# Patient Record
Sex: Female | Born: 1945 | Race: White | Hispanic: No | Marital: Married | Smoking: Former smoker
Health system: Southern US, Community
[De-identification: ages and names within clinical notes are randomized; demographics above are authoritative.]

## PROBLEM LIST (undated history)

## (undated) DIAGNOSIS — E669 Obesity, unspecified: Secondary | ICD-10-CM

## (undated) DIAGNOSIS — G4700 Insomnia, unspecified: Secondary | ICD-10-CM

## (undated) DIAGNOSIS — F32A Depression, unspecified: Secondary | ICD-10-CM

## (undated) DIAGNOSIS — M1990 Unspecified osteoarthritis, unspecified site: Secondary | ICD-10-CM

## (undated) DIAGNOSIS — E785 Hyperlipidemia, unspecified: Secondary | ICD-10-CM

## (undated) DIAGNOSIS — K760 Fatty (change of) liver, not elsewhere classified: Secondary | ICD-10-CM

## (undated) DIAGNOSIS — R011 Cardiac murmur, unspecified: Secondary | ICD-10-CM

## (undated) DIAGNOSIS — K219 Gastro-esophageal reflux disease without esophagitis: Secondary | ICD-10-CM

## (undated) DIAGNOSIS — C679 Malignant neoplasm of bladder, unspecified: Secondary | ICD-10-CM

## (undated) DIAGNOSIS — G2581 Restless legs syndrome: Secondary | ICD-10-CM

## (undated) DIAGNOSIS — R945 Abnormal results of liver function studies: Secondary | ICD-10-CM

## (undated) DIAGNOSIS — F329 Major depressive disorder, single episode, unspecified: Secondary | ICD-10-CM

## (undated) DIAGNOSIS — R51 Headache: Secondary | ICD-10-CM

## (undated) DIAGNOSIS — I10 Essential (primary) hypertension: Secondary | ICD-10-CM

## (undated) DIAGNOSIS — M8580 Other specified disorders of bone density and structure, unspecified site: Secondary | ICD-10-CM

## (undated) DIAGNOSIS — N200 Calculus of kidney: Secondary | ICD-10-CM

## (undated) DIAGNOSIS — F419 Anxiety disorder, unspecified: Secondary | ICD-10-CM

## (undated) DIAGNOSIS — E039 Hypothyroidism, unspecified: Secondary | ICD-10-CM

## (undated) DIAGNOSIS — R7989 Other specified abnormal findings of blood chemistry: Secondary | ICD-10-CM

## (undated) DIAGNOSIS — K635 Polyp of colon: Secondary | ICD-10-CM

## (undated) DIAGNOSIS — I4891 Unspecified atrial fibrillation: Secondary | ICD-10-CM

## (undated) HISTORY — DX: Depression, unspecified: F32.A

## (undated) HISTORY — DX: Major depressive disorder, single episode, unspecified: F32.9

## (undated) HISTORY — DX: Unspecified atrial fibrillation: I48.91

## (undated) HISTORY — PX: APPENDECTOMY: SHX54

## (undated) HISTORY — DX: Unspecified osteoarthritis, unspecified site: M19.90

## (undated) HISTORY — PX: FOOT SURGERY: SHX648

## (undated) HISTORY — DX: Insomnia, unspecified: G47.00

## (undated) HISTORY — DX: Gastro-esophageal reflux disease without esophagitis: K21.9

## (undated) HISTORY — DX: Obesity, unspecified: E66.9

## (undated) HISTORY — DX: Fatty (change of) liver, not elsewhere classified: K76.0

## (undated) HISTORY — DX: Essential (primary) hypertension: I10

## (undated) HISTORY — DX: Hypothyroidism, unspecified: E03.9

## (undated) HISTORY — DX: Other specified disorders of bone density and structure, unspecified site: M85.80

## (undated) HISTORY — DX: Restless legs syndrome: G25.81

## (undated) HISTORY — DX: Calculus of kidney: N20.0

## (undated) HISTORY — DX: Malignant neoplasm of bladder, unspecified: C67.9

## (undated) HISTORY — DX: Cardiac murmur, unspecified: R01.1

## (undated) HISTORY — PX: COLONOSCOPY: SHX174

## (undated) HISTORY — DX: Abnormal results of liver function studies: R94.5

## (undated) HISTORY — DX: Anxiety disorder, unspecified: F41.9

## (undated) HISTORY — DX: Hyperlipidemia, unspecified: E78.5

## (undated) HISTORY — DX: Other specified abnormal findings of blood chemistry: R79.89

---

## 1983-03-19 HISTORY — PX: TOTAL ABDOMINAL HYSTERECTOMY: SHX209

## 1998-03-18 DIAGNOSIS — C679 Malignant neoplasm of bladder, unspecified: Secondary | ICD-10-CM

## 1998-03-18 HISTORY — DX: Malignant neoplasm of bladder, unspecified: C67.9

## 1998-12-21 ENCOUNTER — Encounter: Admission: RE | Admit: 1998-12-21 | Discharge: 1998-12-26 | Payer: Self-pay | Admitting: Family Medicine

## 1999-01-16 ENCOUNTER — Other Ambulatory Visit: Admission: RE | Admit: 1999-01-16 | Discharge: 1999-01-16 | Payer: Self-pay | Admitting: Obstetrics and Gynecology

## 2000-01-21 ENCOUNTER — Other Ambulatory Visit: Admission: RE | Admit: 2000-01-21 | Discharge: 2000-01-21 | Payer: Self-pay | Admitting: Obstetrics and Gynecology

## 2000-09-01 ENCOUNTER — Ambulatory Visit (HOSPITAL_COMMUNITY): Admission: RE | Admit: 2000-09-01 | Discharge: 2000-09-01 | Payer: Self-pay | Admitting: Gastroenterology

## 2000-11-25 ENCOUNTER — Emergency Department (HOSPITAL_COMMUNITY): Admission: EM | Admit: 2000-11-25 | Discharge: 2000-11-25 | Payer: Self-pay | Admitting: Emergency Medicine

## 2001-02-17 ENCOUNTER — Other Ambulatory Visit: Admission: RE | Admit: 2001-02-17 | Discharge: 2001-02-17 | Payer: Self-pay | Admitting: Obstetrics and Gynecology

## 2002-03-23 ENCOUNTER — Other Ambulatory Visit: Admission: RE | Admit: 2002-03-23 | Discharge: 2002-03-23 | Payer: Self-pay | Admitting: Obstetrics and Gynecology

## 2002-10-04 ENCOUNTER — Inpatient Hospital Stay (HOSPITAL_COMMUNITY): Admission: AD | Admit: 2002-10-04 | Discharge: 2002-10-07 | Payer: Self-pay | Admitting: *Deleted

## 2003-04-08 ENCOUNTER — Other Ambulatory Visit: Admission: RE | Admit: 2003-04-08 | Discharge: 2003-04-08 | Payer: Self-pay | Admitting: Obstetrics and Gynecology

## 2003-10-17 ENCOUNTER — Ambulatory Visit (HOSPITAL_BASED_OUTPATIENT_CLINIC_OR_DEPARTMENT_OTHER): Admission: RE | Admit: 2003-10-17 | Discharge: 2003-10-17 | Payer: Self-pay | Admitting: Urology

## 2003-10-17 ENCOUNTER — Encounter (INDEPENDENT_AMBULATORY_CARE_PROVIDER_SITE_OTHER): Payer: Self-pay | Admitting: *Deleted

## 2003-10-17 ENCOUNTER — Observation Stay (HOSPITAL_COMMUNITY): Admission: AD | Admit: 2003-10-17 | Discharge: 2003-10-18 | Payer: Self-pay | Admitting: Urology

## 2003-10-17 HISTORY — PX: OTHER SURGICAL HISTORY: SHX169

## 2004-07-02 ENCOUNTER — Other Ambulatory Visit: Admission: RE | Admit: 2004-07-02 | Discharge: 2004-07-02 | Payer: Self-pay | Admitting: Obstetrics and Gynecology

## 2005-01-23 ENCOUNTER — Ambulatory Visit (HOSPITAL_COMMUNITY): Admission: RE | Admit: 2005-01-23 | Discharge: 2005-01-23 | Payer: Self-pay | Admitting: *Deleted

## 2009-01-10 ENCOUNTER — Encounter: Payer: Self-pay | Admitting: Internal Medicine

## 2009-01-31 ENCOUNTER — Encounter: Payer: Self-pay | Admitting: Internal Medicine

## 2009-01-31 DIAGNOSIS — G2581 Restless legs syndrome: Secondary | ICD-10-CM | POA: Insufficient documentation

## 2009-01-31 DIAGNOSIS — G4700 Insomnia, unspecified: Secondary | ICD-10-CM | POA: Insufficient documentation

## 2009-01-31 DIAGNOSIS — F411 Generalized anxiety disorder: Secondary | ICD-10-CM | POA: Insufficient documentation

## 2009-01-31 DIAGNOSIS — Z862 Personal history of diseases of the blood and blood-forming organs and certain disorders involving the immune mechanism: Secondary | ICD-10-CM | POA: Insufficient documentation

## 2009-01-31 DIAGNOSIS — E669 Obesity, unspecified: Secondary | ICD-10-CM | POA: Insufficient documentation

## 2009-01-31 DIAGNOSIS — F330 Major depressive disorder, recurrent, mild: Secondary | ICD-10-CM | POA: Insufficient documentation

## 2009-01-31 DIAGNOSIS — Z87442 Personal history of urinary calculi: Secondary | ICD-10-CM

## 2009-01-31 DIAGNOSIS — I10 Essential (primary) hypertension: Secondary | ICD-10-CM | POA: Insufficient documentation

## 2009-01-31 DIAGNOSIS — M8580 Other specified disorders of bone density and structure, unspecified site: Secondary | ICD-10-CM | POA: Insufficient documentation

## 2009-01-31 DIAGNOSIS — K219 Gastro-esophageal reflux disease without esophagitis: Secondary | ICD-10-CM | POA: Insufficient documentation

## 2009-01-31 DIAGNOSIS — Z780 Asymptomatic menopausal state: Secondary | ICD-10-CM | POA: Insufficient documentation

## 2009-01-31 DIAGNOSIS — M1990 Unspecified osteoarthritis, unspecified site: Secondary | ICD-10-CM | POA: Insufficient documentation

## 2009-01-31 DIAGNOSIS — E782 Mixed hyperlipidemia: Secondary | ICD-10-CM | POA: Insufficient documentation

## 2009-01-31 DIAGNOSIS — F329 Major depressive disorder, single episode, unspecified: Secondary | ICD-10-CM

## 2009-01-31 DIAGNOSIS — Z87448 Personal history of other diseases of urinary system: Secondary | ICD-10-CM | POA: Insufficient documentation

## 2009-01-31 DIAGNOSIS — E039 Hypothyroidism, unspecified: Secondary | ICD-10-CM | POA: Insufficient documentation

## 2009-01-31 DIAGNOSIS — Z8639 Personal history of other endocrine, nutritional and metabolic disease: Secondary | ICD-10-CM

## 2009-01-31 HISTORY — DX: Personal history of urinary calculi: Z87.442

## 2009-02-01 ENCOUNTER — Ambulatory Visit: Payer: Self-pay | Admitting: Internal Medicine

## 2009-02-01 DIAGNOSIS — I498 Other specified cardiac arrhythmias: Secondary | ICD-10-CM | POA: Insufficient documentation

## 2009-05-08 ENCOUNTER — Ambulatory Visit: Payer: Self-pay | Admitting: Internal Medicine

## 2009-05-26 ENCOUNTER — Ambulatory Visit: Payer: Self-pay | Admitting: Cardiology

## 2009-05-26 ENCOUNTER — Encounter (INDEPENDENT_AMBULATORY_CARE_PROVIDER_SITE_OTHER): Payer: Self-pay | Admitting: Cardiology

## 2009-05-26 LAB — CONVERTED CEMR LAB: POC INR: 3.5

## 2009-06-16 ENCOUNTER — Ambulatory Visit: Payer: Self-pay | Admitting: Cardiovascular Disease

## 2009-06-16 LAB — CONVERTED CEMR LAB: POC INR: 2.9

## 2009-06-28 ENCOUNTER — Ambulatory Visit: Payer: Self-pay | Admitting: Internal Medicine

## 2009-06-28 DIAGNOSIS — I4891 Unspecified atrial fibrillation: Secondary | ICD-10-CM | POA: Insufficient documentation

## 2009-07-14 ENCOUNTER — Ambulatory Visit (HOSPITAL_COMMUNITY): Admission: RE | Admit: 2009-07-14 | Discharge: 2009-07-14 | Payer: Self-pay | Admitting: Internal Medicine

## 2009-07-14 ENCOUNTER — Ambulatory Visit: Payer: Self-pay | Admitting: Cardiology

## 2009-07-14 ENCOUNTER — Ambulatory Visit: Payer: Self-pay | Admitting: Internal Medicine

## 2009-07-14 ENCOUNTER — Encounter: Payer: Self-pay | Admitting: Internal Medicine

## 2009-07-14 ENCOUNTER — Ambulatory Visit: Payer: Self-pay

## 2009-07-14 LAB — CONVERTED CEMR LAB: POC INR: 3

## 2009-07-31 ENCOUNTER — Ambulatory Visit: Payer: Self-pay | Admitting: Cardiovascular Disease

## 2009-07-31 LAB — CONVERTED CEMR LAB: POC INR: 3.1

## 2009-08-18 ENCOUNTER — Ambulatory Visit: Payer: Self-pay | Admitting: Cardiovascular Disease

## 2009-09-04 ENCOUNTER — Ambulatory Visit: Payer: Self-pay | Admitting: Cardiology

## 2009-09-04 ENCOUNTER — Ambulatory Visit: Payer: Self-pay | Admitting: Internal Medicine

## 2009-09-04 LAB — CONVERTED CEMR LAB: POC INR: 2.7

## 2009-10-02 ENCOUNTER — Ambulatory Visit: Payer: Self-pay | Admitting: Cardiology

## 2009-10-02 LAB — CONVERTED CEMR LAB: POC INR: 2.5

## 2009-10-30 ENCOUNTER — Ambulatory Visit: Payer: Self-pay | Admitting: Cardiology

## 2009-10-30 LAB — CONVERTED CEMR LAB: POC INR: 3.3

## 2009-11-21 ENCOUNTER — Ambulatory Visit: Payer: Self-pay | Admitting: Cardiovascular Disease

## 2009-11-21 LAB — CONVERTED CEMR LAB: POC INR: 3.3

## 2009-12-08 ENCOUNTER — Ambulatory Visit: Payer: Self-pay | Admitting: Internal Medicine

## 2009-12-08 LAB — CONVERTED CEMR LAB: POC INR: 2.7

## 2009-12-29 ENCOUNTER — Ambulatory Visit: Payer: Self-pay | Admitting: Cardiology

## 2009-12-29 LAB — CONVERTED CEMR LAB: POC INR: 2.9

## 2010-01-26 ENCOUNTER — Ambulatory Visit: Payer: Self-pay | Admitting: Cardiology

## 2010-01-26 LAB — CONVERTED CEMR LAB: POC INR: 2.1

## 2010-02-21 ENCOUNTER — Telehealth: Payer: Self-pay | Admitting: Internal Medicine

## 2010-02-23 ENCOUNTER — Ambulatory Visit: Payer: Self-pay | Admitting: Internal Medicine

## 2010-03-16 ENCOUNTER — Ambulatory Visit: Payer: Self-pay | Admitting: Cardiovascular Disease

## 2010-03-16 LAB — CONVERTED CEMR LAB: POC INR: 2.6

## 2010-03-18 HISTORY — PX: CARDIAC CATHETERIZATION: SHX172

## 2010-03-23 ENCOUNTER — Encounter: Payer: Self-pay | Admitting: Internal Medicine

## 2010-03-23 ENCOUNTER — Ambulatory Visit
Admission: RE | Admit: 2010-03-23 | Discharge: 2010-03-23 | Payer: Self-pay | Source: Home / Self Care | Attending: Internal Medicine | Admitting: Internal Medicine

## 2010-03-23 DIAGNOSIS — R079 Chest pain, unspecified: Secondary | ICD-10-CM | POA: Insufficient documentation

## 2010-03-23 LAB — CONVERTED CEMR LAB
BUN: 19 mg/dL (ref 6–23)
Basophils Absolute: 0 10*3/uL (ref 0.0–0.1)
Basophils Relative: 0 % (ref 0–1)
CO2: 29 meq/L (ref 19–32)
Calcium: 10.3 mg/dL (ref 8.4–10.5)
Chloride: 104 meq/L (ref 96–112)
Creatinine, Ser: 1.03 mg/dL (ref 0.40–1.20)
Eosinophils Absolute: 0.1 10*3/uL (ref 0.0–0.7)
Eosinophils Relative: 1 % (ref 0–5)
Glucose, Bld: 109 mg/dL — ABNORMAL HIGH (ref 70–99)
HCT: 41.9 % (ref 36.0–46.0)
Hemoglobin: 14 g/dL (ref 12.0–15.0)
INR: 2.3 — ABNORMAL HIGH (ref <1.50)
Lymphocytes Relative: 36 % (ref 12–46)
Lymphs Abs: 2.6 10*3/uL (ref 0.7–4.0)
MCHC: 33.4 g/dL (ref 30.0–36.0)
MCV: 83.1 fL (ref 78.0–100.0)
Monocytes Absolute: 0.7 10*3/uL (ref 0.1–1.0)
Monocytes Relative: 9 % (ref 3–12)
Neutro Abs: 3.8 10*3/uL (ref 1.7–7.7)
Neutrophils Relative %: 53 % (ref 43–77)
Platelets: 231 10*3/uL (ref 150–400)
Potassium: 4.1 meq/L (ref 3.5–5.3)
Prothrombin Time: 25.4 s — ABNORMAL HIGH (ref 11.6–15.2)
RBC: 5.04 M/uL (ref 3.87–5.11)
RDW: 14 % (ref 11.5–15.5)
Sodium: 142 meq/L (ref 135–145)
WBC: 7.2 10*3/uL (ref 4.0–10.5)
aPTT: 39 s — ABNORMAL HIGH (ref 24–37)

## 2010-03-26 ENCOUNTER — Ambulatory Visit
Admission: RE | Admit: 2010-03-26 | Discharge: 2010-03-26 | Payer: Self-pay | Source: Home / Self Care | Attending: Cardiology | Admitting: Cardiology

## 2010-04-02 ENCOUNTER — Ambulatory Visit: Admission: RE | Admit: 2010-04-02 | Discharge: 2010-04-02 | Payer: Self-pay | Source: Home / Self Care

## 2010-04-02 LAB — PROTIME-INR
INR: 1.08 (ref 0.00–1.49)
Prothrombin Time: 14.2 seconds (ref 11.6–15.2)

## 2010-04-02 LAB — CONVERTED CEMR LAB: POC INR: 1.6

## 2010-04-17 NOTE — Progress Notes (Signed)
Summary: chest pains  Phone Note Call from Patient Call back at Home Phone [phone]   Caller: Patient Summary of Call: chest pains Initial call taken by: Judie Grieve,  February 21, 2010 9:37 AM  Follow-up for Phone Call        Jennifer House calls today with chest discomfort radiating from the epigastric region to the neck and jaw extending into her back. At that time the pain was "8-9" out of 10 according to pt.  This occurred on Saturday while in bed.  She did feel short of breath and did notice her heart rate was rapid.  She says this lasted for a "short while" then went away.  She does not have any chest pain, angina, or sob at this time.  She understands to call 911 if this occurs again as she is having this more frequently according to patient.  She does not want to be seen today by PA. She only wished to make appt sometime with Dr. Johney Frame.  She does not feel this is an emergency.  Appt. made 03/23/10 at 3:45pm. Mylo Red RN

## 2010-04-17 NOTE — Medication Information (Signed)
Summary: rov/cb  Anticoagulant Therapy  Managed by: Bethena Midget, RN, BSN PCP: Lanier Ensign Supervising MD: Juanda Chance MD, Malcom Selmer Indication 1: Atrial Fibrillation Lab Used: [facility] [place] Site: [address] INR POC 3.0 INR RANGE 2 - 3  Dietary changes: no    Health status changes: no    Bleeding/hemorrhagic complications: no    Recent/future hospitalizations: no    Any changes in medication regimen? yes       Details: Tikosyn stopped at last MD Visit  Recent/future dental: no  Any missed doses?: no       Is patient compliant with meds? yes       Allergies: 1)  ! Asa 2)  ! * Boniva  Anticoagulation Management History:      The patient is taking warfarin and comes in today for a routine follow up visit.  Positive risk factors for bleeding include an age of 32 years or older.  The bleeding index is 'intermediate risk'.  Positive CHADS2 values include History of HTN.  Negative CHADS2 values include Age > 75 years old.  Anticoagulation responsible provider: Juanda Chance MD, Smitty Cords.  INR POC: 3.0.  Cuvette Lot#: [id number].  Exp: 08/2010.    Anticoagulation Management Assessment/Plan:      The patient's current anticoagulation dose is Warfarin sodium 5 mg tabs: Use as directed by Anticoagulation Clinic.  The next INR is due 07/31/2009.  Anticoagulation instructions were given to patient.  Results were reviewed/authorized by Bethena Midget, RN, BSN.  She was notified by Bethena Midget, RN, BSN.         Prior Anticoagulation Instructions: INR 2.9. Take 1.5 tablets daily except 1 tablet on Mon and Fri.  Current Anticoagulation Instructions: INR 3.0 Change dose 7.5mg  everyday except 5mg s on Mondays, Wednesdays and Fridays. Recheck in 2-3 weeks.

## 2010-04-17 NOTE — Assessment & Plan Note (Signed)
Summary: per check out/sf   Visit Type:  2 mo f/u Referring Provider:  Armanda Magic, MD Primary Provider:  Lanier Ensign  CC:  no cardiac complaints today.  History of Present Illness: The patient presents today for routine electrophysiology followup. She reports doing very well since last being seen in our clinic. She is unaware of episodes of afib.  She has had no further dizziness or presyncope.  The patient denies symptoms of palpitations, chest pain, shortness of breath, orthopnea, PND, lower extremity edema, dizziness, presyncope, syncope, or neurologic sequela. The patient is tolerating medications without difficulties and is otherwise without complaint today.   Current Medications (verified): 1)  Caltrate 600 1500 Mg Tabs (Calcium Carbonate) .... 3 Tablets Daily 2)  Centrum Silver  Tabs (Multiple Vitamins-Minerals) .... Daily 3)  Amlodipine Besy-Benazepril Hcl 10-20 Mg Caps (Amlodipine Besy-Benazepril Hcl) .... One Capsule Once Daily 4)  Pravachol 40 Mg Tabs (Pravastatin Sodium) .... One Tablet At Bedtime 5)  Fish Oil   Oil (Fish Oil) .... 1200mg  2 Tabs Two Times A Day 6)  Synthroid 75 Mcg Tabs (Levothyroxine Sodium) .... Take One Tablet By Mouth Once Daily. 7)  Ambien Cr 12.5 Mg Cr-Tabs (Zolpidem Tartrate) .... One Tablet By Mouth At Bedtime As Needed 8)  Vitamin D 1000 Unit Tabs (Cholecalciferol) .... 2 Tablets Daily 9)  Mobic 7.5 Mg Tabs (Meloxicam) .... Take One Tablet By Mouth Twice Daily. 10)  Warfarin Sodium 5 Mg Tabs (Warfarin Sodium) .... Use As Directed By Anticoagulation Clinic  Allergies: 1)  ! Asa 2)  ! Sandrea Hammond  Past History:  Past Medical History: Reviewed history from 02/01/2009 and no changes required. 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 10/17/2003  Cystoscopy, left stent placement, TURBT. TAH 1985 Foot surgery  Social History: Reviewed history from 02/01/2009 and no changes required. Lives in [place] with husband.    She is a housewife.   She denies tobacco, ETOH, drugs  Vital Signs:  Patient profile:   65 year old female Height:      64 inches Weight:      190 pounds BMI:     32.73 Pulse rate:   76 / minute Pulse rhythm:   regular BP sitting:   122 / 60  (left arm) Cuff size:   regular  Vitals Entered By: Danielle Rankin, CMA (September 04, 2009 8:56 AM)  Physical Exam  General:  Well developed, well nourished, in no acute distress. Head:  normocephalic and atraumatic Eyes:  PERRLA/EOM intact; conjunctiva and lids normal. Mouth:  Teeth, gums and palate normal. Oral mucosa normal. Neck:  Neck supple, no JVD. No masses, thyromegaly or abnormal cervical nodes. Lungs:  Clear bilaterally to auscultation and percussion. Heart:  iRRR, no m/r/g Abdomen:  Bowel sounds positive; abdomen soft and non-tender without masses, organomegaly, or hernias noted. No hepatosplenomegaly. Msk:  Back normal, normal gait. Muscle strength and tone normal. Pulses:  pulses normal in all 4 extremities Extremities:  No clubbing or cyanosis. Neurologic:  Alert and oriented x 3.   Impression & Recommendations:  Problem # 1:  ATRIAL FIBRILLATION (ICD-427.31) permanent afib,  rate controlled and asymptomatic she is appropriatley anticoagulated with coumadin  I have recommended that she avoid mobic if possible due to increased risks for bleeding, renal failure, and worsening htn with this medicine.  Problem # 2:   HYPERTENSION (ICD-401.9)  stable  Her updated medication list for this problem includes:    Amlodipine Besy-benazepril Hcl 10-20 Mg Caps (Amlodipine besy-benazepril hcl) ..... One capsule once daily  Patient Instructions: 1)  Your physician recommends that you schedule a follow-up appointment in: 12 months with Dr Johney Frame

## 2010-04-17 NOTE — Medication Information (Signed)
Summary: new coumadin eval was followed by [facility] physician  Anticoagulant Therapy  Managed by: Shelby Dubin, PharmD, BCPS, CPP PCP: Lanier Ensign Supervising MD: Antoine Poche MD, Fayrene Fearing Indication 1: Atrial Fibrillation INR POC 3.5 INR RANGE 2 - 3  Dietary changes: no    Health status changes: no    Bleeding/hemorrhagic complications: no    Recent/future hospitalizations: no    Any changes in medication regimen? no    Recent/future dental: no  Any missed doses?: no       Is patient compliant with meds? yes       Allergies (verified): 1)  ! Asa 2)  ! * Boniva  Anticoagulation Management History:      The patient is taking warfarin and comes in today for a routine follow up visit.  Positive risk factors for bleeding include an age of 67 years or older.  The bleeding index is 'intermediate risk'.  Positive CHADS2 values include History of HTN.  Negative CHADS2 values include Age > 67 years old.  Anticoagulation responsible provider: Antoine Poche MD, Fayrene Fearing.  INR POC: 3.5.  Cuvette Lot#: [id number].  Exp: 07/2010.    Anticoagulation Management Assessment/Plan:      The patient's current anticoagulation dose is Warfarin sodium 5 mg tabs: Use as directed by Anticoagulation Clinic.  The next INR is due 06/16/2009.  Anticoagulation instructions were given to patient.  Results were reviewed/authorized by Shelby Dubin, PharmD, BCPS, CPP.  She was notified by Shelby Dubin PharmD, BCPS, CPP.         Current Anticoagulation Instructions: INR 3.5  Skip 1 day then resume 1.5 tabs daily except 1 tab each Monday and Friday.  Recheck in 3 weeks.  Please call [phone] with questions.

## 2010-04-17 NOTE — Medication Information (Signed)
Summary: rov/cb  Anticoagulant Therapy  Managed by: Cloyde Reams, RN, BSN PCP: Lanier Ensign Supervising MD: Shirlee Latch MD, Ericca Labra Indication 1: Atrial Fibrillation Lab Used: [facility] [place] Site: [address] INR POC 2.7 INR RANGE 2 - 3  Dietary changes: no    Health status changes: no    Bleeding/hemorrhagic complications: no    Recent/future hospitalizations: no    Any changes in medication regimen? no    Recent/future dental: no  Any missed doses?: no       Is patient compliant with meds? yes       Allergies: 1)  ! Asa 2)  ! * Boniva  Anticoagulation Management History:      The patient is taking warfarin and comes in today for a routine follow up visit.  Positive risk factors for bleeding include an age of 67 years or older.  The bleeding index is 'intermediate risk'.  Positive CHADS2 values include History of HTN.  Negative CHADS2 values include Age > 61 years old.  Anticoagulation responsible provider: Shirlee Latch MD, Lillyauna Jenkinson.  INR POC: 2.7.  Cuvette Lot#: [id number].  Exp: 11/2010.    Anticoagulation Management Assessment/Plan:      The patient's current anticoagulation dose is Warfarin sodium 5 mg tabs: Use as directed by Anticoagulation Clinic.  The target INR is 2.0-3.0.  The next INR is due 10/02/2009.  Anticoagulation instructions were given to patient.  Results were reviewed/authorized by Cloyde Reams, RN, BSN.  She was notified by Cloyde Reams RN.         Prior Anticoagulation Instructions: INR 2.8. Take 1 tablet daily except 1.5 tablets on Tues, Thurs, Sun.  Recheck in 3 weeks.  Current Anticoagulation Instructions: INR 2.7  Continue on same dosage 1 tablet daily except 1.5 tablets on Sundays, Tuesdays, and Thursdays.  Recheck in 4 weeks.

## 2010-04-17 NOTE — Medication Information (Signed)
Summary: rov/ewj  Anticoagulant Therapy  Managed by: Weston Brass, PharmD PCP: Lanier Ensign Supervising MD: Antoine Poche MD, Fayrene Fearing Indication 1: Atrial Fibrillation Lab Used: [facility] [place] Site: [address] INR POC 2.5 INR RANGE 2 - 3  Dietary changes: no    Health status changes: no    Bleeding/hemorrhagic complications: no    Recent/future hospitalizations: no    Any changes in medication regimen? no    Recent/future dental: no  Any missed doses?: no       Is patient compliant with meds? yes       Allergies: 1)  ! Asa 2)  ! * Boniva  Anticoagulation Management History:      The patient is taking warfarin and comes in today for a routine follow up visit.  Positive risk factors for bleeding include an age of 65 years or older.  The bleeding index is 'intermediate risk'.  Positive CHADS2 values include History of HTN.  Negative CHADS2 values include Age > 25 years old.  Anticoagulation responsible provider: Antoine Poche MD, Fayrene Fearing.  INR POC: 2.5.  Cuvette Lot#: [id number].  Exp: 12/2010.    Anticoagulation Management Assessment/Plan:      The patient's current anticoagulation dose is Warfarin sodium 5 mg tabs: Use as directed by Anticoagulation Clinic.  The target INR is 2.0-3.0.  The next INR is due 10/30/2009.  Anticoagulation instructions were given to patient.  Results were reviewed/authorized by Weston Brass, PharmD.  She was notified by Dillard Cannon.         Prior Anticoagulation Instructions: INR 2.7  Continue on same dosage 1 tablet daily except 1.5 tablets on Sundays, Tuesdays, and Thursdays.  Recheck in 4 weeks.    Current Anticoagulation Instructions: INR 2.5  Continue same dose of 1.5 tabs on Sunday, Tuesday, and Thursday and 1 tab all other days.  Re-check in 4 weeks.

## 2010-04-17 NOTE — Medication Information (Signed)
Summary: rov/jm  Anticoagulant Therapy  Managed by: Weston Brass, PharmD PCP: Lanier Ensign Supervising MD: Tenny Craw MD, Gunnar Fusi Indication 1: Atrial Fibrillation Lab Used: [facility] [place] Site: [address] INR POC 2.7 INR RANGE 2 - 3  Dietary changes: no    Health status changes: no    Bleeding/hemorrhagic complications: no    Recent/future hospitalizations: no    Any changes in medication regimen? no    Recent/future dental: no  Any missed doses?: no       Is patient compliant with meds? yes       Allergies: 1)  ! Asa 2)  ! * Boniva  Anticoagulation Management History:      The patient is taking warfarin and comes in today for a routine follow up visit.  Positive risk factors for bleeding include an age of 67 years or older.  The bleeding index is 'intermediate risk'.  Positive CHADS2 values include History of HTN.  Negative CHADS2 values include Age > 67 years old.  Anticoagulation responsible provider: Tenny Craw MD, Gunnar Fusi.  INR POC: 2.7.  Cuvette Lot#: [id number].  Exp: 01/2011.    Anticoagulation Management Assessment/Plan:      The patient's current anticoagulation dose is Warfarin sodium 5 mg tabs: Use as directed by Anticoagulation Clinic.  The target INR is 2.0-3.0.  The next INR is due 12/29/2009.  Anticoagulation instructions were given to patient.  Results were reviewed/authorized by Weston Brass, PharmD.  She was notified by Kennieth Francois.         Prior Anticoagulation Instructions: INR 3.3  Hold today's dose, then take one tablet every day except one and one-half tablets on Sunday and Thursday.  We will see you in two weeks.  Current Anticoagulation Instructions: INR 2.7  Continue taking one tablet every day except for one and one-half tablets on Sunday and Thursday.  We will see you in three weeks.

## 2010-04-17 NOTE — Medication Information (Signed)
Summary: rov/jk  Anticoagulant Therapy  Managed by: Leota Sauers, PharmD, BCPS, CPP PCP: Lanier Ensign Supervising MD: Eden Emms MD, Theron Arista Indication 1: Atrial Fibrillation Lab Used: [facility] [place] Site: [address] INR POC 3.3 INR RANGE 2 - 3  Dietary changes: no    Health status changes: no    Bleeding/hemorrhagic complications: no    Recent/future hospitalizations: no    Any changes in medication regimen? no    Recent/future dental: no  Any missed doses?: no       Is patient compliant with meds? yes       Allergies: 1)  ! Asa 2)  ! * Boniva  Anticoagulation Management History:      Positive risk factors for bleeding include an age of 67 years or older.  The bleeding index is 'intermediate risk'.  Positive CHADS2 values include History of HTN.  Negative CHADS2 values include Age > 33 years old.  Anticoagulation responsible provider: Eden Emms MD, Theron Arista.  INR POC: 3.3.  Cuvette Lot#: [id number].  Exp: 12/2010.    Anticoagulation Management Assessment/Plan:      The patient's current anticoagulation dose is Warfarin sodium 5 mg tabs: Use as directed by Anticoagulation Clinic.  The target INR is 2.0-3.0.  The next INR is due 12/08/2009.  Anticoagulation instructions were given to patient.  Results were reviewed/authorized by Leota Sauers, PharmD, BCPS, CPP.  She was notified by Kennieth Francois.         Prior Anticoagulation Instructions: INR 3.3  Skip tonight's dose.  Then resume regular schedule of taking 1 tablet (5mg ) every day except take 1.5 tablets (7.5mg ) on Sundays, Tuesdays, and Thursdays.  Recheck in 3 weeks.   Current Anticoagulation Instructions: INR 3.3  Hold today's dose, then take one tablet every day except one and one-half tablets on Sunday and Thursday.  We will see you in two weeks.

## 2010-04-17 NOTE — Medication Information (Signed)
Summary: rov/jm  Anticoagulant Therapy  Managed by: Reina Fuse, PharmD PCP: Lanier Ensign Supervising MD: Riley Kill MD, Maisie Fus Indication 1: Atrial Fibrillation Lab Used: [facility] [place] Site: [address] INR POC 2.9 INR RANGE 2 - 3  Dietary changes: no    Health status changes: no    Bleeding/hemorrhagic complications: no    Recent/future hospitalizations: no    Any changes in medication regimen? no    Recent/future dental: no  Any missed doses?: no       Is patient compliant with meds? yes       Current Medications (verified): 1)  Caltrate 600 1500 Mg Tabs (Calcium Carbonate) .... 3 Tablets Daily 2)  Centrum Silver  Tabs (Multiple Vitamins-Minerals) .... Daily 3)  Amlodipine Besy-Benazepril Hcl 10-20 Mg Caps (Amlodipine Besy-Benazepril Hcl) .... One Capsule Once Daily 4)  Pravachol 40 Mg Tabs (Pravastatin Sodium) .... One Tablet At Bedtime 5)  Fish Oil   Oil (Fish Oil) .... 1200mg  2 Tabs Two Times A Day 6)  Synthroid 75 Mcg Tabs (Levothyroxine Sodium) .... Take One Tablet By Mouth Once Daily. 7)  Ambien Cr 12.5 Mg Cr-Tabs (Zolpidem Tartrate) .... One Tablet By Mouth At Bedtime As Needed 8)  Vitamin D 1000 Unit Tabs (Cholecalciferol) .... 2 Tablets Daily 9)  Mobic 7.5 Mg Tabs (Meloxicam) .... Take One Tablet By Mouth Twice Daily. 10)  Warfarin Sodium 5 Mg Tabs (Warfarin Sodium) .... Use As Directed By Anticoagulation Clinic  Allergies (verified): 1)  ! Asa 2)  ! * Boniva  Anticoagulation Management History:      The patient is taking warfarin and comes in today for a routine follow up visit.  Positive risk factors for bleeding include an age of 67 years or older.  The bleeding index is 'intermediate risk'.  Positive CHADS2 values include History of HTN.  Negative CHADS2 values include Age > 82 years old.  Anticoagulation responsible provider: Riley Kill MD, Maisie Fus.  INR POC: 2.9.  Cuvette Lot#: [id number].  Exp: 01/2011.    Anticoagulation Management  Assessment/Plan:      The patient's current anticoagulation dose is Warfarin sodium 5 mg tabs: Use as directed by Anticoagulation Clinic.  The target INR is 2.0-3.0.  The next INR is due 01/26/2010.  Anticoagulation instructions were given to patient.  Results were reviewed/authorized by Reina Fuse, PharmD.  She was notified by Reina Fuse PharmD.         Prior Anticoagulation Instructions: INR 2.7  Continue taking one tablet every day except for one and one-half tablets on Sunday and Thursday.  We will see you in three weeks.    Current Anticoagulation Instructions: INR 2.9  Continue taking Coumadin 5 mg (1 tab) on Mon, Tues, Wed, Fri, Sat and Coumadin 7.5 mg (1.5 tab) on Sundays and Thursdays. Return to clinic in 4 weeks.

## 2010-04-17 NOTE — Medication Information (Signed)
Summary: rov/cb  Anticoagulant Therapy  Managed by: Elaina Pattee, PharmD PCP: Lanier Ensign Supervising MD: Excell Seltzer MD, Casimiro Needle Indication 1: Atrial Fibrillation Lab Used: [facility] [place] Site: [address] INR RANGE 2 - 3  Dietary changes: no    Health status changes: no    Bleeding/hemorrhagic complications: no    Recent/future hospitalizations: no    Any changes in medication regimen? no    Recent/future dental: no  Any missed doses?: no       Is patient compliant with meds? yes       Allergies: 1)  ! Asa 2)  ! * Boniva  Anticoagulation Management History:      The patient is taking warfarin and comes in today for a routine follow up visit.  Positive risk factors for bleeding include an age of 67 years or older.  The bleeding index is 'intermediate risk'.  Positive CHADS2 values include History of HTN.  Negative CHADS2 values include Age > 75 years old.  Anticoagulation responsible provider: Excell Seltzer MD, Casimiro Needle.  Cuvette Lot#: [id number].  Exp: 10/2010.    Anticoagulation Management Assessment/Plan:      The patient's current anticoagulation dose is Warfarin sodium 5 mg tabs: Use as directed by Anticoagulation Clinic.  The next INR is due 09/04/2009.  Anticoagulation instructions were given to patient.  Results were reviewed/authorized by Elaina Pattee, PharmD.  She was notified by Elaina Pattee, PharmD.         Prior Anticoagulation Instructions: INR-3.1 Take 1/2 a tablet today. Patient to take 1 tablet Monday, Wednesday , Friday and Saturday. Take 1.5 tablets on all other days of each week. Return in 2 weeks.   Current Anticoagulation Instructions: INR 2.8. Take 1 tablet daily except 1.5 tablets on Tues, Thurs, Sun.  Recheck in 3 weeks.

## 2010-04-17 NOTE — Medication Information (Signed)
Summary: rov/sl  Anticoagulant Therapy  Managed by: Weston Brass, PharmD PCP: Lanier Ensign Supervising MD: Jens Som MD, Arlys John Indication 1: Atrial Fibrillation Lab Used: [facility] [place] Site: [address] INR POC 2.1 INR RANGE 2 - 3  Dietary changes: no    Health status changes: no    Bleeding/hemorrhagic complications: no    Recent/future hospitalizations: no    Any changes in medication regimen? no    Recent/future dental: no  Any missed doses?: no       Is patient compliant with meds? yes       Allergies (verified): 1)  ! Asa 2)  ! * Boniva  Anticoagulation Management History:      The patient is taking warfarin and comes in today for a routine follow up visit.  Positive risk factors for bleeding include an age of 67 years or older.  The bleeding index is 'intermediate risk'.  Positive CHADS2 values include History of HTN.  Negative CHADS2 values include Age > 39 years old.  Anticoagulation responsible provider: Jens Som MD, Arlys John.  INR POC: 2.1.  Cuvette Lot#: [id number].  Exp: 02/2011.    Anticoagulation Management Assessment/Plan:      The patient's current anticoagulation dose is Warfarin sodium 5 mg tabs: Use as directed by Anticoagulation Clinic.  The target INR is 2.0-3.0.  The next INR is due 02/23/2010.  Anticoagulation instructions were given to patient.  Results were reviewed/authorized by Weston Brass, PharmD.  She was notified by Hoy Register, PharmD Candidate.         Prior Anticoagulation Instructions: INR 2.9  Continue taking Coumadin 5 mg (1 tab) on Mon, Tues, Wed, Fri, Sat and Coumadin 7.5 mg (1.5 tab) on Sundays and Thursdays. Return to clinic in 4 weeks.   Current Anticoagulation Instructions: INR 2.1 Continue previous dose 1 tablet everyday except 1.5 tablets on Sunday and Thursday Recheck INR in 4 weeks

## 2010-04-17 NOTE — Assessment & Plan Note (Signed)
Summary: 2 month rov   Visit Type:  Follow-up Referring Provider:  Armanda Magic, MD Primary Provider:  Lanier Ensign   History of Present Illness: The patient presents today for routine electrophysiology followup. She reports doing very well since last being seen in our clinic. She is unaware of episodes of afib.  She has had no further dizziness or presyncope.  The patient denies symptoms of palpitations, chest pain, shortness of breath, orthopnea, PND, lower extremity edema, dizziness, presyncope, syncope, or neurologic sequela. The patient is tolerating medications without difficulties and is otherwise without complaint today.   Current Medications (verified): 1)  Tikosyn 500 Mcg Caps (Dofetilide) .... Take One Tablet By Mouth Once Two Times A Day 2)  Caltrate 600 1500 Mg Tabs (Calcium Carbonate) .... 3 Tablets Daily 3)  Centrum Silver  Tabs (Multiple Vitamins-Minerals) .... Daily 4)  Amlodipine Besy-Benazepril Hcl 10-20 Mg Caps (Amlodipine Besy-Benazepril Hcl) .... One Capsule Once Daily 5)  Pravachol 40 Mg Tabs (Pravastatin Sodium) .... One Tablet At Bedtime 6)  Fish Oil   Oil (Fish Oil) .... 1200mg  2 Tabs Two Times A Day 7)  Synthroid 75 Mcg Tabs (Levothyroxine Sodium) .... Take One Tablet By Mouth Once Daily. 8)  Ambien Cr 12.5 Mg Cr-Tabs (Zolpidem Tartrate) .... One Tablet By Mouth At Bedtime As Needed 9)  Vitamin D 1000 Unit Tabs (Cholecalciferol) .... 2 Tablets Daily 10)  Mobic 7.5 Mg Tabs (Meloxicam) .... Take One Tablet By Mouth Twice Daily. 11)  Warfarin Sodium 5 Mg Tabs (Warfarin Sodium) .... Use As Directed By Anticoagulation Clinic  Allergies: 1)  ! Asa 2)  ! Sandrea Hammond  Past History:  Past Medical History: Reviewed history from 02/01/2009 and no changes required. 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 10/17/2003  Cystoscopy, left stent placement, TURBT. TAH 1985 Foot surgery  Social History: Reviewed history from 02/01/2009 and no changes required. Lives in [place] with husband.    She is a housewife.   She denies tobacco, ETOH, drugs  Review of Systems       All systems are reviewed and negative except as listed in the HPI.   Vital Signs:  Patient profile:   65 year old female Height:      64 inches Weight:      186 pounds BMI:     32.04 Pulse rate:   79 / minute BP sitting:   130 / 70  (left arm)  Vitals Entered By: Laurance Flatten CMA (June 28, 2009 9:26 AM)  Physical Exam  General:  Well developed, well nourished, in no acute distress. Head:  normocephalic and atraumatic Eyes:  PERRLA/EOM intact; conjunctiva and lids normal. Mouth:  Teeth, gums and palate normal. Oral mucosa normal. Neck:  Neck supple, no JVD. No masses, thyromegaly or abnormal cervical nodes. Lungs:  Clear bilaterally to auscultation and percussion. Heart:  iRRR, no m/r/g Abdomen:  Bowel sounds positive; abdomen soft and non-tender without masses, organomegaly, or hernias noted. No hepatosplenomegaly. Msk:  Back normal, normal gait. Muscle strength and tone normal. Pulses:  pulses normal in all 4 extremities Extremities:  No clubbing or cyanosis. Neurologic:  Alert and oriented x 3. Skin:  Intact  without lesions or rashes. Cervical Nodes:  no significant adenopathy Psych:  Normal affect.   EKG  Procedure date:  06/28/2009  Findings:      afib, V rate 70, LAD  Impression & Recommendations:  Problem # 1:  ATRIAL FIBRILLATION  (ICD-427.31) The patient has persistent afib, without symptoms.  Her ventricular rates are controlled and she is on coumadin for stroke prevention. Therapies for afib were again discused at length with her.  SHe is clear that her symptoms are minimal and she does not wish to persue cardioversion or aggressive rhythm control strategies at this time.  I will therefore stop tikosyn today.  She has a h/o increased LFTs and abnl TFTs and would therefore not be a candidate for amiodarone.  I would avoid sotalol or multaq due to prior bradycardia. She will return in 2 months.  We will obtain an echo in the interim.  Problem # 2:  BRADYCARDIA (ICD-427.89) stable, without symptoms she has previously declined pacemaker implantation  Other Orders: Echocardiogram (Echo)  Patient Instructions: 1)  Your physician recommends that you schedule a follow-up appointment in: 2 months with Dr Johney Frame 2)  Your physician has recommended you make the following change in your medication: stop Tikosyn 3)  Your physician has requested that you have an echocardiogram.  Echocardiography is a painless test that uses sound waves to create images of your heart. It provides your doctor with information about the size and shape of your heart and how well your heart's chambers and valves are working.  This procedure takes approximately one hour. There are no restrictions for this procedure.

## 2010-04-17 NOTE — Assessment & Plan Note (Signed)
Summary: 3 month rov/sl   Visit Type:  Follow-up Referring Provider:  Armanda Magic, MD Primary Provider:  Lanier Ensign   History of Present Illness: The patient presents today for routine electrophysiology followup. She reports doing very well since last being seen in our clinic. She is unaware of episodes of afib.  The patient denies symptoms of palpitations, chest pain, shortness of breath, orthopnea, PND, lower extremity edema, dizziness, presyncope, syncope, or neurologic sequela. The patient is tolerating medications without difficulties and is otherwise without complaint today.   Current Medications (verified): 1)  Tikosyn 500 Mcg Caps (Dofetilide) .... Take One Tablet By Mouth Once Two Times A Day 2)  Caltrate 600 1500 Mg Tabs (Calcium Carbonate) .... 3 Tablets Daily 3)  Centrum Silver  Tabs (Multiple Vitamins-Minerals) .... Daily 4)  Amlodipine Besy-Benazepril Hcl 10-20 Mg Caps (Amlodipine Besy-Benazepril Hcl) .... One Capsule Once Daily 5)  Pravachol 40 Mg Tabs (Pravastatin Sodium) .... One Tablet At Bedtime 6)  Fish Oil   Oil (Fish Oil) .... 1200mg  2 Tabs Two Times A Day 7)  Synthroid 75 Mcg Tabs (Levothyroxine Sodium) .... Take One Tablet By Mouth Once Daily. 8)  Ambien Cr 12.5 Mg Cr-Tabs (Zolpidem Tartrate) .... One Tablet By Mouth At Bedtime As Needed 9)  Vitamin D 1000 Unit Tabs (Cholecalciferol) .... 2 Tablets Daily 10)  Mobic 7.5 Mg Tabs (Meloxicam) .... Take One Tablet By Mouth Twice Daily. 11)  Warfarin Sodium 5 Mg Tabs (Warfarin Sodium) .... Use As Directed By Anticoagulation Clinic  Allergies: 1)  ! Asa 2)  ! Sandrea Hammond  Past History:  Past Medical History: Reviewed history from 02/01/2009 and no changes required. 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 10/17/2003  Cystoscopy, left stent placement, TURBT. TAH 1985 Foot surgery  Social History: Reviewed history from 02/01/2009 and no changes required. Lives in [place] with husband.    She is a housewife.   She denies tobacco, ETOH, drugs  Review of Systems       All systems are reviewed and negative except as listed in the HPI.   Vital Signs:  Patient profile:   65 year old female Height:      64 inches Weight:      184 pounds BMI:     31.70 Pulse rate:   86 / minute BP sitting:   130 / 80  (left arm)  Vitals Entered By: Laurance Flatten CMA (May 08, 2009 10:55 AM)  Physical Exam  General:  Well developed, well nourished, in no acute distress. Head:  normocephalic and atraumatic Eyes:  PERRLA/EOM intact; conjunctiva and lids normal. Mouth:  Teeth, gums and palate normal. Oral mucosa normal. Neck:  Neck supple, no JVD. No masses, thyromegaly or abnormal cervical nodes. Lungs:  Clear bilaterally to auscultation and percussion. Heart:  iRRR, no m/r/g Abdomen:  Bowel sounds positive; abdomen soft and non-tender without masses, organomegaly, or hernias noted. No hepatosplenomegaly. Msk:  Back normal, normal gait. Muscle strength and tone normal. Pulses:  pulses normal in all 4 extremities Extremities:  No clubbing or cyanosis. Neurologic:  Alert and oriented x 3. Skin:  Intact without lesions or rashes. Psych:  Normal affect.  EKG  Procedure date:  05/08/2009  Findings:      afib V rate 86 bpm, otherwise normal ekg,  QTc 457  Impression & Recommendations:  Problem # 1:  ATRIAL FIBRILLATION, PAROXYSMAL (ICD-427.31) The patient presents for  follow-up of symptomatic afib and atrial flutter.  Interestingly, she presents today in afib but reports no recent symptoms of afib.  I suspect that she has much more afib than she is aware. She does not want to change antiarrhythmics today and continues to decline ablation.  Given that she feels so well, I think that this is a reasonable plan.  I will see her back in 2 months.  If she continues to be in persistent afib with minimal symptoms, then I will likely stop tikosyn and plan a rate control strategy longterm.  The importance of coumadin longterm was also stressed today. We discussed pradaxa as an alternative to coumadin.  The patient will continue coumadin at this time.  Her updated medication list for this problem includes:    Tikosyn 500 Mcg Caps (Dofetilide) .Marland Kitchen... Take one tablet by mouth once two times a day    Warfarin Sodium 5 Mg Tabs (Warfarin sodium) ..... Use as directed by anticoagulation clinic  Problem # 2:  BRADYCARDIA (ICD-427.89) The patient has denies any further symptoms of sinus bradycardia.  She denies syncope. We discussed treatments including ablation of atrial arrhythmias (as above) and possibly pacemaker implantation.  The patient declines both options today.  She will therefore contact my office if worsening bradycardia symptoms or syncope occur.  Her updated medication list for this problem includes:    Tikosyn 500 Mcg Caps (Dofetilide) .Marland Kitchen... Take one tablet by mouth once two times a day    Amlodipine Besy-benazepril Hcl 10-20 Mg Caps (Amlodipine besy-benazepril hcl) ..... One capsule once daily    Warfarin Sodium 5 Mg Tabs (Warfarin sodium) ..... Use as directed by anticoagulation clinic  Problem # 3:  HYPERTENSION (ICD-401.9) stable no changes  Her updated medication list for this problem includes:    Amlodipine Besy-benazepril Hcl 10-20 Mg Caps (Amlodipine besy-benazepril hcl) ..... One capsule once daily  Other Orders: EKG w/ Interpretation  (93000)  Patient Instructions: 1)  Your physician recommends that you schedule a follow-up appointment in: 2 months with Dr Johney Frame 2)  You have been referred to our [facility] clinic for a-fib is being followed by [facility] now 3)  Pradaxa-look up medication and check on cost  Appended Document: Orders Update    Clinical Lists Changes  Orders: Added new Referral order of [facility]. Coumadin Clinic Referral (Coumadin clinic) - Signed

## 2010-04-17 NOTE — Medication Information (Signed)
Summary: rov/tm  Anticoagulant Therapy  Managed by: Weston Brass, PharmD PCP: Lanier Ensign Supervising MD: Clifton James MD, Cristal Deer Indication 1: Atrial Fibrillation Lab Used: [facility] [place] Site: [address] INR POC 3.1 INR RANGE 2 - 3  Dietary changes: no    Health status changes: no    Bleeding/hemorrhagic complications: no    Recent/future hospitalizations: no    Any changes in medication regimen? no    Recent/future dental: no  Any missed doses?: no       Is patient compliant with meds? yes       Allergies: 1)  ! Asa 2)  ! * Boniva  Anticoagulation Management History:      The patient is taking warfarin and comes in today for a routine follow up visit.  Positive risk factors for bleeding include an age of 82 years or older.  The bleeding index is 'intermediate risk'.  Positive CHADS2 values include History of HTN.  Negative CHADS2 values include Age > 36 years old.  Anticoagulation responsible provider: Clifton James MD, Cristal Deer.  INR POC: 3.1.  Cuvette Lot#: [id number].  Exp: 10/2010.    Anticoagulation Management Assessment/Plan:      The patient's current anticoagulation dose is Warfarin sodium 5 mg tabs: Use as directed by Anticoagulation Clinic.  The next INR is due 08/18/2009.  Anticoagulation instructions were given to patient.  Results were reviewed/authorized by Weston Brass, PharmD.         Prior Anticoagulation Instructions: INR 3.0 Change dose 7.5mg  everyday except 5mg s on Mondays, Wednesdays and Fridays. Recheck in 2-3 weeks.   Current Anticoagulation Instructions: INR-3.1 Take 1/2 a tablet today. Patient to take 1 tablet Monday, Wednesday , Friday and Saturday. Take 1.5 tablets on all other days of each week. Return in 2 weeks.

## 2010-04-17 NOTE — Medication Information (Signed)
Summary: rov.mp  Anticoagulant Therapy  Managed by: Elaina Pattee, PharmD PCP: Lanier Ensign Supervising MD: Eden Emms MD, Theron Arista Indication 1: Atrial Fibrillation INR POC 2.9 INR RANGE 2 - 3  Dietary changes: yes       Details: Has started eating salads (1/2-1 cup) every 2-3 days.  Health status changes: no    Bleeding/hemorrhagic complications: no    Recent/future hospitalizations: no    Any changes in medication regimen? no    Recent/future dental: no  Any missed doses?: no       Is patient compliant with meds? yes       Allergies: 1)  ! Asa 2)  ! * Boniva  Anticoagulation Management History:      The patient is taking warfarin and comes in today for a routine follow up visit.  Positive risk factors for bleeding include an age of 67 years or older.  The bleeding index is 'intermediate risk'.  Positive CHADS2 values include History of HTN.  Negative CHADS2 values include Age > 60 years old.  Anticoagulation responsible provider: Eden Emms MD, Theron Arista.  INR POC: 2.9.  Cuvette Lot#: [id number].  Exp: 07/2010.    Anticoagulation Management Assessment/Plan:      The patient's current anticoagulation dose is Warfarin sodium 5 mg tabs: Use as directed by Anticoagulation Clinic.  The next INR is due 07/14/2009.  Anticoagulation instructions were given to patient.  Results were reviewed/authorized by Elaina Pattee, PharmD.  She was notified by Elaina Pattee, PharmD.         Prior Anticoagulation Instructions: INR 3.5  Skip 1 day then resume 1.5 tabs daily except 1 tab each Monday and Friday.  Recheck in 3 weeks.  Please call [phone] with questions.   Current Anticoagulation Instructions: INR 2.9. Take 1.5 tablets daily except 1 tablet on Mon and Fri.

## 2010-04-17 NOTE — Letter (Signed)
Summary: Handout Printed  Printed Handout:  - Coumadin Instructions-w/out Meds 

## 2010-04-17 NOTE — Medication Information (Signed)
Summary: rov/ln  Anticoagulant Therapy  Managed by: Reina Fuse, PharmD PCP: Lanier Ensign Supervising MD: Shirlee Latch MD, [place] Indication 1: Atrial Fibrillation Lab Used: [facility] [place] Site: [address] INR POC 3.3 INR RANGE 2 - 3  Dietary changes: no    Health status changes: no    Bleeding/hemorrhagic complications: no    Recent/future hospitalizations: no    Any changes in medication regimen? no    Recent/future dental: no  Any missed doses?: no       Is patient compliant with meds? yes       Allergies: 1)  ! Asa 2)  ! * Boniva  Anticoagulation Management History:      Positive risk factors for bleeding include an age of 67 years or older.  The bleeding index is 'intermediate risk'.  Positive CHADS2 values include History of HTN.  Negative CHADS2 values include Age > 35 years old.  Anticoagulation responsible provider: Shirlee Latch MD, [place].  INR POC: 3.3.  Cuvette Lot#: [id number].  Exp: 12/2010.    Anticoagulation Management Assessment/Plan:      The patient's current anticoagulation dose is Warfarin sodium 5 mg tabs: Use as directed by Anticoagulation Clinic.  The target INR is 2.0-3.0.  The next INR is due 11/21/2009.  Anticoagulation instructions were given to patient.  Results were reviewed/authorized by Reina Fuse, PharmD.  She was notified by Gweneth Fritter, PharmD Candidate.         Prior Anticoagulation Instructions: INR 2.5  Continue same dose of 1.5 tabs on Sunday, Tuesday, and Thursday and 1 tab all other days.  Re-check in 4 weeks.  Current Anticoagulation Instructions: INR 3.3  Skip tonight's dose.  Then resume regular schedule of taking 1 tablet (5mg ) every day except take 1.5 tablets (7.5mg ) on Sundays, Tuesdays, and Thursdays.  Recheck in 3 weeks.

## 2010-04-18 ENCOUNTER — Encounter (INDEPENDENT_AMBULATORY_CARE_PROVIDER_SITE_OTHER): Payer: MEDICARE

## 2010-04-18 ENCOUNTER — Ambulatory Visit: Admit: 2010-04-18 | Payer: Self-pay

## 2010-04-18 ENCOUNTER — Ambulatory Visit (INDEPENDENT_AMBULATORY_CARE_PROVIDER_SITE_OTHER): Payer: MEDICARE | Admitting: Internal Medicine

## 2010-04-18 ENCOUNTER — Encounter: Payer: Self-pay | Admitting: Internal Medicine

## 2010-04-18 ENCOUNTER — Ambulatory Visit: Admit: 2010-04-18 | Payer: Self-pay | Admitting: Internal Medicine

## 2010-04-18 DIAGNOSIS — Z7901 Long term (current) use of anticoagulants: Secondary | ICD-10-CM

## 2010-04-18 DIAGNOSIS — I4891 Unspecified atrial fibrillation: Secondary | ICD-10-CM

## 2010-04-18 DIAGNOSIS — I10 Essential (primary) hypertension: Secondary | ICD-10-CM

## 2010-04-18 LAB — CONVERTED CEMR LAB: POC INR: 1.9

## 2010-04-19 NOTE — Letter (Signed)
Summary: Cardiac Catheterization Instructions- [facility]  [facility], Main Office  [address]   [place], [place] [address]   Phone: [phone]  Fax: [phone]     03/23/2010 MRN: [id number]  Natallie Kulak [address] [place], [place]  [address]  Dear Ms. Oldfield,   You are scheduled for a Cardiac Catheterization on January 9,2012 at noon with Dr.Jordan.  Please arrive to the 1st floor of the [facility] Center at [facility] at 11:00 am on the day of your procedure. Please do not arrive before 6:30 a.m. Call the Heart and Vascular Center at [phone] if you are unable to make your appointmnet. The Code to get into the parking garage under the building is 0030. Take the elevators to the 1st floor. You must have someone to drive you home. Someone must be with you for the first 24 hours after you arrive home. Please wear clothes that are easy to get on and off and wear slip-on shoes. You may have clear liquids until 6:00 am the morning of your procedure. Clear liquids include water, broth, Sprite, Ginger Ale, cranberry/apple/grape juice, coffee, tea (no sugar), popsicles made from clear juices.   Bring all your medications and current insurance cards with you.  _x__ DO NOT take these medications before your procedure: _Do not take coumadin this weekend.   _x__ Make sure you take your aspirin. (81mg )  _x__ You may take ALL of your medications with water that morning. ________________________________________________________________________________________________________________________________    The usual length of stay after your procedure is 2 to 3 hours. This can vary.  If you have any questions, please call the office at the number listed above.   Claris Gladden RN

## 2010-04-19 NOTE — Assessment & Plan Note (Signed)
Summary: rov/dfg   Visit Type:  Follow-up Referring Provider:  Armanda Magic, MD Primary Provider:  Lanier Ensign  CC:  C/O CHEST PAIN.  History of Present Illness: The patient presents today for cardiology followup.  She presents today for evaluation of chest pain.  She states that over the past few weeks, that she has had substernal chest pressure.  This discomfort is worse with exertion and improves with resting.  She states that episodes typically last 5-10 minutes and are associated with SOB.  She also reports occasional SOB at rest. She underwent nuclear testing 10/10 which reveals what was felt to be breast artifact.  She denies prior cath or cardiac intervention. She has longstanding atrial fibrillation which she tolerates quite well.  She reports occasional palpitations.  She denies symptoms of orthopnea, PND, lower extremity edema, dizziness, presyncope, syncope, or neurologic sequela. The patient is tolerating medications without difficulties and is otherwise without complaint today.   Current Medications (verified): 1)  Caltrate 600 1500 Mg Tabs (Calcium Carbonate) .... 3 Tablets Daily 2)  Centrum Silver  Tabs (Multiple Vitamins-Minerals) .... Daily 3)  Amlodipine Besy-Benazepril Hcl 10-20 Mg Caps (Amlodipine Besy-Benazepril Hcl) .... One Capsule Once Daily 4)  Pravachol 40 Mg Tabs (Pravastatin Sodium) .... One Tablet At Bedtime 5)  Fish Oil   Oil (Fish Oil) .... 1200mg  2 Tabs Two Times A Day 6)  Synthroid 75 Mcg Tabs (Levothyroxine Sodium) .... Take One Tablet By Mouth Once Daily. 7)  Ambien Cr 12.5 Mg Cr-Tabs (Zolpidem Tartrate) .... One Tablet By Mouth At Bedtime As Needed 8)  Vitamin D 1000 Unit Tabs (Cholecalciferol) .... 2 Tablets Daily 9)  Hydrocodone-Acetaminophen 5-500 Mg Tabs (Hydrocodone-Acetaminophen) .... As Needed 10)  Warfarin Sodium 5 Mg Tabs (Warfarin Sodium) .... Use As Directed By Anticoagulation Clinic  Allergies: 1)  ! 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 10/17/2003  Cystoscopy, left stent placement, TURBT. TAH 1985 Foot surgery  Family History: Reviewed history from 02/01/2009 and no changes required. father died of stroke. Brother died of stroke. Mother had CHF.  Social History: Reviewed history from 02/01/2009 and no changes required. Lives in [place] with husband.    She is a housewife.   She denies tobacco, ETOH, drugs  Review of Systems       All systems are reviewed and negative except as listed in the HPI.   Vital Signs:  Patient profile:   65 year old female Height:      64 inches Weight:      190 pounds Pulse rate:   88 / minute Pulse rhythm:   regular BP sitting:   148 / 64  (left arm) Cuff size:   large  Vitals Entered By: Scherrie Bateman, LPN (March 23, 2010 3:37 PM)  Physical Exam  General:  Well developed, well nourished, in no acute distress. Head:  normocephalic and atraumatic Eyes:  PERRLA/EOM intact; conjunctiva and lids normal. Mouth:  Teeth, gums and palate normal. Oral mucosa normal. Neck:  Neck supple, no JVD. No masses, thyromegaly or abnormal cervical nodes. Lungs:  Clear bilaterally to auscultation and percussion.  Heart:  iRRR, no m/r/g Abdomen:  Bowel sounds positive; abdomen soft and non-tender without masses, organomegaly, or hernias noted. No hepatosplenomegaly. Msk:  Back normal, normal  gait. Muscle strength and tone normal. Extremities:  No clubbing or cyanosis. Neurologic:  Alert and oriented x 3. Skin:  Intact without lesions or rashes. Psych:  Normal affect.   EKG  Procedure date:  03/23/2010  Findings:      afib, V rate 90 bpm, nonspecific ST/T changes  Impression & Recommendations:  Problem # 1:  CHEST PAIN (ICD-786.50) Ms Wachter presents today for further evaluation of exertional chest pain.  Her pain has increased and is concerning for ACS.  Presently, she is pain free.  I have reviewed her myoview from 10/10 (performed by Dr Mayford Knife) which was abnormal due to probable breast attenuation.  Given her abnormal myoview in the past, risk factors of HTN/HL and my high suspicion for CAD, I have recommended heart catheterization at this time.    Risk, benefits, and alternatives to left heart catheterization with possible PCI were also discussed in detail today. These risks include but are not limited to stroke, bleeding, vascular damage, perforation, damage to the cors and other structures, arrhythmia, worsening renal function, and death. The patient understands these risk and wishes to proceed.  We will schedule cath for Monday.   I will stop coumadin today and restart post cath. We will start ASA 81mg  daily in the interm.  I will also start metoprolol 25mg  two times a day.   She has been given slNTG and is instructed to contact EMS (911) if she has persistent chest pain after 2 sl NTGs.  Problem # 2:  ATRIAL FIBRILLATION (ICD-427.31) stable start metoprolol hold coumadin for cath and restart post cath  Problem # 3:  HYPERTENSION (ICD-401.9) stable  Problem # 4:  HYPERLIPIDEMIA, MIXED (ICD-272.2) stable Her updated medication list for this problem includes:    Pravachol 40 Mg Tabs (Pravastatin sodium) ..... One tablet at bedtime  Other Orders: T-Basic Metabolic Panel [phone]) T-CBC w/Diff 502 010 0466) T-PTT [phone]) T-Protime, Auto  [phone])  Patient Instructions: 1)  Your physician has recommended you make the following change in your medication: Hold Coumadin this weekend. Take Asprin 81mg  daily. Metoprolol Tartrate 25mg  twice a day.  Nitroglycerin as needed for chest pain.  2)  Labs today.  3)  Your physician recommends that you schedule a follow-up appointment in: 4 weeks Prescriptions: NITROSTAT 0.4 MG SUBL (NITROGLYCERIN) as directed  #1 x 3   Entered by:   Claris Gladden RN   Authorized by:   Hillis Range, MD   Signed by:   Claris Gladden RN on 03/23/2010   Method used:   Electronically to        [facility] [phone][facility] (retail)       [address] [place], [place]  [address]       Ph: [phone] or [phone]       Fax: [phone]   RxID:   [id number] METOPROLOL TARTRATE 50 MG TABS (METOPROLOL TARTRATE) 1/2 tablet two times a day  #30 x 11   Entered by:   Claris Gladden RN   Authorized by:   Hillis Range, MD   Signed by:   Claris Gladden RN on 03/23/2010   Method used:   [facility]ronically to        [facility]  [address] [id number]  [address]       [place], [place]  [address]       Ph: [phone] or [phone]       Fax: [phone]   RxID:   [id number]

## 2010-04-19 NOTE — Medication Information (Signed)
Summary: rov/tm  Anticoagulant Therapy  Managed by: Weston Brass, PharmD PCP: Lanier Ensign Supervising MD: Gala Romney MD, Reuel Boom Indication 1: Atrial Fibrillation Lab Used: [facility] [place] Site: [address] INR POC 1.6 INR RANGE 2 - 3  Dietary changes: no    Health status changes: no    Bleeding/hemorrhagic complications: no    Recent/future hospitalizations: yes       Details: Heart cath 03/26/10, resumed Coumadin 03/27/10  Any changes in medication regimen? no    Recent/future dental: no  Any missed doses?: no       Is patient compliant with meds? yes       Allergies: 1)  ! * Boniva  Anticoagulation Management History:      The patient is taking warfarin and comes in today for a routine follow up visit.  Positive risk factors for bleeding include an age of 32 years or older.  The bleeding index is 'intermediate risk'.  Positive CHADS2 values include History of HTN.  Negative CHADS2 values include Age > 51 years old.  Her last INR was 2.30.  Anticoagulation responsible provider: Johnatha Zeidman MD, Reuel Boom.  INR POC: 1.6.  Cuvette Lot#: [id number].  Exp: 04/2011.    Anticoagulation Management Assessment/Plan:      The patient's current anticoagulation dose is Warfarin sodium 5 mg tabs: Use as directed by Anticoagulation Clinic.  The target INR is 2.0-3.0.  The next INR is due 04/18/2010.  Anticoagulation instructions were given to patient.  Results were reviewed/authorized by Weston Brass, PharmD.         Prior Anticoagulation Instructions: INR 2.6 Continue 1 pill everyday except 1.5 pills on Sundays and Thursdays. Recheck in 4 weeks.   Current Anticoagulation Instructions: INR 1.6  Coumadin 5 mg tablets - Take 1.5 tablets today, then resume 1 tablet every day except 1.5 tablets on Sundays and Thursdays

## 2010-04-19 NOTE — Medication Information (Signed)
Summary: rov/nb  Anticoagulant Therapy  Managed by: Weston Brass, PharmD PCP: Lanier Ensign Supervising MD: Gala Romney MD, Reuel Boom Indication 1: Atrial Fibrillation Lab Used: [facility] [place] Site: [address] INR RANGE 2 - 3  Dietary changes: no    Health status changes: no    Bleeding/hemorrhagic complications: no    Recent/future hospitalizations: no    Any changes in medication regimen? no    Recent/future dental: no  Any missed doses?: no       Is patient compliant with meds? yes       Allergies: 1)  ! Asa 2)  ! * Boniva  Anticoagulation Management History:      Positive risk factors for bleeding include an age of 16 years or older.  The bleeding index is 'intermediate risk'.  Positive CHADS2 values include History of HTN.  Negative CHADS2 values include Age > 60 years old.  Anticoagulation responsible Jennifer House: Bensimhon MD, Reuel Boom.  Cuvette Lot#: [id number].  Exp: 03/2011.    Anticoagulation Management Assessment/Plan:      The patient's current anticoagulation dose is Warfarin sodium 5 mg tabs: Use as directed by Anticoagulation Clinic.  The target INR is 2.0-3.0.  The next INR is due 03/16/2010.  Anticoagulation instructions were given to patient.  Results were reviewed/authorized by Weston Brass, PharmD.         Prior Anticoagulation Instructions: INR 2.1 Continue previous dose 1 tablet everyday except 1.5 tablets on Sunday and Thursday Recheck INR in 4 weeks  Current Anticoagulation Instructions: INR 3.2 The patient is to hold one dose of coumadin today then continue with the same dose of coumadin.  1.5 tablet (7.5mg) on Sun, Thu 1 tablet (5mg) on the rest of the days Recheck INR in on Dec. 30th  Prescriptions: WARFARIN SODIUM 5 MG TABS (WARFARIN SODIUM) Use as directed by Anticoagulation Clinic  #35 x 2   Entered by:   Sally Putt PharmD   Authorized by:   James Allred, MD   Signed by:   Sally Putt PharmD on 02/27/2010   Method used:    [facility] to        [facility]  [address] [id number]       [address]       [place], [place]  [address]       Ph: [phone] or [phone]       Fax: [phone]   RxID:   [id number]

## 2010-04-19 NOTE — Medication Information (Signed)
Summary: rov/mwb  Anticoagulant Therapy  Managed by: Bethena Midget, RN, BSN PCP: Lanier Ensign Supervising MD: Clifton James MD, Cristal Deer Indication 1: Atrial Fibrillation Lab Used: [facility] [place] Site: [address] INR POC 2.6 INR RANGE 2 - 3  Dietary changes: no    Health status changes: no    Bleeding/hemorrhagic complications: no    Recent/future hospitalizations: no    Any changes in medication regimen? no    Recent/future dental: no  Any missed doses?: no       Is patient compliant with meds? yes       Allergies: 1)  ! Asa 2)  ! * Boniva  Anticoagulation Management History:      The patient is taking warfarin and comes in today for a routine follow up visit.  Positive risk factors for bleeding include an age of 50 years or older.  The bleeding index is 'intermediate risk'.  Positive CHADS2 values include History of HTN.  Negative CHADS2 values include Age > 45 years old.  Anticoagulation responsible provider: Clifton James MD, Cristal Deer.  INR POC: 2.6.  Cuvette Lot#: [id number].  Exp: 04/2011.    Anticoagulation Management Assessment/Plan:      The patient's current anticoagulation dose is Warfarin sodium 5 mg tabs: Use as directed by Anticoagulation Clinic.  The target INR is 2.0-3.0.  The next INR is due 04/13/2010.  Anticoagulation instructions were given to patient.  Results were reviewed/authorized by Bethena Midget, RN, BSN.  She was notified by Bethena Midget, RN, BSN.         Prior Anticoagulation Instructions: INR 3.2 The patient is to hold one dose of coumadin today then continue with the same dose of coumadin.  1.5 tablet (7.5mg ) on Sun, Thu 1 tablet (5mg ) on the rest of the days Recheck INR in on Dec. 30th   Current Anticoagulation Instructions: INR 2.6 Continue 1 pill everyday except 1.5 pills on Sundays and Thursdays. Recheck in 4 weeks.

## 2010-04-25 NOTE — Medication Information (Signed)
Summary: rov/ewj  Anticoagulant Therapy  Managed by: Bethena Midget, RN, BSN Referring MD: Johney Frame MD, Fayrene Fearing PCP: Lanier Ensign Supervising MD: Ladona Ridgel MD, Sharlot Gowda Indication 1: Atrial Fibrillation Lab Used: [facility]  Site: [address] INR POC 1.9 INR RANGE 2 - 3  Dietary changes: no    Health status changes: no    Bleeding/hemorrhagic complications: no    Recent/future hospitalizations: no    Any changes in medication regimen? no    Recent/future dental: no  Any missed doses?: no       Is patient compliant with meds? yes      Comments: Pt has been eating more salads and would like to continue and maybe increase the amt, dose adjusted and encouraged more salads.   Allergies: 1)  ! * Boniva  Anticoagulation Management History:      The patient is taking warfarin and comes in today for a routine follow up visit.  Positive risk factors for bleeding include an age of 5 years or older.  The bleeding index is 'intermediate risk'.  Positive CHADS2 values include History of HTN.  Negative CHADS2 values include Age > 59 years old.  Her last INR was 2.30.  Anticoagulation responsible provider: Ladona Ridgel MD, Sharlot Gowda.  INR POC: 1.9.  Cuvette Lot#: [id number].  Exp: 03/2011.    Anticoagulation Management Assessment/Plan:      The patient's current anticoagulation dose is Warfarin sodium 5 mg tabs: Use as directed by Anticoagulation Clinic.  The target INR is 2.0-3.0.  The next INR is due 05/09/2010.  Anticoagulation instructions were given to patient.  Results were reviewed/authorized by Bethena Midget, RN, BSN.  She was notified by Bethena Midget, RN, BSN.         Prior Anticoagulation Instructions: INR 1.6  Coumadin 5 mg tablets - Take 1.5 tablets today, then resume 1 tablet every day except 1.5 tablets on Sundays and Thursdays   Current Anticoagulation Instructions: INR 1.9 Today take 1.5 pills then change dose to 1 pill everyday except 1.5 pills on Tuesdays, Thursdays  and Sundays. Recheck in 3 weeks.

## 2010-04-25 NOTE — Assessment & Plan Note (Signed)
Summary: F1M/PER CHECK OUT/JML   Vital Signs:  Patient profile:   65 year old female Height:      64 inches Weight:      196 pounds BMI:     33.76 Pulse rate:   51 / minute Resp:     16 per minute BP sitting:   135 / 93  (right arm)  Vitals Entered By: Marrion Coy, CNA (April 18, 2010 11:23 AM)  Referring Provider:  Armanda Magic, MD Primary Provider:  Lanier Ensign   History of Present Illness: The patient presents today for cardiology followup.  Her chest pain and SOB have resolved.  She had a cath by Dr Eden Emms which revealed normal cors and LV function.  She has longstanding atrial fibrillation which she tolerates quite well.  She reports occasional palpitations but feels that these are much improved with recently added metoprolol.  She denies symptoms of orthopnea, PND, lower extremity edema, dizziness, presyncope, syncope, or neurologic sequela. The patient is tolerating medications without difficulties and is otherwise without complaint today.   Current Medications (verified): 1)  Caltrate 600 1500 Mg Tabs (Calcium Carbonate) .... 3 Tablets Daily 2)  Centrum Silver  Tabs (Multiple Vitamins-Minerals) .... Daily 3)  Amlodipine Besy-Benazepril Hcl 10-20 Mg Caps (Amlodipine Besy-Benazepril Hcl) .... One Capsule Once Daily 4)  Pravachol 40 Mg Tabs (Pravastatin Sodium) .... One Tablet At Bedtime 5)  Fish Oil   Oil (Fish Oil) .... 1200mg  2 Tabs Two Times A Day 6)  Synthroid 75 Mcg Tabs (Levothyroxine Sodium) .... Take One Tablet By Mouth Once Daily. 7)  Ambien Cr 12.5 Mg Cr-Tabs (Zolpidem Tartrate) .... One Tablet By Mouth At Bedtime As Needed 8)  Vitamin D 1000 Unit Tabs (Cholecalciferol) .... 2 Tablets Daily 9)  Hydrocodone-Acetaminophen 5-500 Mg Tabs (Hydrocodone-Acetaminophen) .... As Needed 10)  Warfarin Sodium 5 Mg Tabs (Warfarin Sodium) .... Use As Directed By Anticoagulation Clinic 11)  Metoprolol Tartrate 50 Mg Tabs (Metoprolol Tartrate) .... 1/2 Tablet Two Times A  Day 12)  Nitrostat 0.4 Mg Subl (Nitroglycerin) .... As Directed  Allergies (verified): 1)  ! 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 TAH 1985 Foot surgery cath 1/12 revealed normal cors and LV function  Social History: Reviewed history from 02/01/2009 and no changes required. Lives in [place] with husband.    She is a housewife.   She denies tobacco, ETOH, drugs  Review of Systems       All systems are reviewed and negative except as listed in the HPI.   Physical Exam  General:  Well developed, well nourished, in no acute distress. Head:  normocephalic and atraumatic Eyes:  PERRLA/EOM intact; conjunctiva and lids normal. Mouth:  Teeth, gums and palate normal. Oral mucosa normal. Neck:  Neck supple, no JVD. No masses, thyromegaly or abnormal cervical nodes. Lungs:  Clear bilaterally to auscultation and percussion. Heart:  iRRR, no m/r/g Abdomen:  Bowel sounds positive; abdomen soft and non-tender without masses, organomegaly, or hernias noted. No hepatosplenomegaly. Msk:  Back normal, normal gait. Muscle strength and tone normal. Extremities:  No clubbing or cyanosis. Neurologic:  Alert and oriented x 3.   Impression & Recommendations:  Problem # 1:  CHEST PAIN (ICD-786.50) atypical and resolved normal cath 1/12 no further workup  planned  Problem # 2:  ATRIAL FIBRILLATION (ICD-427.31) improved continue coumadin  Problem # 3:  HYPERTENSION (ICD-401.9) above goal salt restriction  Problem # 4:  HYPERLIPIDEMIA, MIXED (ICD-272.2) stable no changes Her updated medication list for this problem includes:    Pravachol 40 Mg Tabs (Pravastatin sodium) ..... One tablet at bedtime  Patient Instructions: 1)  Your physician recommends that you schedule a follow-up appointment in: 6 months 2)  Your physician recommends that you continue on your current medications as directed. Please refer to the Current Medication list given to you today. 3)  Your physician has requested that you limit the intake of sodium (salt) in your diet to four grams daily. Please see [facility] handout.

## 2010-05-04 ENCOUNTER — Encounter (INDEPENDENT_AMBULATORY_CARE_PROVIDER_SITE_OTHER): Payer: MEDICARE

## 2010-05-04 ENCOUNTER — Encounter: Payer: Self-pay | Admitting: Cardiology

## 2010-05-04 DIAGNOSIS — I4891 Unspecified atrial fibrillation: Secondary | ICD-10-CM

## 2010-05-04 DIAGNOSIS — Z7901 Long term (current) use of anticoagulants: Secondary | ICD-10-CM

## 2010-05-04 LAB — CONVERTED CEMR LAB: POC INR: 2.5

## 2010-05-09 NOTE — Medication Information (Signed)
Summary: Jennifer pt rs appt/mt  Anticoagulant Therapy  Managed by: Bethena Midget, RN, BSN Referring MD: Johney Frame MD, Fayrene Fearing PCP: Lanier Ensign Supervising MD: Daleen Squibb MD, Maisie Fus Indication 1: Atrial Fibrillation Lab Used: [facility] [place] Site: [address] INR POC 2.5 INR RANGE 2 - 3  Dietary changes: no    Health status changes: no    Bleeding/hemorrhagic complications: no    Recent/future hospitalizations: no    Any changes in medication regimen? no    Recent/future dental: no  Any missed doses?: no       Is patient compliant with meds? yes       Allergies: 1)  ! * Boniva  Anticoagulation Management History:      The patient is taking warfarin and comes in today for a routine follow up visit.  Positive risk factors for bleeding include an age of 63 years or older.  The bleeding index is 'intermediate risk'.  Positive CHADS2 values include History of HTN.  Negative CHADS2 values include Age > 59 years old.  Her last INR was 2.30.  Anticoagulation responsible provider: Daleen Squibb MD, Maisie Fus.  INR POC: 2.5.  Cuvette Lot#: [id number].  Exp: 03/2011.    Anticoagulation Management Assessment/Plan:      The patient's current anticoagulation dose is Warfarin sodium 5 mg tabs: Use as directed by Anticoagulation Clinic.  The target INR is 2.0-3.0.  The next INR is due 05/25/2010.  Anticoagulation instructions were given to patient.  Results were reviewed/authorized by Bethena Midget, RN, BSN.  She was notified by Bethena Midget, RN, BSN.         Prior Anticoagulation Instructions: INR 1.9 Today take 1.5 pills then change dose to 1 pill everyday except 1.5 pills on Tuesdays, Thursdays and Sundays. Recheck in 3 weeks.   Current Anticoagulation Instructions: INR 2.5 Continue 1 pill everyday except 1.5 pills on Sundays, Tuesdays and Thursdays. Recheck in 3 weeks.

## 2010-05-24 ENCOUNTER — Encounter: Payer: Self-pay | Admitting: Internal Medicine

## 2010-05-24 DIAGNOSIS — I4891 Unspecified atrial fibrillation: Secondary | ICD-10-CM

## 2010-05-25 ENCOUNTER — Encounter: Payer: Self-pay | Admitting: Cardiology

## 2010-05-25 ENCOUNTER — Encounter (INDEPENDENT_AMBULATORY_CARE_PROVIDER_SITE_OTHER): Payer: Medicare Other

## 2010-05-25 DIAGNOSIS — Z7901 Long term (current) use of anticoagulants: Secondary | ICD-10-CM

## 2010-05-25 DIAGNOSIS — I4891 Unspecified atrial fibrillation: Secondary | ICD-10-CM

## 2010-05-25 LAB — CONVERTED CEMR LAB: POC INR: 2.4

## 2010-05-29 NOTE — Medication Information (Signed)
Summary: rov/tm  Anticoagulant Therapy  Managed by: Jennifer Reams, Jennifer House, Jennifer House Referring MD: Johney Frame MD, Fayrene Fearing PCP: Lanier Ensign Supervising MD: Riley Kill MD, Maisie Fus Indication 1: Atrial Fibrillation Lab Used: [facility] [place] Site: [address] INR POC 2.4 INR RANGE 2 - 3  Dietary changes: no    Health status changes: no    Bleeding/hemorrhagic complications: no    Recent/future hospitalizations: no    Any changes in medication regimen? no    Recent/future dental: no  Any missed doses?: no       Is patient compliant with meds? yes       Allergies: 1)  ! * Boniva  Anticoagulation Management History:      The patient is taking warfarin and comes in today for a routine follow up visit.  Positive risk factors for bleeding include an age of 67 years or older.  The bleeding index is 'intermediate risk'.  Positive CHADS2 values include History of HTN.  Negative CHADS2 values include Age > 81 years old.  Her last INR was 2.30.  Anticoagulation responsible provider: Riley Kill MD, Maisie Fus.  INR POC: 2.4.  Cuvette Lot#: [id number].  Exp: 05/2011.    Anticoagulation Management Assessment/Plan:      The patient's current anticoagulation dose is Warfarin sodium 5 mg tabs: Use as directed by Anticoagulation Clinic.  The target INR is 2.0-3.0.  The next INR is due 06/25/2010.  Anticoagulation instructions were given to patient.  Results were reviewed/authorized by Jennifer Reams, Jennifer House, Jennifer House.  She was notified by Jennifer Reams Jennifer House.         Prior Anticoagulation Instructions: INR 2.5 Continue 1 pill everyday except 1.5 pills on Sundays, Tuesdays and Thursdays. Recheck in 3 weeks.   Current Anticoagulation Instructions: INR 2.4  Continue on same dosage 1 tablet daily except 1.5 tablets on Sundays, Tuesdays and Thursdays.  Recheck in 4 weeks.

## 2010-06-11 ENCOUNTER — Encounter: Payer: Self-pay | Admitting: Internal Medicine

## 2010-06-25 ENCOUNTER — Ambulatory Visit (INDEPENDENT_AMBULATORY_CARE_PROVIDER_SITE_OTHER): Payer: Medicare Other | Admitting: *Deleted

## 2010-06-25 DIAGNOSIS — I4891 Unspecified atrial fibrillation: Secondary | ICD-10-CM

## 2010-06-25 DIAGNOSIS — Z7901 Long term (current) use of anticoagulants: Secondary | ICD-10-CM | POA: Insufficient documentation

## 2010-06-25 LAB — POCT INR: INR: 3

## 2010-07-10 ENCOUNTER — Other Ambulatory Visit: Payer: Self-pay | Admitting: Internal Medicine

## 2010-07-23 ENCOUNTER — Ambulatory Visit (INDEPENDENT_AMBULATORY_CARE_PROVIDER_SITE_OTHER): Payer: Medicare Other | Admitting: *Deleted

## 2010-07-23 DIAGNOSIS — I4891 Unspecified atrial fibrillation: Secondary | ICD-10-CM

## 2010-07-23 LAB — POCT INR: INR: 3.1

## 2010-08-03 NOTE — Procedures (Signed)
[place]  Patient:    Jennifer House, Jennifer House                      MRN: [id number] Proc. Date: 09/01/00 Adm. Date:  [id number] Attending:  Rich Brave CC:         Delorse Lek, M.D.  Juluis Mire, M.D.   Procedure Report  PROCEDURE:  Colonoscopy.  ENDOSCOPIST:  Florencia Reasons, M.D.  INDICATIONS:  Intermittent rectal bleeding and constipation in a 65 year old female.  FINDINGS:  Normal exam to the cecum.  DESCRIPTION OF PROCEDURE:  The nature, purpose, and risks of the procedure had been discussed with the patient who provided written consent.  Sedation was fentanyl 100 mcg and Versed 10 mg IV without arrhythmias or desaturation. The Olympus adjustable tension video colonoscope was advanced without significant difficulty the cecum, and pullback was then performed.  The quality of the prep was excellent, and it is felt that all areas were well seen.  This was a normal examination.  No polyps, cancer, colitis, vascular malformations, or diverticulosis were appreciated.  Retroflexion in the rectum by my recollection was unremarkable, and pullout through the anal canal did not disclose any overt problems.  The patient probably did have some hemorrhoidal skin tags, but no dramatic hemorrhoids were visualized.  Note that she has not had any rectal bleeding since going on the MiraLax at the time of her office visit with me about six weeks ago.  The MiraLax has worked well to maintained identification.  IMPRESSION:  Normal colonoscopy.  The patients previous rectal bleeding is presumed to have been of hemorrhoidal origin even though dramatic hemorrhoids were not observed on todays exam.  PLAN:  Continue MiraLax on a p.r.n. basis.  I would favor a repeat colon screening exam, either a colonoscopy or a flexible sigmoidoscopy, about five years from now. DD:  09/01/00 TD:  09/01/00 Job: [id number]

## 2010-08-03 NOTE — Op Note (Signed)
NAME:  Jennifer House, Jennifer House                         ACCOUNT NO.:  [id number]   MEDICAL RECORD NO.:  [id number]                   PATIENT TYPE:  AMB   LOCATION:  [facility]                                 FACILITY:  [facility]   PHYSICIAN:  Valetta Fuller, M.D.               DATE OF BIRTH:  1945/08/09   DATE OF PROCEDURE:  10/17/2003  DATE OF DISCHARGE:                                 OPERATIVE REPORT   PREOPERATIVE DIAGNOSES:  1. Gross hematuria.  2. Transitional cell carcinoma of the bladder.   POSTOPERATIVE DIAGNOSES:  1. Gross hematuria.  2. Transitional cell carcinoma of the bladder.   PROCEDURE:  Cystoscopy, left stent placement, TURBT.   SURGEON:  Valetta Fuller, M.D.   ANESTHESIA:  General.   INDICATIONS FOR PROCEDURE:  Ms. Ost is a 65 year old female.  She  presented recently for evaluation of some gross hematuria.  The patient had  an evaluation through her primary care Miaisabella Bacorn. When she came to our  office, her anticoagulation that she had been on was on hold and her  hematuria had resolved.  We performed flexible cystoscopy in the office and  noted a 2 to 2 1/2 cm transitional cell carcinoma involving her left  hemitrigone. This appeared to be a well differentiated and noninvasive  tumor. It appeared to be surrounding her left ureteral orifice.  The patient  did have a previous tobacco use history.  The patient had had a urine  culture which was negative and a CT of the abdomen and pelvis which failed  to reveal anything significant other than a very small 1 cm lesion in the  mid pole of her right kidney which was too difficult and small to  characterize.  The patient now presents for definitive management of her  recently diagnosed bladder tumor.   TECHNIQUE:  The patient was brought to the operating room where she had  successful induction of general anesthetic. She was placed in lithotomy  position and prepped and draped in the usual manner. Cystoscopy again  revealed a  2.5 cm tumor that appeared papillary and well differentiated. It  was surrounding the left ureteral orifice.  For that reason, a guidewire was  placed up the left kidney and an open end stent was placed.  A continuous  flow resectoscope was then utilized. We completely resected the tumor around  the orifice and fulgurated the ends.  We felt the patient was at very high  risk for developing some distal ureteral obstruction from scarring if a  double J stent was not placed. Through the open ended catheter, a guidewire  was replaced and the cystoscope was back loaded  over the wire.  A 7 French 24 cm double J stent was then placed and  confirmed to be in good position with fluoroscopy as well as direct vision.  At the end of the procedure, a 20 French Foley catheter was placed.  Hemostasis was excellent. She was brought to the recovery room in stable  condition.                                               Valetta Fuller, M.D.    DSG/MEDQ  D:  10/18/2003  T:  10/18/2003  Job:  [id number]   cc:   Joycelyn Rua, M.D.  [address] [place]  [place] [address]  Fax: [phone]

## 2010-08-20 ENCOUNTER — Ambulatory Visit (INDEPENDENT_AMBULATORY_CARE_PROVIDER_SITE_OTHER): Payer: Medicare Other | Admitting: *Deleted

## 2010-08-20 DIAGNOSIS — I4891 Unspecified atrial fibrillation: Secondary | ICD-10-CM

## 2010-08-20 LAB — POCT INR: INR: 3.1

## 2010-09-10 ENCOUNTER — Ambulatory Visit (INDEPENDENT_AMBULATORY_CARE_PROVIDER_SITE_OTHER): Payer: Medicare Other | Admitting: *Deleted

## 2010-09-10 DIAGNOSIS — I4891 Unspecified atrial fibrillation: Secondary | ICD-10-CM

## 2010-09-10 LAB — POCT INR: INR: 3.2

## 2010-10-01 ENCOUNTER — Ambulatory Visit (INDEPENDENT_AMBULATORY_CARE_PROVIDER_SITE_OTHER): Payer: Medicare Other | Admitting: *Deleted

## 2010-10-01 DIAGNOSIS — I4891 Unspecified atrial fibrillation: Secondary | ICD-10-CM

## 2010-10-01 LAB — POCT INR: INR: 2.2

## 2010-10-12 ENCOUNTER — Other Ambulatory Visit: Payer: Self-pay | Admitting: Internal Medicine

## 2010-10-12 ENCOUNTER — Encounter: Payer: Self-pay | Admitting: Internal Medicine

## 2010-10-17 ENCOUNTER — Encounter: Payer: Self-pay | Admitting: Internal Medicine

## 2010-10-17 ENCOUNTER — Ambulatory Visit (INDEPENDENT_AMBULATORY_CARE_PROVIDER_SITE_OTHER): Payer: Medicare Other | Admitting: *Deleted

## 2010-10-17 ENCOUNTER — Ambulatory Visit (INDEPENDENT_AMBULATORY_CARE_PROVIDER_SITE_OTHER): Payer: Medicare Other | Admitting: Internal Medicine

## 2010-10-17 DIAGNOSIS — I4891 Unspecified atrial fibrillation: Secondary | ICD-10-CM

## 2010-10-17 DIAGNOSIS — I10 Essential (primary) hypertension: Secondary | ICD-10-CM

## 2010-10-17 DIAGNOSIS — R079 Chest pain, unspecified: Secondary | ICD-10-CM

## 2010-10-17 LAB — POCT INR: INR: 2.3

## 2010-10-17 NOTE — Patient Instructions (Signed)
Your physician wants you to follow-up in: 6 months with Dr Allred You will receive a reminder letter in the mail two months in advance. If you don't receive a letter, please call our office to schedule the follow-up appointment.   Your physician has requested that you have an echocardiogram. Echocardiography is a painless test that uses sound waves to create images of your heart. It provides your doctor with information about the size and shape of your heart and how well your heart's chambers and valves are working. This procedure takes approximately one hour. There are no restrictions for this procedure.    

## 2010-10-17 NOTE — Assessment & Plan Note (Signed)
Stable No change required today  

## 2010-10-17 NOTE — Assessment & Plan Note (Signed)
Atypical Recent cath was normal She will follow up with PCP for consideration of GI causes of discomfort if it returns

## 2010-10-17 NOTE — Progress Notes (Signed)
The patient presents today for routine electrophysiology followup.  Since last being seen in our clinic, the patient reports doing reasonably well.  She has only rare palpitations with her afib.  She denies any further chest pain.  She did have epigastric pain which radiated under her left breast several weeks ago which has resolved.  Today, she denies symptoms of shortness of breath, orthopnea, PND, lower extremity edema, dizziness, presyncope, syncope, or neurologic sequela.  The patient feels that she is tolerating medications without difficulties and is otherwise without complaint today.   Past Medical History  Diagnosis Date  . Atrial fibrillation     permanent  . HTN (hypertension)   . HLD (hyperlipidemia)     mixed  . Hematuria     hx  . Renal calculus     hx  . Restless leg syndrome   . Osteoarthritis   . GERD (gastroesophageal reflux disease)   . Insomnia   . Anxiety   . Liver function test abnormality     hx  . Depression   . Obesity   . Hypothyroidism   . Carcinoma of bladder     transitional cell hx  . Osteopenia    Past Surgical History  Procedure Date  . Cystocopy 10/17/03    left stent placement, TURBT  . Total abdominal hysterectomy 1985  . Foot surgery   . Cardiac catheterization 1/12    revealed normal cors and LV function    Current Outpatient Prescriptions  Medication Sig Dispense Refill  . amLODipine-benazepril (LOTREL) 10-20 MG per capsule Take 1 capsule by mouth daily.        . Calcium Carbonate (CALTRATE 600) 1500 MG TABS Take 3 tablets by mouth daily.        . cholecalciferol (VITAMIN D) 1000 UNITS tablet Take 1,000 Units by mouth daily.        Marland Kitchen HYDROcodone-acetaminophen (VICODIN) 5-500 MG per tablet Take 1 tablet by mouth as needed.        Marland Kitchen levothyroxine (SYNTHROID) 75 MCG tablet Take 75 mcg by mouth daily.        . Multiple Vitamins-Minerals (CENTRUM SILVER) tablet Take 1 tablet by mouth daily.        . nitroGLYCERIN (NITROSTAT) 0.4 MG SL tablet  Place 0.4 mg under the tongue every 5 (five) minutes as needed.        . Omega-3 Fatty Acids (FISH OIL PO) Take 2 tablets by mouth 2 (two) times daily.        . pravastatin (PRAVACHOL) 40 MG tablet Take 40 mg by mouth daily.        Marland Kitchen warfarin (COUMADIN) 5 MG tablet USE AS DIRECTED BY ANTICOAGULATION CLINIC  45 tablet  2  . zolpidem (AMBIEN CR) 12.5 MG CR tablet Take 12.5 mg by mouth at bedtime as needed.          Allergies  Allergen Reactions  . Aspirin   . Ibandronate Sodium     History   Social History  . Marital Status: Married    Spouse Name: N/A    Number of Children: N/A  . Years of Education: N/A   Occupational History  . Not on file.   Social History Main Topics  . Smoking status: Former Smoker    Quit date: 03/18/1985  . Smokeless tobacco: Not on file   Comment: denies   . Alcohol Use: No  . Drug Use: No  . Sexually Active: Not on file   Other Topics Concern  .  Not on file   Social History Narrative   Lives in [place], with husband.     Family History  Problem Relation Age of Onset  . Heart failure Mother   . Stroke Father   . Stroke Brother    Physical Exam: Filed Vitals:   10/17/10 1011  BP: 128/81  Pulse: 70  Height: 5\' 4"  (1.626 m)  Weight: 199 lb (90.266 kg)    GEN- The patient is well appearing, alert and oriented x 3 today.   Head- normocephalic, atraumatic Eyes-  Sclera clear, conjunctiva pink Ears- hearing intact Oropharynx- clear Neck- supple, no JVP Lymph- no cervical lymphadenopathy Lungs- Clear to ausculation bilaterally, normal work of breathing Heart- irregular rate and rhythm, no murmurs, rubs or gallops, PMI not laterally displaced GI- soft, NT, ND, + BS Extremities- no clubbing, cyanosis, or edema MS- no significant deformity or atrophy  ekg today reveals afib, V rate 65 bpm,   Assessment and Plan:

## 2010-10-17 NOTE — Assessment & Plan Note (Signed)
Rate controlled permanent afib Continue coumadin longterm We will repeat an echo to evaluate for any structural changes

## 2010-11-01 ENCOUNTER — Ambulatory Visit (HOSPITAL_COMMUNITY): Payer: Medicare Other | Attending: Internal Medicine

## 2010-11-01 DIAGNOSIS — I079 Rheumatic tricuspid valve disease, unspecified: Secondary | ICD-10-CM | POA: Insufficient documentation

## 2010-11-01 DIAGNOSIS — R079 Chest pain, unspecified: Secondary | ICD-10-CM | POA: Insufficient documentation

## 2010-11-01 DIAGNOSIS — I080 Rheumatic disorders of both mitral and aortic valves: Secondary | ICD-10-CM | POA: Insufficient documentation

## 2010-11-01 DIAGNOSIS — I10 Essential (primary) hypertension: Secondary | ICD-10-CM

## 2010-11-01 DIAGNOSIS — I4891 Unspecified atrial fibrillation: Secondary | ICD-10-CM | POA: Insufficient documentation

## 2010-11-01 DIAGNOSIS — E669 Obesity, unspecified: Secondary | ICD-10-CM | POA: Insufficient documentation

## 2010-11-01 DIAGNOSIS — I379 Nonrheumatic pulmonary valve disorder, unspecified: Secondary | ICD-10-CM | POA: Insufficient documentation

## 2010-11-06 ENCOUNTER — Encounter: Payer: Self-pay | Admitting: *Deleted

## 2010-11-14 ENCOUNTER — Ambulatory Visit (INDEPENDENT_AMBULATORY_CARE_PROVIDER_SITE_OTHER): Payer: Medicare Other | Admitting: *Deleted

## 2010-11-14 DIAGNOSIS — I4891 Unspecified atrial fibrillation: Secondary | ICD-10-CM

## 2010-11-14 LAB — POCT INR: INR: 3

## 2010-12-12 ENCOUNTER — Ambulatory Visit (INDEPENDENT_AMBULATORY_CARE_PROVIDER_SITE_OTHER): Payer: Medicare Other | Admitting: *Deleted

## 2010-12-12 DIAGNOSIS — I4891 Unspecified atrial fibrillation: Secondary | ICD-10-CM

## 2010-12-12 LAB — POCT INR: INR: 2.6

## 2011-01-09 ENCOUNTER — Ambulatory Visit (INDEPENDENT_AMBULATORY_CARE_PROVIDER_SITE_OTHER): Payer: Medicare Other | Admitting: *Deleted

## 2011-01-09 DIAGNOSIS — Z7901 Long term (current) use of anticoagulants: Secondary | ICD-10-CM

## 2011-01-09 DIAGNOSIS — I4891 Unspecified atrial fibrillation: Secondary | ICD-10-CM

## 2011-01-09 LAB — POCT INR: INR: 2.2

## 2011-02-04 ENCOUNTER — Other Ambulatory Visit: Payer: Self-pay | Admitting: Internal Medicine

## 2011-02-20 ENCOUNTER — Ambulatory Visit (INDEPENDENT_AMBULATORY_CARE_PROVIDER_SITE_OTHER): Payer: Medicare Other | Admitting: *Deleted

## 2011-02-20 DIAGNOSIS — Z7901 Long term (current) use of anticoagulants: Secondary | ICD-10-CM

## 2011-02-20 DIAGNOSIS — I4891 Unspecified atrial fibrillation: Secondary | ICD-10-CM

## 2011-02-20 LAB — POCT INR: INR: 2.3

## 2011-04-03 ENCOUNTER — Ambulatory Visit (INDEPENDENT_AMBULATORY_CARE_PROVIDER_SITE_OTHER): Payer: Medicare Other | Admitting: *Deleted

## 2011-04-03 DIAGNOSIS — I4891 Unspecified atrial fibrillation: Secondary | ICD-10-CM

## 2011-04-03 DIAGNOSIS — Z7901 Long term (current) use of anticoagulants: Secondary | ICD-10-CM

## 2011-05-02 ENCOUNTER — Encounter: Payer: Self-pay | Admitting: Internal Medicine

## 2011-05-02 ENCOUNTER — Ambulatory Visit (INDEPENDENT_AMBULATORY_CARE_PROVIDER_SITE_OTHER): Payer: Medicare Other | Admitting: Internal Medicine

## 2011-05-02 ENCOUNTER — Ambulatory Visit (INDEPENDENT_AMBULATORY_CARE_PROVIDER_SITE_OTHER): Payer: Medicare Other | Admitting: *Deleted

## 2011-05-02 DIAGNOSIS — I10 Essential (primary) hypertension: Secondary | ICD-10-CM

## 2011-05-02 DIAGNOSIS — I4891 Unspecified atrial fibrillation: Secondary | ICD-10-CM

## 2011-05-02 DIAGNOSIS — Z7901 Long term (current) use of anticoagulants: Secondary | ICD-10-CM

## 2011-05-02 NOTE — Assessment & Plan Note (Signed)
Rate controlled permanent afib Continue coumadin longterm  Echo from 8/12 reviewed

## 2011-05-02 NOTE — Assessment & Plan Note (Signed)
Stable No change required today  

## 2011-05-02 NOTE — Patient Instructions (Signed)
Your physician wants you to follow-up in: 12 months with Dr Allred You will receive a reminder letter in the mail two months in advance. If you don't receive a letter, please call our office to schedule the follow-up appointment.  

## 2011-05-02 NOTE — Progress Notes (Signed)
The patient presents today for routine electrophysiology followup.  Since last being seen in our clinic, the patient reports doing reasonably well.  She has only rare palpitations with her afib.  She denies any chest pain.  Today, she denies symptoms of shortness of breath, orthopnea, PND, lower extremity edema, dizziness, presyncope, syncope, or neurologic sequela.  The patient feels that she is tolerating medications without difficulties and is otherwise without complaint today.   Past Medical History  Diagnosis Date  . Atrial fibrillation     permanent  . HTN (hypertension)   . HLD (hyperlipidemia)     mixed  . Hematuria     hx  . Renal calculus     hx  . Restless leg syndrome   . Osteoarthritis   . GERD (gastroesophageal reflux disease)   . Insomnia   . Anxiety   . Liver function test abnormality     hx  . Depression   . Obesity   . Hypothyroidism   . Carcinoma of bladder     transitional cell hx  . Osteopenia    Past Surgical History  Procedure Date  . Cystocopy 10/17/03    left stent placement, TURBT  . Total abdominal hysterectomy 1985  . Foot surgery   . Cardiac catheterization 1/12    revealed normal cors and LV function    Current Outpatient Prescriptions  Medication Sig Dispense Refill  . amLODipine-benazepril (LOTREL) 10-20 MG per capsule Take 1 capsule by mouth daily.        . Calcium Carbonate (CALTRATE 600) 1500 MG TABS Take 3 tablets by mouth daily.        . cholecalciferol (VITAMIN D) 1000 UNITS tablet Take 1,000 Units by mouth daily.        Marland Kitchen FLUoxetine (PROZAC) 20 MG capsule Take 20 mg by mouth daily.        Marland Kitchen HYDROcodone-acetaminophen (VICODIN) 5-500 MG per tablet Take 1 tablet by mouth as needed.        Marland Kitchen levothyroxine (SYNTHROID) 75 MCG tablet Take 75 mcg by mouth daily.        Marland Kitchen LORazepam (ATIVAN) 0.5 MG tablet Take 0.5 mg by mouth daily. Take 1/2 tablet daily       . metoprolol (LOPRESSOR) 50 MG tablet Take 25 mg by mouth 2 (two) times daily.        . Multiple Vitamins-Minerals (CENTRUM SILVER) tablet Take 1 tablet by mouth daily.        . nitroGLYCERIN (NITROSTAT) 0.4 MG SL tablet Place 0.4 mg under the tongue every 5 (five) minutes as needed.        . Omega-3 Fatty Acids (FISH OIL PO) Take 2 tablets by mouth 2 (two) times daily.        . pravastatin (PRAVACHOL) 40 MG tablet Take 40 mg by mouth daily.        Marland Kitchen warfarin (COUMADIN) 5 MG tablet USE AS DIRECTED BY ANTICOAGULATION CLINIC  45 tablet  3  . zolpidem (AMBIEN CR) 12.5 MG CR tablet Take 12.5 mg by mouth at bedtime as needed.          Allergies  Allergen Reactions  . Ibandronate Sodium     History   Social History  . Marital Status: Married    Spouse Name: N/A    Number of Children: N/A  . Years of Education: N/A   Occupational History  . Not on file.   Social History Main Topics  . Smoking status: Former Smoker  Quit date: 03/18/1985  . Smokeless tobacco: Not on file   Comment: denies   . Alcohol Use: No  . Drug Use: No  . Sexually Active: Not on file   Other Topics Concern  . Not on file   Social History Narrative   Lives in [place], with husband.     Family History  Problem Relation Age of Onset  . Heart failure Mother   . Stroke Father   . Stroke Brother    Physical Exam: Filed Vitals:   05/02/11 0908  BP: 116/70  Pulse: 64  Height: 5\' 4"  (1.626 m)  Weight: 196 lb (88.905 kg)    GEN- The patient is well appearing, alert and oriented x 3 today.   Head- normocephalic, atraumatic Eyes-  Sclera clear, conjunctiva pink Ears- hearing intact Oropharynx- clear Neck- supple, no JVP Lymph- no cervical lymphadenopathy Lungs- Clear to ausculation bilaterally, normal work of breathing Heart- irregular rate and rhythm, no murmurs, rubs or gallops, PMI not laterally displaced GI- soft, NT, ND, + BS Extremities- no clubbing, cyanosis, or edema MS- no significant deformity or atrophy  ekg today reveals afib, V rate 64 bpm,   Assessment and  Plan:

## 2011-05-08 ENCOUNTER — Other Ambulatory Visit: Payer: Self-pay | Admitting: Gastroenterology

## 2011-06-13 ENCOUNTER — Ambulatory Visit (INDEPENDENT_AMBULATORY_CARE_PROVIDER_SITE_OTHER): Payer: Medicare Other | Admitting: *Deleted

## 2011-06-13 DIAGNOSIS — Z7901 Long term (current) use of anticoagulants: Secondary | ICD-10-CM

## 2011-06-13 DIAGNOSIS — I4891 Unspecified atrial fibrillation: Secondary | ICD-10-CM

## 2011-06-13 LAB — POCT INR: INR: 2.4

## 2011-07-25 ENCOUNTER — Other Ambulatory Visit: Payer: Self-pay

## 2011-07-25 ENCOUNTER — Ambulatory Visit (INDEPENDENT_AMBULATORY_CARE_PROVIDER_SITE_OTHER): Payer: Medicare Other

## 2011-07-25 DIAGNOSIS — I4891 Unspecified atrial fibrillation: Secondary | ICD-10-CM

## 2011-07-25 DIAGNOSIS — Z7901 Long term (current) use of anticoagulants: Secondary | ICD-10-CM

## 2011-07-25 LAB — POCT INR: INR: 2.1

## 2011-07-25 MED ORDER — AMLODIPINE BESY-BENAZEPRIL HCL 10-20 MG PO CAPS: 1.0000 | ORAL_CAPSULE | Freq: Every day | ORAL | Status: DC

## 2011-07-25 MED ORDER — METOPROLOL TARTRATE 50 MG PO TABS: 25.0000 mg | ORAL_TABLET | Freq: Two times a day (BID) | ORAL | Status: DC

## 2011-07-25 MED ORDER — WARFARIN SODIUM 5 MG PO TABS: ORAL_TABLET | ORAL | Status: DC

## 2011-07-25 NOTE — Telephone Encounter (Signed)
.   Requested Prescriptions   Signed Prescriptions Disp Refills  . metoprolol (LOPRESSOR) 50 MG tablet 30 tablet 6    Sig: Take 0.5 tablets (25 mg total) by mouth 2 (two) times daily.    Authorizing Provider: Hillis Range    Ordering User: Lacie Scotts amLODipine-benazepril (LOTREL) 10-20 MG per capsule 30 capsule 6    Sig: Take 1 capsule by mouth daily.    Authorizing Provider: Hillis Range    Ordering User: Lacie Scotts

## 2011-09-05 ENCOUNTER — Ambulatory Visit (INDEPENDENT_AMBULATORY_CARE_PROVIDER_SITE_OTHER): Payer: Medicare Other

## 2011-09-05 DIAGNOSIS — Z7901 Long term (current) use of anticoagulants: Secondary | ICD-10-CM

## 2011-09-05 DIAGNOSIS — I4891 Unspecified atrial fibrillation: Secondary | ICD-10-CM

## 2011-09-05 LAB — POCT INR: INR: 2.3

## 2011-10-17 ENCOUNTER — Ambulatory Visit (INDEPENDENT_AMBULATORY_CARE_PROVIDER_SITE_OTHER): Payer: Medicare Other | Admitting: *Deleted

## 2011-10-17 DIAGNOSIS — Z7901 Long term (current) use of anticoagulants: Secondary | ICD-10-CM

## 2011-10-17 DIAGNOSIS — I4891 Unspecified atrial fibrillation: Secondary | ICD-10-CM

## 2011-11-28 ENCOUNTER — Ambulatory Visit (INDEPENDENT_AMBULATORY_CARE_PROVIDER_SITE_OTHER): Payer: Medicare Other | Admitting: *Deleted

## 2011-11-28 DIAGNOSIS — I4891 Unspecified atrial fibrillation: Secondary | ICD-10-CM

## 2011-11-28 DIAGNOSIS — Z7901 Long term (current) use of anticoagulants: Secondary | ICD-10-CM

## 2011-12-04 ENCOUNTER — Other Ambulatory Visit: Payer: Self-pay

## 2011-12-09 ENCOUNTER — Other Ambulatory Visit: Payer: Self-pay

## 2011-12-09 MED ORDER — WARFARIN SODIUM 5 MG PO TABS: ORAL_TABLET | ORAL | Status: DC

## 2012-01-09 ENCOUNTER — Ambulatory Visit (INDEPENDENT_AMBULATORY_CARE_PROVIDER_SITE_OTHER): Payer: Medicare Other | Admitting: Pharmacist

## 2012-01-09 DIAGNOSIS — I4891 Unspecified atrial fibrillation: Secondary | ICD-10-CM

## 2012-01-09 DIAGNOSIS — Z7901 Long term (current) use of anticoagulants: Secondary | ICD-10-CM

## 2012-01-22 ENCOUNTER — Other Ambulatory Visit: Payer: Self-pay | Admitting: Family Medicine

## 2012-01-22 DIAGNOSIS — Z1231 Encounter for screening mammogram for malignant neoplasm of breast: Secondary | ICD-10-CM

## 2012-02-20 ENCOUNTER — Ambulatory Visit (INDEPENDENT_AMBULATORY_CARE_PROVIDER_SITE_OTHER): Payer: Medicare Other | Admitting: *Deleted

## 2012-02-20 DIAGNOSIS — Z7901 Long term (current) use of anticoagulants: Secondary | ICD-10-CM

## 2012-02-20 DIAGNOSIS — I4891 Unspecified atrial fibrillation: Secondary | ICD-10-CM

## 2012-02-20 LAB — POCT INR: INR: 2.4

## 2012-02-24 ENCOUNTER — Ambulatory Visit
Admission: RE | Admit: 2012-02-24 | Discharge: 2012-02-24 | Disposition: A | Discharge status: Home / Self Care | Payer: Medicare Other | Source: Ambulatory Visit | Attending: Family Medicine | Admitting: Family Medicine

## 2012-02-24 DIAGNOSIS — Z1231 Encounter for screening mammogram for malignant neoplasm of breast: Secondary | ICD-10-CM

## 2012-04-02 ENCOUNTER — Ambulatory Visit (INDEPENDENT_AMBULATORY_CARE_PROVIDER_SITE_OTHER): Payer: Medicare Other

## 2012-04-02 DIAGNOSIS — I4891 Unspecified atrial fibrillation: Secondary | ICD-10-CM

## 2012-04-02 DIAGNOSIS — Z7901 Long term (current) use of anticoagulants: Secondary | ICD-10-CM

## 2012-04-02 LAB — POCT INR: INR: 2.4

## 2012-04-02 MED ORDER — WARFARIN SODIUM 5 MG PO TABS: ORAL_TABLET | ORAL | Status: DC

## 2012-05-14 ENCOUNTER — Ambulatory Visit (INDEPENDENT_AMBULATORY_CARE_PROVIDER_SITE_OTHER): Payer: Medicare Other

## 2012-05-14 DIAGNOSIS — Z7901 Long term (current) use of anticoagulants: Secondary | ICD-10-CM

## 2012-05-14 DIAGNOSIS — I4891 Unspecified atrial fibrillation: Secondary | ICD-10-CM

## 2012-05-14 LAB — POCT INR: INR: 2.5

## 2012-06-25 ENCOUNTER — Ambulatory Visit (INDEPENDENT_AMBULATORY_CARE_PROVIDER_SITE_OTHER): Payer: Medicare Other | Admitting: *Deleted

## 2012-06-25 DIAGNOSIS — I4891 Unspecified atrial fibrillation: Secondary | ICD-10-CM

## 2012-06-25 DIAGNOSIS — Z7901 Long term (current) use of anticoagulants: Secondary | ICD-10-CM

## 2012-07-27 ENCOUNTER — Encounter: Payer: Self-pay | Admitting: Internal Medicine

## 2012-07-27 ENCOUNTER — Ambulatory Visit (INDEPENDENT_AMBULATORY_CARE_PROVIDER_SITE_OTHER): Payer: Medicare Other | Admitting: Internal Medicine

## 2012-07-27 ENCOUNTER — Ambulatory Visit (INDEPENDENT_AMBULATORY_CARE_PROVIDER_SITE_OTHER): Payer: Medicare Other | Admitting: Pharmacist

## 2012-07-27 VITALS — BP 130/70 | HR 71 | Wt 202.0 lb

## 2012-07-27 DIAGNOSIS — I10 Essential (primary) hypertension: Secondary | ICD-10-CM

## 2012-07-27 DIAGNOSIS — I4891 Unspecified atrial fibrillation: Secondary | ICD-10-CM

## 2012-07-27 DIAGNOSIS — Z7901 Long term (current) use of anticoagulants: Secondary | ICD-10-CM

## 2012-07-27 NOTE — Patient Instructions (Addendum)
Your physician wants you to follow-up in: 18 months with Dr Johney Frame.  You will receive a reminder letter in the mail two months in advance. If you don't receive a letter, please call our office to schedule the follow-up appointment.

## 2012-07-27 NOTE — Progress Notes (Signed)
PCP: Joycelyn Rua, MD  The patient presents today for routine electrophysiology followup.  Since last being seen in our clinic, the patient reports doing reasonably well.  he denies any chest pain or symptoms with her afib.  Today, she denies symptoms of shortness of breath, orthopnea, PND, lower extremity edema, dizziness, presyncope, syncope, or neurologic sequela.  The patient feels that she is tolerating medications without difficulties and is otherwise without complaint today.   Past Medical History  Diagnosis Date  . Atrial fibrillation     permanent  . HTN (hypertension)   . HLD (hyperlipidemia)     mixed  . Hematuria     hx  . Renal calculus     hx  . Restless leg syndrome   . Osteoarthritis   . GERD (gastroesophageal reflux disease)   . Insomnia   . Anxiety   . Liver function test abnormality     hx  . Depression   . Obesity   . Hypothyroidism   . Carcinoma of bladder     transitional cell hx  . Osteopenia    Past Surgical History  Procedure Laterality Date  . Cystocopy  10/17/03    left stent placement, TURBT  . Total abdominal hysterectomy  1985  . Foot surgery    . Cardiac catheterization  1/12    revealed normal cors and LV function    Current Outpatient Prescriptions  Medication Sig Dispense Refill  . amLODipine-benazepril (LOTREL) 10-20 MG per capsule Take 1 capsule by mouth daily.  30 capsule  6  . Calcium Carbonate (CALTRATE 600) 1500 MG TABS Take 3 tablets by mouth daily.        . cholecalciferol (VITAMIN D) 1000 UNITS tablet Take 1,000 Units by mouth daily.        Marland Kitchen FLUoxetine (PROZAC) 20 MG capsule Take 20 mg by mouth daily.        Marland Kitchen HYDROcodone-acetaminophen (VICODIN) 5-500 MG per tablet Take 1 tablet by mouth as needed.        Marland Kitchen levothyroxine (SYNTHROID) 75 MCG tablet Take 75 mcg by mouth daily.        Marland Kitchen LORazepam (ATIVAN) 0.5 MG tablet Take 0.5 mg by mouth daily. Take 1/2 tablet daily       . metoprolol (LOPRESSOR) 50 MG tablet Take 0.5 tablets  (25 mg total) by mouth 2 (two) times daily.  30 tablet  6  . Multiple Vitamins-Minerals (CENTRUM SILVER) tablet Take 1 tablet by mouth daily.        . nitroGLYCERIN (NITROSTAT) 0.4 MG SL tablet Place 0.4 mg under the tongue every 5 (five) minutes as needed.        . Omega-3 Fatty Acids (FISH OIL PO) Take 2 tablets by mouth 2 (two) times daily.        . pravastatin (PRAVACHOL) 40 MG tablet Take 40 mg by mouth daily.        Marland Kitchen warfarin (COUMADIN) 5 MG tablet Take as directed by anticoagulation clinic  45 tablet  3  . zolpidem (AMBIEN CR) 12.5 MG CR tablet Take 12.5 mg by mouth at bedtime as needed.         No current facility-administered medications for this visit.    Allergies  Allergen Reactions  . Ibandronate Sodium     History   Social History  . Marital Status: Married    Spouse Name: N/A    Number of Children: N/A  . Years of Education: N/A   Occupational History  .  Not on file.   Social History Main Topics  . Smoking status: Former Smoker    Quit date: 03/18/1985  . Smokeless tobacco: Not on file     Comment: denies   . Alcohol Use: No  . Drug Use: No  . Sexually Active: Not on file   Other Topics Concern  . Not on file   Social History Narrative   Lives in [place], with husband.     Family History  Problem Relation Age of Onset  . Heart failure Mother   . Stroke Father   . Stroke Brother    Physical Exam: Filed Vitals:   07/27/12 1132  BP: 130/70  Pulse: 71  Weight: 202 lb (91.627 kg)  SpO2: 94%    GEN- The patient is well appearing, alert and oriented x 3 today.   Head- normocephalic, atraumatic Eyes-  Sclera clear, conjunctiva pink Ears- hearing intact Oropharynx- clear Neck- supple, no JVP Lymph- no cervical lymphadenopathy Lungs- Clear to ausculation bilaterally, normal work of breathing Heart- irregular rate and rhythm, no murmurs, rubs or gallops, PMI not laterally displaced GI- soft, NT, ND, + BS Extremities- no clubbing, cyanosis,  or edema MS- no significant deformity or atrophy  ekg today reveals afib, V rate 71 bpm,   Assessment and Plan:  1. Permanent afib Well rate controlled Continue anticoagulation with coumadin long term  2. HTN Stable No change required today  Return in 18 months

## 2012-08-30 ENCOUNTER — Other Ambulatory Visit: Payer: Self-pay | Admitting: Internal Medicine

## 2012-09-10 ENCOUNTER — Ambulatory Visit (INDEPENDENT_AMBULATORY_CARE_PROVIDER_SITE_OTHER): Payer: Medicare Other | Admitting: *Deleted

## 2012-09-10 DIAGNOSIS — Z7901 Long term (current) use of anticoagulants: Secondary | ICD-10-CM

## 2012-09-10 DIAGNOSIS — I4891 Unspecified atrial fibrillation: Secondary | ICD-10-CM

## 2012-09-10 LAB — POCT INR: INR: 2.8

## 2012-10-22 ENCOUNTER — Ambulatory Visit (INDEPENDENT_AMBULATORY_CARE_PROVIDER_SITE_OTHER): Payer: Medicare Other | Admitting: *Deleted

## 2012-10-22 DIAGNOSIS — Z7901 Long term (current) use of anticoagulants: Secondary | ICD-10-CM

## 2012-10-22 DIAGNOSIS — I4891 Unspecified atrial fibrillation: Secondary | ICD-10-CM

## 2012-11-09 ENCOUNTER — Other Ambulatory Visit: Payer: Self-pay | Admitting: Internal Medicine

## 2012-12-03 ENCOUNTER — Ambulatory Visit (INDEPENDENT_AMBULATORY_CARE_PROVIDER_SITE_OTHER): Payer: Medicare Other | Admitting: *Deleted

## 2012-12-03 DIAGNOSIS — I4891 Unspecified atrial fibrillation: Secondary | ICD-10-CM

## 2012-12-03 DIAGNOSIS — Z7901 Long term (current) use of anticoagulants: Secondary | ICD-10-CM

## 2012-12-04 ENCOUNTER — Encounter: Payer: Self-pay | Admitting: Internal Medicine

## 2012-12-07 ENCOUNTER — Encounter: Payer: Self-pay | Admitting: Internal Medicine

## 2012-12-30 ENCOUNTER — Other Ambulatory Visit (HOSPITAL_BASED_OUTPATIENT_CLINIC_OR_DEPARTMENT_OTHER): Payer: Self-pay | Admitting: Family Medicine

## 2012-12-30 DIAGNOSIS — M545 Low back pain, unspecified: Secondary | ICD-10-CM

## 2013-01-02 ENCOUNTER — Ambulatory Visit (HOSPITAL_BASED_OUTPATIENT_CLINIC_OR_DEPARTMENT_OTHER)
Admission: RE | Admit: 2013-01-02 | Discharge: 2013-01-02 | Disposition: A | Discharge status: Home / Self Care | Payer: Medicare Other | Source: Ambulatory Visit | Attending: Family Medicine | Admitting: Family Medicine

## 2013-01-02 ENCOUNTER — Encounter (INDEPENDENT_AMBULATORY_CARE_PROVIDER_SITE_OTHER): Payer: Self-pay

## 2013-01-02 DIAGNOSIS — M5126 Other intervertebral disc displacement, lumbar region: Secondary | ICD-10-CM | POA: Insufficient documentation

## 2013-01-02 DIAGNOSIS — D1779 Benign lipomatous neoplasm of other sites: Secondary | ICD-10-CM | POA: Insufficient documentation

## 2013-01-02 DIAGNOSIS — M545 Low back pain, unspecified: Secondary | ICD-10-CM

## 2013-01-02 DIAGNOSIS — M7130 Other bursal cyst, unspecified site: Secondary | ICD-10-CM | POA: Insufficient documentation

## 2013-01-02 DIAGNOSIS — M5137 Other intervertebral disc degeneration, lumbosacral region: Secondary | ICD-10-CM | POA: Insufficient documentation

## 2013-01-02 DIAGNOSIS — M51379 Other intervertebral disc degeneration, lumbosacral region without mention of lumbar back pain or lower extremity pain: Secondary | ICD-10-CM | POA: Insufficient documentation

## 2013-01-13 ENCOUNTER — Other Ambulatory Visit: Payer: Self-pay | Admitting: Neurosurgery

## 2013-01-14 ENCOUNTER — Encounter (HOSPITAL_COMMUNITY): Payer: Self-pay | Admitting: Family Medicine

## 2013-01-14 ENCOUNTER — Encounter (HOSPITAL_COMMUNITY): Payer: Self-pay

## 2013-01-18 ENCOUNTER — Other Ambulatory Visit (HOSPITAL_COMMUNITY): Payer: Self-pay | Admitting: *Deleted

## 2013-01-18 ENCOUNTER — Encounter (HOSPITAL_COMMUNITY)
Admission: RE | Admit: 2013-01-18 | Discharge: 2013-01-18 | Disposition: A | Discharge status: Home / Self Care | Payer: Medicare Other | Source: Ambulatory Visit | Attending: Neurosurgery | Admitting: Neurosurgery

## 2013-01-18 ENCOUNTER — Encounter (HOSPITAL_COMMUNITY): Payer: Self-pay

## 2013-01-18 HISTORY — DX: Headache: R51

## 2013-01-18 HISTORY — DX: Polyp of colon: K63.5

## 2013-01-18 LAB — SURGICAL PCR SCREEN: Staphylococcus aureus: NEGATIVE

## 2013-01-18 LAB — BASIC METABOLIC PANEL
BUN: 17 mg/dL (ref 6–23)
CO2: 26 mEq/L (ref 19–32)
Chloride: 102 mEq/L (ref 96–112)
Creatinine, Ser: 0.86 mg/dL (ref 0.50–1.10)
GFR calc non Af Amer: 67 mL/min — ABNORMAL LOW (ref >90)
Glucose, Bld: 112 mg/dL — ABNORMAL HIGH (ref 70–99)

## 2013-01-18 LAB — CBC
HCT: 44.7 % (ref 36.0–46.0)
MCH: 28.4 pg (ref 26.0–34.0)
MCV: 81.9 fL (ref 78.0–100.0)
RBC: 5.46 MIL/uL — ABNORMAL HIGH (ref 3.87–5.11)
RDW: 13.9 % (ref 11.5–15.5)
WBC: 6.5 10*3/uL (ref 4.0–10.5)

## 2013-01-18 LAB — PROTIME-INR
INR: 1.04 (ref 0.00–1.49)
Prothrombin Time: 13.4 seconds (ref 11.6–15.2)

## 2013-01-18 MED ORDER — CEFAZOLIN SODIUM-DEXTROSE 2-3 GM-% IV SOLR
2.0000 g | INTRAVENOUS | Status: AC
  Administered 2013-01-19: 2 g via INTRAVENOUS
  Filled 2013-01-18: qty 50

## 2013-01-18 MED ORDER — DEXAMETHASONE SODIUM PHOSPHATE 10 MG/ML IJ SOLN
10.0000 mg | INTRAMUSCULAR | Status: AC
  Administered 2013-01-19: 10 mg via INTRAVENOUS
  Filled 2013-01-18: qty 1

## 2013-01-18 NOTE — Pre-Procedure Instructions (Signed)
HARVEY LINGO  01/18/2013   Your procedure is scheduled on:  Tuesday, January 19, 2013 at 12:50 PM.   Report to [facility] Entrance "A" at 10:50 AM.   Call this number if you have problems the morning of surgery: [phone]   Remember:   Do not eat food or drink liquids after midnight tonight, 01/18/13.   Take these medicines the morning of surgery with A SIP OF WATER: amLODipine (NORVASC), FLUoxetine (PROZAC), levothyroxine (SYNTHROID), metoprolol (LOPRESSOR), HYDROcodone-acetaminophen (NORCO) - if needed, nitroGLYCERIN (NITROSTAT) - if needed.                 Do not wear jewelry, make-up or nail polish.  Do not wear lotions, powders, or perfumes. You may wear deodorant.  Do not shave 48 hours prior to surgery.   Do not bring valuables to the hospital.  [facility] is not responsible                  for any belongings or valuables.               Contacts, dentures or bridgework may not be worn into surgery.  Leave suitcase in the car. After surgery it may be brought to your room.  For patients admitted to the hospital, discharge time is determined by your                treatment team.   Special Instructions: Shower using CHG 2 nights before surgery and the night before surgery.  If you shower the day of surgery use CHG.  Use special wash - you have one bottle of CHG for all showers.  You should use approximately 1/3 of the bottle for each shower.   Please read over the following fact sheets that you were given: Pain Booklet, Coughing and Deep Breathing, MRSA Information and Surgical Site Infection Prevention

## 2013-01-18 NOTE — Progress Notes (Signed)
01/18/13 1546  OBSTRUCTIVE SLEEP APNEA  Have you ever been diagnosed with sleep apnea through a sleep study? No  Do you snore loudly (loud enough to be heard through closed doors)?  1  Do you often feel tired, fatigued, or sleepy during the daytime? 1  Has anyone observed you stop breathing during your sleep? 0  Do you have, or are you being treated for high blood pressure? 1  BMI more than 35 kg/m2? 1  Age over 69 years old? 1  Neck circumference greater than 40 cm/18 inches? 0  Gender: 0  Obstructive Sleep Apnea Score 5  Score 4 or greater  Results sent to PCP

## 2013-01-19 ENCOUNTER — Other Ambulatory Visit: Payer: Self-pay | Admitting: *Deleted

## 2013-01-19 ENCOUNTER — Inpatient Hospital Stay (HOSPITAL_COMMUNITY): Payer: Medicare Other | Admitting: Critical Care Medicine

## 2013-01-19 ENCOUNTER — Inpatient Hospital Stay (HOSPITAL_COMMUNITY)
Admission: RE | Admit: 2013-01-19 | Discharge: 2013-01-20 | DRG: 520 | Disposition: A | Discharge status: Home / Self Care | Payer: Medicare Other | Source: Ambulatory Visit | Attending: Neurosurgery | Admitting: Neurosurgery

## 2013-01-19 ENCOUNTER — Encounter (HOSPITAL_COMMUNITY)
Admission: RE | Disposition: A | Discharge status: Home / Self Care | Payer: Self-pay | Source: Ambulatory Visit | Attending: Neurosurgery

## 2013-01-19 ENCOUNTER — Encounter (HOSPITAL_COMMUNITY): Payer: Self-pay | Admitting: Critical Care Medicine

## 2013-01-19 ENCOUNTER — Inpatient Hospital Stay (HOSPITAL_COMMUNITY): Payer: Medicare Other

## 2013-01-19 ENCOUNTER — Encounter (HOSPITAL_COMMUNITY): Payer: Medicare Other | Admitting: Critical Care Medicine

## 2013-01-19 DIAGNOSIS — K219 Gastro-esophageal reflux disease without esophagitis: Secondary | ICD-10-CM | POA: Diagnosis present

## 2013-01-19 DIAGNOSIS — F411 Generalized anxiety disorder: Secondary | ICD-10-CM | POA: Diagnosis present

## 2013-01-19 DIAGNOSIS — F329 Major depressive disorder, single episode, unspecified: Secondary | ICD-10-CM | POA: Diagnosis present

## 2013-01-19 DIAGNOSIS — I10 Essential (primary) hypertension: Secondary | ICD-10-CM | POA: Diagnosis present

## 2013-01-19 DIAGNOSIS — Z8249 Family history of ischemic heart disease and other diseases of the circulatory system: Secondary | ICD-10-CM

## 2013-01-19 DIAGNOSIS — Z823 Family history of stroke: Secondary | ICD-10-CM

## 2013-01-19 DIAGNOSIS — Z87891 Personal history of nicotine dependence: Secondary | ICD-10-CM

## 2013-01-19 DIAGNOSIS — Z01812 Encounter for preprocedural laboratory examination: Secondary | ICD-10-CM

## 2013-01-19 DIAGNOSIS — G2581 Restless legs syndrome: Secondary | ICD-10-CM | POA: Diagnosis present

## 2013-01-19 DIAGNOSIS — E782 Mixed hyperlipidemia: Secondary | ICD-10-CM | POA: Diagnosis present

## 2013-01-19 DIAGNOSIS — E039 Hypothyroidism, unspecified: Secondary | ICD-10-CM | POA: Diagnosis present

## 2013-01-19 DIAGNOSIS — I4891 Unspecified atrial fibrillation: Secondary | ICD-10-CM | POA: Diagnosis present

## 2013-01-19 DIAGNOSIS — Z7901 Long term (current) use of anticoagulants: Secondary | ICD-10-CM

## 2013-01-19 DIAGNOSIS — F3289 Other specified depressive episodes: Secondary | ICD-10-CM | POA: Diagnosis present

## 2013-01-19 DIAGNOSIS — E669 Obesity, unspecified: Secondary | ICD-10-CM | POA: Diagnosis present

## 2013-01-19 DIAGNOSIS — Z9089 Acquired absence of other organs: Secondary | ICD-10-CM

## 2013-01-19 DIAGNOSIS — M5126 Other intervertebral disc displacement, lumbar region: Principal | ICD-10-CM | POA: Diagnosis present

## 2013-01-19 DIAGNOSIS — G4700 Insomnia, unspecified: Secondary | ICD-10-CM | POA: Diagnosis present

## 2013-01-19 DIAGNOSIS — Z79899 Other long term (current) drug therapy: Secondary | ICD-10-CM

## 2013-01-19 HISTORY — PX: LUMBAR LAMINECTOMY/DECOMPRESSION MICRODISCECTOMY: SHX5026

## 2013-01-19 SURGERY — LUMBAR LAMINECTOMY/DECOMPRESSION MICRODISCECTOMY 1 LEVEL
Anesthesia: General | Site: Back | Wound class: Clean

## 2013-01-19 MED ORDER — PROPOFOL 10 MG/ML IV BOLUS
INTRAVENOUS | Status: DC | PRN
  Administered 2013-01-19: 40 mg via INTRAVENOUS
  Administered 2013-01-19: 160 mg via INTRAVENOUS

## 2013-01-19 MED ORDER — GLYCOPYRROLATE 0.2 MG/ML IJ SOLN
INTRAMUSCULAR | Status: DC | PRN
  Administered 2013-01-19: 0.4 mg via INTRAVENOUS
  Administered 2013-01-19: 0.2 mg via INTRAVENOUS

## 2013-01-19 MED ORDER — DEXAMETHASONE SODIUM PHOSPHATE 4 MG/ML IJ SOLN
4.0000 mg | Freq: Four times a day (QID) | INTRAMUSCULAR | Status: AC
  Administered 2013-01-20: 4 mg via INTRAVENOUS
  Filled 2013-01-19: qty 1

## 2013-01-19 MED ORDER — SODIUM CHLORIDE 0.9 % IV SOLN: 250.0000 mL | INTRAVENOUS | Status: DC

## 2013-01-19 MED ORDER — EPHEDRINE SULFATE 50 MG/ML IJ SOLN
INTRAMUSCULAR | Status: DC | PRN
  Administered 2013-01-19 (×2): 5 mg via INTRAVENOUS
  Administered 2013-01-19: 10 mg via INTRAVENOUS
  Administered 2013-01-19: 5 mg via INTRAVENOUS

## 2013-01-19 MED ORDER — NITROGLYCERIN 0.4 MG SL SUBL: 0.4000 mg | SUBLINGUAL_TABLET | SUBLINGUAL | Status: DC | PRN

## 2013-01-19 MED ORDER — ROCURONIUM BROMIDE 100 MG/10ML IV SOLN
INTRAVENOUS | Status: DC | PRN
  Administered 2013-01-19: 50 mg via INTRAVENOUS

## 2013-01-19 MED ORDER — ONDANSETRON HCL 4 MG/2ML IJ SOLN: 4.0000 mg | INTRAMUSCULAR | Status: DC | PRN

## 2013-01-19 MED ORDER — ZOLPIDEM TARTRATE 5 MG PO TABS: 5.0000 mg | ORAL_TABLET | Freq: Every evening | ORAL | Status: DC | PRN

## 2013-01-19 MED ORDER — SODIUM CHLORIDE 0.9 % IJ SOLN
3.0000 mL | Freq: Two times a day (BID) | INTRAMUSCULAR | Status: DC
  Administered 2013-01-19: 3 mL via INTRAVENOUS

## 2013-01-19 MED ORDER — LIDOCAINE HCL (CARDIAC) 20 MG/ML IV SOLN
INTRAVENOUS | Status: DC | PRN
  Administered 2013-01-19: 80 mg via INTRAVENOUS

## 2013-01-19 MED ORDER — METOPROLOL TARTRATE 25 MG PO TABS
25.0000 mg | ORAL_TABLET | Freq: Two times a day (BID) | ORAL | Status: DC
  Administered 2013-01-19: 25 mg via ORAL
  Filled 2013-01-19 (×3): qty 1

## 2013-01-19 MED ORDER — KCL IN DEXTROSE-NACL 20-5-0.45 MEQ/L-%-% IV SOLN
80.0000 mL/h | INTRAVENOUS | Status: DC
  Filled 2013-01-19 (×3): qty 1000

## 2013-01-19 MED ORDER — FLUOXETINE HCL 20 MG PO CAPS
20.0000 mg | ORAL_CAPSULE | Freq: Every day | ORAL | Status: DC
  Administered 2013-01-19: 20 mg via ORAL
  Filled 2013-01-19 (×2): qty 1

## 2013-01-19 MED ORDER — SODIUM CHLORIDE 0.9 % IR SOLN
Status: DC | PRN
  Administered 2013-01-19: 14:00:00

## 2013-01-19 MED ORDER — BENAZEPRIL HCL 20 MG PO TABS
20.0000 mg | ORAL_TABLET | Freq: Every day | ORAL | Status: DC
Start: 2013-01-20 — End: 2013-01-20
  Filled 2013-01-19: qty 1

## 2013-01-19 MED ORDER — HYDROMORPHONE HCL PF 1 MG/ML IJ SOLN: 1.0000 mg | INTRAMUSCULAR | Status: DC | PRN

## 2013-01-19 MED ORDER — KETOROLAC TROMETHAMINE 30 MG/ML IJ SOLN
INTRAMUSCULAR | Status: DC | PRN
  Administered 2013-01-19: 30 mg via INTRAVENOUS

## 2013-01-19 MED ORDER — ONDANSETRON HCL 4 MG/2ML IJ SOLN: 4.0000 mg | Freq: Once | INTRAMUSCULAR | Status: DC | PRN

## 2013-01-19 MED ORDER — ACETAMINOPHEN 650 MG RE SUPP: 650.0000 mg | RECTAL | Status: DC | PRN

## 2013-01-19 MED ORDER — MENTHOL 3 MG MT LOZG: 1.0000 | LOZENGE | OROMUCOSAL | Status: DC | PRN

## 2013-01-19 MED ORDER — PANTOPRAZOLE SODIUM 40 MG IV SOLR
40.0000 mg | Freq: Every day | INTRAVENOUS | Status: DC
  Administered 2013-01-19: 40 mg via INTRAVENOUS
  Filled 2013-01-19 (×2): qty 40

## 2013-01-19 MED ORDER — THROMBIN 5000 UNITS EX SOLR
CUTANEOUS | Status: DC | PRN
  Administered 2013-01-19 (×2): 5000 [IU] via TOPICAL

## 2013-01-19 MED ORDER — ACETAMINOPHEN 325 MG PO TABS: 650.0000 mg | ORAL_TABLET | ORAL | Status: DC | PRN

## 2013-01-19 MED ORDER — FENTANYL CITRATE 0.05 MG/ML IJ SOLN
INTRAMUSCULAR | Status: DC | PRN
  Administered 2013-01-19 (×2): 50 ug via INTRAVENOUS
  Administered 2013-01-19: 100 ug via INTRAVENOUS
  Administered 2013-01-19 (×3): 50 ug via INTRAVENOUS

## 2013-01-19 MED ORDER — SODIUM CHLORIDE 0.9 % IJ SOLN: 3.0000 mL | INTRAMUSCULAR | Status: DC | PRN

## 2013-01-19 MED ORDER — BUPIVACAINE HCL (PF) 0.5 % IJ SOLN
INTRAMUSCULAR | Status: DC | PRN
  Administered 2013-01-19: 10 mL

## 2013-01-19 MED ORDER — KETOROLAC TROMETHAMINE 30 MG/ML IJ SOLN
30.0000 mg | Freq: Four times a day (QID) | INTRAMUSCULAR | Status: DC
  Administered 2013-01-19 – 2013-01-20 (×3): 30 mg via INTRAVENOUS
  Filled 2013-01-19 (×7): qty 1

## 2013-01-19 MED ORDER — OXYCODONE HCL 5 MG PO TABS: 5.0000 mg | ORAL_TABLET | Freq: Once | ORAL | Status: DC | PRN

## 2013-01-19 MED ORDER — OXYCODONE HCL 5 MG PO TABS
ORAL_TABLET | ORAL | Status: AC
  Administered 2013-01-19: 5 mg
  Filled 2013-01-19: qty 1

## 2013-01-19 MED ORDER — AMLODIPINE BESYLATE 10 MG PO TABS
10.0000 mg | ORAL_TABLET | Freq: Every day | ORAL | Status: DC
Start: 2013-01-20 — End: 2013-01-20
  Filled 2013-01-19: qty 1

## 2013-01-19 MED ORDER — LACTATED RINGERS IV SOLN
INTRAVENOUS | Status: DC
  Administered 2013-01-19 (×2): via INTRAVENOUS

## 2013-01-19 MED ORDER — CEFAZOLIN SODIUM 1-5 GM-% IV SOLN
1.0000 g | Freq: Three times a day (TID) | INTRAVENOUS | Status: AC
  Administered 2013-01-19 – 2013-01-20 (×2): 1 g via INTRAVENOUS
  Filled 2013-01-19 (×2): qty 50

## 2013-01-19 MED ORDER — WARFARIN SODIUM 5 MG PO TABS: 5.0000 mg | ORAL_TABLET | ORAL | Status: DC

## 2013-01-19 MED ORDER — PHENOL 1.4 % MT LIQD: 1.0000 | OROMUCOSAL | Status: DC | PRN

## 2013-01-19 MED ORDER — HYDROCODONE-ACETAMINOPHEN 5-325 MG PO TABS: 1.0000 | ORAL_TABLET | ORAL | Status: DC | PRN

## 2013-01-19 MED ORDER — MIDAZOLAM HCL 5 MG/5ML IJ SOLN
INTRAMUSCULAR | Status: DC | PRN
  Administered 2013-01-19: 1 mg via INTRAVENOUS

## 2013-01-19 MED ORDER — 0.9 % SODIUM CHLORIDE (POUR BTL) OPTIME
TOPICAL | Status: DC | PRN
  Administered 2013-01-19: 1000 mL

## 2013-01-19 MED ORDER — NEOSTIGMINE METHYLSULFATE 1 MG/ML IJ SOLN
INTRAMUSCULAR | Status: DC | PRN
  Administered 2013-01-19 (×2): 2 mg via INTRAVENOUS

## 2013-01-19 MED ORDER — DEXAMETHASONE 4 MG PO TABS
4.0000 mg | ORAL_TABLET | Freq: Four times a day (QID) | ORAL | Status: AC
  Administered 2013-01-19: 4 mg via ORAL
  Filled 2013-01-19: qty 1

## 2013-01-19 MED ORDER — HYDROMORPHONE HCL PF 1 MG/ML IJ SOLN
0.2500 mg | INTRAMUSCULAR | Status: DC | PRN
  Administered 2013-01-19: 0.5 mg via INTRAVENOUS

## 2013-01-19 MED ORDER — HEMOSTATIC AGENTS (NO CHARGE) OPTIME
TOPICAL | Status: DC | PRN
  Administered 2013-01-19: 1 via TOPICAL

## 2013-01-19 MED ORDER — ONDANSETRON HCL 4 MG/2ML IJ SOLN
INTRAMUSCULAR | Status: DC | PRN
  Administered 2013-01-19: 4 mg via INTRAVENOUS

## 2013-01-19 MED ORDER — OXYCODONE HCL 5 MG/5ML PO SOLN: 5.0000 mg | Freq: Once | ORAL | Status: DC | PRN

## 2013-01-19 MED ORDER — LEVOTHYROXINE SODIUM 75 MCG PO TABS
75.0000 ug | ORAL_TABLET | Freq: Every day | ORAL | Status: DC
  Administered 2013-01-20: 75 ug via ORAL
  Filled 2013-01-19 (×2): qty 1

## 2013-01-19 MED ORDER — HYDROMORPHONE HCL PF 1 MG/ML IJ SOLN
INTRAMUSCULAR | Status: AC
  Filled 2013-01-19: qty 1

## 2013-01-19 SURGICAL SUPPLY — 48 items
BAG DECANTER FOR FLEXI CONT (MISCELLANEOUS) ×2 IMPLANT
BENZOIN TINCTURE PRP APPL 2/3 (GAUZE/BANDAGES/DRESSINGS) ×2 IMPLANT
BLADE SURG ROTATE 9660 (MISCELLANEOUS) ×2 IMPLANT
BRUSH SCRUB EZ PLAIN DRY (MISCELLANEOUS) ×2 IMPLANT
BUR CUTTER 7.0 ROUND (BURR) ×2 IMPLANT
BUR MATCHSTICK NEURO 3.0 LAGG (BURR) ×2 IMPLANT
CANISTER SUCT 3000ML (MISCELLANEOUS) ×2 IMPLANT
CONT SPEC 4OZ CLIKSEAL STRL BL (MISCELLANEOUS) ×2 IMPLANT
DERMABOND ADHESIVE PROPEN (GAUZE/BANDAGES/DRESSINGS) ×1
DERMABOND ADVANCED (GAUZE/BANDAGES/DRESSINGS) ×1
DERMABOND ADVANCED .7 DNX12 (GAUZE/BANDAGES/DRESSINGS) ×1 IMPLANT
DERMABOND ADVANCED .7 DNX6 (GAUZE/BANDAGES/DRESSINGS) ×1 IMPLANT
DRAPE LAPAROTOMY 100X72X124 (DRAPES) ×2 IMPLANT
DRAPE MICROSCOPE ZEISS OPMI (DRAPES) ×2 IMPLANT
DRAPE SURG 17X23 STRL (DRAPES) ×4 IMPLANT
DRESSING TELFA 8X3 (GAUZE/BANDAGES/DRESSINGS) ×2 IMPLANT
DRSG OPSITE POSTOP 4X6 (GAUZE/BANDAGES/DRESSINGS) ×2 IMPLANT
DURAPREP 26ML APPLICATOR (WOUND CARE) ×2 IMPLANT
ELECT REM PT RETURN 9FT ADLT (ELECTROSURGICAL) ×2
ELECTRODE REM PT RTRN 9FT ADLT (ELECTROSURGICAL) ×1 IMPLANT
GAUZE SPONGE 4X4 16PLY XRAY LF (GAUZE/BANDAGES/DRESSINGS) IMPLANT
GLOVE BIO SURGEON STRL SZ8 (GLOVE) ×2 IMPLANT
GLOVE BIOGEL PI IND STRL 8.5 (GLOVE) ×2 IMPLANT
GLOVE BIOGEL PI INDICATOR 8.5 (GLOVE) ×2
GLOVE ECLIPSE 8.0 STRL XLNG CF (GLOVE) ×2 IMPLANT
GLOVE INDICATOR 7.5 STRL GRN (GLOVE) ×2 IMPLANT
GLOVE SURG SS PI 7.0 STRL IVOR (GLOVE) ×4 IMPLANT
GOWN BRE IMP SLV AUR LG STRL (GOWN DISPOSABLE) IMPLANT
GOWN BRE IMP SLV AUR XL STRL (GOWN DISPOSABLE) ×4 IMPLANT
GOWN STRL REIN 2XL LVL4 (GOWN DISPOSABLE) IMPLANT
KIT BASIN OR (CUSTOM PROCEDURE TRAY) ×2 IMPLANT
KIT ROOM TURNOVER OR (KITS) ×2 IMPLANT
NEEDLE HYPO 22GX1.5 SAFETY (NEEDLE) ×2 IMPLANT
NEEDLE SPNL 22GX3.5 QUINCKE BK (NEEDLE) ×4 IMPLANT
NS IRRIG 1000ML POUR BTL (IV SOLUTION) ×2 IMPLANT
PACK LAMINECTOMY NEURO (CUSTOM PROCEDURE TRAY) ×2 IMPLANT
PAD ARMBOARD 7.5X6 YLW CONV (MISCELLANEOUS) ×6 IMPLANT
PATTIES SURGICAL .75X.75 (GAUZE/BANDAGES/DRESSINGS) ×2 IMPLANT
RUBBERBAND STERILE (MISCELLANEOUS) ×4 IMPLANT
SPONGE GAUZE 4X4 12PLY (GAUZE/BANDAGES/DRESSINGS) ×2 IMPLANT
SPONGE SURGIFOAM ABS GEL SZ50 (HEMOSTASIS) ×2 IMPLANT
STRIP CLOSURE SKIN 1/2X4 (GAUZE/BANDAGES/DRESSINGS) ×2 IMPLANT
SUT VIC AB 2-0 OS6 18 (SUTURE) ×6 IMPLANT
SUT VIC AB 3-0 CP2 18 (SUTURE) ×2 IMPLANT
SYR 20ML ECCENTRIC (SYRINGE) ×2 IMPLANT
TOWEL OR 17X24 6PK STRL BLUE (TOWEL DISPOSABLE) ×2 IMPLANT
TOWEL OR 17X26 10 PK STRL BLUE (TOWEL DISPOSABLE) ×2 IMPLANT
WATER STERILE IRR 1000ML POUR (IV SOLUTION) ×2 IMPLANT

## 2013-01-19 NOTE — Op Note (Signed)
Preop diagnosis: Herniated disc L3-4 left with superior fragment Postop diagnosis: Same Procedure: Left L3-4 intralaminar laminotomy for excision of herniated disc with the operative microscope Surgeon: Melek Pownall Assistant: Venetia Maxon  After being placed the prone position the patient's back was prepped and draped in usual sterile fashion. Localizing x-rays taken prior to incision to identify the appropriate level. Midline incision was made above the spinous processes of L3 and L4. Using Bovie cutting current the incision was carried on the spinous processes. Subperiosteal dissection was then carried out on the left side of the spinous processes and lamina and self-retaining tract was placed for exposure. X-ray showed approach the appropriate level. Using the high-speed drill the inferior 80% of the L3 lamina the medial one third of the facet joint the superior one third of the L4 lamina were removed. We bone and ligamentum flavum removed in a piecemeal fashion. The microscope was draped brought in the field and used for the remainder of the case. Using microdissection technique the lateral aspect of the thecal sac and L4 nerve were identified. Further coagulation was carried out down before the canal to identify the L3-4 disc was found to be herniated at the disc space. We then inspected superior to the disc space and discovered large amounts of free disc material as well subligamentous disc material this was removed in a piecemeal fashion. The L3 nerve root could be visualized Sunday well decompressed and herniated disc material. We then incised the disc and thoroughly cleaned out with pituitary rongeurs and curettes. At this point inspection was carried out in all directions for any evidence of residual compression and none could be identified. The L3 and L4 nerve roots were found both to be free this time. Irrigation was carried out and any bleeding control proper coagulation Gelfoam. The was then closed in  multiple layers of Vicryl on the muscle fascia subcutaneous and subcuticular tissues. Dermabond Steri-Strips were placed on the skin. Shortness was then applied the patient was extubated and taken to recovery room in stable condition.

## 2013-01-19 NOTE — Preoperative (Signed)
Beta Blockers   Reason not to administer Beta Blockers:Not Applicable, pt took lopressor 01/19/13

## 2013-01-19 NOTE — Progress Notes (Signed)
Care of pt assumed by MA Jaquilla Woodroof RN 

## 2013-01-19 NOTE — Anesthesia Postprocedure Evaluation (Signed)
  Anesthesia Post-op Note  Patient: Jennifer House  Procedure(s) Performed: Procedure(s): LUMBAR THREE FOUR LUMBAR LAMINECTOMY/DECOMPRESSION MICRODISCECTOMY 1 LEVEL (N/A)  Patient Location: PACU  Anesthesia Type:General  Level of Consciousness: awake, alert  and oriented  Airway and Oxygen Therapy: Patient Spontanous Breathing and Patient connected to nasal cannula oxygen  Post-op Pain: mild  Post-op Assessment: Post-op Vital signs reviewed  Post-op Vital Signs: Reviewed  Complications: No apparent anesthesia complications

## 2013-01-19 NOTE — Transfer of Care (Signed)
Immediate Anesthesia Transfer of Care Note  Patient: Jennifer House  Procedure(s) Performed: Procedure(s): LUMBAR THREE FOUR LUMBAR LAMINECTOMY/DECOMPRESSION MICRODISCECTOMY 1 LEVEL (N/A)  Patient Location: PACU  Anesthesia Type:General  Level of Consciousness: awake, alert , oriented, patient cooperative and responds to stimulation  Airway & Oxygen Therapy: Patient Spontanous Breathing and Patient connected to nasal cannula oxygen  Post-op Assessment: Report given to PACU RN, Post -op Vital signs reviewed and stable and Patient moving all extremities X 4  Post vital signs: Reviewed and stable  Complications: No apparent anesthesia complications

## 2013-01-19 NOTE — Telephone Encounter (Signed)
Patient for lumbar laminectomy today, received refill request on warfarin from [facility], past due on coumadin clinic, will do a partial refill.

## 2013-01-19 NOTE — Anesthesia Preprocedure Evaluation (Addendum)
Anesthesia Evaluation  Patient identified by MRN, date of birth, ID band Patient awake    Reviewed: Allergy & Precautions, H&P , NPO status , Patient's Chart, lab work & pertinent test results, reviewed documented beta blocker date and time   Airway Mallampati: I TM Distance: >3 FB Neck ROM: Full    Dental  (+) Dental Advisory Given, Edentulous Upper and Edentulous Lower   Pulmonary  breath sounds clear to auscultation        Cardiovascular hypertension, Pt. on medications + dysrhythmias Atrial Fibrillation Rhythm:Regular Rate:Normal     Neuro/Psych  Headaches, PSYCHIATRIC DISORDERS Anxiety Depression    GI/Hepatic GERD-  Medicated and Controlled,  Endo/Other  Hypothyroidism Morbid obesity  Renal/GU      Musculoskeletal   Abdominal   Peds  Hematology   Anesthesia Other Findings   Reproductive/Obstetrics                          Anesthesia Physical Anesthesia Plan  ASA: III  Anesthesia Plan: General   Post-op Pain Management:    Induction: Intravenous  Airway Management Planned: Oral ETT  Additional Equipment:   Intra-op Plan:   Post-operative Plan: Extubation in OR  Informed Consent: I have reviewed the patients History and Physical, chart, labs and discussed the procedure including the risks, benefits and alternatives for the proposed anesthesia with the patient or authorized representative who has indicated his/her understanding and acceptance.   Dental advisory given  Plan Discussed with: Anesthesiologist, Surgeon and CRNA  Anesthesia Plan Comments:        Anesthesia Quick Evaluation

## 2013-01-19 NOTE — H&P (Signed)
Jennifer House is an 67 y.o. female.   Chief Complaint: Left leg pain HPI: The patient is a 67 year old female who was evaluated in the office for left leg pain. It started about 6 weeks ago with back and left leg pain. She fell 3 weeks later he has increased dramatically. Since that time she said difficulty walking using a wheelchair roundhouse. He saw her medical doctor gave her pain medications. X-rays were done followed by an MRI scan she now comes for surgical opinion. 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 She has atrophic ablation was taking Coumadin. After discussing the options the patient requested surgery because of a herniated disc at L3-4 with a superior fragment. She's Coumadin for a week and now comes for a left L3-4 microdiscectomy. I had a long discussion with her regarding the risks and benefits of surgical intervention. The risks discussed include but are not limited to bleeding infection weakness numbness paralysis spinal fluid leak coma and death. We've discussed alternative methods of therapy offered risks and benefits of not intervention. She's had the opportunity to ask numerous questions and appears to understand. With this information in hand she has requested we proceed with surgery.  Past Medical History  Diagnosis Date  . Atrial fibrillation     permanent  . HTN (hypertension)   . HLD (hyperlipidemia)     mixed  . Hematuria     hx  . Renal calculus     hx  . Restless leg syndrome   . Osteoarthritis   . GERD (gastroesophageal reflux disease)   . Insomnia   . Anxiety   . Liver function test abnormality     hx  . Depression   . Obesity   . Hypothyroidism   . Carcinoma of bladder     transitional cell hx  . Osteopenia   . Headache(784.0)     hx of migraines  . Colon polyps     Past Surgical History  Procedure Laterality Date  . Cystocopy  10/17/03    left stent placement, TURBT  . Total abdominal  hysterectomy  1985  . Foot surgery    . Cardiac catheterization  1/12    revealed normal cors and LV function  . Colonoscopy    . Appendectomy      Family History  Problem Relation Age of Onset  . Heart failure Mother   . Stroke Father   . Stroke Brother    Social History:  reports that she quit smoking about 27 years ago. She has never used smokeless tobacco. She reports that she does not drink alcohol or use illicit drugs.  Allergies:  Allergies  Allergen Reactions  . Ibandronate Sodium Other (See Comments)    Bones hurt    Medications Prior to Admission  Medication Sig Dispense Refill  . amLODipine (NORVASC) 10 MG tablet Take 10 mg by mouth daily.      . benazepril (LOTENSIN) 20 MG tablet Take 20 mg by mouth daily.       . Calcium Carbonate (CALTRATE 600) 1500 MG TABS Take 3 tablets by mouth daily.        . cholecalciferol (VITAMIN D) 1000 UNITS tablet Take 1,000 Units by mouth daily.        Marland Kitchen FLUoxetine (PROZAC) 20 MG capsule Take 20 mg by mouth daily.       Marland Kitchen HYDROcodone-acetaminophen (NORCO) 10-325 MG per tablet Take 1 tablet by mouth every 4 (  four) hours as needed for pain.      Marland Kitchen levothyroxine (SYNTHROID) 75 MCG tablet Take 75 mcg by mouth daily.        . metoprolol (LOPRESSOR) 50 MG tablet Take 25 mg by mouth 2 (two) times daily.      . Multiple Vitamins-Minerals (CENTRUM SILVER) tablet Take 1 tablet by mouth daily.        . Omega-3 Fatty Acids (FISH OIL PO) Take 2 capsules by mouth daily.       . pravastatin (PRAVACHOL) 40 MG tablet Take 40 mg by mouth daily.       Marland Kitchen warfarin (COUMADIN) 5 MG tablet Take 1 tablet (5 mg total) by mouth as directed.  30 tablet  0  . nitroGLYCERIN (NITROSTAT) 0.4 MG SL tablet Place 0.4 mg under the tongue every 5 (five) minutes as needed for chest pain.       Marland Kitchen zolpidem (AMBIEN CR) 12.5 MG CR tablet Take 12.5 mg by mouth at bedtime as needed for sleep.        Results for orders placed during the hospital encounter of 01/18/13 (from the  past 48 hour(s))  SURGICAL PCR SCREEN     Status: None   Collection Time    01/18/13 12:41 PM      Result Value Range   MRSA, PCR NEGATIVE  NEGATIVE   Staphylococcus aureus NEGATIVE  NEGATIVE   Comment:            The Xpert SA Assay (FDA     approved for NASAL specimens     in patients over 42 years of age),     is one component of     a comprehensive surveillance     program.  Test performance has     been validated by [employer] for patients greater     than or equal to 32 year old.     It is not intended     to diagnose infection nor to     guide or monitor treatment.  BASIC METABOLIC PANEL     Status: Abnormal   Collection Time    01/18/13 12:42 PM      Result Value Range   Sodium 138  135 - 145 mEq/L   Potassium 4.3  3.5 - 5.1 mEq/L   Chloride 102  96 - 112 mEq/L   CO2 26  19 - 32 mEq/L   Glucose, Bld 112 (*) 70 - 99 mg/dL   BUN 17  6 - 23 mg/dL   Creatinine, Ser 7.82  0.50 - 1.10 mg/dL   Calcium 95.6  8.4 - 21.3 mg/dL   GFR calc non Af Amer 67 (*) >90 mL/min   GFR calc Af Amer 78 (*) >90 mL/min   Comment: (NOTE)     The eGFR has been calculated using the CKD EPI equation.     This calculation has not been validated in all clinical situations.     eGFR's persistently <90 mL/min signify possible Chronic Kidney     Disease.  CBC     Status: Abnormal   Collection Time    01/18/13 12:42 PM      Result Value Range   WBC 6.5  4.0 - 10.5 K/uL   RBC 5.46 (*) 3.87 - 5.11 MIL/uL   Hemoglobin 15.5 (*) 12.0 - 15.0 g/dL   HCT 08.6  57.8 - 46.9 %   MCV 81.9  78.0 - 100.0 fL   MCH  28.4  26.0 - 34.0 pg   MCHC 34.7  30.0 - 36.0 g/dL   RDW 16.1  09.6 - 04.5 %   Platelets 208  150 - 400 K/uL  PROTIME-INR     Status: None   Collection Time    01/18/13 12:42 PM      Result Value Range   Prothrombin Time 13.4  11.6 - 15.2 seconds   INR 1.04  0.00 - 1.49  APTT     Status: None   Collection Time    01/18/13 12:42 PM      Result Value Range   aPTT 29  24 - 37 seconds    Dg Chest 2 View  01/18/2013   CLINICAL DATA:  Preop lumbar laminectomy.  EXAM: CHEST  2 VIEW  COMPARISON:  DG CHEST 2 VIEW dated 01/23/2005  FINDINGS: Normal mediastinum and cardiac silhouette. Normal pulmonary vasculature. No evidence of effusion, infiltrate, or pneumothorax. No acute bony abnormality.  IMPRESSION: No acute cardiopulmonary process. .   Electronically Signed   By: Genevive Bi M.D.   On: 01/18/2013 15:42    Review of systems is positive for back and leg pain as well as high blood pressure thyroid disease and history of arrhythmia. She denies a problem status post has been as emphysema gastrointestinal genitourinary problems  Blood pressure 131/86, pulse 73, temperature 97.1 F (36.2 C), temperature source Oral, resp. rate 18, SpO2 99.00%.  The patient is awake or and oriented. His no facial asymmetry. Her gait cannot be tested due to severe pain. She is decreased reflexes. Her strength is apparently intact residual muscle testing Assessment/Plan Impression is that of a herniated disc L3-4 on the left with superior fragment. The plan is for a left L3-4 microdiscectomy.  Reinaldo Meeker, MD 01/19/2013, 12:53 PM

## 2013-01-20 NOTE — Progress Notes (Signed)
Pt doing well. Pt and family given D/C instructions with verbal understanding. Pt D/C'd home via wheelchair with family @ 1020 per MD order. Pt stable @ D/C, no other needs at this time. Rema Fendt, RN

## 2013-01-20 NOTE — Discharge Summary (Signed)
  Physician Discharge Summary  Patient ID: Jennifer House MRN: [id number] DOB/AGE: 1945-10-12 67 y.o.  Admit date: 01/19/2013 Discharge date: 01/20/2013  Admission Diagnoses:  Discharge Diagnoses:  Active Problems:   * No active hospital problems. *   Discharged Condition: good  Hospital Course: Surgery one day for hernaited disc. Did great. Marked improvement in pain. Wound fine. Ambulated well. Home pod 1, specific instructions given.  Consults: None  Significant Diagnostic Studies: none  Treatments: surgery: L 34 discectomy  Discharge Exam: Blood pressure 111/63, pulse 81, temperature 98.6 F (37 C), temperature source Oral, resp. rate 16, SpO2 92.00%. Incision/Wound:clean and dry; gait improved  Disposition:      Medication List    ASK your doctor about these medications       amLODipine 10 MG tablet  Commonly known as:  NORVASC  Take 10 mg by mouth daily.     benazepril 20 MG tablet  Commonly known as:  LOTENSIN  Take 20 mg by mouth daily.     CALTRATE 600 1500 MG Tabs  Generic drug:  Calcium Carbonate  Take 3 tablets by mouth daily.     CENTRUM SILVER tablet  Take 1 tablet by mouth daily.     cholecalciferol 1000 UNITS tablet  Commonly known as:  VITAMIN D  Take 1,000 Units by mouth daily.     FISH OIL PO  Take 2 capsules by mouth daily.     FLUoxetine 20 MG capsule  Commonly known as:  PROZAC  Take 20 mg by mouth daily.     HYDROcodone-acetaminophen 10-325 MG per tablet  Commonly known as:  NORCO  Take 1 tablet by mouth every 4 (four) hours as needed for pain.     metoprolol 50 MG tablet  Commonly known as:  LOPRESSOR  Take 25 mg by mouth 2 (two) times daily.     NITROSTAT 0.4 MG SL tablet  Generic drug:  nitroGLYCERIN  Place 0.4 mg under the tongue every 5 (five) minutes as needed for chest pain.     PRAVACHOL 40 MG tablet  Generic drug:  pravastatin  Take 40 mg by mouth daily.     SYNTHROID 75 MCG tablet  Generic drug:   levothyroxine  Take 75 mcg by mouth daily.     warfarin 5 MG tablet  Commonly known as:  COUMADIN  Take 1 tablet (5 mg total) by mouth as directed.     zolpidem 12.5 MG CR tablet  Commonly known as:  AMBIEN CR  Take 12.5 mg by mouth at bedtime as needed for sleep.         At home rest most of the time. Get up 9 or 10 times each day and take a 15 or 20 minute walk. No riding in the car and to your first postoperative appointment. If you have neck surgery you may shower from the chest down starting on the third postoperative day. If you had back surgery he may start showering on the third postoperative day with saran wrap wrapped around your incisional area 3 times. After the shower remove the saran wrap. Take pain medicine as needed and other medications as instructed. Call my office for an appointment.  SignedReinaldo Meeker, MD 01/20/2013, 9:00 AM

## 2013-01-21 ENCOUNTER — Other Ambulatory Visit: Payer: Self-pay

## 2013-01-22 ENCOUNTER — Encounter (HOSPITAL_COMMUNITY): Payer: Self-pay | Admitting: Neurosurgery

## 2013-02-10 ENCOUNTER — Ambulatory Visit (INDEPENDENT_AMBULATORY_CARE_PROVIDER_SITE_OTHER): Payer: Medicare Other | Admitting: Pharmacist

## 2013-02-10 DIAGNOSIS — Z7901 Long term (current) use of anticoagulants: Secondary | ICD-10-CM

## 2013-02-10 DIAGNOSIS — I4891 Unspecified atrial fibrillation: Secondary | ICD-10-CM

## 2013-02-10 LAB — POCT INR: INR: 4.8

## 2013-02-24 ENCOUNTER — Ambulatory Visit (INDEPENDENT_AMBULATORY_CARE_PROVIDER_SITE_OTHER): Payer: Medicare Other | Admitting: Pharmacist

## 2013-02-24 DIAGNOSIS — I4891 Unspecified atrial fibrillation: Secondary | ICD-10-CM

## 2013-02-24 DIAGNOSIS — Z7901 Long term (current) use of anticoagulants: Secondary | ICD-10-CM

## 2013-03-16 ENCOUNTER — Ambulatory Visit (INDEPENDENT_AMBULATORY_CARE_PROVIDER_SITE_OTHER): Payer: Medicare Other | Admitting: Pharmacist

## 2013-03-16 DIAGNOSIS — Z7901 Long term (current) use of anticoagulants: Secondary | ICD-10-CM

## 2013-03-16 DIAGNOSIS — I4891 Unspecified atrial fibrillation: Secondary | ICD-10-CM

## 2013-04-06 ENCOUNTER — Ambulatory Visit (INDEPENDENT_AMBULATORY_CARE_PROVIDER_SITE_OTHER): Payer: Medicare Other | Admitting: *Deleted

## 2013-04-06 DIAGNOSIS — I4891 Unspecified atrial fibrillation: Secondary | ICD-10-CM

## 2013-04-06 DIAGNOSIS — Z7901 Long term (current) use of anticoagulants: Secondary | ICD-10-CM

## 2013-04-06 LAB — POCT INR: INR: 2.8

## 2013-05-07 ENCOUNTER — Ambulatory Visit (INDEPENDENT_AMBULATORY_CARE_PROVIDER_SITE_OTHER): Payer: Medicare Other | Admitting: *Deleted

## 2013-05-07 DIAGNOSIS — Z7901 Long term (current) use of anticoagulants: Secondary | ICD-10-CM

## 2013-05-07 DIAGNOSIS — I4891 Unspecified atrial fibrillation: Secondary | ICD-10-CM

## 2013-05-07 LAB — POCT INR: INR: 2.5

## 2013-06-18 ENCOUNTER — Ambulatory Visit (INDEPENDENT_AMBULATORY_CARE_PROVIDER_SITE_OTHER): Payer: Medicare Other | Admitting: *Deleted

## 2013-06-18 DIAGNOSIS — Z7901 Long term (current) use of anticoagulants: Secondary | ICD-10-CM

## 2013-06-18 DIAGNOSIS — I4891 Unspecified atrial fibrillation: Secondary | ICD-10-CM

## 2013-06-18 LAB — POCT INR: INR: 2.9

## 2013-06-18 MED ORDER — WARFARIN SODIUM 5 MG PO TABS: 5.0000 mg | ORAL_TABLET | ORAL | Status: DC

## 2013-07-30 ENCOUNTER — Ambulatory Visit (INDEPENDENT_AMBULATORY_CARE_PROVIDER_SITE_OTHER): Payer: Medicare Other | Admitting: *Deleted

## 2013-07-30 DIAGNOSIS — Z7901 Long term (current) use of anticoagulants: Secondary | ICD-10-CM

## 2013-07-30 DIAGNOSIS — I4891 Unspecified atrial fibrillation: Secondary | ICD-10-CM

## 2013-07-30 LAB — POCT INR: INR: 2.9

## 2013-09-10 ENCOUNTER — Ambulatory Visit (INDEPENDENT_AMBULATORY_CARE_PROVIDER_SITE_OTHER): Payer: Medicare Other | Admitting: Pharmacist

## 2013-09-10 DIAGNOSIS — I4891 Unspecified atrial fibrillation: Secondary | ICD-10-CM

## 2013-09-10 DIAGNOSIS — Z7901 Long term (current) use of anticoagulants: Secondary | ICD-10-CM

## 2013-09-10 LAB — POCT INR: INR: 3.4

## 2013-10-12 ENCOUNTER — Other Ambulatory Visit: Payer: Self-pay | Admitting: Internal Medicine

## 2013-10-13 ENCOUNTER — Ambulatory Visit (INDEPENDENT_AMBULATORY_CARE_PROVIDER_SITE_OTHER): Payer: Medicare Other | Admitting: Pharmacist

## 2013-10-13 DIAGNOSIS — Z7901 Long term (current) use of anticoagulants: Secondary | ICD-10-CM

## 2013-10-13 DIAGNOSIS — I4891 Unspecified atrial fibrillation: Secondary | ICD-10-CM

## 2013-10-13 LAB — POCT INR: INR: 2.9

## 2013-11-10 ENCOUNTER — Ambulatory Visit (INDEPENDENT_AMBULATORY_CARE_PROVIDER_SITE_OTHER): Payer: Medicare Other | Admitting: *Deleted

## 2013-11-10 DIAGNOSIS — Z7901 Long term (current) use of anticoagulants: Secondary | ICD-10-CM

## 2013-11-10 DIAGNOSIS — I4891 Unspecified atrial fibrillation: Secondary | ICD-10-CM

## 2013-11-10 LAB — POCT INR: INR: 2.4

## 2013-12-08 ENCOUNTER — Ambulatory Visit (INDEPENDENT_AMBULATORY_CARE_PROVIDER_SITE_OTHER): Payer: Medicare Other | Admitting: Surgery

## 2013-12-08 DIAGNOSIS — I4891 Unspecified atrial fibrillation: Secondary | ICD-10-CM

## 2013-12-08 DIAGNOSIS — Z7901 Long term (current) use of anticoagulants: Secondary | ICD-10-CM

## 2013-12-08 LAB — POCT INR: INR: 3.1

## 2013-12-30 ENCOUNTER — Ambulatory Visit (INDEPENDENT_AMBULATORY_CARE_PROVIDER_SITE_OTHER): Payer: Medicare Other | Admitting: Pharmacist Clinician (PhC)/ Clinical Pharmacy Specialist

## 2013-12-30 DIAGNOSIS — Z7901 Long term (current) use of anticoagulants: Secondary | ICD-10-CM

## 2013-12-30 DIAGNOSIS — I4891 Unspecified atrial fibrillation: Secondary | ICD-10-CM

## 2013-12-30 LAB — POCT INR: INR: 2.5

## 2014-02-08 ENCOUNTER — Ambulatory Visit (INDEPENDENT_AMBULATORY_CARE_PROVIDER_SITE_OTHER): Payer: Medicare Other | Admitting: *Deleted

## 2014-02-08 DIAGNOSIS — I4891 Unspecified atrial fibrillation: Secondary | ICD-10-CM

## 2014-02-08 DIAGNOSIS — Z7901 Long term (current) use of anticoagulants: Secondary | ICD-10-CM

## 2014-02-08 LAB — POCT INR: INR: 2.3

## 2014-03-22 ENCOUNTER — Ambulatory Visit (INDEPENDENT_AMBULATORY_CARE_PROVIDER_SITE_OTHER): Payer: Medicare Other | Admitting: *Deleted

## 2014-03-22 DIAGNOSIS — I4891 Unspecified atrial fibrillation: Secondary | ICD-10-CM

## 2014-03-22 DIAGNOSIS — Z7901 Long term (current) use of anticoagulants: Secondary | ICD-10-CM

## 2014-03-22 LAB — POCT INR: INR: 2.6

## 2014-04-04 ENCOUNTER — Other Ambulatory Visit: Payer: Self-pay | Admitting: Internal Medicine

## 2014-05-03 ENCOUNTER — Ambulatory Visit (INDEPENDENT_AMBULATORY_CARE_PROVIDER_SITE_OTHER): Payer: Medicare Other | Admitting: *Deleted

## 2014-05-03 DIAGNOSIS — Z7901 Long term (current) use of anticoagulants: Secondary | ICD-10-CM

## 2014-05-03 DIAGNOSIS — I4891 Unspecified atrial fibrillation: Secondary | ICD-10-CM

## 2014-05-03 LAB — POCT INR: INR: 2.3

## 2014-05-03 MED ORDER — WARFARIN SODIUM 5 MG PO TABS: ORAL_TABLET | ORAL | Status: DC

## 2014-05-25 ENCOUNTER — Other Ambulatory Visit: Payer: Self-pay | Admitting: Family Medicine

## 2014-05-25 DIAGNOSIS — Z1231 Encounter for screening mammogram for malignant neoplasm of breast: Secondary | ICD-10-CM

## 2014-06-13 ENCOUNTER — Ambulatory Visit
Admission: RE | Admit: 2014-06-13 | Discharge: 2014-06-13 | Disposition: A | Discharge status: Home / Self Care | Payer: Medicare Other | Source: Ambulatory Visit | Attending: Family Medicine | Admitting: Family Medicine

## 2014-06-13 DIAGNOSIS — Z1231 Encounter for screening mammogram for malignant neoplasm of breast: Secondary | ICD-10-CM

## 2014-06-14 ENCOUNTER — Ambulatory Visit (INDEPENDENT_AMBULATORY_CARE_PROVIDER_SITE_OTHER): Payer: Medicare Other

## 2014-06-14 DIAGNOSIS — Z7901 Long term (current) use of anticoagulants: Secondary | ICD-10-CM | POA: Diagnosis not present

## 2014-06-14 DIAGNOSIS — I4891 Unspecified atrial fibrillation: Secondary | ICD-10-CM | POA: Diagnosis not present

## 2014-06-14 LAB — POCT INR: INR: 2.9

## 2014-08-01 ENCOUNTER — Ambulatory Visit (INDEPENDENT_AMBULATORY_CARE_PROVIDER_SITE_OTHER): Payer: Medicare Other | Admitting: *Deleted

## 2014-08-01 ENCOUNTER — Encounter: Payer: Self-pay | Admitting: Internal Medicine

## 2014-08-01 ENCOUNTER — Ambulatory Visit (INDEPENDENT_AMBULATORY_CARE_PROVIDER_SITE_OTHER): Payer: Medicare Other | Admitting: Internal Medicine

## 2014-08-01 VITALS — BP 140/70 | HR 61 | Ht 64.0 in | Wt 201.4 lb

## 2014-08-01 DIAGNOSIS — E785 Hyperlipidemia, unspecified: Secondary | ICD-10-CM | POA: Diagnosis not present

## 2014-08-01 DIAGNOSIS — Z7901 Long term (current) use of anticoagulants: Secondary | ICD-10-CM

## 2014-08-01 DIAGNOSIS — I4819 Other persistent atrial fibrillation: Secondary | ICD-10-CM

## 2014-08-01 DIAGNOSIS — R5383 Other fatigue: Secondary | ICD-10-CM | POA: Diagnosis not present

## 2014-08-01 DIAGNOSIS — R0602 Shortness of breath: Secondary | ICD-10-CM | POA: Diagnosis not present

## 2014-08-01 DIAGNOSIS — I481 Persistent atrial fibrillation: Secondary | ICD-10-CM

## 2014-08-01 DIAGNOSIS — I10 Essential (primary) hypertension: Secondary | ICD-10-CM | POA: Diagnosis not present

## 2014-08-01 DIAGNOSIS — I4891 Unspecified atrial fibrillation: Secondary | ICD-10-CM

## 2014-08-01 LAB — LIPID PANEL
CHOLESTEROL: 197 mg/dL (ref 0–200)
HDL: 48.9 mg/dL (ref >39.00)
LDL CALC: 117 mg/dL — AB (ref 0–99)
NONHDL: 148.1
Total CHOL/HDL Ratio: 4
Triglycerides: 155 mg/dL — ABNORMAL HIGH (ref 0.0–149.0)
VLDL: 31 mg/dL (ref 0.0–40.0)

## 2014-08-01 LAB — TSH: TSH: 1.56 u[IU]/mL (ref 0.35–4.50)

## 2014-08-01 LAB — CBC WITH DIFFERENTIAL/PLATELET
BASOS ABS: 0 10*3/uL (ref 0.0–0.1)
Basophils Relative: 0.7 % (ref 0.0–3.0)
EOS PCT: 1.3 % (ref 0.0–5.0)
Eosinophils Absolute: 0.1 10*3/uL (ref 0.0–0.7)
HEMATOCRIT: 42.3 % (ref 36.0–46.0)
HEMOGLOBIN: 14.2 g/dL (ref 12.0–15.0)
LYMPHS PCT: 24.1 % (ref 12.0–46.0)
Lymphs Abs: 1.7 10*3/uL (ref 0.7–4.0)
MCHC: 33.6 g/dL (ref 30.0–36.0)
MCV: 80.8 fl (ref 78.0–100.0)
Monocytes Absolute: 0.5 10*3/uL (ref 0.1–1.0)
Monocytes Relative: 6.7 % (ref 3.0–12.0)
NEUTROS ABS: 4.8 10*3/uL (ref 1.4–7.7)
Neutrophils Relative %: 67.2 % (ref 43.0–77.0)
Platelets: 210 10*3/uL (ref 150.0–400.0)
RBC: 5.24 Mil/uL — AB (ref 3.87–5.11)
RDW: 14 % (ref 11.5–15.5)
WBC: 7.2 10*3/uL (ref 4.0–10.5)

## 2014-08-01 LAB — COMPREHENSIVE METABOLIC PANEL
ALK PHOS: 77 U/L (ref 39–117)
ALT: 36 U/L — ABNORMAL HIGH (ref 0–35)
AST: 30 U/L (ref 0–37)
Albumin: 4.3 g/dL (ref 3.5–5.2)
BILIRUBIN TOTAL: 0.6 mg/dL (ref 0.2–1.2)
BUN: 13 mg/dL (ref 6–23)
CO2: 28 mEq/L (ref 19–32)
Calcium: 10.1 mg/dL (ref 8.4–10.5)
Chloride: 102 mEq/L (ref 96–112)
Creatinine, Ser: 0.91 mg/dL (ref 0.40–1.20)
GFR: 64.78 mL/min (ref >60.00)
GLUCOSE: 102 mg/dL — AB (ref 70–99)
POTASSIUM: 4.2 meq/L (ref 3.5–5.1)
Sodium: 136 mEq/L (ref 135–145)
TOTAL PROTEIN: 7.5 g/dL (ref 6.0–8.3)

## 2014-08-01 LAB — POCT INR: INR: 2.6

## 2014-08-01 NOTE — Progress Notes (Signed)
PCP: Orpah Melter, MD  The patient presents today for routine electrophysiology followup.  Since last being seen in our clinic, the patient reports doing reasonably well.  She has been asymptomatic with afib previously and has been treated with a rate control strategy.  She is primarily limited by her back and arthritis.  She has noticed shortness of breath with exertion for 2 months (none previously).  Today, she denies symptoms of shortness of breath at rest, CP, orthopnea, PND, lower extremity edema, dizziness, presyncope, syncope, or neurologic sequela.  The patient feels that she is tolerating medications without difficulties and is otherwise without complaint today.   Past Medical History  Diagnosis Date  . Atrial fibrillation     longstanding persistent  . HTN (hypertension)   . HLD (hyperlipidemia)     mixed  . Hematuria     hx  . Renal calculus     hx  . Restless leg syndrome   . Osteoarthritis   . GERD (gastroesophageal reflux disease)   . Insomnia   . Anxiety   . Liver function test abnormality     hx  . Depression   . Obesity   . Hypothyroidism   . Carcinoma of bladder     transitional cell hx  . Osteopenia   . Headache(784.0)     hx of migraines  . Colon polyps    Past Surgical History  Procedure Laterality Date  . Cystocopy  10/17/03    left stent placement, TURBT  . Total abdominal hysterectomy  1985  . Foot surgery    . Cardiac catheterization  1/12    revealed normal cors and LV function  . Colonoscopy    . Appendectomy    . Lumbar laminectomy/decompression microdiscectomy N/A 01/19/2013    Procedure: LUMBAR THREE FOUR LUMBAR LAMINECTOMY/DECOMPRESSION MICRODISCECTOMY 1 LEVEL;  Surgeon: Faythe Ghee, MD;  Location: [facility];  Service: Neurosurgery;  Laterality: N/A;    Current Outpatient Prescriptions  Medication Sig Dispense Refill  . amLODipine (NORVASC) 10 MG tablet Take 10 mg by mouth daily.    . benazepril (LOTENSIN) 20 MG tablet Take 20 mg  by mouth daily.     . Calcium Carbonate (CALTRATE 600) 1500 MG TABS Take 3 tablets by mouth daily.      . cholecalciferol (VITAMIN D) 1000 UNITS tablet Take 1,000 Units by mouth daily.      Marland Kitchen ezetimibe (ZETIA) 10 MG tablet Take 10 mg by mouth daily.    Marland Kitchen FLUoxetine (PROZAC) 20 MG capsule Take 20 mg by mouth daily.     Marland Kitchen HYDROcodone-acetaminophen (NORCO) 10-325 MG per tablet Take 1 tablet by mouth every 4 (four) hours as needed for pain.    Marland Kitchen levothyroxine (SYNTHROID) 75 MCG tablet Take 75 mcg by mouth daily.      . metoprolol (LOPRESSOR) 50 MG tablet Take 25 mg by mouth 2 (two) times daily.    . Multiple Vitamins-Minerals (CENTRUM SILVER) tablet Take 1 tablet by mouth daily.      . nitroGLYCERIN (NITROSTAT) 0.4 MG SL tablet Place 0.4 mg under the tongue every 5 (five) minutes as needed for chest pain (MAX 3 TABLETS).     . Omega-3 Fatty Acids (FISH OIL PO) Take 2 capsules by mouth daily.     . traZODone (DESYREL) 50 MG tablet Take 1 to 2 tablets by mouth at bedtime as needed for sleep    . warfarin (COUMADIN) 5 MG tablet Take 1 to 1.5 tablets daily  as  directed by coumadin clinic 40 tablet 3   No current facility-administered medications for this visit.    Allergies  Allergen Reactions  . Ibandronate Sodium Other (See Comments)    Bones hurt    History   Social History  . Marital Status: Married    Spouse Name: N/A  . Number of Children: N/A  . Years of Education: N/A   Occupational History  . Not on file.   Social History Main Topics  . Smoking status: Former Smoker    Quit date: 03/18/1985  . Smokeless tobacco: Never Used     Comment: denies   . Alcohol Use: No  . Drug Use: No  . Sexual Activity: Not on file   Other Topics Concern  . Not on file   Social History Narrative   Lives in [place], with husband.     Family History  Problem Relation Age of Onset  . Heart failure Mother   . Stroke Father   . Stroke Brother    Physical Exam: Filed Vitals:    08/01/14 0941  BP: 140/70  Pulse: 61  Height: 5\' 4"  (1.626 m)  Weight: 201 lb 6.4 oz (91.354 kg)    GEN- The patient is well appearing, alert and oriented x 3 today.   Head- normocephalic, atraumatic Eyes-  Sclera clear, conjunctiva pink Ears- hearing intact Oropharynx- clear Neck- supple, no JVP Lymph- no cervical lymphadenopathy Lungs- Clear to ausculation bilaterally, normal work of breathing Heart- irregular rate and rhythm, no murmurs, rubs or gallops, PMI not laterally displaced GI- soft, NT, ND, + BS Extremities- no clubbing, cyanosis, or edema MS- no significant deformity or atrophy  ekg today reveals afib, V rate 61 bpm, nonspecific St/ t changes  Assessment and Plan:  1. Longstanding persistent afib Well rate controlled Continue anticoagulation with coumadin long term  2. HTN Stable No change required today bmet today  3. SOB This is a new problem Not volume overloaded by exam today and previously asymptomatic with afib Will obtain an echo and myoview Follow-up with Richardson Dopp in 2 weeks for results Also obtain cbc, bmet, tsh to look for other causes of SOB today  4. HL Fasting lipids today

## 2014-08-01 NOTE — Patient Instructions (Signed)
Medication Instructions:  Your physician recommends that you continue on your current medications as directed. Please refer to the Current Medication list given to you today.   Labwork: Your physician recommends that you return for lab work TODAY (TSH/CBC/CMET/FASTING LIPIDS)   Testing/Procedures: Your physician has requested that you have an echocardiogram. Echocardiography is a painless test that uses sound waves to create images of your heart. It provides your doctor with information about the size and shape of your heart and how well your heart's chambers and valves are working. This procedure takes approximately one hour. There are no restrictions for this procedure.  Your physician has requested that you have a lexiscan myoview. For further information please visit [email or web address]. Please follow instruction sheet, as given.   Follow-Up: Your physician recommends that you schedule a follow-up appointment in: 2 WEEKS with Richardson Dopp, PA   Any Other Special Instructions Will Be Listed Below (If Applicable).

## 2014-08-11 ENCOUNTER — Telehealth (HOSPITAL_COMMUNITY): Payer: Self-pay

## 2014-08-11 NOTE — Telephone Encounter (Signed)
Patient given detailed instructions per Myocardial Perfusion Study Information Sheet for test on 08-16-2014 at 9:00am.  Patient verbalized understanding. Oletta Lamas, Leaner Morici A

## 2014-08-16 ENCOUNTER — Ambulatory Visit (HOSPITAL_BASED_OUTPATIENT_CLINIC_OR_DEPARTMENT_OTHER): Payer: Medicare Other

## 2014-08-16 ENCOUNTER — Ambulatory Visit (HOSPITAL_COMMUNITY): Payer: Medicare Other | Attending: Cardiovascular Disease

## 2014-08-16 ENCOUNTER — Other Ambulatory Visit: Payer: Self-pay

## 2014-08-16 DIAGNOSIS — R0600 Dyspnea, unspecified: Secondary | ICD-10-CM | POA: Insufficient documentation

## 2014-08-16 DIAGNOSIS — I4891 Unspecified atrial fibrillation: Secondary | ICD-10-CM | POA: Insufficient documentation

## 2014-08-16 DIAGNOSIS — R0602 Shortness of breath: Secondary | ICD-10-CM

## 2014-08-16 DIAGNOSIS — E785 Hyperlipidemia, unspecified: Secondary | ICD-10-CM | POA: Diagnosis not present

## 2014-08-16 DIAGNOSIS — R5383 Other fatigue: Secondary | ICD-10-CM

## 2014-08-16 LAB — MYOCARDIAL PERFUSION IMAGING
CHL CUP STRESS STAGE 1 GRADE: 0 %
CHL CUP STRESS STAGE 2 HR: 67 {beats}/min
CHL CUP STRESS STAGE 2 SPEED: 0 mph
CHL CUP STRESS STAGE 3 DBP: 100 mmHg
CHL CUP STRESS STAGE 3 GRADE: 0 %
CHL CUP STRESS STAGE 4 HR: 101 {beats}/min
CHL CUP STRESS STAGE 5 DBP: 98 mmHg
CHL CUP STRESS STAGE 5 SBP: 157 mmHg
CHL CUP STRESS STAGE 6 GRADE: 0 %
CHL CUP STRESS STAGE 6 SPEED: 0 mph
CSEPPMHR: 67 %
Estimated workload: 1 METS
LV dias vol: 64 mL
LVSYSVOL: 27 mL
NUC STRESS EF: 58 %
Peak HR: 101 {beats}/min
RATE: 0.41
Rest HR: 67 {beats}/min
SDS: 0
SRS: 6
SSS: 6
Stage 1 DBP: 81 mmHg
Stage 1 HR: 67 {beats}/min
Stage 1 SBP: 147 mmHg
Stage 1 Speed: 0 mph
Stage 2 Grade: 0 %
Stage 3 HR: 90 {beats}/min
Stage 3 SBP: 133 mmHg
Stage 3 Speed: 0 mph
Stage 4 Grade: 0 %
Stage 4 Speed: 0 mph
Stage 5 Grade: 0 %
Stage 5 HR: 90 {beats}/min
Stage 5 Speed: 0 mph
Stage 6 DBP: 89 mmHg
Stage 6 HR: 81 {beats}/min
Stage 6 SBP: 155 mmHg
TID: 0.85

## 2014-08-16 MED ORDER — TECHNETIUM TC 99M SESTAMIBI GENERIC - CARDIOLITE
33.0000 | Freq: Once | INTRAVENOUS | Status: AC | PRN
  Administered 2014-08-16: 33 via INTRAVENOUS

## 2014-08-16 MED ORDER — TECHNETIUM TC 99M SESTAMIBI GENERIC - CARDIOLITE
11.0000 | Freq: Once | INTRAVENOUS | Status: AC | PRN
  Administered 2014-08-16: 11 via INTRAVENOUS

## 2014-08-16 MED ORDER — REGADENOSON 0.4 MG/5ML IV SOLN
0.4000 mg | Freq: Once | INTRAVENOUS | Status: AC
  Administered 2014-08-16: 0.4 mg via INTRAVENOUS

## 2014-08-28 NOTE — Progress Notes (Signed)
Cardiology Office Note   Date:  08/29/2014   ID:  KASARAH SITTS, DOB 14-Feb-1946, MRN [id number]  PCP:  Orpah Melter, MD  Cardiologist/Electrophysiologist:  Dr. Thompson Grayer   Chief Complaint  Patient presents with  . Shortness of Breath     History of Present Illness: MAECY PODGURSKI is a 69 y.o. female with a hx of chronic atrial fibrillation, HTN, HL, bladder CA, GERD, degenerative disc disease status post prior spine surgery. Last seen by Dr. Rayann Heman 07/2014. Atrial fibrillation remained controlled. She complained of shortness of breath which was a new problem. Echocardiogram was obtained and demonstrated mild LVH with normal LV function and no significant valvular abnormalities. Myoview was obtained. This demonstrated an inferolateral defect that was nonreversible. There was no ischemia and this was felt to be a low risk study with normal LV function. Dr. Rayann Heman reviewed the results and determine no further workup was necessary. Of note, no wall motion and amount is were noted on echocardiogram. Labs were obtained. Patient had normal renal function, normal hemoglobin and normal TSH.  She returns for follow-up.  She continues to be short of breath with exertion. She is NYHA 2b. She denies orthopnea, PND or significant edema. She denies chest pain. Shortness of breath came on gradually over months. She does have a significant smoking history but quit many years ago. She denies any cough or wheezing. She does admit to snoring.   Studies/Reports Reviewed Today:  Myoview 08/16/14 Study Impression Myocardial perfusion is abnormal. Findings consistent with prior myocardial infarction. This is a low risk study. Overall left ventricular systolic function was normal. LV cavity size is normal. The left ventricular ejection fraction is normal (55-65%). There is no prior study for comparison.  Echo 08/16/14 - mild LVH. There was focal basalhypertrophy. EF 60% to 65%. Wall motion was normal;    - Aortic valve: There was mild regurgitation. - Mitral valve: Mildly to moderately calcified annulus. Mildlythickened leaflets . There was mild regurgitation. - Left atrium: The atrium was mildly dilated. - Right atrium: The atrium was mildly dilated.  LHC 03/2010 LM: normal LAD:  Normal LCx:  Normal RCA:  Normal EF 65%   Past Medical History  Diagnosis Date  . Atrial fibrillation     longstanding persistent  . HTN (hypertension)   . HLD (hyperlipidemia)     mixed  . Hematuria     hx  . Renal calculus     hx  . Restless leg syndrome   . Osteoarthritis   . GERD (gastroesophageal reflux disease)   . Insomnia   . Anxiety   . Liver function test abnormality     hx  . Depression   . Obesity   . Hypothyroidism   . Carcinoma of bladder     transitional cell hx  . Osteopenia   . Headache(784.0)     hx of migraines  . Colon polyps     Past Surgical History  Procedure Laterality Date  . Cystocopy  10/17/03    left stent placement, TURBT  . Total abdominal hysterectomy  1985  . Foot surgery    . Cardiac catheterization  1/12    revealed normal cors and LV function  . Colonoscopy    . Appendectomy    . Lumbar laminectomy/decompression microdiscectomy N/A 01/19/2013    Procedure: LUMBAR THREE FOUR LUMBAR LAMINECTOMY/DECOMPRESSION MICRODISCECTOMY 1 LEVEL;  Surgeon: Faythe Ghee, MD;  Location: [facility];  Service: Neurosurgery;  Laterality: N/A;  Current Outpatient Prescriptions  Medication Sig Dispense Refill  . amLODipine (NORVASC) 10 MG tablet Take 10 mg by mouth daily.    . benazepril (LOTENSIN) 20 MG tablet Take 20 mg by mouth daily.     . Calcium Carbonate (CALTRATE 600) 1500 MG TABS Take 3 tablets by mouth daily.      . cholecalciferol (VITAMIN D) 1000 UNITS tablet Take 1,000 Units by mouth daily.      Marland Kitchen ezetimibe (ZETIA) 10 MG tablet Take 10 mg by mouth daily.    Marland Kitchen FLUoxetine (PROZAC) 20 MG capsule Take 20 mg by mouth daily.     Marland Kitchen  HYDROcodone-acetaminophen (NORCO) 10-325 MG per tablet Take 1 tablet by mouth every 4 (four) hours as needed for pain.    Marland Kitchen levothyroxine (SYNTHROID) 75 MCG tablet Take 75 mcg by mouth daily.      . metoprolol (LOPRESSOR) 50 MG tablet Take 25 mg by mouth 2 (two) times daily.    . Multiple Vitamins-Minerals (CENTRUM SILVER) tablet Take 1 tablet by mouth daily.      . nitroGLYCERIN (NITROSTAT) 0.4 MG SL tablet Place 0.4 mg under the tongue every 5 (five) minutes as needed for chest pain (MAX 3 TABLETS).     . Omega-3 Fatty Acids (FISH OIL PO) Take 2 capsules by mouth daily.     . traZODone (DESYREL) 50 MG tablet Take 1 to 2 tablets by mouth at bedtime as needed for sleep    . warfarin (COUMADIN) 5 MG tablet Take 1 to 1.5 tablets daily  as directed by coumadin clinic 40 tablet 3   No current facility-administered medications for this visit.    Allergies:   Ibandronate sodium    Social History:  The patient  reports that she quit smoking about 29 years ago. She has never used smokeless tobacco. She reports that she does not drink alcohol or use illicit drugs.   Family History:  The patient's family history includes Heart attack in her mother; Heart failure in her mother; Hypertension in her father and mother; Stroke in her brother and father.    ROS:   Please see the history of present illness.   Review of Systems  Constitution: Positive for malaise/fatigue.  Cardiovascular: Positive for dyspnea on exertion, irregular heartbeat and leg swelling.  Respiratory: Positive for snoring.   Hematologic/Lymphatic: Bruises/bleeds easily.  Musculoskeletal: Positive for back pain and joint pain.  All other systems reviewed and are negative.     PHYSICAL EXAM: VS:  BP 138/62 mmHg  Pulse 59  Ht 5\' 4"  (1.626 m)  Wt 200 lb (90.719 kg)  BMI 34.31 kg/m2    Wt Readings from Last 3 Encounters:  08/29/14 200 lb (90.719 kg)  08/16/14 198 lb (89.812 kg)  08/01/14 201 lb 6.4 oz (91.354 kg)     GEN:  Well nourished, well developed, in no acute distress HEENT: normal Neck: no JVD, no masses Cardiac:  Normal S1/S2, irregularly irregular rhythm; no murmur,  no rubs or gallops, no edema   Respiratory:  clear to auscultation bilaterally, no wheezing, rhonchi or rales. GI: soft, nontender, nondistended, + BS MS: no deformity or atrophy Skin: warm and dry  Neuro:  CNs II-XII intact, Strength and sensation are intact Psych: Normal affect   EKG:  EKG is ordered today.  It demonstrates:   Atrial fibrillation, HR 59   Recent Labs: 08/01/2014: ALT 36*; BUN 13; Creatinine, Ser 0.91; Hemoglobin 14.2; Platelets 210.0; Potassium 4.2; Sodium 136; TSH 1.56  Lipid Panel    Component Value Date/Time   CHOL 197 08/01/2014 1100   TRIG 155.0* 08/01/2014 1100   HDL 48.90 08/01/2014 1100   CHOLHDL 4 08/01/2014 1100   VLDL 31.0 08/01/2014 1100   LDLCALC 117* 08/01/2014 1100      ASSESSMENT AND PLAN:  Shortness of breath: Etiology not entirely clear. Echocardiogram demonstrates normal LV function and no significant valvular abnormalities. She does have a history of normal coronary arteries by cardiac catheterization in 2012. Recent Myoview low risk without ischemia. She does have a long smoking history. However, her symptoms of shortness of breath started gradually over last several months and she quit smoking many years ago. She does not have any wheezing on exam. She does not look particularly volume overloaded. She does have some mild LVH on echocardiogram. Question if she may have some mild diastolic dysfunction contributing. She also has a snoring history. Question if she may have sleep apnea that overall contributes to fatigue and therefore increased shortness of breath.    -  obtain sleep study  -  Obtain BNP today. If significantly elevated, start low-dose diuretic.  -  Obtain chest x-ray  -  If the above unremarkable, follow-up with primary care for further evaluation.  Chronic atrial  fibrillation:  Rate is controlled. She remains on Coumadin for anticoagulation.  of note, her heart rate is 59. Could consider decreasing metoprolol over time if her symptoms continue without significant findings on testing.   Essential hypertension:  Controlled.   HYPERLIPIDEMIA, MIXED :  Continue Zetia.   Current medicines are reviewed at length with the patient today.  Concerns regarding medicines are as outlined above.  The following changes have been made:    None    Labs/ tests ordered today include:  Orders Placed This Encounter  Procedures  . DG Chest 2 View  . B Nat Peptide  . EKG 12-Lead  . Split night study    Disposition:   FU with Dr. Thompson Grayer 6 mos.    Signed, Versie Starks, MHS 08/29/2014 10:19 AM    [facility] [address], [place], [place]  [address] Phone: [phone]; Fax: [phone]

## 2014-08-29 ENCOUNTER — Ambulatory Visit (INDEPENDENT_AMBULATORY_CARE_PROVIDER_SITE_OTHER): Payer: Medicare Other | Admitting: Physician Assistant

## 2014-08-29 ENCOUNTER — Ambulatory Visit
Admission: RE | Admit: 2014-08-29 | Discharge: 2014-08-29 | Disposition: A | Discharge status: Home / Self Care | Payer: Medicare Other | Source: Ambulatory Visit | Attending: Physician Assistant | Admitting: Physician Assistant

## 2014-08-29 ENCOUNTER — Other Ambulatory Visit: Payer: Self-pay | Admitting: *Deleted

## 2014-08-29 ENCOUNTER — Encounter: Payer: Self-pay | Admitting: Physician Assistant

## 2014-08-29 ENCOUNTER — Telehealth: Payer: Self-pay | Admitting: *Deleted

## 2014-08-29 VITALS — BP 138/62 | HR 59 | Ht 64.0 in | Wt 200.0 lb

## 2014-08-29 DIAGNOSIS — I10 Essential (primary) hypertension: Secondary | ICD-10-CM | POA: Diagnosis not present

## 2014-08-29 DIAGNOSIS — I482 Chronic atrial fibrillation, unspecified: Secondary | ICD-10-CM

## 2014-08-29 DIAGNOSIS — R0602 Shortness of breath: Secondary | ICD-10-CM

## 2014-08-29 DIAGNOSIS — Z87891 Personal history of nicotine dependence: Secondary | ICD-10-CM

## 2014-08-29 DIAGNOSIS — R0683 Snoring: Secondary | ICD-10-CM

## 2014-08-29 DIAGNOSIS — Z720 Tobacco use: Secondary | ICD-10-CM

## 2014-08-29 DIAGNOSIS — E782 Mixed hyperlipidemia: Secondary | ICD-10-CM | POA: Diagnosis not present

## 2014-08-29 LAB — BRAIN NATRIURETIC PEPTIDE: PRO B NATRI PEPTIDE: 248 pg/mL — AB (ref 0.0–100.0)

## 2014-08-29 MED ORDER — WARFARIN SODIUM 5 MG PO TABS
ORAL_TABLET | ORAL | Status: DC
Start: 2014-08-29 — End: 2015-05-10

## 2014-08-29 MED ORDER — FUROSEMIDE 20 MG PO TABS: 20.0000 mg | ORAL_TABLET | ORAL | Status: DC

## 2014-08-29 NOTE — Telephone Encounter (Signed)
Refill done as requested 

## 2014-08-29 NOTE — Patient Instructions (Addendum)
Medication Instructions:  REFILLS SENT IN FOR METOPROLOL  I WILL HAVE THE COUMADIN CLINIC SEND IN YOUR REFILL FOR COUMADIN  Labwork: TODAY BNP  Testing/Procedures: 1. A chest x-ray takes a picture of the organs and structures inside the chest, including the heart, lungs, and blood vessels. This test can show several things, including, whether the heart is enlarges; whether fluid is building up in the lungs; and whether pacemaker / defibrillator leads are still in place.  2. Your physician has recommended that you have a SPLIT NIGHT sleep study. This test records several body functions during sleep, including: brain activity, eye movement, oxygen and carbon dioxide blood levels, heart rate and rhythm, breathing rate and rhythm, the flow of air through your mouth and nose, snoring, body muscle movements, and chest and belly movement.  Follow-Up: Your physician wants you to follow-up in: [place] DR. ALLRED You will receive a reminder letter in the mail two months in advance. If you don't receive a letter, please call our office to schedule the follow-up appointment.   Any Other Special Instructions Will Be Listed Below (If Applicable).  YOU WILL NEED TO FOLLOW UP WITH PRIMARY CARE ABOUT YOUR SHORTNESS OF BREATH

## 2014-08-29 NOTE — Telephone Encounter (Signed)
Pt notified of cxr and lab results and with med change to start lasix 20 mg on mon, wed, and fri's, bmet 6/27 when she come in for CVRR appt. Pt advised to monitor weight and call if wt is up. Pt agreeable to plan of care.

## 2014-09-12 ENCOUNTER — Ambulatory Visit (INDEPENDENT_AMBULATORY_CARE_PROVIDER_SITE_OTHER): Payer: Medicare Other | Admitting: *Deleted

## 2014-09-12 ENCOUNTER — Other Ambulatory Visit (INDEPENDENT_AMBULATORY_CARE_PROVIDER_SITE_OTHER): Payer: Medicare Other | Admitting: *Deleted

## 2014-09-12 ENCOUNTER — Telehealth: Payer: Self-pay | Admitting: *Deleted

## 2014-09-12 DIAGNOSIS — I4891 Unspecified atrial fibrillation: Secondary | ICD-10-CM

## 2014-09-12 DIAGNOSIS — R0602 Shortness of breath: Secondary | ICD-10-CM | POA: Diagnosis not present

## 2014-09-12 DIAGNOSIS — Z7901 Long term (current) use of anticoagulants: Secondary | ICD-10-CM | POA: Diagnosis not present

## 2014-09-12 DIAGNOSIS — I10 Essential (primary) hypertension: Secondary | ICD-10-CM

## 2014-09-12 LAB — BASIC METABOLIC PANEL
BUN: 18 mg/dL (ref 6–23)
CO2: 29 mEq/L (ref 19–32)
CREATININE: 0.83 mg/dL (ref 0.40–1.20)
Calcium: 9.5 mg/dL (ref 8.4–10.5)
Chloride: 105 mEq/L (ref 96–112)
GFR: 72.02 mL/min (ref >60.00)
Glucose, Bld: 98 mg/dL (ref 70–99)
Potassium: 4 mEq/L (ref 3.5–5.1)
Sodium: 137 mEq/L (ref 135–145)

## 2014-09-12 LAB — POCT INR: INR: 2.2

## 2014-09-12 NOTE — Telephone Encounter (Signed)
Pt notified of lab results with verbal understanding by phone. 

## 2014-10-26 ENCOUNTER — Ambulatory Visit (INDEPENDENT_AMBULATORY_CARE_PROVIDER_SITE_OTHER): Payer: Medicare Other | Admitting: *Deleted

## 2014-10-26 DIAGNOSIS — I4891 Unspecified atrial fibrillation: Secondary | ICD-10-CM | POA: Diagnosis not present

## 2014-10-26 DIAGNOSIS — Z7901 Long term (current) use of anticoagulants: Secondary | ICD-10-CM | POA: Diagnosis not present

## 2014-10-26 LAB — POCT INR: INR: 2.4

## 2014-12-09 ENCOUNTER — Ambulatory Visit (INDEPENDENT_AMBULATORY_CARE_PROVIDER_SITE_OTHER): Payer: Medicare Other | Admitting: Pharmacist

## 2014-12-09 DIAGNOSIS — Z7901 Long term (current) use of anticoagulants: Secondary | ICD-10-CM | POA: Diagnosis not present

## 2014-12-09 DIAGNOSIS — I4891 Unspecified atrial fibrillation: Secondary | ICD-10-CM

## 2014-12-09 LAB — POCT INR: INR: 2.8

## 2015-01-20 ENCOUNTER — Ambulatory Visit (INDEPENDENT_AMBULATORY_CARE_PROVIDER_SITE_OTHER): Payer: Medicare Other | Admitting: *Deleted

## 2015-01-20 DIAGNOSIS — Z7901 Long term (current) use of anticoagulants: Secondary | ICD-10-CM | POA: Diagnosis not present

## 2015-01-20 DIAGNOSIS — I482 Chronic atrial fibrillation, unspecified: Secondary | ICD-10-CM

## 2015-01-20 DIAGNOSIS — I4891 Unspecified atrial fibrillation: Secondary | ICD-10-CM

## 2015-01-20 LAB — POCT INR: INR: 2.5

## 2015-01-23 ENCOUNTER — Ambulatory Visit (INDEPENDENT_AMBULATORY_CARE_PROVIDER_SITE_OTHER): Payer: Medicare Other | Admitting: Family Medicine

## 2015-01-23 ENCOUNTER — Encounter: Payer: Self-pay | Admitting: Family Medicine

## 2015-01-23 ENCOUNTER — Other Ambulatory Visit: Payer: Self-pay | Admitting: Family Medicine

## 2015-01-23 ENCOUNTER — Telehealth: Payer: Self-pay | Admitting: Family Medicine

## 2015-01-23 VITALS — BP 131/82 | HR 66 | Temp 97.5°F | Resp 20 | Ht 64.75 in | Wt 191.0 lb

## 2015-01-23 DIAGNOSIS — Z23 Encounter for immunization: Secondary | ICD-10-CM | POA: Diagnosis not present

## 2015-01-23 DIAGNOSIS — Z0000 Encounter for general adult medical examination without abnormal findings: Secondary | ICD-10-CM

## 2015-01-23 DIAGNOSIS — F32A Depression, unspecified: Secondary | ICD-10-CM

## 2015-01-23 DIAGNOSIS — E034 Atrophy of thyroid (acquired): Secondary | ICD-10-CM | POA: Diagnosis not present

## 2015-01-23 DIAGNOSIS — I482 Chronic atrial fibrillation, unspecified: Secondary | ICD-10-CM

## 2015-01-23 DIAGNOSIS — G4700 Insomnia, unspecified: Secondary | ICD-10-CM | POA: Diagnosis not present

## 2015-01-23 DIAGNOSIS — I10 Essential (primary) hypertension: Secondary | ICD-10-CM | POA: Diagnosis not present

## 2015-01-23 DIAGNOSIS — L821 Other seborrheic keratosis: Secondary | ICD-10-CM | POA: Insufficient documentation

## 2015-01-23 DIAGNOSIS — E038 Other specified hypothyroidism: Secondary | ICD-10-CM

## 2015-01-23 DIAGNOSIS — Z7689 Persons encountering health services in other specified circumstances: Secondary | ICD-10-CM

## 2015-01-23 DIAGNOSIS — F329 Major depressive disorder, single episode, unspecified: Secondary | ICD-10-CM

## 2015-01-23 DIAGNOSIS — Z7189 Other specified counseling: Secondary | ICD-10-CM

## 2015-01-23 DIAGNOSIS — E875 Hyperkalemia: Secondary | ICD-10-CM

## 2015-01-23 LAB — CBC WITH DIFFERENTIAL/PLATELET
BASOS ABS: 0 10*3/uL (ref 0.0–0.1)
Basophils Relative: 0.6 % (ref 0.0–3.0)
EOS ABS: 0.1 10*3/uL (ref 0.0–0.7)
Eosinophils Relative: 1.7 % (ref 0.0–5.0)
HCT: 43.8 % (ref 36.0–46.0)
HEMOGLOBIN: 14.1 g/dL (ref 12.0–15.0)
LYMPHS ABS: 2.1 10*3/uL (ref 0.7–4.0)
Lymphocytes Relative: 32.4 % (ref 12.0–46.0)
MCHC: 32.2 g/dL (ref 30.0–36.0)
MCV: 83.3 fl (ref 78.0–100.0)
MONOS PCT: 9.2 % (ref 3.0–12.0)
Monocytes Absolute: 0.6 10*3/uL (ref 0.1–1.0)
Neutro Abs: 3.6 10*3/uL (ref 1.4–7.7)
Neutrophils Relative %: 56.1 % (ref 43.0–77.0)
Platelets: 205 10*3/uL (ref 150.0–400.0)
RBC: 5.26 Mil/uL — AB (ref 3.87–5.11)
RDW: 14.6 % (ref 11.5–15.5)
WBC: 6.4 10*3/uL (ref 4.0–10.5)

## 2015-01-23 LAB — COMPREHENSIVE METABOLIC PANEL
ALBUMIN: 4.3 g/dL (ref 3.5–5.2)
ALT: 49 U/L — ABNORMAL HIGH (ref 0–35)
AST: 40 U/L — ABNORMAL HIGH (ref 0–37)
Alkaline Phosphatase: 80 U/L (ref 39–117)
BILIRUBIN TOTAL: 0.6 mg/dL (ref 0.2–1.2)
BUN: 16 mg/dL (ref 6–23)
CALCIUM: 10 mg/dL (ref 8.4–10.5)
CO2: 28 mEq/L (ref 19–32)
Chloride: 106 mEq/L (ref 96–112)
Creatinine, Ser: 0.81 mg/dL (ref 0.40–1.20)
GFR: 74 mL/min (ref >60.00)
Glucose, Bld: 97 mg/dL (ref 70–99)
POTASSIUM: 5.3 meq/L — AB (ref 3.5–5.1)
Sodium: 141 mEq/L (ref 135–145)
TOTAL PROTEIN: 6.7 g/dL (ref 6.0–8.3)

## 2015-01-23 LAB — TSH: TSH: 1.55 u[IU]/mL (ref 0.35–4.50)

## 2015-01-23 MED ORDER — AMLODIPINE BESYLATE 10 MG PO TABS: 10.0000 mg | ORAL_TABLET | Freq: Every day | ORAL | Status: DC

## 2015-01-23 MED ORDER — TRAZODONE HCL 50 MG PO TABS: 50.0000 mg | ORAL_TABLET | Freq: Every day | ORAL | Status: DC

## 2015-01-23 MED ORDER — EZETIMIBE 10 MG PO TABS: 10.0000 mg | ORAL_TABLET | Freq: Every day | ORAL | Status: DC

## 2015-01-23 MED ORDER — HYDROCODONE-ACETAMINOPHEN 10-325 MG PO TABS: 1.0000 | ORAL_TABLET | Freq: Three times a day (TID) | ORAL | Status: DC | PRN

## 2015-01-23 MED ORDER — LEVOTHYROXINE SODIUM 75 MCG PO TABS: 75.0000 ug | ORAL_TABLET | Freq: Every day | ORAL | Status: DC

## 2015-01-23 MED ORDER — ESCITALOPRAM OXALATE 10 MG PO TABS: 10.0000 mg | ORAL_TABLET | Freq: Every day | ORAL | Status: DC

## 2015-01-23 MED ORDER — BENAZEPRIL HCL 20 MG PO TABS: 20.0000 mg | ORAL_TABLET | Freq: Every day | ORAL | Status: DC

## 2015-01-23 MED ORDER — METOPROLOL TARTRATE 50 MG PO TABS: 25.0000 mg | ORAL_TABLET | Freq: Two times a day (BID) | ORAL | Status: DC

## 2015-01-23 NOTE — Telephone Encounter (Signed)
Please call pt: - her labs looked stable with the exception of her potassium. Her potassium is mildly elevated at 5.3. I do not see where she is on a potassium supplement, if she is please have her hold for a week.  - She did have edema today on exam and could take an extra lasix tomorrow as well to help excrete the potassium. - She should avoid potassium rich foods for a few days.  - I will need to recheck her levels in 1 week. If she remains elevated we may need to try a different medication than her benazepril (which can cause potassium elevation). I have placed this order and she just needs to schedule a lab appt.  - I have called in refills for her thyroid medication as well.  - signs and sx of elevated potassium to watch for: fatigue or weakness. a feeling of numbness or tingling. nausea or vomiting. problems breathing. chest pain.  skipped heartbeats. If she experiences any of these she will need to be seen immediately.

## 2015-01-23 NOTE — Progress Notes (Signed)
Subjective:    Patient ID: Jennifer House, female    DOB: [birth year], 69 y.o.   MRN: [id number]  HPI Patient presents for new patient establishment. All past medical history, surgical history, allergies, family history, immunizations and social history was obtained from the patient today and entered into the electronic medical record. Records are requested from her prior PCP, and will be reviewed at the time they are received. All medical records will be updated at that time.  HTN/Hyperlipid/chronic coumadin/A.fib: Patient follows with Dr. Rayann Heman every 6 months, and Coumadin clinic. Patient reports compliance with amlodipine, Lotensin, Zetia, metoprolol. She states she takes Lasix Monday Wednesday and Friday. Last echo May 2016 with ejection fraction 60-65%. Mild LVH. Mild aortic regurg. Mild mitral valve regurg. Mildly dilated left and right atrium. Patient denies any chest pain, shortness of breath, orthopnea or weight gain. Patient does admit to lower extremity edema on occasions. Patient does not exercise regular. She does try to monitor the salt content in her diet.  Depression/insomnia: Patient reports she was on Prozac 20 mg and her prior PCP took her off the medication. She does not know why she was taken off the medication. She doesn't recall any negative side effects. Patient would like to be placed on something for her depression. She feels like she felt better when she was on the Prozac. Patient states she does have mild anxiety at times as well. She denies any mood disorders. Patient reports she does take her trazodone every evening, and this helps with being able to fall sleep. Otherwise patient states that she suffers from insomnia.  Hypothyroidism: Patient is uncertain when her last thyroid level was checked. She is in need of refills within the next week or so on her 75 g of Synthroid. She does report compliance with taking her medications daily on an empty stomach.  Health  maintenance:  Colonoscopy:No FHX, every 10 years, 2011. [facility] (dr. Cristina Gong) Mammogram:No Fhx, 06/13/2014 normal  Cervical cancer screening: Not indicated Immunizations: Flu indicated, other immunization records needed.  Infectious disease screening: HIV indicated Dexa: 2012, with h/o of osteopenia. Will await records from prior PCP, if greater then 3 years since last scan will need to obtain new bone density.   Past Medical History  Diagnosis Date  . Atrial fibrillation ([place])     longstanding persistent  . HTN (hypertension)   . HLD (hyperlipidemia)     mixed  . Hematuria     hx  . Renal calculus     hx  . Restless leg syndrome   . Osteoarthritis   . GERD (gastroesophageal reflux disease)   . Insomnia   . Anxiety   . Liver function test abnormality     hx  . Depression   . Obesity   . Hypothyroidism   . Carcinoma of bladder ([place])     transitional cell hx  . Osteopenia   . Headache(784.0)     hx of migraines  . Colon polyps    Allergies  Allergen Reactions  . Ibandronate Sodium Other (See Comments)    Bones hurt   Past Surgical History  Procedure Laterality Date  . Cystocopy  10/17/03    left stent placement, TURBT  . Total abdominal hysterectomy  1985  . Foot surgery    . Cardiac catheterization  1/12    revealed normal cors and LV function  . Colonoscopy    . Appendectomy    . Lumbar laminectomy/decompression microdiscectomy N/A 01/19/2013    Procedure:  LUMBAR THREE FOUR LUMBAR LAMINECTOMY/DECOMPRESSION MICRODISCECTOMY 1 LEVEL;  Surgeon: Faythe Ghee, MD;  Location: [facility];  Service: Neurosurgery;  Laterality: N/A;   Family History  Problem Relation Age of Onset  . Heart failure Mother   . Stroke Father   . Stroke Brother   . Heart attack Mother   . Hypertension Mother   . Hypertension Father    Social History   Social History  . Marital Status: Married    Spouse Name: N/A  . Number of Children: N/A  . Years of Education: N/A   Occupational  History  . Not on file.   Social History Main Topics  . Smoking status: Former Smoker    Quit date: 03/18/1985  . Smokeless tobacco: Never Used     Comment: denies   . Alcohol Use: No  . Drug Use: No  . Sexual Activity: Not on file   Other Topics Concern  . Not on file   Social History Narrative   Lives in [place], with husband.     Review of Systems Negative, with the exception of above mentioned in HPI     Objective:   Physical Exam BP 131/82 mmHg  Pulse 66  Temp(Src) 97.5 F (36.4 C) (Oral)  Resp 20  Ht 5' 4.75" (1.645 m)  Wt 191 lb (86.637 kg)  BMI 32.02 kg/m2  SpO2 97% Gen: Afebrile. No acute distress. Nontoxic in appearance, well-developed, well-nourished, Caucasian female.Pleasant. Mildly obese. HENT: AT. [place]. Bilateral TM visualized and normal in appearance. MMM. Bilateral nares mild erythema. Throat without erythema or exudates. Eyes:Pupils Equal Round Reactive to light, Extraocular movements intact,  Conjunctiva without redness, discharge or icterus. Neck/lymp/endocrine: Supple, no lymphadenopathy, mild thyromegaly CV: RRR no murmurs appreciated, +1 edema, +2/4 P posterior tibialis pulses Chest: CTAB, no wheeze or crackles Abd: Soft. Obese. NTND. BS present.  Skin: No rashes, purpura or petechiae. Small 2 mm stuck on plaque like lesion left bicep. Neuro: Normal gait. PERLA. EOMi. Alert. Oriented x3  Psych: Normal affect, dress and demeanor. Normal speech. Normal thought content and judgment..     Assessment & Plan:  1. Need for prophylactic vaccination and inoculation against influenza - Flu Vaccine QUAD 36+ mos PF IM (Fluarix & Fluzone Quad PF)  2. INSOMNIA - Refills on trazodone provided today  3. Hypothyroidism due to acquired atrophy of thyroid - TSH - We'll provide refills on Synthroid once TSH levels have returned. If dose needs adjusted we'll do so at the time.  4. Essential hypertension - Patient is to continue following with cardiology  every 6 months. - Refills provided on metoprolol, Norvasc and Lotensin and Zetia.  - Records need to be obtained about last lipid panel was collected, patient is not fasting today. If it has been over a year will provide order for lipid panel. - Patient encouraged to exercise at least 3 times a week, eats a low-salt diet. - CBC w/Diff - Comp Met (CMET)  5. Depression - Patient was taken off Prozac but prior PCP, but feel she does need something for her depression. Discussed starting Lexapro, with follow-up in 4 weeks. - escitalopram (LEXAPRO) 10 MG tablet; Take 1 tablet (10 mg total) by mouth daily.  Dispense: 30 tablet; Refill: 0  6. Health care maintenance Health maintenance:  Colonoscopy:No [place], every 10 years, 2011. [facility] (Dr. Cristina Gong). Need to clarify date once records received.  Mammogram:No Fhx, UTD 06/13/2014 normal. Cervical cancer screening: Not indicated, hysterectomy Immunizations: Flu given today, Unknown other immunizations.  Records requested.  Infectious disease screening: HIV indicated Dexa: 2012, with h/o of osteopenia. Will await records from prior PCP, if greater then 3 years since last scan will need to obtain new bone density.    7. Chronic atrial fibrillation ([place]) - Patient to continue to follow with Coumadin clinic and cardiology.  8. Seborrheic keratosis - Left bicep area small seborrheic keratosis. Patient encouraged to use exfoliation, and monitor area. If becomes irritated offer cryotherapy.   Follow-up 4 weeks depression

## 2015-01-23 NOTE — Patient Instructions (Signed)
Health Maintenance, Female Adopting a healthy lifestyle and getting preventive care can go a long way to promote health and wellness. Talk with your health care provider about what schedule of regular examinations is right for you. This is a good chance for you to check in with your provider about disease prevention and staying healthy. In between checkups, there are plenty of things you can do on your own. Experts have done a lot of research about which lifestyle changes and preventive measures are most likely to keep you healthy. Ask your health care provider for more information. WEIGHT AND DIET  Eat a healthy diet  Be sure to include plenty of vegetables, fruits, low-fat dairy products, and lean protein.  Do not eat a lot of foods high in solid fats, added sugars, or salt.  Get regular exercise. This is one of the most important things you can do for your health.  Most adults should exercise for at least 150 minutes each week. The exercise should increase your heart rate and make you sweat (moderate-intensity exercise).  Most adults should also do strengthening exercises at least twice a week. This is in addition to the moderate-intensity exercise.  Maintain a healthy weight  Body mass index (BMI) is a measurement that can be used to identify possible weight problems. It estimates body fat based on height and weight. Your health care provider can help determine your BMI and help you achieve or maintain a healthy weight.  For females 20 years of age and older:   A BMI below 18.5 is considered underweight.  A BMI of 18.5 to 24.9 is normal.  A BMI of 25 to 29.9 is considered overweight.  A BMI of 30 and above is considered obese.  Watch levels of cholesterol and blood lipids  You should start having your blood tested for lipids and cholesterol at 71 years of age, then have this test every 5 years.  You may need to have your cholesterol levels checked more often if:  Your lipid  or cholesterol levels are high.  You are older than 71 years of age.  You are at high risk for heart disease.  CANCER SCREENING   Lung Cancer  Lung cancer screening is recommended for adults 55-80 years old who are at high risk for lung cancer because of a history of smoking.  A yearly low-dose CT scan of the lungs is recommended for people who:  Currently smoke.  Have quit within the past 15 years.  Have at least a 30-pack-year history of smoking. A pack year is smoking an average of one pack of cigarettes a day for 1 year.  Yearly screening should continue until it has been 15 years since you quit.  Yearly screening should stop if you develop a health problem that would prevent you from having lung cancer treatment.  Breast Cancer  Practice breast self-awareness. This means understanding how your breasts normally appear and feel.  It also means doing regular breast self-exams. Let your health care provider know about any changes, no matter how small.  If you are in your 20s or 30s, you should have a clinical breast exam (CBE) by a health care provider every 1-3 years as part of a regular health exam.  If you are 40 or older, have a CBE every year. Also consider having a breast X-ray (mammogram) every year.  If you have a family history of breast cancer, talk to your health care provider about genetic screening.  If you   are at high risk for breast cancer, talk to your health care provider about having an MRI and a mammogram every year.  Breast cancer gene (BRCA) assessment is recommended for women who have family members with BRCA-related cancers. BRCA-related cancers include:  Breast.  Ovarian.  Tubal.  Peritoneal cancers.  Results of the assessment will determine the need for genetic counseling and BRCA1 and BRCA2 testing. Cervical Cancer Your health care provider may recommend that you be screened regularly for cancer of the pelvic organs (ovaries, uterus, and  vagina). This screening involves a pelvic examination, including checking for microscopic changes to the surface of your cervix (Pap test). You may be encouraged to have this screening done every 3 years, beginning at age 21.  For women ages 30-65, health care providers may recommend pelvic exams and Pap testing every 3 years, or they may recommend the Pap and pelvic exam, combined with testing for human papilloma virus (HPV), every 5 years. Some types of HPV increase your risk of cervical cancer. Testing for HPV may also be done on women of any age with unclear Pap test results.  Other health care providers may not recommend any screening for nonpregnant women who are considered low risk for pelvic cancer and who do not have symptoms. Ask your health care provider if a screening pelvic exam is right for you.  If you have had past treatment for cervical cancer or a condition that could lead to cancer, you need Pap tests and screening for cancer for at least 20 years after your treatment. If Pap tests have been discontinued, your risk factors (such as having a new sexual partner) need to be reassessed to determine if screening should resume. Some women have medical problems that increase the chance of getting cervical cancer. In these cases, your health care provider may recommend more frequent screening and Pap tests. Colorectal Cancer  This type of cancer can be detected and often prevented.  Routine colorectal cancer screening usually begins at 71 years of age and continues through 71 years of age.  Your health care provider may recommend screening at an earlier age if you have risk factors for colon cancer.  Your health care provider may also recommend using home test kits to check for hidden blood in the stool.  A small camera at the end of a tube can be used to examine your colon directly (sigmoidoscopy or colonoscopy). This is done to check for the earliest forms of colorectal  cancer.  Routine screening usually begins at age 50.  Direct examination of the colon should be repeated every 5-10 years through 71 years of age. However, you may need to be screened more often if early forms of precancerous polyps or small growths are found. Skin Cancer  Check your skin from head to toe regularly.  Tell your health care provider about any new moles or changes in moles, especially if there is a change in a mole's shape or color.  Also tell your health care provider if you have a mole that is larger than the size of a pencil eraser.  Always use sunscreen. Apply sunscreen liberally and repeatedly throughout the day.  Protect yourself by wearing long sleeves, pants, a wide-brimmed hat, and sunglasses whenever you are outside. HEART DISEASE, DIABETES, AND HIGH BLOOD PRESSURE   High blood pressure causes heart disease and increases the risk of stroke. High blood pressure is more likely to develop in:  People who have blood pressure in the high end   of the normal range (130-139/85-89 mm Hg).  People who are overweight or obese.  People who are African American.  If you are 38-23 years of age, have your blood pressure checked every 3-5 years. If you are 61 years of age or older, have your blood pressure checked every year. You should have your blood pressure measured twice--once when you are at a hospital or clinic, and once when you are not at a hospital or clinic. Record the average of the two measurements. To check your blood pressure when you are not at a hospital or clinic, you can use:  An automated blood pressure machine at a pharmacy.  A home blood pressure monitor.  If you are between 45 years and 39 years old, ask your health care provider if you should take aspirin to prevent strokes.  Have regular diabetes screenings. This involves taking a blood sample to check your fasting blood sugar level.  If you are at a normal weight and have a low risk for diabetes,  have this test once every three years after 71 years of age.  If you are overweight and have a high risk for diabetes, consider being tested at a younger age or more often. PREVENTING INFECTION  Hepatitis B  If you have a higher risk for hepatitis B, you should be screened for this virus. You are considered at high risk for hepatitis B if:  You were born in a country where hepatitis B is common. Ask your health care provider which countries are considered high risk.  Your parents were born in a high-risk country, and you have not been immunized against hepatitis B (hepatitis B vaccine).  You have HIV or AIDS.  You use needles to inject street drugs.  You live with someone who has hepatitis B.  You have had sex with someone who has hepatitis B.  You get hemodialysis treatment.  You take certain medicines for conditions, including cancer, organ transplantation, and autoimmune conditions. Hepatitis C  Blood testing is recommended for:  Everyone born from [birth year] through 1967.  Anyone with known risk factors for hepatitis C. Sexually transmitted infections (STIs)  You should be screened for sexually transmitted infections (STIs) including gonorrhea and chlamydia if:  You are sexually active and are younger than 71 years of age.  You are older than 71 years of age and your health care provider tells you that you are at risk for this type of infection.  Your sexual activity has changed since you were last screened and you are at an increased risk for chlamydia or gonorrhea. Ask your health care provider if you are at risk.  If you do not have HIV, but are at risk, it may be recommended that you take a prescription medicine daily to prevent HIV infection. This is called pre-exposure prophylaxis (PrEP). You are considered at risk if:  You are sexually active and do not regularly use condoms or know the HIV status of your partner(s).  You take drugs by injection.  You are sexually  active with a partner who has HIV. Talk with your health care provider about whether you are at high risk of being infected with HIV. If you choose to begin PrEP, you should first be tested for HIV. You should then be tested every 3 months for as long as you are taking PrEP.  PREGNANCY   If you are premenopausal and you may become pregnant, ask your health care provider about preconception counseling.  If you may  become pregnant, take 400 to 800 micrograms (mcg) of folic acid every day.  If you want to prevent pregnancy, talk to your health care provider about birth control (contraception). OSTEOPOROSIS AND MENOPAUSE   Osteoporosis is a disease in which the bones lose minerals and strength with aging. This can result in serious bone fractures. Your risk for osteoporosis can be identified using a bone density scan.  If you are 63 years of age or older, or if you are at risk for osteoporosis and fractures, ask your health care provider if you should be screened.  Ask your health care provider whether you should take a calcium or vitamin D supplement to lower your risk for osteoporosis.  Menopause may have certain physical symptoms and risks.  Hormone replacement therapy may reduce some of these symptoms and risks. Talk to your health care provider about whether hormone replacement therapy is right for you.  HOME CARE INSTRUCTIONS   Schedule regular health, dental, and eye exams.  Stay current with your immunizations.   Do not use any tobacco products including cigarettes, chewing tobacco, or electronic cigarettes.  If you are pregnant, do not drink alcohol.  If you are breastfeeding, limit how much and how often you drink alcohol.  Limit alcohol intake to no more than 1 drink per day for nonpregnant women. One drink equals 12 ounces of beer, 5 ounces of wine, or 1 ounces of hard liquor.  Do not use street drugs.  Do not share needles.  Ask your health care provider for help if  you need support or information about quitting drugs.  Tell your health care provider if you often feel depressed.  Tell your health care provider if you have ever been abused or do not feel safe at home.   This information is not intended to replace advice given to you by your health care provider. Make sure you discuss any questions you have with your health care provider.   Document Released: 09/17/2010 Document Revised: 03/25/2014 Document Reviewed: 02/03/2013 Elsevier Interactive Patient Education [employer].  I will call you with results once they are received  We will call in tyroid med once results are received.  F/U 4 weeks for depression then every 6 month son chronic issues

## 2015-01-24 NOTE — Telephone Encounter (Signed)
Spoke with patient reviewed lab results and all instructions . Patient verbalized understanding of all instructions. Scheduled patient for lab appt .

## 2015-01-25 ENCOUNTER — Encounter: Payer: Self-pay | Admitting: Family Medicine

## 2015-01-27 ENCOUNTER — Encounter: Payer: Self-pay | Admitting: Family Medicine

## 2015-02-01 ENCOUNTER — Telehealth: Payer: Self-pay | Admitting: Family Medicine

## 2015-02-01 ENCOUNTER — Other Ambulatory Visit (INDEPENDENT_AMBULATORY_CARE_PROVIDER_SITE_OTHER): Payer: Medicare Other

## 2015-02-01 DIAGNOSIS — E875 Hyperkalemia: Secondary | ICD-10-CM | POA: Diagnosis not present

## 2015-02-01 LAB — POTASSIUM: POTASSIUM: 4.1 meq/L (ref 3.5–5.1)

## 2015-02-01 NOTE — Telephone Encounter (Signed)
Please call patient, potassium is normal.

## 2015-02-02 NOTE — Telephone Encounter (Signed)
Reviewed lab results with patient. All medication refills were sent to patient pharmacy on 01/23/15.

## 2015-02-22 ENCOUNTER — Encounter: Payer: Self-pay | Admitting: Family Medicine

## 2015-02-22 ENCOUNTER — Ambulatory Visit (INDEPENDENT_AMBULATORY_CARE_PROVIDER_SITE_OTHER): Payer: Medicare Other | Admitting: Family Medicine

## 2015-02-22 VITALS — BP 119/67 | HR 60 | Temp 97.6°F | Resp 20 | Wt 204.2 lb

## 2015-02-22 DIAGNOSIS — Z299 Encounter for prophylactic measures, unspecified: Secondary | ICD-10-CM | POA: Insufficient documentation

## 2015-02-22 DIAGNOSIS — M8580 Other specified disorders of bone density and structure, unspecified site: Secondary | ICD-10-CM

## 2015-02-22 DIAGNOSIS — Z23 Encounter for immunization: Secondary | ICD-10-CM | POA: Diagnosis not present

## 2015-02-22 DIAGNOSIS — F329 Major depressive disorder, single episode, unspecified: Secondary | ICD-10-CM

## 2015-02-22 DIAGNOSIS — F32A Depression, unspecified: Secondary | ICD-10-CM

## 2015-02-22 MED ORDER — ESCITALOPRAM OXALATE 10 MG PO TABS: 10.0000 mg | ORAL_TABLET | Freq: Every day | ORAL | Status: DC

## 2015-02-22 NOTE — Patient Instructions (Signed)
Hope you have a wonderful holiday and new year. We updated your tetanus vaccination today. You will be Due for your last pneumonia shot in June. We will complete medicare wellness then.  Follow up on depression in March.

## 2015-02-22 NOTE — Progress Notes (Signed)
   Subjective:    Patient ID: Jennifer House, female    DOB: [birth year], 69 y.o.   MRN: [id number]  HPI  Depression: Patient presents for follow-up on her depression. On her last appointment naproxen 4 weeks ago patient was started on Lexapro 10 mg. She had been on Prozac in the past, but this has been discontinued by her prior PCP. Patient states that she felt like she needed something for depression and therefore Lexapro 10 mg was started on her last office visit. Patient states she feels much better now that she is on the Lexapro. She states that it has completely resolves her anxiety and depression. She has no negative side effects to report. He would like to continue medication at current dose.  Former Smoker   Past Medical History  Diagnosis Date  . Atrial fibrillation ([place])     longstanding persistent  . HTN (hypertension)   . HLD (hyperlipidemia)     mixed  . Hematuria     hx  . Renal calculus     hx  . Restless leg syndrome   . Osteoarthritis     low back pain with left sciatica  . GERD (gastroesophageal reflux disease)   . Insomnia   . Anxiety   . Liver function test abnormality     hx fatty liver  . Depression   . Obesity   . Hypothyroidism   . 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 Risa Grill  . Osteopenia     dexa 2013  . Headache(784.0)     hx of migraines  . Colon polyps   . Nonalcoholic fatty liver disease   . Murmur, cardiac    Allergies  Allergen Reactions  . Ibandronate Sodium Other (See Comments)    Bones hurt  . Pravachol [Pravastatin Sodium] Other (See Comments)    Myalgias   . Ultram [Tramadol Hcl]     nausea    Review of Systems Negative, with the exception of above mentioned in HPI      Objective:   Physical Exam BP 119/67 mmHg  Pulse 60  Temp(Src) 97.6 F (36.4 C) (Oral)  Resp 20  Wt 204 lb 4 oz (92.647 kg)  SpO2 95% Gen: Afebrile. No acute distress. Nontoxic in appearance, well-developed, well-nourished, very  pleasant female. HENT: AT. [name]. MMM.  Eyes:Pupils Equal Round Reactive to light, Extraocular movements intact,  Conjunctiva without redness, discharge or icterus. CV: RRR  Psych: Normal affect, dress and demeanor. Normal speech. Normal thought content and judgment..      Assessment & Plan:  Depression - She is doing well, stable on Lexapro 10 mg. Refills provided today. - escitalopram (LEXAPRO) 10 MG tablet; Take 1 tablet (10 mg total) by mouth daily.  Dispense: 30 tablet; Refill: 2 - Follow-up 3 months  Preventive measure Osteopenia - DG Bone Density; Future Need for prophylactic vaccination with combined diphtheria-tetanus-pertussis (DTP) vaccine - Tdap vaccine greater than or equal to 7yo IM  Follow-up in March for depression and  June for Medicare wellness

## 2015-03-03 ENCOUNTER — Ambulatory Visit (INDEPENDENT_AMBULATORY_CARE_PROVIDER_SITE_OTHER): Payer: Medicare Other | Admitting: *Deleted

## 2015-03-03 DIAGNOSIS — I482 Chronic atrial fibrillation, unspecified: Secondary | ICD-10-CM

## 2015-03-03 DIAGNOSIS — Z7901 Long term (current) use of anticoagulants: Secondary | ICD-10-CM | POA: Diagnosis not present

## 2015-03-03 DIAGNOSIS — I4891 Unspecified atrial fibrillation: Secondary | ICD-10-CM

## 2015-03-03 LAB — POCT INR: INR: 2.4

## 2015-04-10 ENCOUNTER — Other Ambulatory Visit: Payer: Self-pay | Admitting: *Deleted

## 2015-04-10 MED ORDER — TRAZODONE HCL 50 MG PO TABS: 50.0000 mg | ORAL_TABLET | Freq: Every day | ORAL | Status: DC

## 2015-04-10 NOTE — Telephone Encounter (Signed)
Refill request received for trazodone last filled 01/23/15 for 60 tabs with 2 refills. Patient last OV states next follow up in March 2017.

## 2015-04-14 ENCOUNTER — Ambulatory Visit (INDEPENDENT_AMBULATORY_CARE_PROVIDER_SITE_OTHER): Payer: Medicare Other | Admitting: *Deleted

## 2015-04-14 DIAGNOSIS — I482 Chronic atrial fibrillation, unspecified: Secondary | ICD-10-CM

## 2015-04-14 DIAGNOSIS — I4891 Unspecified atrial fibrillation: Secondary | ICD-10-CM

## 2015-04-14 DIAGNOSIS — Z7901 Long term (current) use of anticoagulants: Secondary | ICD-10-CM | POA: Diagnosis not present

## 2015-04-14 LAB — POCT INR: INR: 2.5

## 2015-05-10 ENCOUNTER — Other Ambulatory Visit: Payer: Self-pay | Admitting: Internal Medicine

## 2015-05-23 ENCOUNTER — Encounter: Payer: Self-pay | Admitting: Family Medicine

## 2015-05-23 ENCOUNTER — Ambulatory Visit (INDEPENDENT_AMBULATORY_CARE_PROVIDER_SITE_OTHER): Payer: Medicare Other | Admitting: Family Medicine

## 2015-05-23 ENCOUNTER — Other Ambulatory Visit: Payer: Self-pay | Admitting: Family Medicine

## 2015-05-23 VITALS — BP 121/75 | HR 78 | Temp 98.0°F | Resp 20 | Wt 198.0 lb

## 2015-05-23 DIAGNOSIS — M1990 Unspecified osteoarthritis, unspecified site: Secondary | ICD-10-CM

## 2015-05-23 DIAGNOSIS — F329 Major depressive disorder, single episode, unspecified: Secondary | ICD-10-CM

## 2015-05-23 DIAGNOSIS — F32A Depression, unspecified: Secondary | ICD-10-CM

## 2015-05-23 MED ORDER — HYDROCODONE-ACETAMINOPHEN 10-325 MG PO TABS: 1.0000 | ORAL_TABLET | Freq: Three times a day (TID) | ORAL | Status: DC | PRN

## 2015-05-23 MED ORDER — ESCITALOPRAM OXALATE 10 MG PO TABS
10.0000 mg | ORAL_TABLET | Freq: Every day | ORAL | Status: DC
Start: 2015-05-23 — End: 2015-12-15

## 2015-05-23 MED ORDER — NITROGLYCERIN 0.4 MG SL SUBL: 0.4000 mg | SUBLINGUAL_TABLET | SUBLINGUAL | Status: DC | PRN

## 2015-05-23 NOTE — Progress Notes (Signed)
Patient ID: Jennifer House, female   DOB: 08-24-1945, 70 y.o.   MRN: [id number]   Subjective:    Patient ID: Jennifer House, female    DOB: Feb 23, 1946, 70 y.o.   MRN: [id number]  Depression        Depression: Patient presents for follow-up on her depression. On her last appointment  4 weeks ago patient was started on Lexapro 10 mg. She had been on Prozac in the past, but this has been discontinued by her prior PCP.  Patient states she feels much better now that she is on the Lexapro. She states that it has completely resolves her anxiety and depression. She has no negative side effects to report. He would like to continue medication at current dose.   Arthritis: Patient has history of arthritis in her lower back for greater than 20 years. She infrequently needs narcotic medication to help her with this pain. She states that she was moving, and never did it last month and needed to take her medications more than usual. She is asking for a refill today.  Past Medical History  Diagnosis Date  . Atrial fibrillation ([place])     longstanding persistent  . HTN (hypertension)   . HLD (hyperlipidemia)     mixed  . Hematuria     hx  . Renal calculus     hx  . Restless leg syndrome   . Osteoarthritis     low back pain with left sciatica  . GERD (gastroesophageal reflux disease)   . Insomnia   . Anxiety   . Liver function test abnormality     hx fatty liver  . Depression   . Obesity   . Hypothyroidism   . 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 Risa Grill  . Osteopenia     dexa 2013  . Headache(784.0)     hx of migraines  . Colon polyps   . Nonalcoholic fatty liver disease   . Murmur, cardiac    Allergies  Allergen Reactions  . Ibandronate Sodium Other (See Comments)    Bones hurt  . Pravachol [Pravastatin Sodium] Other (See Comments)    Myalgias   . Ultram [Tramadol Hcl]     nausea    Review of Systems  Psychiatric/Behavioral: Positive for depression.    Negative, with the exception of above mentioned in HPI      Objective:   Physical Exam BP 121/75 mmHg  Pulse 78  Temp(Src) 98 F (36.7 C)  Resp 20  Wt 198 lb (89.812 kg)  SpO2 95% Gen: Afebrile. No acute distress. Nontoxic in appearance, well-developed, well-nourished, very pleasant female. HENT: AT. [place]. MMM.  Eyes:Pupils Equal Round Reactive to light, Extraocular movements intact,  Conjunctiva without redness, discharge or icterus. CV: RRR  Psych: Normal affect, dress and demeanor. Normal speech. Normal thought content and judgment.     Assessment & Plan:  1. Depression - stable. - Continue escitalopram (LEXAPRO) 10 MG tablet; Take 1 tablet (10 mg total) by mouth daily.  Dispense: 90 tablet; Refill: 1  2. Arthritis - refill on narcotic today, patient was advised that this to be used sparingly only for severe pain. Agreed to continue scripts as long as not needing more than 3 times year.  Medicare wellness in June  > 25 minutes spent with patient, >50% of time spent face to face counseling patient and coordinating care.

## 2015-05-23 NOTE — Patient Instructions (Signed)
Medicare wellness in June It was a pleasure seeing you today.

## 2015-05-26 ENCOUNTER — Ambulatory Visit (INDEPENDENT_AMBULATORY_CARE_PROVIDER_SITE_OTHER): Payer: Medicare Other | Admitting: Pharmacist

## 2015-05-26 DIAGNOSIS — I482 Chronic atrial fibrillation, unspecified: Secondary | ICD-10-CM

## 2015-05-26 DIAGNOSIS — I4891 Unspecified atrial fibrillation: Secondary | ICD-10-CM

## 2015-05-26 DIAGNOSIS — Z7901 Long term (current) use of anticoagulants: Secondary | ICD-10-CM

## 2015-05-26 LAB — POCT INR: INR: 2.2

## 2015-07-07 ENCOUNTER — Ambulatory Visit (INDEPENDENT_AMBULATORY_CARE_PROVIDER_SITE_OTHER): Payer: Medicare Other

## 2015-07-07 DIAGNOSIS — I4891 Unspecified atrial fibrillation: Secondary | ICD-10-CM | POA: Diagnosis not present

## 2015-07-07 DIAGNOSIS — I482 Chronic atrial fibrillation, unspecified: Secondary | ICD-10-CM

## 2015-07-07 DIAGNOSIS — Z7901 Long term (current) use of anticoagulants: Secondary | ICD-10-CM | POA: Diagnosis not present

## 2015-07-07 LAB — POCT INR: INR: 2.2

## 2015-08-18 ENCOUNTER — Ambulatory Visit (INDEPENDENT_AMBULATORY_CARE_PROVIDER_SITE_OTHER): Payer: Medicare Other | Admitting: *Deleted

## 2015-08-18 DIAGNOSIS — I482 Chronic atrial fibrillation, unspecified: Secondary | ICD-10-CM

## 2015-08-18 DIAGNOSIS — I4891 Unspecified atrial fibrillation: Secondary | ICD-10-CM | POA: Diagnosis not present

## 2015-08-18 DIAGNOSIS — Z7901 Long term (current) use of anticoagulants: Secondary | ICD-10-CM

## 2015-08-18 LAB — POCT INR: INR: 2.3

## 2015-08-23 DIAGNOSIS — H35363 Drusen (degenerative) of macula, bilateral: Secondary | ICD-10-CM | POA: Diagnosis not present

## 2015-08-23 DIAGNOSIS — H5203 Hypermetropia, bilateral: Secondary | ICD-10-CM | POA: Diagnosis not present

## 2015-08-23 DIAGNOSIS — H2513 Age-related nuclear cataract, bilateral: Secondary | ICD-10-CM | POA: Diagnosis not present

## 2015-08-25 ENCOUNTER — Ambulatory Visit (INDEPENDENT_AMBULATORY_CARE_PROVIDER_SITE_OTHER): Payer: Medicare Other | Admitting: Family Medicine

## 2015-08-25 ENCOUNTER — Telehealth: Payer: Self-pay | Admitting: Family Medicine

## 2015-08-25 ENCOUNTER — Encounter: Payer: Self-pay | Admitting: Family Medicine

## 2015-08-25 VITALS — BP 121/75 | HR 56 | Temp 97.9°F | Resp 20 | Ht 65.0 in | Wt 190.5 lb

## 2015-08-25 DIAGNOSIS — Z130 Encounter for screening for diseases of the blood and blood-forming organs and certain disorders involving the immune mechanism: Secondary | ICD-10-CM

## 2015-08-25 DIAGNOSIS — E781 Pure hyperglyceridemia: Secondary | ICD-10-CM

## 2015-08-25 DIAGNOSIS — Z1239 Encounter for other screening for malignant neoplasm of breast: Secondary | ICD-10-CM

## 2015-08-25 DIAGNOSIS — E785 Hyperlipidemia, unspecified: Secondary | ICD-10-CM

## 2015-08-25 DIAGNOSIS — Z23 Encounter for immunization: Secondary | ICD-10-CM | POA: Diagnosis not present

## 2015-08-25 DIAGNOSIS — E559 Vitamin D deficiency, unspecified: Secondary | ICD-10-CM | POA: Insufficient documentation

## 2015-08-25 DIAGNOSIS — Z79899 Other long term (current) drug therapy: Secondary | ICD-10-CM | POA: Insufficient documentation

## 2015-08-25 DIAGNOSIS — M8580 Other specified disorders of bone density and structure, unspecified site: Secondary | ICD-10-CM | POA: Diagnosis not present

## 2015-08-25 DIAGNOSIS — Z1231 Encounter for screening mammogram for malignant neoplasm of breast: Secondary | ICD-10-CM

## 2015-08-25 DIAGNOSIS — Z0000 Encounter for general adult medical examination without abnormal findings: Secondary | ICD-10-CM

## 2015-08-25 DIAGNOSIS — I482 Chronic atrial fibrillation, unspecified: Secondary | ICD-10-CM

## 2015-08-25 DIAGNOSIS — Z7901 Long term (current) use of anticoagulants: Secondary | ICD-10-CM

## 2015-08-25 DIAGNOSIS — Z1159 Encounter for screening for other viral diseases: Secondary | ICD-10-CM | POA: Insufficient documentation

## 2015-08-25 DIAGNOSIS — Z8551 Personal history of malignant neoplasm of bladder: Secondary | ICD-10-CM | POA: Insufficient documentation

## 2015-08-25 DIAGNOSIS — E2839 Other primary ovarian failure: Secondary | ICD-10-CM

## 2015-08-25 HISTORY — DX: Encounter for general adult medical examination without abnormal findings: Z00.00

## 2015-08-25 LAB — CBC WITH DIFFERENTIAL/PLATELET
BASOS ABS: 0 10*3/uL (ref 0.0–0.1)
BASOS PCT: 0.6 % (ref 0.0–3.0)
Eosinophils Absolute: 0.1 10*3/uL (ref 0.0–0.7)
Eosinophils Relative: 1.7 % (ref 0.0–5.0)
HCT: 43.1 % (ref 36.0–46.0)
HEMOGLOBIN: 14 g/dL (ref 12.0–15.0)
LYMPHS ABS: 1.9 10*3/uL (ref 0.7–4.0)
Lymphocytes Relative: 28.1 % (ref 12.0–46.0)
MCHC: 32.5 g/dL (ref 30.0–36.0)
MCV: 81.5 fl (ref 78.0–100.0)
MONO ABS: 0.4 10*3/uL (ref 0.1–1.0)
Monocytes Relative: 6.5 % (ref 3.0–12.0)
NEUTROS ABS: 4.3 10*3/uL (ref 1.4–7.7)
Neutrophils Relative %: 63.1 % (ref 43.0–77.0)
Platelets: 195 10*3/uL (ref 150.0–400.0)
RBC: 5.29 Mil/uL — AB (ref 3.87–5.11)
RDW: 14.2 % (ref 11.5–15.5)
WBC: 6.8 10*3/uL (ref 4.0–10.5)

## 2015-08-25 LAB — BASIC METABOLIC PANEL
BUN: 17 mg/dL (ref 6–23)
CALCIUM: 9.5 mg/dL (ref 8.4–10.5)
CHLORIDE: 105 meq/L (ref 96–112)
CO2: 29 meq/L (ref 19–32)
Creatinine, Ser: 0.78 mg/dL (ref 0.40–1.20)
GFR: 77.16 mL/min (ref >60.00)
GLUCOSE: 97 mg/dL (ref 70–99)
Potassium: 4.3 mEq/L (ref 3.5–5.1)
SODIUM: 140 meq/L (ref 135–145)

## 2015-08-25 LAB — LIPID PANEL
CHOL/HDL RATIO: 4
Cholesterol: 207 mg/dL — ABNORMAL HIGH (ref 0–200)
HDL: 50.7 mg/dL (ref >39.00)
LDL CALC: 131 mg/dL — AB (ref 0–99)
NONHDL: 156.09
Triglycerides: 124 mg/dL (ref 0.0–149.0)
VLDL: 24.8 mg/dL (ref 0.0–40.0)

## 2015-08-25 LAB — VITAMIN D 25 HYDROXY (VIT D DEFICIENCY, FRACTURES): VITD: 40.83 ng/mL (ref 30.00–100.00)

## 2015-08-25 NOTE — Telephone Encounter (Addendum)
Please call pt: - labs look good, cholesterol is very mildly elevated from last year. Would have her try to increase exercise and follow a tighter diet.  - Vit D looks great, continue current dose.  - exercise > 150 minutes a week.  - Diet low in saturated fats, sugars. More fresh vegetables and lean meats.
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
## 2015-08-25 NOTE — Patient Instructions (Signed)
  Jennifer House , Thank you for taking time to come for your Medicare Wellness Visit. I appreciate your ongoing commitment to your health goals. Please review the following plan we discussed and let me know if I can assist you in the future.   These are the goals we discussed: Goals    None      This is a list of the screening recommended for you and due dates:  Health Maintenance  Topic Date Due  .  Hepatitis C: One time screening is recommended by Center for Disease Control  (CDC) for  adults born from [birth year] through 1967.   12/03/1945  . Pneumonia vaccines (2 of 2 - PPSV23) 08/18/2015  . Flu Shot  10/17/2015  . Mammogram  06/12/2016  . Colon Cancer Screening  03/18/2018  . Tetanus Vaccine  02/21/2025  . DEXA scan (bone density measurement)  Completed  . Shingles Vaccine  Completed    Hep c screen completed today. Ordered bone density and mammogram. Yearly flu Complete advance directives and have notarized.

## 2015-08-26 LAB — HEPATITIS C ANTIBODY: HCV Ab: NEGATIVE

## 2015-08-28 NOTE — Telephone Encounter (Signed)
Spoke with patient reviewed lab results and instructions. Patient verbalized understanding. 

## 2015-09-25 ENCOUNTER — Other Ambulatory Visit: Payer: Self-pay | Admitting: Family Medicine

## 2015-09-29 ENCOUNTER — Ambulatory Visit (INDEPENDENT_AMBULATORY_CARE_PROVIDER_SITE_OTHER): Payer: Medicare Other | Admitting: *Deleted

## 2015-09-29 DIAGNOSIS — I4891 Unspecified atrial fibrillation: Secondary | ICD-10-CM | POA: Diagnosis not present

## 2015-09-29 DIAGNOSIS — I482 Chronic atrial fibrillation, unspecified: Secondary | ICD-10-CM

## 2015-09-29 DIAGNOSIS — Z7901 Long term (current) use of anticoagulants: Secondary | ICD-10-CM | POA: Diagnosis not present

## 2015-09-29 LAB — POCT INR: INR: 2.1

## 2015-10-05 ENCOUNTER — Other Ambulatory Visit: Payer: Self-pay | Admitting: Internal Medicine

## 2015-10-17 ENCOUNTER — Other Ambulatory Visit: Payer: Self-pay | Admitting: Physician Assistant

## 2015-10-17 ENCOUNTER — Other Ambulatory Visit: Payer: Self-pay | Admitting: *Deleted

## 2015-10-17 DIAGNOSIS — R0602 Shortness of breath: Secondary | ICD-10-CM

## 2015-10-17 DIAGNOSIS — I10 Essential (primary) hypertension: Secondary | ICD-10-CM

## 2015-10-17 MED ORDER — EZETIMIBE 10 MG PO TABS: 10.0000 mg | ORAL_TABLET | Freq: Every day | ORAL | 5 refills | Status: DC

## 2015-10-17 MED ORDER — AMLODIPINE BESYLATE 10 MG PO TABS
10.0000 mg | ORAL_TABLET | Freq: Every day | ORAL | 5 refills | Status: DC
Start: 2015-10-17 — End: 2016-03-21

## 2015-10-17 NOTE — Telephone Encounter (Signed)
Amlodipine and zetia refilled

## 2015-10-18 NOTE — Telephone Encounter (Signed)
Approved    Disp Refills Start End  furosemide (LASIX) 20 MG tablet 30 tablet 0 10/17/2015   Sig:  TAKE 1 TABLET BY MOUTH EVERY MONDAY,WEDNESDAY AND FRIDAY  Class:  Normal  DAW:  No  Comment:  Please call our office to schedule an yearly appointment before anymore refills. [phone]. Thank you 1st attempt  Authorizing Provider:  Liliane Shi, PA-C  Ordering User:  Derl Barrow

## 2015-11-10 ENCOUNTER — Ambulatory Visit (INDEPENDENT_AMBULATORY_CARE_PROVIDER_SITE_OTHER): Payer: Medicare Other | Admitting: Pharmacist

## 2015-11-10 DIAGNOSIS — Z7901 Long term (current) use of anticoagulants: Secondary | ICD-10-CM

## 2015-11-10 DIAGNOSIS — I4891 Unspecified atrial fibrillation: Secondary | ICD-10-CM | POA: Diagnosis not present

## 2015-11-10 LAB — POCT INR: INR: 2.1

## 2015-11-24 ENCOUNTER — Other Ambulatory Visit: Payer: Self-pay | Admitting: *Deleted

## 2015-11-24 MED ORDER — BENAZEPRIL HCL 20 MG PO TABS: 20.0000 mg | ORAL_TABLET | Freq: Every day | ORAL | 2 refills | Status: DC

## 2015-11-24 NOTE — Telephone Encounter (Signed)
Refilled benazepril per refill protocol 

## 2015-12-15 ENCOUNTER — Ambulatory Visit (INDEPENDENT_AMBULATORY_CARE_PROVIDER_SITE_OTHER): Payer: Medicare Other

## 2015-12-15 ENCOUNTER — Other Ambulatory Visit: Payer: Self-pay | Admitting: Family Medicine

## 2015-12-15 DIAGNOSIS — Z23 Encounter for immunization: Secondary | ICD-10-CM

## 2015-12-15 DIAGNOSIS — F329 Major depressive disorder, single episode, unspecified: Secondary | ICD-10-CM

## 2015-12-15 DIAGNOSIS — F32A Depression, unspecified: Secondary | ICD-10-CM

## 2015-12-22 ENCOUNTER — Ambulatory Visit (INDEPENDENT_AMBULATORY_CARE_PROVIDER_SITE_OTHER): Payer: Medicare Other

## 2015-12-22 DIAGNOSIS — Z7901 Long term (current) use of anticoagulants: Secondary | ICD-10-CM

## 2015-12-22 DIAGNOSIS — I4891 Unspecified atrial fibrillation: Secondary | ICD-10-CM | POA: Diagnosis not present

## 2015-12-22 LAB — POCT INR: INR: 1.6

## 2015-12-30 ENCOUNTER — Other Ambulatory Visit: Payer: Self-pay | Admitting: Physician Assistant

## 2015-12-30 DIAGNOSIS — R0602 Shortness of breath: Secondary | ICD-10-CM

## 2015-12-30 DIAGNOSIS — I10 Essential (primary) hypertension: Secondary | ICD-10-CM

## 2016-01-12 ENCOUNTER — Ambulatory Visit (INDEPENDENT_AMBULATORY_CARE_PROVIDER_SITE_OTHER): Payer: Medicare Other | Admitting: *Deleted

## 2016-01-12 DIAGNOSIS — Z7901 Long term (current) use of anticoagulants: Secondary | ICD-10-CM | POA: Diagnosis not present

## 2016-01-12 DIAGNOSIS — I4891 Unspecified atrial fibrillation: Secondary | ICD-10-CM | POA: Diagnosis not present

## 2016-01-12 LAB — POCT INR: INR: 1.6

## 2016-01-19 DIAGNOSIS — N3020 Other chronic cystitis without hematuria: Secondary | ICD-10-CM | POA: Diagnosis not present

## 2016-01-19 DIAGNOSIS — R3129 Other microscopic hematuria: Secondary | ICD-10-CM | POA: Diagnosis not present

## 2016-01-19 DIAGNOSIS — Z8551 Personal history of malignant neoplasm of bladder: Secondary | ICD-10-CM | POA: Diagnosis not present

## 2016-01-26 ENCOUNTER — Ambulatory Visit (INDEPENDENT_AMBULATORY_CARE_PROVIDER_SITE_OTHER): Payer: Medicare Other | Admitting: *Deleted

## 2016-01-26 DIAGNOSIS — I4891 Unspecified atrial fibrillation: Secondary | ICD-10-CM | POA: Diagnosis not present

## 2016-01-26 DIAGNOSIS — Z7901 Long term (current) use of anticoagulants: Secondary | ICD-10-CM | POA: Diagnosis not present

## 2016-01-26 LAB — POCT INR: INR: 2.4

## 2016-02-16 ENCOUNTER — Ambulatory Visit (INDEPENDENT_AMBULATORY_CARE_PROVIDER_SITE_OTHER): Payer: Medicare Other | Admitting: *Deleted

## 2016-02-16 ENCOUNTER — Other Ambulatory Visit: Payer: Self-pay | Admitting: Family Medicine

## 2016-02-16 DIAGNOSIS — I4891 Unspecified atrial fibrillation: Secondary | ICD-10-CM

## 2016-02-16 DIAGNOSIS — Z7901 Long term (current) use of anticoagulants: Secondary | ICD-10-CM | POA: Diagnosis not present

## 2016-02-16 LAB — POCT INR: INR: 3.3

## 2016-02-19 ENCOUNTER — Other Ambulatory Visit: Payer: Self-pay | Admitting: Family Medicine

## 2016-02-20 ENCOUNTER — Other Ambulatory Visit: Payer: Self-pay | Admitting: Internal Medicine

## 2016-03-01 ENCOUNTER — Ambulatory Visit (INDEPENDENT_AMBULATORY_CARE_PROVIDER_SITE_OTHER): Payer: Medicare Other

## 2016-03-01 DIAGNOSIS — I4891 Unspecified atrial fibrillation: Secondary | ICD-10-CM

## 2016-03-01 DIAGNOSIS — Z7901 Long term (current) use of anticoagulants: Secondary | ICD-10-CM

## 2016-03-01 LAB — POCT INR: INR: 2.2

## 2016-03-19 ENCOUNTER — Other Ambulatory Visit: Payer: Self-pay | Admitting: Family Medicine

## 2016-03-20 ENCOUNTER — Telehealth: Payer: Self-pay | Admitting: Family Medicine

## 2016-03-20 NOTE — Telephone Encounter (Signed)
Patient scheduled an appointment for 03/21/16.  She has enough medication until her appointment.

## 2016-03-20 NOTE — Telephone Encounter (Signed)
I am receiving a refill request for one of pts BP medications. I do not think she has been seen in over 6 months (and that was for [facility]). If this is the case, then we can bridge her, but she needs an appt.

## 2016-03-21 ENCOUNTER — Encounter: Payer: Self-pay | Admitting: Family Medicine

## 2016-03-21 ENCOUNTER — Ambulatory Visit (INDEPENDENT_AMBULATORY_CARE_PROVIDER_SITE_OTHER): Payer: Medicare Other | Admitting: Family Medicine

## 2016-03-21 VITALS — BP 123/82 | HR 64 | Temp 97.6°F | Resp 16 | Wt 192.2 lb

## 2016-03-21 DIAGNOSIS — I10 Essential (primary) hypertension: Secondary | ICD-10-CM

## 2016-03-21 MED ORDER — AMLODIPINE BESYLATE 10 MG PO TABS: 10.0000 mg | ORAL_TABLET | Freq: Every day | ORAL | 1 refills | Status: DC

## 2016-03-21 MED ORDER — FUROSEMIDE 20 MG PO TABS: ORAL_TABLET | ORAL | 1 refills | Status: DC

## 2016-03-21 MED ORDER — METOPROLOL TARTRATE 50 MG PO TABS: 25.0000 mg | ORAL_TABLET | Freq: Two times a day (BID) | ORAL | 1 refills | Status: DC

## 2016-03-21 MED ORDER — BENAZEPRIL HCL 20 MG PO TABS: 20.0000 mg | ORAL_TABLET | Freq: Every day | ORAL | 1 refills | Status: DC

## 2016-03-21 NOTE — Progress Notes (Signed)
Pre visit review using our clinic review tool, if applicable. No additional management support is needed unless otherwise documented below in the visit note. 

## 2016-03-21 NOTE — Patient Instructions (Signed)
It was a pleasure seeing you today. Your bp was normal on recheck, still monitor at home at make certain below [id number].    Low salt diet.  Follow up 6 months on BP   Please help [place] help you:  It is a privilege to be able to take care of great patients such as yourself. We are honored you have chosen [facility] for your Primary Care home. Below you will find basic instructions that you may need to access in the future. Please help [place] help you by reading the instructions, which cover many of the frequent questions we experience.   Prescription refills and request:  -In order to allow more efficient response time, please call your pharmacy for all refills. They will forward the request electronically to [place]. This allows for the quickest possible response. Request left on a nurse line can take longer to refill, since these are checked as time allows between office patients and other phone calls.  - refill request can take up to 3-5 working days to complete.  - If request is sent electronically and request is appropiate, it is usually completed in 1-2 business days.  - all patients will need to be seen routinely for all chronic medical conditions requiring prescription medications (see follow-up below). If you are overdue for follow up on your condition, you will be asked to make an appointment and we will call in enough medication to cover you until your appointment (up to 30 days).  - all controlled substances will require a face to face visit to request/refill.  - if you desire your prescriptions to go through a new pharmacy, and have an active script at original pharmacy, you will need to call your pharmacy and have scripts transferred to new pharmacy. This is completed between the pharmacy locations and not by your provider.    Results: If any images or labs were ordered, it can take up to 1 week to get results depending on the test ordered and the lab/facility running and resulting the  test. - Normal or stable results, which do not need further discussion, will be released to your mychart immediately with attached note to you. A call will not be generated for normal results. Please make certain to sign up for mychart. If you have questions on how to activate your mychart you can call the front office.  - If your results need further discussion, our office will attempt to contact you via phone, and if unable to reach you after 2 attempts, we will release your abnormal result to your mychart with instructions.  - All results will be automatically released in mychart after 1 week.  - Your provider will provide you with explanation and instruction on all relevant material in your results. Please keep in mind, results and labs may appear confusing or abnormal to the untrained eye, but it does not mean they are actually abnormal for you personally. If you have any questions about your results that are not covered, or you desire more detailed explanation than what was provided, you should make an appointment with your provider to do so.   Our office handles many outgoing and incoming calls daily. If we have not contacted you within 1 week about your results, please check your mychart to see if there is a message first and if not, then contact our office.  In helping with this matter, you help decrease call volume, and therefore allow [place] to be able to respond to  patients needs more efficiently.   Acute office visits (sick visit):  An acute visit is intended for a new problem and are scheduled in shorter time slots to allow schedule openings for patients with new problems. This is the appropriate visit to discuss a new problem. In order to provide you with excellent quality medical care with proper time for you to explain your problem, have an exam and receive treatment with instructions, these appointments should be limited to one new problem per visit. If you experience a new problem, in which  you desire to be addressed, please make an acute office visit, we save openings on the schedule to accommodate you. Please do not save your new problem for any other type of visit, let us take care of it properly and quickly for you.   Follow up visits:  Depending on your condition(s) your provider will need to see you routinely in order to provide you with quality care and prescribe medication(s). Most chronic conditions (Example: hypertension, Diabetes, depression/anxiety... etc), require visits a couple times a year. Your provider will instruct you on proper follow up for your personal medical conditions and history. Please make certain to make follow up appointments for your condition as instructed. Failing to do so could result in lapse in your medication treatment/refills. If you request a refill, and are overdue to be seen on a condition, we will always provide you with a 30 day script (once) to allow you time to schedule.    Medicare wellness (well visit): - we have a wonderful Nurse Maudie Mercury), that will meet with you and provide you will yearly medicare wellness visits. These visits should occur yearly (can not be scheduled less than 1 calendar year apart) and cover preventive health, immunizations, advance directives and screenings you are entitled to yearly through your medicare benefits. Do not miss out on your entitled benefits, this is when medicare will pay for these benefits to be ordered for you.  These are strongly encouraged by your provider and is the appropriate type of visit to make certain you are up to date with all preventive health benefits. If you have not had your medicare wellness exam in the last 12 months, please make certain to schedule one by calling the office and schedule your medicare wellness with Maudie Mercury as soon as possible.   Yearly physical (well visit):  - Adults are recommended to be seen yearly for physicals. Check with your insurance and date of your last physical, most  insurances require one calendar year between physicals. Physicals include all preventive health topics, screenings, medical exam and labs that are appropriate for gender/age and history. You may have fasting labs needed at this visit. This is a well visit (not a sick visit), acute topics should not be covered during this visit.  - Pediatric patients are seen more frequently when they are younger. Your provider will advise you on well child visit timing that is appropriate for your their age. - This is not a medicare wellness visit. Medicare wellness exams do not have an exam portion to the visit. Some medicare companies allow for a physical, some do not allow a yearly physical. If your medicare allows a yearly physical you can schedule the medicare wellness with our nurse Maudie Mercury and have your physical with your provider after, on the same day. Please check with insurance for your full benefits.   Late Policy/No Shows:  - all new patients should arrive 15-30 minutes earlier than appointment to allow [place]  time  to  obtain all personal demographics,  insurance information and for you to complete office paperwork. - All established patients should arrive 10-15 minutes earlier than appointment time to update all information and be checked in .  - In our best efforts to run on time, if you are late for your appointment you will be asked to either reschedule or if able, we will work you back into the schedule. There will be a wait time to work you back in the schedule,  depending on availability.  - If you are unable to make it to your appointment as scheduled, please call 24 hours ahead of time to allow [place] to fill the time slot with someone else who needs to be seen. If you do not cancel your appointment ahead of time, you may be charged a no show fee.

## 2016-03-21 NOTE — Progress Notes (Signed)
Jennifer House , Apr 07, 1945, 71 y.o., female MRN: [id number] Patient Care Team    Relationship Specialty Notifications Start End  Ma Hillock, DO PCP - General Family Medicine  01/23/15   Thompson Grayer, MD Attending Physician Cardiology  07/25/11   Ronald Lobo, MD Consulting Physician Gastroenterology  08/25/15   Rana Snare, MD Consulting Physician Urology  08/25/15   Renelda Loma, OD  Optometry  08/25/15    Comment: "kimberly" at [facility]    CC: HTN Subjective: Pt presents for an OV for follow-up on hypertension.  Hypertension:Pt reports compliance with benazepril 20 mg, amlodipine 10 mg, metoprolol 50 BID, lasix 20 mg (MWF). She is feeling well. She denies chest pain, shortness of breath, dizziness or LE edema. She is not exercising routinely, she had been watching her diet, however she did eat more salt than her usual over the holidays.    Allergies  Allergen Reactions  . Ibandronate Sodium Other (See Comments)    Bones hurt  . Pravachol [Pravastatin Sodium] Other (See Comments)    Myalgias   . Ultram [Tramadol Hcl]     nausea   Social History  Substance Use Topics  . Smoking status: Former Smoker    Quit date: 03/18/1985  . Smokeless tobacco: Never Used     Comment: denies   . Alcohol use No   Past Medical History:  Diagnosis Date  . Anxiety   . Atrial fibrillation ([place])    longstanding persistent  . 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 Risa Grill  . Colon polyps   . Depression   . GERD (gastroesophageal reflux disease)   . Headache(784.0)    hx of migraines  . Hematuria    hx  . HLD (hyperlipidemia)    mixed  . HTN (hypertension)   . Hypothyroidism   . Insomnia   . Liver function test abnormality    hx fatty liver  . Murmur, cardiac   . Nonalcoholic fatty liver disease   . Obesity   . Osteoarthritis    low back pain with left sciatica  . Osteopenia    dexa 2013  . Renal calculus    hx  . Restless leg syndrome    Past Surgical  History:  Procedure Laterality Date  . APPENDECTOMY    . CARDIAC CATHETERIZATION  1/12   revealed normal cors and LV function  . COLONOSCOPY    . cystocopy  10/17/03   left stent placement, TURBT  . FOOT SURGERY    . LUMBAR LAMINECTOMY/DECOMPRESSION MICRODISCECTOMY N/A 01/19/2013   Procedure: LUMBAR THREE FOUR LUMBAR LAMINECTOMY/DECOMPRESSION MICRODISCECTOMY 1 LEVEL;  Surgeon: Faythe Ghee, MD;  Location: [facility];  Service: Neurosurgery;  Laterality: N/A;  . TOTAL ABDOMINAL HYSTERECTOMY  1985   Family History  Problem Relation Age of Onset  . Heart failure Mother   . Heart attack Mother   . Hypertension Mother   . Thyroid cancer Mother   . Stroke Father   . Hypertension Father   . Stroke Brother   . Breast cancer Neg Hx   . Colon cancer Neg Hx    Allergies as of 03/21/2016      Reactions   Ibandronate Sodium Other (See Comments)   Bones hurt   Pravachol [pravastatin Sodium] Other (See Comments)   Myalgias   Ultram [tramadol Hcl]    nausea      Medication List       Accurate as of 03/21/16  9:02 AM. Always use your most recent med list.          amLODipine 10 MG tablet Commonly known as:  NORVASC Take 1 tablet (10 mg total) by mouth daily.   benazepril 20 MG tablet Commonly known as:  LOTENSIN Take 1 tablet (20 mg total) by mouth daily.   CALTRATE 600 1500 (600 Ca) MG Tabs tablet Generic drug:  calcium carbonate Take 3 tablets by mouth daily.   CENTRUM SILVER tablet Take 1 tablet by mouth daily.   cholecalciferol 1000 units tablet Commonly known as:  VITAMIN D Take 1,000 Units by mouth daily.   escitalopram 10 MG tablet Commonly known as:  LEXAPRO TAKE 1 TABLET BY MOUTH EVERY DAY   ezetimibe 10 MG tablet Commonly known as:  ZETIA Take 1 tablet (10 mg total) by mouth daily.   furosemide 20 MG tablet Commonly known as:  LASIX TAKE 1 TABLET BY MOUTH EVERY MONDAY,WEDNESDAY AND FRIDAY   HYDROcodone-acetaminophen 10-325 MG tablet Commonly known as:   NORCO Take 1 tablet by mouth every 8 (eight) hours as needed.   levothyroxine 75 MCG tablet Commonly known as:  SYNTHROID Take 1 tablet (75 mcg total) by mouth daily.   metoprolol 50 MG tablet Commonly known as:  LOPRESSOR Take 0.5 tablets (25 mg total) by mouth 2 (two) times daily.   nitroGLYCERIN 0.4 MG SL tablet Commonly known as:  NITROSTAT PLACE 1 TABLET UNDER THE TONGUE EVERY 5 MINUTES AS NEEDED FOR CHEST PAIN,(MAX 3 TABLETS)   traZODone 50 MG tablet Commonly known as:  DESYREL TAKE 1 TO 2 TABLETS(50 TO 100 MG) BY MOUTH AT BEDTIME AS NEEDED FOR SLEEP   warfarin 5 MG tablet Commonly known as:  COUMADIN TAKE AS DIRECTED BY COUMADIN CLINIC       No results found for this or any previous visit (from the past 24 hour(s)). No results found.   ROS: Negative, with the exception of above mentioned in HPI   Objective:  BP 123/82   Pulse 64   Temp 97.6 F (36.4 C) (Oral)   Resp 16   Wt 192 lb 4 oz (87.2 kg)   SpO2 96%   BMI 31.99 kg/m  Body mass index is 31.99 kg/m. Gen: Afebrile. No acute distress. Nontoxic in appearance, well developed, well nourished.  HENT: AT. [place]. MMM Eyes:Pupils Equal Round Reactive to light, Extraocular movements intact,  Conjunctiva without redness, discharge or icterus. CV: RRR , no edema Chest: CTAB, no wheeze or crackles. Good air movement, normal resp effort.   Assessment/Plan: Jennifer House is a 71 y.o. female present for  OV for  Essential hypertension, benign - stable BP on repeat check. - pt to monitor BP at home, call if above 140/90 routinely.  - low salt diet, increase exercise.  - metoprolol (LOPRESSOR) 50 MG tablet; Take 0.5 tablets (25 mg total) by mouth 2 (two) times daily.  Dispense: 180 tablet; Refill: 1 - benazepril (LOTENSIN) 20 MG tablet; Take 1 tablet (20 mg total) by mouth daily.  Dispense: 90 tablet; Refill: 1 - amLODipine (NORVASC) 10 MG tablet; Take 1 tablet (10 mg total) by mouth daily.  Dispense: 90 tablet;  Refill: 1 - furosemide (LASIX) 20 MG tablet; TAKE 1 TABLET BY MOUTH EVERY MONDAY,WEDNESDAY AND FRIDAY  Dispense: 45 tablet; Refill: 1 - F/U 6 months as  Long as BP in normal ranges at home.     electronically signed by:  Howard Pouch, DO  [place]

## 2016-03-27 ENCOUNTER — Other Ambulatory Visit: Payer: Self-pay | Admitting: Family Medicine

## 2016-03-29 ENCOUNTER — Ambulatory Visit (INDEPENDENT_AMBULATORY_CARE_PROVIDER_SITE_OTHER): Payer: Medicare Other

## 2016-03-29 DIAGNOSIS — I4891 Unspecified atrial fibrillation: Secondary | ICD-10-CM

## 2016-03-29 DIAGNOSIS — Z7901 Long term (current) use of anticoagulants: Secondary | ICD-10-CM

## 2016-03-29 LAB — POCT INR: INR: 2.8

## 2016-03-30 ENCOUNTER — Other Ambulatory Visit: Payer: Self-pay | Admitting: Family Medicine

## 2016-04-01 ENCOUNTER — Other Ambulatory Visit: Payer: Self-pay

## 2016-04-01 MED ORDER — LEVOTHYROXINE SODIUM 75 MCG PO TABS: 75.0000 ug | ORAL_TABLET | Freq: Every day | ORAL | 1 refills | Status: DC

## 2016-04-01 NOTE — Telephone Encounter (Signed)
Refill sent.

## 2016-04-26 ENCOUNTER — Ambulatory Visit (INDEPENDENT_AMBULATORY_CARE_PROVIDER_SITE_OTHER): Payer: Medicare Other | Admitting: *Deleted

## 2016-04-26 DIAGNOSIS — Z7901 Long term (current) use of anticoagulants: Secondary | ICD-10-CM

## 2016-04-26 DIAGNOSIS — I4891 Unspecified atrial fibrillation: Secondary | ICD-10-CM

## 2016-04-26 LAB — POCT INR: INR: 2

## 2016-05-26 ENCOUNTER — Other Ambulatory Visit: Payer: Self-pay | Admitting: Family Medicine

## 2016-05-26 DIAGNOSIS — F32A Depression, unspecified: Secondary | ICD-10-CM

## 2016-05-26 DIAGNOSIS — F329 Major depressive disorder, single episode, unspecified: Secondary | ICD-10-CM

## 2016-05-31 ENCOUNTER — Ambulatory Visit (INDEPENDENT_AMBULATORY_CARE_PROVIDER_SITE_OTHER): Payer: Medicare Other | Admitting: *Deleted

## 2016-05-31 DIAGNOSIS — Z7901 Long term (current) use of anticoagulants: Secondary | ICD-10-CM | POA: Diagnosis not present

## 2016-05-31 DIAGNOSIS — I4891 Unspecified atrial fibrillation: Secondary | ICD-10-CM | POA: Diagnosis not present

## 2016-05-31 LAB — POCT INR: INR: 2.9

## 2016-06-04 ENCOUNTER — Telehealth: Payer: Self-pay | Admitting: Family Medicine

## 2016-06-04 NOTE — Telephone Encounter (Signed)
**  Remind patient they can make refill requests via MyChart**  Medication refill request (Name & Dosage):   escitalopram (LEXAPRO) 10 MG tablet  Preferred pharmacy (Name & Address):  [facility] [address]   Other comments (if applicable):    Pharmacy tech needs a new script put in.

## 2016-06-05 ENCOUNTER — Encounter: Payer: Self-pay | Admitting: *Deleted

## 2016-06-05 NOTE — Telephone Encounter (Signed)
Patient needs an appt for refills message sent in my chart.

## 2016-07-02 ENCOUNTER — Other Ambulatory Visit: Payer: Self-pay | Admitting: Internal Medicine

## 2016-07-05 ENCOUNTER — Ambulatory Visit (INDEPENDENT_AMBULATORY_CARE_PROVIDER_SITE_OTHER): Payer: Medicare Other | Admitting: *Deleted

## 2016-07-05 DIAGNOSIS — Z7901 Long term (current) use of anticoagulants: Secondary | ICD-10-CM

## 2016-07-05 DIAGNOSIS — I4891 Unspecified atrial fibrillation: Secondary | ICD-10-CM

## 2016-07-05 LAB — POCT INR: INR: 3.6

## 2016-07-08 ENCOUNTER — Encounter: Payer: Self-pay | Admitting: Family Medicine

## 2016-07-08 ENCOUNTER — Telehealth: Payer: Self-pay | Admitting: Family Medicine

## 2016-07-08 ENCOUNTER — Ambulatory Visit (INDEPENDENT_AMBULATORY_CARE_PROVIDER_SITE_OTHER): Payer: Medicare Other | Admitting: Family Medicine

## 2016-07-08 VITALS — BP 130/78 | HR 62 | Temp 97.8°F | Resp 20 | Ht 65.0 in | Wt 193.2 lb

## 2016-07-08 DIAGNOSIS — L608 Other nail disorders: Secondary | ICD-10-CM | POA: Diagnosis not present

## 2016-07-08 DIAGNOSIS — E039 Hypothyroidism, unspecified: Secondary | ICD-10-CM

## 2016-07-08 DIAGNOSIS — F3341 Major depressive disorder, recurrent, in partial remission: Secondary | ICD-10-CM

## 2016-07-08 DIAGNOSIS — R52 Pain, unspecified: Secondary | ICD-10-CM | POA: Diagnosis not present

## 2016-07-08 LAB — TSH: TSH: 2.15 u[IU]/mL (ref 0.35–4.50)

## 2016-07-08 MED ORDER — LEVOTHYROXINE SODIUM 75 MCG PO TABS: 75.0000 ug | ORAL_TABLET | Freq: Every day | ORAL | 3 refills | Status: DC

## 2016-07-08 MED ORDER — ESCITALOPRAM OXALATE 20 MG PO TABS: ORAL_TABLET | ORAL | 0 refills | Status: DC

## 2016-07-08 NOTE — Progress Notes (Signed)
Jennifer House , 1946/02/07, 71 y.o., female MRN: [id number] Patient Care Team    Relationship Specialty Notifications Start End  Ma Hillock, DO PCP - General Family Medicine  01/23/15   Thompson Grayer, MD Attending Physician Cardiology  07/25/11   Ronald Lobo, MD Consulting Physician Gastroenterology  08/25/15   Rana Snare, MD Consulting Physician Urology  08/25/15   Amy Curley Spice, OD  Optometry  08/25/15    Comment: "kimberly" at [facility]    CC: anxiety Subjective:  Depression/anxiety/bodyaches: She has been without her lexapro for about 3 months secondary to the pharmacy misplacing her script. She states she has definitely felt the difference and has more anxiety and depression. She has her husband to take care of and her son has been diagnosed with a neurodegenerative disorder of his brain and not expected to live past 3 more years. She felt ok on the lexapro 10 mg, but felt it could be a little higher. She has endorsed increase diffuse body aches for about 3 months since being off the lexapro. She has a significant h/o hypothyroid and zeita use.   Toe nail deformity: Pt states she has had lifted toe nails for a few years. She has been catching the right 2 nd toe nail under the fridge causing injury. She reports the 1-2 toe on both feet have been deformed for some time.    Prior Note:  Patient presents for follow-up on her depression. On her last appointment  4 weeks ago patient was started on Lexapro 10 mg. She had been on Prozac in the past, but this has been discontinued by her prior PCP.  Patient states she feels much better now that she is on the Lexapro. She states that it has completely resolves her anxiety and depression. She has no negative side effects to report. He would like to continue medication at current dose.   Depression screen [facility] 2/9 07/08/2016 08/25/2015 02/22/2015  Decreased Interest 1 0 0  Down, Depressed, Hopeless 2 0 0  PHQ - 2 Score 3 0 0  Altered sleeping 2 -  -  Tired, decreased energy 3 - -  Change in appetite 3 - -  Feeling bad or failure about yourself  0 - -  Trouble concentrating 2 - -  Moving slowly or fidgety/restless 2 - -  Suicidal thoughts 0 - -  PHQ-9 Score 15 - -   GAD 7 : Generalized Anxiety Score 07/08/2016  Nervous, Anxious, on Edge 3  Control/stop worrying 2  Worry too much - different things 0  Trouble relaxing 2  Restless 2  Easily annoyed or irritable 3  Afraid - awful might happen 2  Total GAD 7 Score 14  Anxiety Difficulty Somewhat difficult    Allergies  Allergen Reactions  . Ibandronate Sodium Other (See Comments)    Bones hurt  . Pravachol [Pravastatin Sodium] Other (See Comments)    Myalgias   . Ultram [Tramadol Hcl]     nausea   Social History  Substance Use Topics  . Smoking status: Former Smoker    Quit date: 03/18/1985  . Smokeless tobacco: Never Used     Comment: denies   . Alcohol use No   Past Medical History:  Diagnosis Date  . Anxiety   . Atrial fibrillation ([place])    longstanding persistent  . 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 Risa Grill  . Colon polyps   . Depression   . GERD (  gastroesophageal reflux disease)   . Headache(784.0)    hx of migraines  . Hematuria    hx  . HLD (hyperlipidemia)    mixed  . HTN (hypertension)   . Hypothyroidism   . Insomnia   . Liver function test abnormality    hx fatty liver  . Murmur, cardiac   . Nonalcoholic fatty liver disease   . Obesity   . Osteoarthritis    low back pain with left sciatica  . Osteopenia    dexa 2013  . Renal calculus    hx  . Restless leg syndrome    Past Surgical History:  Procedure Laterality Date  . APPENDECTOMY    . CARDIAC CATHETERIZATION  1/12   revealed normal cors and LV function  . COLONOSCOPY    . cystocopy  10/17/03   left stent placement, TURBT  . FOOT SURGERY    . LUMBAR LAMINECTOMY/DECOMPRESSION MICRODISCECTOMY N/A 01/19/2013   Procedure: LUMBAR THREE FOUR LUMBAR  LAMINECTOMY/DECOMPRESSION MICRODISCECTOMY 1 LEVEL;  Surgeon: Faythe Ghee, MD;  Location: [facility];  Service: Neurosurgery;  Laterality: N/A;  . TOTAL ABDOMINAL HYSTERECTOMY  1985   Family History  Problem Relation Age of Onset  . Heart failure Mother   . Heart attack Mother   . Hypertension Mother   . Thyroid cancer Mother   . Stroke Father   . Hypertension Father   . Stroke Brother   . Breast cancer Neg Hx   . Colon cancer Neg Hx    Allergies as of 07/08/2016      Reactions   Ibandronate Sodium Other (See Comments)   Bones hurt   Pravachol [pravastatin Sodium] Other (See Comments)   Myalgias   Ultram [tramadol Hcl]    nausea      Medication List       Accurate as of 07/08/16 12:50 PM. Always use your most recent med list.          amLODipine 10 MG tablet Commonly known as:  NORVASC Take 1 tablet (10 mg total) by mouth daily.   benazepril 20 MG tablet Commonly known as:  LOTENSIN Take 1 tablet (20 mg total) by mouth daily.   CALTRATE 600 1500 (600 Ca) MG Tabs tablet Generic drug:  calcium carbonate Take 3 tablets by mouth daily.   CENTRUM SILVER tablet Take 1 tablet by mouth daily.   cholecalciferol 1000 units tablet Commonly known as:  VITAMIN D Take 1,000 Units by mouth daily.   escitalopram 20 MG tablet Commonly known as:  LEXAPRO 1/2 tab daily for 1 week, then increase to 1 tab daily.   ezetimibe 10 MG tablet Commonly known as:  ZETIA TAKE 1 TABLET BY MOUTH DAILY   furosemide 20 MG tablet Commonly known as:  LASIX TAKE 1 TABLET BY MOUTH EVERY MONDAY,WEDNESDAY AND FRIDAY   levothyroxine 75 MCG tablet Commonly known as:  SYNTHROID Take 1 tablet (75 mcg total) by mouth daily.   metoprolol 50 MG tablet Commonly known as:  LOPRESSOR Take 0.5 tablets (25 mg total) by mouth 2 (two) times daily.   nitroGLYCERIN 0.4 MG SL tablet Commonly known as:  NITROSTAT PLACE 1 TABLET UNDER THE TONGUE EVERY 5 MINUTES AS NEEDED FOR CHEST PAIN,(MAX 3  TABLETS)   traZODone 50 MG tablet Commonly known as:  DESYREL TAKE 1 TO 2 TABLETS(50 TO 100 MG) BY MOUTH AT BEDTIME AS NEEDED FOR SLEEP   warfarin 5 MG tablet Commonly known as:  COUMADIN TAKE AS DIRECTED  No results found for this or any previous visit (from the past 24 hour(s)). No results found.   ROS: Negative, with the exception of above mentioned in HPI   Objective:  BP 130/78 (BP Location: Right Arm, Patient Position: Sitting, Cuff Size: Large)   Pulse 62   Temp 97.8 F (36.6 C)   Resp 20   Ht 5\' 5"  (1.651 m)   Wt 193 lb 4 oz (87.7 kg)   SpO2 99%   BMI 32.16 kg/m  Body mass index is 32.16 kg/m. Gen: Afebrile. No acute distress. Nontoxic in appearance, well developed, well nourished. Very pleasant caucasian female.  HENT: AT. [place]. MMM CV: RRR, no edema Chest: CTAB, no wheeze or crackles. Good air movement, normal resp effort.  MSK: moves all four ext without difficulty, no obvious deformity.  Skin: no rashes, purpura or petechiae. Thickened, raised/lifted 45 degree angle of 1st and 2nd toe nails bilaterally. Dried blood right 2nd toenail. No erythema or drainage. No signs of infection.  Neuro:  Normal gait. PERLA. EOMi. Alert. Oriented x3  Psych: appears sad today, otherwise Normal affect, dress and demeanor. Normal speech. Normal thought content and judgment.  Assessment/Plan: JESSICE MADILL is a 71 y.o. female present for OV for  Hypothyroidism, unspecified type/d/bodyaches - it has been over 1.5 years since TSH has been checked, with reports of bodyaches. Will r/o thyroid as cause. Possibly secondary to increased depression since off lexapro. Could also consider zeita as potential cause if does not improve with restart of lexapro.  - TSH  Depression/Anxiety: - restart lexapro, taper up to lexapro 20 mg QD.  - PHQ 15, GAD 14 today.  - f/u 3 months with other chronic issues.   Acquired deformity of nail - Ambulatory referral to Podiatry   Reviewed  expectations re: course of current medical issues.  Discussed self-management of symptoms.  Outlined signs and symptoms indicating need for more acute intervention.  Patient verbalized understanding and all questions were answered.  Patient received an After-Visit Summary.   electronically signed by:  Howard Pouch, DO  [place]

## 2016-07-08 NOTE — Patient Instructions (Signed)
Start lexapro 1/2 tab for 1 week, then increase to 1 tab daily.  Follow up in 3 months as long as doing well.   I have also referred you to the podiatrist for your toe nails. They will call to schedule you.    It was nice to see you today.

## 2016-07-08 NOTE — Telephone Encounter (Signed)
Please call pt her thyroid is functioning normal.

## 2016-07-09 NOTE — Telephone Encounter (Signed)
Spoke with patient reviewed results.

## 2016-07-24 ENCOUNTER — Ambulatory Visit (INDEPENDENT_AMBULATORY_CARE_PROVIDER_SITE_OTHER): Payer: Medicare Other | Admitting: Podiatry

## 2016-07-24 ENCOUNTER — Encounter: Payer: Self-pay | Admitting: Podiatry

## 2016-07-24 DIAGNOSIS — L601 Onycholysis: Secondary | ICD-10-CM

## 2016-07-26 ENCOUNTER — Ambulatory Visit (INDEPENDENT_AMBULATORY_CARE_PROVIDER_SITE_OTHER): Payer: Medicare Other | Admitting: Pharmacist

## 2016-07-26 DIAGNOSIS — I4891 Unspecified atrial fibrillation: Secondary | ICD-10-CM | POA: Diagnosis not present

## 2016-07-26 DIAGNOSIS — Z7901 Long term (current) use of anticoagulants: Secondary | ICD-10-CM

## 2016-07-26 LAB — POCT INR: INR: 2.6

## 2016-07-26 NOTE — Progress Notes (Signed)
   Subjective: Patient presents today for possible treatment and evaluation of fungal nails of digits 1 and 2 bilaterally. Pt states the nails are thickened and are lifting from the nail beds. She has been applying a topical antifungal with no significant relief. Patient presents today for further treatment and evaluation.  Objective: Physical Exam General: The patient is alert and oriented x3 in no acute distress.  Dermatology: Hyperkeratotic, discolored, thickened, onychodystrophy of nails noted bilaterally.  Skin is warm, dry and supple bilateral lower extremities. Negative for open lesions or macerations.  Vascular: Palpable pedal pulses bilaterally. No edema or erythema noted. Capillary refill within normal limits.  Neurological: Epicritic and protective threshold grossly intact bilaterally.   Musculoskeletal Exam: Range of motion within normal limits to all pedal and ankle joints bilateral. Muscle strength 5/5 in all groups bilateral.   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 #2 Mechanical debridement of nails 1-2 bilaterally performed using a nail nipper. Filed with dremel without incident.  #3 Return to clinic in 8 weeks. If not better, total permanent nail avulsions will be performed.   Edrick Kins, DPM [facility]  Dr. Edrick Kins, [place]                                        [place], [place] [address]                Office [phone]  Fax [phone]

## 2016-08-30 ENCOUNTER — Ambulatory Visit (INDEPENDENT_AMBULATORY_CARE_PROVIDER_SITE_OTHER): Payer: Medicare Other | Admitting: Pharmacist

## 2016-08-30 DIAGNOSIS — I4891 Unspecified atrial fibrillation: Secondary | ICD-10-CM | POA: Diagnosis not present

## 2016-08-30 DIAGNOSIS — Z7901 Long term (current) use of anticoagulants: Secondary | ICD-10-CM

## 2016-08-30 LAB — POCT INR: INR: 2.9

## 2016-09-12 ENCOUNTER — Other Ambulatory Visit: Payer: Self-pay | Admitting: Family Medicine

## 2016-09-13 NOTE — Telephone Encounter (Signed)
Please call pt: - I have received a refill request for trazodone. I am not certain if this is even a current med for her now given it has been a year since we filled it (with about 6 months of meds). - I am ok refilling, as long as it was requested by the pt. We can discuss the use of it at her followup in July (she was asked to schedule for her depression).

## 2016-09-14 ENCOUNTER — Other Ambulatory Visit: Payer: Self-pay | Admitting: Family Medicine

## 2016-09-14 DIAGNOSIS — I10 Essential (primary) hypertension: Secondary | ICD-10-CM

## 2016-09-16 ENCOUNTER — Other Ambulatory Visit: Payer: Self-pay | Admitting: Family Medicine

## 2016-09-16 DIAGNOSIS — I10 Essential (primary) hypertension: Secondary | ICD-10-CM

## 2016-09-19 ENCOUNTER — Ambulatory Visit (INDEPENDENT_AMBULATORY_CARE_PROVIDER_SITE_OTHER): Payer: Medicare Other | Admitting: Family Medicine

## 2016-09-19 ENCOUNTER — Encounter: Payer: Self-pay | Admitting: Family Medicine

## 2016-09-19 VITALS — BP 126/71 | HR 61 | Temp 98.1°F | Resp 20 | Ht 65.0 in | Wt 195.0 lb

## 2016-09-19 DIAGNOSIS — E034 Atrophy of thyroid (acquired): Secondary | ICD-10-CM

## 2016-09-19 DIAGNOSIS — M8580 Other specified disorders of bone density and structure, unspecified site: Secondary | ICD-10-CM | POA: Diagnosis not present

## 2016-09-19 DIAGNOSIS — F3341 Major depressive disorder, recurrent, in partial remission: Secondary | ICD-10-CM | POA: Diagnosis not present

## 2016-09-19 DIAGNOSIS — E559 Vitamin D deficiency, unspecified: Secondary | ICD-10-CM

## 2016-09-19 DIAGNOSIS — E785 Hyperlipidemia, unspecified: Secondary | ICD-10-CM | POA: Diagnosis not present

## 2016-09-19 DIAGNOSIS — I482 Chronic atrial fibrillation: Secondary | ICD-10-CM

## 2016-09-19 DIAGNOSIS — I10 Essential (primary) hypertension: Secondary | ICD-10-CM | POA: Diagnosis not present

## 2016-09-19 DIAGNOSIS — I4821 Permanent atrial fibrillation: Secondary | ICD-10-CM

## 2016-09-19 DIAGNOSIS — Z131 Encounter for screening for diabetes mellitus: Secondary | ICD-10-CM | POA: Diagnosis not present

## 2016-09-19 DIAGNOSIS — E781 Pure hyperglyceridemia: Secondary | ICD-10-CM

## 2016-09-19 DIAGNOSIS — F331 Major depressive disorder, recurrent, moderate: Secondary | ICD-10-CM

## 2016-09-19 DIAGNOSIS — G4700 Insomnia, unspecified: Secondary | ICD-10-CM | POA: Diagnosis not present

## 2016-09-19 LAB — COMPLETE METABOLIC PANEL WITH GFR
ALT: 24 U/L (ref 6–29)
AST: 23 U/L (ref 10–35)
Albumin: 4.5 g/dL (ref 3.6–5.1)
Alkaline Phosphatase: 73 U/L (ref 33–130)
BUN: 13 mg/dL (ref 7–25)
CHLORIDE: 103 mmol/L (ref 98–110)
CO2: 25 mmol/L (ref 20–31)
CREATININE: 0.91 mg/dL (ref 0.60–0.93)
Calcium: 9.4 mg/dL (ref 8.6–10.4)
GFR, Est African American: 72 mL/min (ref >=60)
GFR, Est Non African American: 63 mL/min (ref >=60)
GLUCOSE: 98 mg/dL (ref 65–99)
POTASSIUM: 5.3 mmol/L (ref 3.5–5.3)
SODIUM: 138 mmol/L (ref 135–146)
Total Bilirubin: 0.6 mg/dL (ref 0.2–1.2)
Total Protein: 7 g/dL (ref 6.1–8.1)

## 2016-09-19 LAB — CBC WITH DIFFERENTIAL/PLATELET
BASOS ABS: 0.1 10*3/uL (ref 0.0–0.1)
BASOS PCT: 1.2 % (ref 0.0–3.0)
EOS ABS: 0.1 10*3/uL (ref 0.0–0.7)
Eosinophils Relative: 1.9 % (ref 0.0–5.0)
HEMATOCRIT: 43.2 % (ref 36.0–46.0)
Hemoglobin: 14.1 g/dL (ref 12.0–15.0)
LYMPHS PCT: 29 % (ref 12.0–46.0)
Lymphs Abs: 1.5 10*3/uL (ref 0.7–4.0)
MCHC: 32.6 g/dL (ref 30.0–36.0)
MCV: 82.9 fl (ref 78.0–100.0)
MONO ABS: 0.4 10*3/uL (ref 0.1–1.0)
Monocytes Relative: 7.6 % (ref 3.0–12.0)
NEUTROS ABS: 3.2 10*3/uL (ref 1.4–7.7)
Neutrophils Relative %: 60.3 % (ref 43.0–77.0)
PLATELETS: 204 10*3/uL (ref 150.0–400.0)
RBC: 5.2 Mil/uL — ABNORMAL HIGH (ref 3.87–5.11)
RDW: 14.2 % (ref 11.5–15.5)
WBC: 5.3 10*3/uL (ref 4.0–10.5)

## 2016-09-19 LAB — LIPID PANEL
CHOL/HDL RATIO: 4
CHOLESTEROL: 224 mg/dL — AB (ref 0–200)
HDL: 55.6 mg/dL (ref >39.00)
LDL CALC: 135 mg/dL — AB (ref 0–99)
NonHDL: 168.02
TRIGLYCERIDES: 163 mg/dL — AB (ref 0.0–149.0)
VLDL: 32.6 mg/dL (ref 0.0–40.0)

## 2016-09-19 LAB — VITAMIN D 25 HYDROXY (VIT D DEFICIENCY, FRACTURES): VITD: 45.78 ng/mL (ref 30.00–100.00)

## 2016-09-19 MED ORDER — EZETIMIBE 10 MG PO TABS: 10.0000 mg | ORAL_TABLET | Freq: Every day | ORAL | 1 refills | Status: DC

## 2016-09-19 MED ORDER — METOPROLOL TARTRATE 50 MG PO TABS: 25.0000 mg | ORAL_TABLET | Freq: Two times a day (BID) | ORAL | 1 refills | Status: DC

## 2016-09-19 MED ORDER — FUROSEMIDE 20 MG PO TABS: ORAL_TABLET | ORAL | 1 refills | Status: DC

## 2016-09-19 MED ORDER — TRAZODONE HCL 50 MG PO TABS: ORAL_TABLET | ORAL | 1 refills | Status: DC

## 2016-09-19 MED ORDER — BENAZEPRIL HCL 20 MG PO TABS: 20.0000 mg | ORAL_TABLET | Freq: Every day | ORAL | 1 refills | Status: DC

## 2016-09-19 MED ORDER — AMLODIPINE BESYLATE 10 MG PO TABS: 10.0000 mg | ORAL_TABLET | Freq: Every day | ORAL | 1 refills | Status: DC

## 2016-09-19 MED ORDER — ESCITALOPRAM OXALATE 20 MG PO TABS: 20.0000 mg | ORAL_TABLET | Freq: Every day | ORAL | 1 refills | Status: DC

## 2016-09-19 NOTE — Progress Notes (Addendum)
Subjective:   Jennifer House is a 71 y.o. female who presents for Medicare Annual (Subsequent) preventive examination.  Review of Systems:  No ROS.  Medicare Wellness Visit. Additional risk factors are reflected in the social history.  Cardiac Risk Factors include: advanced age (>29men, >85 women);dyslipidemia;hypertension;obesity (BMI >30kg/m2);family history of premature cardiovascular disease   Sleep patterns: Sleeps 9 hours, feels rested.  Home Safety/Smoke Alarms: Feels safe in home. Smoke alarms in place.  Living environment; residence and Firearm Safety: Lives with husband, adult children in 1 story home.  Seat Belt Safety/Bike Helmet: Wears seat belt.   Counseling:   Eye Exam-Last exam < 1 year. With issues only by [facility]. Plans to go to [facility] in the future.  Dental-Last exam >2 years. Full dentures. Gum care discussed.   Female:   Pap-N/A       Mammo-06/13/2014, negative. Declines testing at this time.      Dexa scan-03/19/2011. Declines testing at this time.   CCS-Colonoscopy 04/19/2011, normal. [facility]. Recall 10 years.      Objective:     Vitals: BP 126/71 (BP Location: Right Arm, Patient Position: Sitting, Cuff Size: Large)   Pulse 61   Temp 98.1 F (36.7 C)   Resp 20   Ht 5\' 5"  (1.651 m)   Wt 195 lb (88.5 kg)   SpO2 98%   BMI 32.45 kg/m   Body mass index is 32.45 kg/m.   Tobacco History  Smoking Status  . Former Smoker  . Quit date: 03/18/1985  Smokeless Tobacco  . Never Used    Comment: denies      Counseling given: Yes   Past Medical History:  Diagnosis Date  . Anxiety   . Atrial fibrillation ([place])    longstanding persistent  . 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 Risa Grill  . Colon polyps   . Depression   . GERD (gastroesophageal reflux disease)   . Headache(784.0)    hx of migraines  . Hematuria    hx  . HLD (hyperlipidemia)    mixed  . HTN (hypertension)   . Hypothyroidism   . Insomnia   . Liver  function test abnormality    hx fatty liver  . Murmur, cardiac   . Nonalcoholic fatty liver disease   . Obesity   . Osteoarthritis    low back pain with left sciatica  . Osteopenia    dexa 2013  . Renal calculus    hx  . Restless leg syndrome    Past Surgical History:  Procedure Laterality Date  . APPENDECTOMY    . CARDIAC CATHETERIZATION  1/12   revealed normal cors and LV function  . COLONOSCOPY    . cystocopy  10/17/03   left stent placement, TURBT  . FOOT SURGERY    . LUMBAR LAMINECTOMY/DECOMPRESSION MICRODISCECTOMY N/A 01/19/2013   Procedure: LUMBAR THREE FOUR LUMBAR LAMINECTOMY/DECOMPRESSION MICRODISCECTOMY 1 LEVEL;  Surgeon: Faythe Ghee, MD;  Location: [facility];  Service: Neurosurgery;  Laterality: N/A;  . TOTAL ABDOMINAL HYSTERECTOMY  1985   Family History  Problem Relation Age of Onset  . Heart failure Mother   . Heart attack Mother   . Hypertension Mother   . Thyroid cancer Mother   . Stroke Father   . Hypertension Father   . Stroke Brother   . Breast cancer Neg Hx   . Colon cancer Neg Hx    History  Sexual Activity  . Sexual activity:  No    Outpatient Encounter Prescriptions as of 09/19/2016  Medication Sig  . amLODipine (NORVASC) 10 MG tablet Take 1 tablet (10 mg total) by mouth daily.  . benazepril (LOTENSIN) 20 MG tablet Take 1 tablet (20 mg total) by mouth daily.  . Calcium Carbonate (CALTRATE 600) 1500 MG TABS Take 3 tablets by mouth daily.    . cholecalciferol (VITAMIN D) 1000 UNITS tablet Take 1,000 Units by mouth daily.    Marland Kitchen escitalopram (LEXAPRO) 20 MG tablet Take 1 tablet (20 mg total) by mouth daily.  Marland Kitchen ezetimibe (ZETIA) 10 MG tablet Take 1 tablet (10 mg total) by mouth daily.  . furosemide (LASIX) 20 MG tablet TAKE 1 TABLET BY MOUTH EVERY MONDAY,WEDNESDAY AND FRIDAY  . levothyroxine (SYNTHROID) 75 MCG tablet Take 1 tablet (75 mcg total) by mouth daily.  . metoprolol tartrate (LOPRESSOR) 50 MG tablet Take 0.5 tablets (25 mg total) by mouth  2 (two) times daily.  . Multiple Vitamins-Minerals (CENTRUM SILVER) tablet Take 1 tablet by mouth daily.    . nitroGLYCERIN (NITROSTAT) 0.4 MG SL tablet PLACE 1 TABLET UNDER THE TONGUE EVERY 5 MINUTES AS NEEDED FOR CHEST PAIN,(MAX 3 TABLETS)  . traZODone (DESYREL) 50 MG tablet TAKE 1 TO 2 TABLETS(50 TO 100 MG) BY MOUTH AT BEDTIME AS NEEDED FOR SLEEP  . warfarin (COUMADIN) 5 MG tablet TAKE AS DIRECTED  . [DISCONTINUED] amLODipine (NORVASC) 10 MG tablet TAKE 1 TABLET BY MOUTH EVERY DAY  . [DISCONTINUED] benazepril (LOTENSIN) 20 MG tablet TAKE 1 TABLET BY MOUTH EVERY DAY  . [DISCONTINUED] escitalopram (LEXAPRO) 20 MG tablet 1/2 tab daily for 1 week, then increase to 1 tab daily.  . [DISCONTINUED] ezetimibe (ZETIA) 10 MG tablet TAKE 1 TABLET BY MOUTH DAILY  . [DISCONTINUED] furosemide (LASIX) 20 MG tablet TAKE 1 TABLET BY MOUTH EVERY MONDAY,WEDNESDAY AND FRIDAY  . [DISCONTINUED] metoprolol (LOPRESSOR) 50 MG tablet Take 0.5 tablets (25 mg total) by mouth 2 (two) times daily.  . [DISCONTINUED] metoprolol tartrate (LOPRESSOR) 50 MG tablet Take 0.5 tablets (25 mg total) by mouth 2 (two) times daily.  . [DISCONTINUED] traZODone (DESYREL) 50 MG tablet TAKE 1 TO 2 TABLETS(50 TO 100 MG) BY MOUTH AT BEDTIME AS NEEDED FOR SLEEP   No facility-administered encounter medications on file as of 09/19/2016.     Activities of Daily Living In your present state of health, do you have any difficulty performing the following activities: 09/19/2016  Hearing? N  Vision? N  Difficulty concentrating or making decisions? N  Walking or climbing stairs? N  Dressing or bathing? N  Doing errands, shopping? N  Preparing Food and eating ? N  Using the Toilet? N  In the past six months, have you accidently leaked urine? N  Do you have problems with loss of bowel control? N  Managing your Medications? N  Managing your Finances? N  Housekeeping or managing your Housekeeping? N  Some recent data might be hidden    Patient  Care Team: Ma Hillock, DO as PCP - General (Family Medicine) Thompson Grayer, MD as Attending Physician (Cardiology) Ronald Lobo, MD as Consulting Physician (Gastroenterology) Rana Snare, MD as Consulting Physician (Urology) Jodi Mourning, Amy V, OD (Optometry)    Assessment:    Physical assessment deferred to PCP.  Exercise Activities and Dietary recommendations Current Exercise Habits: The patient does not participate in regular exercise at present (housework, gardening), Exercise limited by: None identified   Diet (meal preparation, eat out, water intake, caffeinated beverages, dairy products, fruits and  vegetables): Drinks water.  Breakfast: apple sauce, oatmeal, toast, coffee Lunch: sandwich Dinner: protein and vegetables      Discussed heart healthy diet and increasing activity as tolerated. Discussed silver sneakers program.  Goals      Patient Stated   . <enter goal here> (pt-stated)          Maintain current health by staying active with gardening/canning.       Fall Risk Fall Risk  09/19/2016 08/25/2015 02/22/2015  Falls in the past year? No No No   Depression Screen PHQ 2/9 Scores 09/19/2016 07/08/2016 08/25/2015 02/22/2015  PHQ - 2 Score 0 3 0 0  PHQ- 9 Score - 15 - -    Patient states she gets upset regarding son's health issue.   Cognitive Function MMSE - Mini Mental State Exam 09/19/2016  Orientation to time 5  Orientation to Place 5  Registration 3  Attention/ Calculation 2  Recall 2  Language- name 2 objects 2  Language- repeat 1  Language- follow 3 step command 3  Language- read & follow direction 1  Write a sentence 0  Copy design 1  Total score 25   Declined to complete "Writing sentence" portion.        Immunization History  Administered Date(s) Administered  . Influenza,inj,Quad PF,36+ Mos 01/23/2015, 12/15/2015  . Pneumococcal Conjugate-13 08/18/2014  . Pneumococcal Polysaccharide-23 02/27/2009, 08/25/2015  . Tdap 02/22/2015    Screening Tests Health Maintenance  Topic Date Due  . MAMMOGRAM  06/12/2016  . INFLUENZA VACCINE  10/16/2016  . COLONOSCOPY  03/18/2018  . TETANUS/TDAP  02/21/2025  . DEXA SCAN  Completed  . Hepatitis C Screening  Completed  . PNA vac Low Risk Adult  Completed      Plan:    Bring a copy of your advance directives to your next office visit.  Continue doing brain stimulating activities (puzzles, reading, adult coloring books, staying active) to keep memory sharp.   Shingrix Vaccine  I have personally reviewed and noted the following in the patient's chart:   . Medical and social history . Use of alcohol, tobacco or illicit drugs  . Current medications and supplements . Functional ability and status . Nutritional status . Physical activity . Advanced directives . List of other physicians . Hospitalizations, surgeries, and ER visits in previous 12 months . Vitals . Screenings to include cognitive, depression, and falls . Referrals and appointments  In addition, I have reviewed and discussed with patient certain preventive protocols, quality metrics, and best practice recommendations. A written personalized care plan for preventive services as well as general preventive health recommendations were provided to patient.     Gerilyn Nestle, RN  09/19/2016   PCP Notes: -Declines mammogram and DEXA scan screening at this time. -Declines Shingrix vaccine -Will obtain colonoscopy report from [facility].    Medical screening examination/treatment/procedure(s) were performed by non-physician practitioner and as supervising physician I was immediately available for consultation/collaboration.  I agree with above assessment and plan.  Electronically Signed by: Howard Pouch, DO [facility]

## 2016-09-19 NOTE — Progress Notes (Signed)
Jennifer House , 12/30/1945, 71 y.o., female MRN: [id number] Patient Care Team    Relationship Specialty Notifications Start End  Ma Hillock, DO PCP - General Family Medicine  01/23/15   Thompson Grayer, MD Attending Physician Cardiology  07/25/11   Ronald Lobo, MD Consulting Physician Gastroenterology  08/25/15   Rana Snare, MD Consulting Physician Urology  08/25/15   Renelda Loma, OD  Optometry  08/25/15    Comment: "kimberly" at [facility]    Chief Complaint  Patient presents with  . Hypertension  . Hypothyroidism  . Medicare Wellness    Subjective: Pt presents for an OV for follow-up chronic medical conditions.   Hypertension/hyperlipidemia/hypertriglyceridemia/A. fib: Pt reports compliance with Benzapril 20 mg, amlodipine 10 mg, metoprolol 25 mg twice a day, Lasix 20 mg (MWF). Blood pressures ranges at home within normal limits. Patient denies chest pain, shortness of breath or lower extremity edema. Pt is on warfarin therapy per cardiology. Pt is prescribed Zetia BMP: 08/25/2015 within normal limits CBC: 08/25/2015 within normal limits/stable Lipids: 08/25/2015 total cholesterol 207, LDL 131, HDL 50 triglycerides 124 Diet: Low-sodium Exercise: Tries to exercise  RF: Hypertension, hyperlipidemia, family history of heart disease and stroke, personal history A. Fib, obesity, former smoker  Hypothyroidism due to acquired atrophy of thyroid Last TSH 2.15 06/07/2016, patient reports compliance with levothyroxine 75 g daily.  Recurrent major depressive disorder, in partial remission ([place]) Patient was seen 07/08/2016, restarted Lexapro at that time. Patient reports she is tolerating the medication well. She is also is prescribed trazodone 50-75 milligrams daily at bedtime for insomnia secondary to anxiety.  Vitamin D deficiency Patient has history of osteopenia. She also has history of vitamin D deficiency. She supplements with 1000 units of vitamin D daily. Last DEXA scan  was in 2013. Patient has her Medicare wellness visit today with nurse.   Depression screen [facility] 2/9 07/08/2016 08/25/2015 02/22/2015  Decreased Interest 1 0 0  Down, Depressed, Hopeless 2 0 0  PHQ - 2 Score 3 0 0  Altered sleeping 2 - -  Tired, decreased energy 3 - -  Change in appetite 3 - -  Feeling bad or failure about yourself  0 - -  Trouble concentrating 2 - -  Moving slowly or fidgety/restless 2 - -  Suicidal thoughts 0 - -  PHQ-9 Score 15 - -    Allergies  Allergen Reactions  . Ibandronate Sodium Other (See Comments)    Bones hurt  . Pravachol [Pravastatin Sodium] Other (See Comments)    Myalgias   . Ultram [Tramadol Hcl]     nausea   Social History  Substance Use Topics  . Smoking status: Former Smoker    Quit date: 03/18/1985  . Smokeless tobacco: Never Used     Comment: denies   . Alcohol use No   Past Medical History:  Diagnosis Date  . Anxiety   . Atrial fibrillation ([place])    longstanding persistent  . 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 Risa Grill  . Colon polyps   . Depression   . GERD (gastroesophageal reflux disease)   . Headache(784.0)    hx of migraines  . Hematuria    hx  . HLD (hyperlipidemia)    mixed  . HTN (hypertension)   . Hypothyroidism   . Insomnia   . Liver function test abnormality    hx fatty liver  . Murmur, cardiac   . Nonalcoholic fatty liver disease   .  Obesity   . Osteoarthritis    low back pain with left sciatica  . Osteopenia    dexa 2013  . Renal calculus    hx  . Restless leg syndrome    Past Surgical History:  Procedure Laterality Date  . APPENDECTOMY    . CARDIAC CATHETERIZATION  1/12   revealed normal cors and LV function  . COLONOSCOPY    . cystocopy  10/17/03   left stent placement, TURBT  . FOOT SURGERY    . LUMBAR LAMINECTOMY/DECOMPRESSION MICRODISCECTOMY N/A 01/19/2013   Procedure: LUMBAR THREE FOUR LUMBAR LAMINECTOMY/DECOMPRESSION MICRODISCECTOMY 1 LEVEL;  Surgeon: Faythe Ghee, MD;   Location: [facility];  Service: Neurosurgery;  Laterality: N/A;  . TOTAL ABDOMINAL HYSTERECTOMY  1985   Family History  Problem Relation Age of Onset  . Heart failure Mother   . Heart attack Mother   . Hypertension Mother   . Thyroid cancer Mother   . Stroke Father   . Hypertension Father   . Stroke Brother   . Breast cancer Neg Hx   . Colon cancer Neg Hx    Allergies as of 09/19/2016      Reactions   Ibandronate Sodium Other (See Comments)   Bones hurt   Pravachol [pravastatin Sodium] Other (See Comments)   Myalgias   Ultram [tramadol Hcl]    nausea      Medication List       Accurate as of 09/19/16  9:31 AM. Always use your most recent med list.          amLODipine 10 MG tablet Commonly known as:  NORVASC TAKE 1 TABLET BY MOUTH EVERY DAY   benazepril 20 MG tablet Commonly known as:  LOTENSIN TAKE 1 TABLET BY MOUTH EVERY DAY   CALTRATE 600 1500 (600 Ca) MG Tabs tablet Generic drug:  calcium carbonate Take 3 tablets by mouth daily.   CENTRUM SILVER tablet Take 1 tablet by mouth daily.   cholecalciferol 1000 units tablet Commonly known as:  VITAMIN D Take 1,000 Units by mouth daily.   escitalopram 20 MG tablet Commonly known as:  LEXAPRO 1/2 tab daily for 1 week, then increase to 1 tab daily.   ezetimibe 10 MG tablet Commonly known as:  ZETIA TAKE 1 TABLET BY MOUTH DAILY   furosemide 20 MG tablet Commonly known as:  LASIX TAKE 1 TABLET BY MOUTH EVERY MONDAY,WEDNESDAY AND FRIDAY   levothyroxine 75 MCG tablet Commonly known as:  SYNTHROID Take 1 tablet (75 mcg total) by mouth daily.   metoprolol tartrate 50 MG tablet Commonly known as:  LOPRESSOR Take 0.5 tablets (25 mg total) by mouth 2 (two) times daily.   nitroGLYCERIN 0.4 MG SL tablet Commonly known as:  NITROSTAT PLACE 1 TABLET UNDER THE TONGUE EVERY 5 MINUTES AS NEEDED FOR CHEST PAIN,(MAX 3 TABLETS)   traZODone 50 MG tablet Commonly known as:  DESYREL TAKE 1 TO 2 TABLETS(50 TO 100 MG) BY  MOUTH AT BEDTIME AS NEEDED FOR SLEEP   warfarin 5 MG tablet Commonly known as:  COUMADIN TAKE AS DIRECTED       No results found for this or any previous visit (from the past 24 hour(s)). No results found.   ROS: Negative, with the exception of above mentioned in HPI   Objective:  BP 126/71 (BP Location: Right Arm, Patient Position: Sitting, Cuff Size: Large)   Pulse 61   Temp 98.1 F (36.7 C)   Resp 20   Ht 5\' 5"  (1.651  m)   Wt 195 lb (88.5 kg)   SpO2 98%   BMI 32.45 kg/m  Body mass index is 32.45 kg/m. Gen: Afebrile. No acute distress. Nontoxic in appearance, well developed, well nourished.  HENT: AT. . MMM Eyes:Pupils Equal Round Reactive to light, Extraocular movements intact,  Conjunctiva without redness, discharge or icterus. CV: RRR , no edema Chest: CTAB, no wheeze or crackles. Good air movement, normal resp effort.   Assessment/Plan: Jennifer House is a 71 y.o. female present for  OV for  Essential hypertension, benign/hyperlipidemia/A. fib - BP stable today. - low salt diet, increase exercise.  - CBC, BMP, lipid panel and A1c collected today. - Refills provided on metoprolol, Benzapril, amlodipine, Lasix and Zetia for 6 months.  Hypothyroidism due to acquired atrophy of thyroid - Continue levothyroxine 75 g daily, patient is not in need of refills today. Will follow every 6 months with chronic medical conditions. - TSH will be due next visit.  Osteopenia, unspecified location/vitamin D deficiency - Continue supplementation 1000 units of vitamin D daily. - Vitamin D level will be checked today. - If she is due for her repeat DEXA scan, it will be ordered on her Medicare wellness.  Recurrent major depressive disorder, in partial remission (HCC)/insomnia - Patient is doing well on Lexapro 20 mg daily. Trazodone 50-75 milligrams daily at bedtime. She is in need of refills on both of these medications today. Refills provided for 6 months. - Follow-up in  6 months.   electronically signed by:  Howard Pouch, DO  [place]

## 2016-09-19 NOTE — Patient Instructions (Addendum)
It was a pleasure to see you today.  Glad you are doing so well.  You look great, love the purple.  - Dr.K  F/U 6 months.   Bring a copy of your advance directives to your next office visit.  Continue doing brain stimulating activities (puzzles, reading, adult coloring books, staying active) to keep memory sharp.   Shingrix Vaccine  Health Maintenance, Female Adopting a healthy lifestyle and getting preventive care can go a long way to promote health and wellness. Talk with your health care provider about what schedule of regular examinations is right for you. This is a good chance for you to check in with your provider about disease prevention and staying healthy. In between checkups, there are plenty of things you can do on your own. Experts have done a lot of research about which lifestyle changes and preventive measures are most likely to keep you healthy. Ask your health care provider for more information. Weight and diet Eat a healthy diet  Be sure to include plenty of vegetables, fruits, low-fat dairy products, and lean protein.  Do not eat a lot of foods high in solid fats, added sugars, or salt.  Get regular exercise. This is one of the most important things you can do for your health. ? Most adults should exercise for at least 150 minutes each week. The exercise should increase your heart rate and make you sweat (moderate-intensity exercise). ? Most adults should also do strengthening exercises at least twice a week. This is in addition to the moderate-intensity exercise.  Maintain a healthy weight  Body mass index (BMI) is a measurement that can be used to identify possible weight problems. It estimates body fat based on height and weight. Your health care provider can help determine your BMI and help you achieve or maintain a healthy weight.  For females 8 years of age and older: ? A BMI below 18.5 is considered underweight. ? A BMI of 18.5 to 24.9 is normal. ? A BMI of  25 to 29.9 is considered overweight. ? A BMI of 30 and above is considered obese.  Watch levels of cholesterol and blood lipids  You should start having your blood tested for lipids and cholesterol at 73 years of age, then have this test every 5 years.  You may need to have your cholesterol levels checked more often if: ? Your lipid or cholesterol levels are high. ? You are older than 73 years of age. ? You are at high risk for heart disease.  Cancer screening Lung Cancer  Lung cancer screening is recommended for adults 19-88 years old who are at high risk for lung cancer because of a history of smoking.  A yearly low-dose CT scan of the lungs is recommended for people who: ? Currently smoke. ? Have quit within the past 15 years. ? Have at least a 30-pack-year history of smoking. A pack year is smoking an average of one pack of cigarettes a day for 1 year.  Yearly screening should continue until it has been 15 years since you quit.  Yearly screening should stop if you develop a health problem that would prevent you from having lung cancer treatment.  Breast Cancer  Practice breast self-awareness. This means understanding how your breasts normally appear and feel.  It also means doing regular breast self-exams. Let your health care provider know about any changes, no matter how small.  If you are in your 20s or 30s, you should have a clinical  breast exam (CBE) by a health care provider every 1-3 years as part of a regular health exam.  If you are 39 or older, have a CBE every year. Also consider having a breast X-ray (mammogram) every year.  If you have a family history of breast cancer, talk to your health care provider about genetic screening.  If you are at high risk for breast cancer, talk to your health care provider about having an MRI and a mammogram every year.  Breast cancer gene (BRCA) assessment is recommended for women who have family members with BRCA-related  cancers. BRCA-related cancers include: ? Breast. ? Ovarian. ? Tubal. ? Peritoneal cancers.  Results of the assessment will determine the need for genetic counseling and BRCA1 and BRCA2 testing.  Cervical Cancer Your health care provider may recommend that you be screened regularly for cancer of the pelvic organs (ovaries, uterus, and vagina). This screening involves a pelvic examination, including checking for microscopic changes to the surface of your cervix (Pap test). You may be encouraged to have this screening done every 3 years, beginning at age 71.  For women ages 46-65, health care providers may recommend pelvic exams and Pap testing every 3 years, or they may recommend the Pap and pelvic exam, combined with testing for human papilloma virus (HPV), every 5 years. Some types of HPV increase your risk of cervical cancer. Testing for HPV may also be done on women of any age with unclear Pap test results.  Other health care providers may not recommend any screening for nonpregnant women who are considered low risk for pelvic cancer and who do not have symptoms. Ask your health care provider if a screening pelvic exam is right for you.  If you have had past treatment for cervical cancer or a condition that could lead to cancer, you need Pap tests and screening for cancer for at least 20 years after your treatment. If Pap tests have been discontinued, your risk factors (such as having a new sexual partner) need to be reassessed to determine if screening should resume. Some women have medical problems that increase the chance of getting cervical cancer. In these cases, your health care provider may recommend more frequent screening and Pap tests.  Colorectal Cancer  This type of cancer can be detected and often prevented.  Routine colorectal cancer screening usually begins at 73 years of age and continues through 73 years of age.  Your health care provider may recommend screening at an  earlier age if you have risk factors for colon cancer.  Your health care provider may also recommend using home test kits to check for hidden blood in the stool.  A small camera at the end of a tube can be used to examine your colon directly (sigmoidoscopy or colonoscopy). This is done to check for the earliest forms of colorectal cancer.  Routine screening usually begins at age 55.  Direct examination of the colon should be repeated every 5-10 years through 73 years of age. However, you may need to be screened more often if early forms of precancerous polyps or small growths are found.  Skin Cancer  Check your skin from head to toe regularly.  Tell your health care provider about any new moles or changes in moles, especially if there is a change in a mole's shape or color.  Also tell your health care provider if you have a mole that is larger than the size of a pencil eraser.  Always use sunscreen. Apply  sunscreen liberally and repeatedly throughout the day.  Protect yourself by wearing long sleeves, pants, a wide-brimmed hat, and sunglasses whenever you are outside.  Heart disease, diabetes, and high blood pressure  High blood pressure causes heart disease and increases the risk of stroke. High blood pressure is more likely to develop in: ? People who have blood pressure in the high end of the normal range (130-139/85-89 mm Hg). ? People who are overweight or obese. ? People who are African American.  If you are 19-67 years of age, have your blood pressure checked every 3-5 years. If you are 89 years of age or older, have your blood pressure checked every year. You should have your blood pressure measured twice-once when you are at a hospital or clinic, and once when you are not at a hospital or clinic. Record the average of the two measurements. To check your blood pressure when you are not at a hospital or clinic, you can use: ? An automated blood pressure machine at a  pharmacy. ? A home blood pressure monitor.  If you are between 71 years and 66 years old, ask your health care provider if you should take aspirin to prevent strokes.  Have regular diabetes screenings. This involves taking a blood sample to check your fasting blood sugar level. ? If you are at a normal weight and have a low risk for diabetes, have this test once every three years after 73 years of age. ? If you are overweight and have a high risk for diabetes, consider being tested at a younger age or more often. Preventing infection Hepatitis B  If you have a higher risk for hepatitis B, you should be screened for this virus. You are considered at high risk for hepatitis B if: ? You were born in a country where hepatitis B is common. Ask your health care provider which countries are considered high risk. ? Your parents were born in a high-risk country, and you have not been immunized against hepatitis B (hepatitis B vaccine). ? You have HIV or AIDS. ? You use needles to inject street drugs. ? You live with someone who has hepatitis B. ? You have had sex with someone who has hepatitis B. ? You get hemodialysis treatment. ? You take certain medicines for conditions, including cancer, organ transplantation, and autoimmune conditions.  Hepatitis C  Blood testing is recommended for: ? Everyone born from [birth year] through 1967. ? Anyone with known risk factors for hepatitis C.  Sexually transmitted infections (STIs)  You should be screened for sexually transmitted infections (STIs) including gonorrhea and chlamydia if: ? You are sexually active and are younger than 73 years of age. ? You are older than 73 years of age and your health care provider tells you that you are at risk for this type of infection. ? Your sexual activity has changed since you were last screened and you are at an increased risk for chlamydia or gonorrhea. Ask your health care provider if you are at risk.  If you do not  have HIV, but are at risk, it may be recommended that you take a prescription medicine daily to prevent HIV infection. This is called pre-exposure prophylaxis (PrEP). You are considered at risk if: ? You are sexually active and do not regularly use condoms or know the HIV status of your partner(s). ? You take drugs by injection. ? You are sexually active with a partner who has HIV.  Talk with your health care provider  about whether you are at high risk of being infected with HIV. If you choose to begin PrEP, you should first be tested for HIV. You should then be tested every 3 months for as long as you are taking PrEP. Pregnancy  If you are premenopausal and you may become pregnant, ask your health care provider about preconception counseling.  If you may become pregnant, take 400 to 800 micrograms (mcg) of folic acid every day.  If you want to prevent pregnancy, talk to your health care provider about birth control (contraception). Osteoporosis and menopause  Osteoporosis is a disease in which the bones lose minerals and strength with aging. This can result in serious bone fractures. Your risk for osteoporosis can be identified using a bone density scan.  If you are 51 years of age or older, or if you are at risk for osteoporosis and fractures, ask your health care provider if you should be screened.  Ask your health care provider whether you should take a calcium or vitamin D supplement to lower your risk for osteoporosis.  Menopause may have certain physical symptoms and risks.  Hormone replacement therapy may reduce some of these symptoms and risks. Talk to your health care provider about whether hormone replacement therapy is right for you. Follow these instructions at home:  Schedule regular health, dental, and eye exams.  Stay current with your immunizations.  Do not use any tobacco products including cigarettes, chewing tobacco, or electronic cigarettes.  If you are pregnant,  do not drink alcohol.  If you are breastfeeding, limit how much and how often you drink alcohol.  Limit alcohol intake to no more than 1 drink per day for nonpregnant women. One drink equals 12 ounces of beer, 5 ounces of wine, or 1 ounces of hard liquor.  Do not use street drugs.  Do not share needles.  Ask your health care provider for help if you need support or information about quitting drugs.  Tell your health care provider if you often feel depressed.  Tell your health care provider if you have ever been abused or do not feel safe at home. This information is not intended to replace advice given to you by your health care provider. Make sure you discuss any questions you have with your health care provider. Document Released: 09/17/2010 Document Revised: 08/10/2015 Document Reviewed: 12/06/2014 Elsevier Interactive Patient Education  [employer].

## 2016-09-20 ENCOUNTER — Telehealth: Payer: Self-pay | Admitting: Family Medicine

## 2016-09-20 LAB — HEMOGLOBIN A1C
Hgb A1c MFr Bld: 5.4 % (ref <5.7)
Mean Plasma Glucose: 108 mg/dL

## 2016-09-20 NOTE — Telephone Encounter (Signed)
Please call pt: - her labs are stable from prior collections. Except her cholesterol is a little bit higher. Recommend higher fiber diet and exercise.  Lipid Panel     Component Value Date/Time   CHOL 224 (H) 09/19/2016 1009   TRIG 163.0 (H) 09/19/2016 1009   HDL 55.60 09/19/2016 1009   CHOLHDL 4 09/19/2016 1009   VLDL 32.6 09/19/2016 1009   LDLCALC 135 (H) 09/19/2016 1009

## 2016-09-20 NOTE — Telephone Encounter (Signed)
Spoke with patient reviewed lab results and instructions. Patient verbalized understanding. 

## 2016-09-23 ENCOUNTER — Ambulatory Visit (INDEPENDENT_AMBULATORY_CARE_PROVIDER_SITE_OTHER): Payer: Medicare Other | Admitting: Podiatry

## 2016-09-23 ENCOUNTER — Encounter: Payer: Self-pay | Admitting: Podiatry

## 2016-09-23 DIAGNOSIS — L601 Onycholysis: Secondary | ICD-10-CM

## 2016-09-26 ENCOUNTER — Other Ambulatory Visit: Payer: Self-pay | Admitting: Family Medicine

## 2016-09-30 NOTE — Progress Notes (Signed)
   Subjective: Patient presents today for follow-up evaluation of dystrophic nails to the first and second toes of bilateral feet. She states her condition has improved. She states the soreness in the great toes has resolved. She denies any new complaints at this time.  Objective: Physical Exam General: The patient is alert and oriented x3 in no acute distress.  Dermatology: Hyperkeratotic, discolored, thickened, onychodystrophy of nails noted bilaterally.  Skin is warm, dry and supple bilateral lower extremities. Negative for open lesions or macerations.  Vascular: Palpable pedal pulses bilaterally. No edema or erythema noted. Capillary refill within normal limits.  Neurological: Epicritic and protective threshold grossly intact bilaterally.   Musculoskeletal Exam: Range of motion within normal limits to all pedal and ankle joints bilateral. Muscle strength 5/5 in all groups bilateral.   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 #2 Mechanical debridement of nails 1-2 bilaterally performed using a nail nipper. Filed with dremel without incident.  #3 Return to clinic when necessary.   Edrick Kins, DPM [facility]  Dr. Edrick Kins, [address]                                        [place], [place] [address]                Office [phone]  Fax [phone]

## 2016-10-02 ENCOUNTER — Encounter: Payer: Self-pay | Admitting: *Deleted

## 2016-10-02 ENCOUNTER — Telehealth: Payer: Self-pay | Admitting: *Deleted

## 2016-10-02 NOTE — Telephone Encounter (Signed)
Message sent in My Chart to call and schedule Medicare Wellness and CPE

## 2016-10-04 ENCOUNTER — Ambulatory Visit (INDEPENDENT_AMBULATORY_CARE_PROVIDER_SITE_OTHER): Payer: Medicare Other | Admitting: *Deleted

## 2016-10-04 DIAGNOSIS — I4891 Unspecified atrial fibrillation: Secondary | ICD-10-CM

## 2016-10-04 DIAGNOSIS — Z7901 Long term (current) use of anticoagulants: Secondary | ICD-10-CM

## 2016-10-04 LAB — POCT INR: INR: 2.9

## 2016-10-13 ENCOUNTER — Other Ambulatory Visit: Payer: Self-pay | Admitting: Family Medicine

## 2016-10-13 DIAGNOSIS — I10 Essential (primary) hypertension: Secondary | ICD-10-CM

## 2016-10-16 ENCOUNTER — Other Ambulatory Visit: Payer: Self-pay | Admitting: Family Medicine

## 2016-10-16 DIAGNOSIS — Z1231 Encounter for screening mammogram for malignant neoplasm of breast: Secondary | ICD-10-CM

## 2016-10-25 ENCOUNTER — Ambulatory Visit
Admission: RE | Admit: 2016-10-25 | Discharge: 2016-10-25 | Disposition: A | Discharge status: Home / Self Care | Payer: Medicare Other | Source: Ambulatory Visit | Attending: Family Medicine | Admitting: Family Medicine

## 2016-10-25 DIAGNOSIS — Z1231 Encounter for screening mammogram for malignant neoplasm of breast: Secondary | ICD-10-CM | POA: Diagnosis not present

## 2016-11-11 ENCOUNTER — Other Ambulatory Visit: Payer: Self-pay | Admitting: Internal Medicine

## 2016-11-15 ENCOUNTER — Ambulatory Visit (INDEPENDENT_AMBULATORY_CARE_PROVIDER_SITE_OTHER): Payer: Medicare Other

## 2016-11-15 DIAGNOSIS — Z7901 Long term (current) use of anticoagulants: Secondary | ICD-10-CM

## 2016-11-15 DIAGNOSIS — I4891 Unspecified atrial fibrillation: Secondary | ICD-10-CM | POA: Diagnosis not present

## 2016-11-15 LAB — POCT INR: INR: 2.9

## 2016-12-05 ENCOUNTER — Other Ambulatory Visit: Payer: Self-pay | Admitting: Family Medicine

## 2016-12-05 DIAGNOSIS — I10 Essential (primary) hypertension: Secondary | ICD-10-CM

## 2016-12-27 ENCOUNTER — Ambulatory Visit (INDEPENDENT_AMBULATORY_CARE_PROVIDER_SITE_OTHER): Payer: Medicare Other | Admitting: *Deleted

## 2016-12-27 DIAGNOSIS — I4891 Unspecified atrial fibrillation: Secondary | ICD-10-CM

## 2016-12-27 DIAGNOSIS — Z7901 Long term (current) use of anticoagulants: Secondary | ICD-10-CM

## 2016-12-27 DIAGNOSIS — Z5181 Encounter for therapeutic drug level monitoring: Secondary | ICD-10-CM

## 2016-12-27 LAB — POCT INR: INR: 3.5

## 2017-01-02 ENCOUNTER — Ambulatory Visit (INDEPENDENT_AMBULATORY_CARE_PROVIDER_SITE_OTHER): Payer: Medicare Other

## 2017-01-02 DIAGNOSIS — Z23 Encounter for immunization: Secondary | ICD-10-CM | POA: Diagnosis not present

## 2017-01-24 ENCOUNTER — Ambulatory Visit (INDEPENDENT_AMBULATORY_CARE_PROVIDER_SITE_OTHER): Payer: Medicare Other | Admitting: *Deleted

## 2017-01-24 DIAGNOSIS — I4891 Unspecified atrial fibrillation: Secondary | ICD-10-CM | POA: Diagnosis not present

## 2017-01-24 DIAGNOSIS — Z7901 Long term (current) use of anticoagulants: Secondary | ICD-10-CM

## 2017-01-24 LAB — POCT INR: INR: 3.8

## 2017-01-24 NOTE — Progress Notes (Signed)
01/24/17-INR 3.8; Coumadin dosing instructions: Do not take today's dose then start taking  1 tablet every day except 1.5 tablets on Sundays and Thursdays.  Recheck INR in 2 weeks

## 2017-02-05 ENCOUNTER — Ambulatory Visit (INDEPENDENT_AMBULATORY_CARE_PROVIDER_SITE_OTHER): Payer: Medicare Other | Admitting: *Deleted

## 2017-02-05 DIAGNOSIS — I4891 Unspecified atrial fibrillation: Secondary | ICD-10-CM

## 2017-02-05 DIAGNOSIS — Z7901 Long term (current) use of anticoagulants: Secondary | ICD-10-CM | POA: Diagnosis not present

## 2017-02-05 LAB — POCT INR: INR: 3

## 2017-02-05 NOTE — Patient Instructions (Signed)
Continue taking 1 tablet every day except 1.5 tablets on Sundays and Thursdays.  Recheck INR in 3 weeks. # [phone] Coumadin clinic. Main # [phone].

## 2017-02-12 ENCOUNTER — Other Ambulatory Visit: Payer: Self-pay | Admitting: Family Medicine

## 2017-02-12 DIAGNOSIS — F331 Major depressive disorder, recurrent, moderate: Secondary | ICD-10-CM

## 2017-02-12 DIAGNOSIS — I10 Essential (primary) hypertension: Secondary | ICD-10-CM

## 2017-02-20 ENCOUNTER — Other Ambulatory Visit: Payer: Self-pay | Admitting: Family Medicine

## 2017-02-20 DIAGNOSIS — I10 Essential (primary) hypertension: Secondary | ICD-10-CM

## 2017-02-28 ENCOUNTER — Ambulatory Visit (INDEPENDENT_AMBULATORY_CARE_PROVIDER_SITE_OTHER): Payer: Medicare Other | Admitting: *Deleted

## 2017-02-28 DIAGNOSIS — Z7901 Long term (current) use of anticoagulants: Secondary | ICD-10-CM | POA: Diagnosis not present

## 2017-02-28 DIAGNOSIS — I4891 Unspecified atrial fibrillation: Secondary | ICD-10-CM

## 2017-02-28 LAB — POCT INR: INR: 2.3

## 2017-02-28 NOTE — Patient Instructions (Signed)
Description   Continue taking 1 tablet every day except 1.5 tablets on Sundays and Thursdays.  Recheck INR in 4 weeks. # [phone] Coumadin clinic. Main # [phone].

## 2017-03-03 ENCOUNTER — Other Ambulatory Visit: Payer: Self-pay | Admitting: Internal Medicine

## 2017-03-26 ENCOUNTER — Encounter: Payer: Self-pay | Admitting: Family Medicine

## 2017-03-26 ENCOUNTER — Ambulatory Visit (INDEPENDENT_AMBULATORY_CARE_PROVIDER_SITE_OTHER): Payer: Medicare Other | Admitting: Family Medicine

## 2017-03-26 VITALS — BP 110/70 | HR 66 | Temp 97.3°F | Wt 201.8 lb

## 2017-03-26 DIAGNOSIS — I10 Essential (primary) hypertension: Secondary | ICD-10-CM

## 2017-03-26 DIAGNOSIS — E781 Pure hyperglyceridemia: Secondary | ICD-10-CM | POA: Diagnosis not present

## 2017-03-26 DIAGNOSIS — F3341 Major depressive disorder, recurrent, in partial remission: Secondary | ICD-10-CM

## 2017-03-26 DIAGNOSIS — I4891 Unspecified atrial fibrillation: Secondary | ICD-10-CM | POA: Diagnosis not present

## 2017-03-26 DIAGNOSIS — E785 Hyperlipidemia, unspecified: Secondary | ICD-10-CM | POA: Diagnosis not present

## 2017-03-26 DIAGNOSIS — G4700 Insomnia, unspecified: Secondary | ICD-10-CM | POA: Diagnosis not present

## 2017-03-26 DIAGNOSIS — F331 Major depressive disorder, recurrent, moderate: Secondary | ICD-10-CM | POA: Diagnosis not present

## 2017-03-26 DIAGNOSIS — Z7901 Long term (current) use of anticoagulants: Secondary | ICD-10-CM

## 2017-03-26 DIAGNOSIS — E034 Atrophy of thyroid (acquired): Secondary | ICD-10-CM | POA: Diagnosis not present

## 2017-03-26 MED ORDER — LEVOTHYROXINE SODIUM 75 MCG PO TABS: 75.0000 ug | ORAL_TABLET | Freq: Every day | ORAL | 1 refills | Status: DC

## 2017-03-26 MED ORDER — BENAZEPRIL HCL 20 MG PO TABS: 20.0000 mg | ORAL_TABLET | Freq: Every day | ORAL | 1 refills | Status: DC

## 2017-03-26 MED ORDER — AMLODIPINE BESYLATE 10 MG PO TABS: 10.0000 mg | ORAL_TABLET | Freq: Every day | ORAL | 1 refills | Status: DC

## 2017-03-26 MED ORDER — TRAZODONE HCL 50 MG PO TABS: ORAL_TABLET | ORAL | 1 refills | Status: DC

## 2017-03-26 MED ORDER — ESCITALOPRAM OXALATE 20 MG PO TABS: 20.0000 mg | ORAL_TABLET | Freq: Every day | ORAL | 1 refills | Status: DC

## 2017-03-26 MED ORDER — EZETIMIBE 10 MG PO TABS: 10.0000 mg | ORAL_TABLET | Freq: Every day | ORAL | 1 refills | Status: DC

## 2017-03-26 MED ORDER — METOPROLOL TARTRATE 50 MG PO TABS: 25.0000 mg | ORAL_TABLET | Freq: Two times a day (BID) | ORAL | 1 refills | Status: DC

## 2017-03-26 NOTE — Progress Notes (Signed)
Jennifer House , March 10, 1946, 72 y.o., female MRN: [id number] Patient Care Team    Relationship Specialty Notifications Start End  Ma Hillock, DO PCP - General Family Medicine  01/23/15   Thompson Grayer, MD Attending Physician Cardiology  07/25/11   Ronald Lobo, MD Consulting Physician Gastroenterology  08/25/15   Rana Snare, MD Consulting Physician Urology  08/25/15   Renelda Loma, OD  Optometry  08/25/15    Comment: "kimberly" at [facility]    Chief Complaint  Patient presents with  . Follow-up    pts is here for 6 months follow up for chronic medical conditions    Subjective: Pt presents for an OV for follow-up chronic medical conditions.   Hypertension/hyperlipidemia/hypertriglyceridemia/A. fib: Pt reports compliance with Benzapril 20 mg, amlodipine 10 mg, metoprolol 25 mg twice a day, Lasix 20 mg (MWF) today. Blood pressures ranges at home within normal limits. Patient denies chest pain, shortness of breath, dizziness or lower extremity edema.  Pt is on warfarin therapy per cardiology. Pt is prescribed Zetia BMP: 09/19/2016 within normal limits CBC: 09/19/2016 within normal limits Lipids: 09/19/2016 total cholesterol 224, HDL 55, LDL 135, triglycerides 163 Diet: Low-sodium Exercise: Tries to exercise  RF: Hypertension, hyperlipidemia, family history of heart disease and stroke, personal history A. Fib, obesity, former smoker  Hypothyroidism due to acquired atrophy of thyroid Last TSH 2.15 06/07/2016, patient reports compliance with levothyroxine 75 g daily.  Recurrent major depressive disorder, in partial remission [facility]) Patient reports she is doing well on Lexapro 20 mg a day. Feels much better and she restarted that medication. Patient reports she is tolerating the medication well. She is also is prescribed trazodone 50-75 milligrams daily at bedtime for insomnia secondary to anxiety.   Depression screen [facility] 2/9 03/26/2017 09/19/2016 09/19/2016 07/08/2016 08/25/2015    Decreased Interest 0 0 0 1 0  Down, Depressed, Hopeless 0 0 0 2 0  PHQ - 2 Score 0 0 0 3 0  Altered sleeping 0 - - 2 -  Tired, decreased energy 3 - - 3 -  Change in appetite 0 - - 3 -  Feeling bad or failure about yourself  0 - - 0 -  Trouble concentrating 0 - - 2 -  Moving slowly or fidgety/restless 3 - - 2 -  Suicidal thoughts 0 - - 0 -  PHQ-9 Score 6 - - 15 -  Difficult doing work/chores Not difficult at all - - - -    Allergies  Allergen Reactions  . Ibandronate Sodium Other (See Comments)    Bones hurt  . Pravachol [Pravastatin Sodium] Other (See Comments)    Myalgias   . Ultram [Tramadol Hcl]     nausea   Social History   Tobacco Use  . Smoking status: Former Smoker    Last attempt to quit: 03/18/1985    Years since quitting: 32.0  . Smokeless tobacco: Never Used  . Tobacco comment: denies   Substance Use Topics  . Alcohol use: No   Past Medical History:  Diagnosis Date  . Anxiety   . Atrial fibrillation ([place])    longstanding persistent  . 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 Risa Grill  . Colon polyps   . Depression   . GERD (gastroesophageal reflux disease)   . Headache(784.0)    hx of migraines  . Hematuria    hx  . HLD (hyperlipidemia)    mixed  . HTN (hypertension)   . Hypothyroidism   .  Insomnia   . Liver function test abnormality    hx fatty liver  . Murmur, cardiac   . Nonalcoholic fatty liver disease   . Obesity   . Osteoarthritis    low back pain with left sciatica  . Osteopenia    dexa 2013  . Renal calculus    hx  . Restless leg syndrome    Past Surgical History:  Procedure Laterality Date  . APPENDECTOMY    . CARDIAC CATHETERIZATION  1/12   revealed normal cors and LV function  . COLONOSCOPY    . cystocopy  10/17/03   left stent placement, TURBT  . FOOT SURGERY    . LUMBAR LAMINECTOMY/DECOMPRESSION MICRODISCECTOMY N/A 01/19/2013   Procedure: LUMBAR THREE FOUR LUMBAR LAMINECTOMY/DECOMPRESSION MICRODISCECTOMY 1  LEVEL;  Surgeon: Faythe Ghee, MD;  Location: [facility];  Service: Neurosurgery;  Laterality: N/A;  . TOTAL ABDOMINAL HYSTERECTOMY  1985   Family History  Problem Relation Age of Onset  . Heart failure Mother   . Heart attack Mother   . Hypertension Mother   . Thyroid cancer Mother   . Stroke Father   . Hypertension Father   . Stroke Brother   . Breast cancer Neg Hx   . Colon cancer Neg Hx    Allergies as of 03/26/2017      Reactions   Ibandronate Sodium Other (See Comments)   Bones hurt   Pravachol [pravastatin Sodium] Other (See Comments)   Myalgias   Ultram [tramadol Hcl]    nausea      Medication List        Accurate as of 03/26/17  4:35 PM. Always use your most recent med list.          amLODipine 10 MG tablet Commonly known as:  NORVASC Take 1 tablet (10 mg total) by mouth daily.   amLODipine 10 MG tablet Commonly known as:  NORVASC Take 1 tablet (10 mg total) by mouth daily.   benazepril 20 MG tablet Commonly known as:  LOTENSIN Take 1 tablet (20 mg total) by mouth daily. Needs office visit prior to anymore refills.   CALTRATE 600 1500 (600 Ca) MG Tabs tablet Generic drug:  calcium carbonate Take 3 tablets by mouth daily.   CENTRUM SILVER tablet Take 1 tablet by mouth daily.   cholecalciferol 1000 units tablet Commonly known as:  VITAMIN D Take 1,000 Units by mouth daily.   escitalopram 20 MG tablet Commonly known as:  LEXAPRO Take 1 tablet (20 mg total) by mouth daily.   ezetimibe 10 MG tablet Commonly known as:  ZETIA Take 1 tablet (10 mg total) by mouth daily.   furosemide 20 MG tablet Commonly known as:  LASIX TAKE 1 TABLET BY MOUTH EVERY MONDAY, WEDNESDAY AND FRIDAY   levothyroxine 75 MCG tablet Commonly known as:  SYNTHROID, LEVOTHROID Take 1 tablet (75 mcg total) by mouth daily.   metoprolol tartrate 50 MG tablet Commonly known as:  LOPRESSOR Take 0.5 tablets (25 mg total) by mouth 2 (two) times daily.   nitroGLYCERIN 0.4 MG  SL tablet Commonly known as:  NITROSTAT PLACE 1 TABLET UNDER THE TONGUE EVERY 5 MINUTES AS NEEDED FOR CHEST PAIN,(MAX 3 TABLETS)   traZODone 50 MG tablet Commonly known as:  DESYREL TAKE 1 TO 2 TABLETS(50 TO 100 MG) BY MOUTH AT BEDTIME AS NEEDED FOR SLEEP   warfarin 5 MG tablet Commonly known as:  COUMADIN Take as directed by the anticoagulation clinic. If you are unsure how to  take this medication, talk to your nurse or doctor. Original instructions:  TAKE AS DIRECTED BY COUMADIN CLINIC       No results found for this or any previous visit (from the past 24 hour(s)). No results found.   ROS: Negative, with the exception of above mentioned in HPI   Objective:  BP 110/70 (BP Location: Right Arm, Cuff Size: Large)   Pulse 66   Temp (!) 97.3 F (36.3 C) (Oral)   Wt 201 lb 12.8 oz (91.5 kg)   SpO2 96%   BMI 33.58 kg/m  Body mass index is 33.58 kg/m. Gen: Afebrile. No acute distress. Nontoxic in appearance, well-developed, well-nourished, Caucasian female. HENT: AT. [place].  MMM.  Eyes:Pupils Equal Round Reactive to light, Extraocular movements intact,  Conjunctiva without redness, discharge or icterus. Neck/lymp/endocrine: Supple, no thyromegaly CV: Irregularly irregular, no murmur/click/gallop or rub, no edema, +2/4 P posterior tibialis pulses Chest: CTAB, no wheeze or crackles Abd: Soft. NTND. BS present.  Skin: no rashes, purpura or petechiae.  Neuro: Normal gait. PERLA. EOMi. Alert. Oriented x3  Psych: Normal affect, dress and demeanor. Normal speech. Normal thought content and judgment.  Assessment/Plan: SHANTA HARTNER is a 72 y.o. female present for  OV for  Essential hypertension, benign/hyperlipidemia/A. Fib/morbid obesity - BP stable today. Continue current regimen. - Continue follow-up with cardiology for atrial fibrillation - low salt diet, increase exercise.  - Continue Coumadin per Coumadin Clinic - Labs will be due next visit. - Refills provided on  metoprolol, Benzapril, amlodipine, Lasix and Zetia for 6 months.  Hypothyroidism due to acquired atrophy of thyroid - Continue levothyroxine 75 g daily - TSH will be due next visit   Recurrent major depressive disorder, in partial remission (HCC)/insomnia - Stable today. -Continue Lexapro 20 mg daily, refills provided today for 6 months. -Continue Trazodone 50-75 mg daily at bedtime when necessary. Refills provided today for 6 months. -Follow-up in 6 months   *Patient complained of mild left-sided thoracic back pain that worsened with movement and deep breaths at the end of her visit, no other acute cardiac or pulmonary symptoms. Advised her to use Tylenol, heating pad and stretches. If worsens or does not resolve within 1-2 weeks, patient is to make an appointment to address her back pain. There is not enough time in today's visit to add on an acute/same day visit. Patient understood, and agreed to make appointment if needed.   electronically signed by:  Howard Pouch, DO  [place]

## 2017-03-26 NOTE — Patient Instructions (Signed)
It was nice to see you today.  I have refilled your medications. Follow up in 6 months and we will also do fasting labs that day.   You look great.

## 2017-03-27 ENCOUNTER — Other Ambulatory Visit: Payer: Self-pay | Admitting: Family Medicine

## 2017-03-27 DIAGNOSIS — F331 Major depressive disorder, recurrent, moderate: Secondary | ICD-10-CM

## 2017-03-28 ENCOUNTER — Ambulatory Visit (INDEPENDENT_AMBULATORY_CARE_PROVIDER_SITE_OTHER): Payer: Medicare Other | Admitting: Pharmacist

## 2017-03-28 DIAGNOSIS — Z7901 Long term (current) use of anticoagulants: Secondary | ICD-10-CM

## 2017-03-28 DIAGNOSIS — I4891 Unspecified atrial fibrillation: Secondary | ICD-10-CM | POA: Diagnosis not present

## 2017-03-28 LAB — POCT INR: INR: 2.3

## 2017-03-28 NOTE — Patient Instructions (Signed)
Description   Continue taking 1 tablet every day except 1.5 tablets on Sundays and Thursdays.  Recheck INR in 6 weeks. # [phone] Coumadin clinic. Main # [phone].

## 2017-04-30 ENCOUNTER — Encounter: Payer: Self-pay | Admitting: Family Medicine

## 2017-04-30 ENCOUNTER — Ambulatory Visit (INDEPENDENT_AMBULATORY_CARE_PROVIDER_SITE_OTHER): Payer: Medicare Other | Admitting: Family Medicine

## 2017-04-30 VITALS — BP 137/77 | HR 70 | Temp 97.5°F | Resp 20 | Wt 197.8 lb

## 2017-04-30 DIAGNOSIS — R21 Rash and other nonspecific skin eruption: Secondary | ICD-10-CM | POA: Diagnosis not present

## 2017-04-30 MED ORDER — FLUOCINOLONE ACETONIDE 0.01 % EX CREA: TOPICAL_CREAM | Freq: Two times a day (BID) | CUTANEOUS | 0 refills | Status: DC

## 2017-04-30 MED ORDER — HYDROXYZINE PAMOATE 25 MG PO CAPS
25.0000 mg | ORAL_CAPSULE | Freq: Every day | ORAL | 0 refills | Status: DC
Start: 2017-04-30 — End: 2017-06-02

## 2017-04-30 MED ORDER — METHYLPREDNISOLONE ACETATE 80 MG/ML IJ SUSP
80.0000 mg | Freq: Once | INTRAMUSCULAR | Status: AC
  Administered 2017-04-30: 80 mg via INTRAMUSCULAR

## 2017-04-30 NOTE — Patient Instructions (Signed)
Hold trazodone if taking vistaril.  Use new cream twice a day over rash area.  If you are not seeing a positive result in 1 week, then I want to see you.

## 2017-04-30 NOTE — Progress Notes (Signed)
Jennifer House , 05/22/1945, 72 y.o., female MRN: [id number] Patient Care Team    Relationship Specialty Notifications Start End  Ma Hillock, DO PCP - General Family Medicine  01/23/15   Thompson Grayer, MD Attending Physician Cardiology  07/25/11   Ronald Lobo, MD Consulting Physician Gastroenterology  08/25/15   Rana Snare, MD Consulting Physician Urology  08/25/15   Renelda Loma, OD  Optometry  08/25/15    Comment: "Jennifer House" at [facility]    Chief Complaint  Patient presents with  . Rash    arms and neck     Subjective: Pt presents for an OV with complaints of itchy rash of one week duration.  Associated symptoms include rashes spreading from hands arms to neck. She denies any new hygiene products, clothing or cleaning agents. She denies any recent travel. She denies any new medications. She has never had a rash similar. She reports that the itching so much she is taking into her skin and bruising herself. He has tried cortisone cream, Benadryl and baby oil without any success.  Depression screen [facility] 2/9 04/30/2017 03/26/2017 09/19/2016 09/19/2016 07/08/2016  Decreased Interest 0 0 0 0 1  Down, Depressed, Hopeless 0 0 0 0 2  PHQ - 2 Score 0 0 0 0 3  Altered sleeping - 0 - - 2  Tired, decreased energy - 3 - - 3  Change in appetite - 0 - - 3  Feeling bad or failure about yourself  - 0 - - 0  Trouble concentrating - 0 - - 2  Moving slowly or fidgety/restless - 3 - - 2  Suicidal thoughts - 0 - - 0  PHQ-9 Score - 6 - - 15  Difficult doing work/chores - Not difficult at all - - -    Allergies  Allergen Reactions  . Ibandronate Sodium Other (See Comments)    Bones hurt  . Pravachol [Pravastatin Sodium] Other (See Comments)    Myalgias   . Ultram [Tramadol Hcl]     nausea   Social History   Tobacco Use  . Smoking status: Former Smoker    Last attempt to quit: 03/18/1985    Years since quitting: 32.1  . Smokeless tobacco: Never Used  . Tobacco comment: denies   Substance  Use Topics  . Alcohol use: No   Past Medical History:  Diagnosis Date  . Anxiety   . Atrial fibrillation ([place])    longstanding persistent  . 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 Risa Grill  . Colon polyps   . Depression   . GERD (gastroesophageal reflux disease)   . Headache(784.0)    hx of migraines  . Hematuria    hx  . HLD (hyperlipidemia)    mixed  . HTN (hypertension)   . Hypothyroidism   . Insomnia   . Liver function test abnormality    hx fatty liver  . Murmur, cardiac   . Nonalcoholic fatty liver disease   . Obesity   . Osteoarthritis    low back pain with left sciatica  . Osteopenia    dexa 2013  . Renal calculus    hx  . Restless leg syndrome    Past Surgical History:  Procedure Laterality Date  . APPENDECTOMY    . CARDIAC CATHETERIZATION  1/12   revealed normal cors and LV function  . COLONOSCOPY    . cystocopy  10/17/03   left stent placement, TURBT  . FOOT SURGERY    .  LUMBAR LAMINECTOMY/DECOMPRESSION MICRODISCECTOMY N/A 01/19/2013   Procedure: LUMBAR THREE FOUR LUMBAR LAMINECTOMY/DECOMPRESSION MICRODISCECTOMY 1 LEVEL;  Surgeon: Faythe Ghee, MD;  Location: [facility];  Service: Neurosurgery;  Laterality: N/A;  . TOTAL ABDOMINAL HYSTERECTOMY  1985   Family History  Problem Relation Age of Onset  . Heart failure Mother   . Heart attack Mother   . Hypertension Mother   . Thyroid cancer Mother   . Stroke Father   . Hypertension Father   . Stroke Brother   . Breast cancer Neg Hx   . Colon cancer Neg Hx    Allergies as of 04/30/2017      Reactions   Ibandronate Sodium Other (See Comments)   Bones hurt   Pravachol [pravastatin Sodium] Other (See Comments)   Myalgias   Ultram [tramadol Hcl]    nausea      Medication List        Accurate as of 04/30/17 11:07 AM. Always use your most recent med list.          amLODipine 10 MG tablet Commonly known as:  NORVASC Take 1 tablet (10 mg total) by mouth daily.   benazepril  20 MG tablet Commonly known as:  LOTENSIN Take 1 tablet (20 mg total) by mouth daily. Needs office visit prior to anymore refills.   CALTRATE 600 1500 (600 Ca) MG Tabs tablet Generic drug:  calcium carbonate Take 3 tablets by mouth daily.   CENTRUM SILVER tablet Take 1 tablet by mouth daily.   cholecalciferol 1000 units tablet Commonly known as:  VITAMIN D Take 1,000 Units by mouth daily.   escitalopram 20 MG tablet Commonly known as:  LEXAPRO Take 1 tablet (20 mg total) by mouth daily.   ezetimibe 10 MG tablet Commonly known as:  ZETIA Take 1 tablet (10 mg total) by mouth daily.   furosemide 20 MG tablet Commonly known as:  LASIX TAKE 1 TABLET BY MOUTH EVERY MONDAY, WEDNESDAY AND FRIDAY   levothyroxine 75 MCG tablet Commonly known as:  SYNTHROID, LEVOTHROID Take 1 tablet (75 mcg total) by mouth daily.   metoprolol tartrate 50 MG tablet Commonly known as:  LOPRESSOR Take 0.5 tablets (25 mg total) by mouth 2 (two) times daily.   nitroGLYCERIN 0.4 MG SL tablet Commonly known as:  NITROSTAT PLACE 1 TABLET UNDER THE TONGUE EVERY 5 MINUTES AS NEEDED FOR CHEST PAIN,(MAX 3 TABLETS)   traZODone 50 MG tablet Commonly known as:  DESYREL TAKE 1 TO 2 TABLETS(50 TO 100 MG) BY MOUTH AT BEDTIME AS NEEDED FOR SLEEP   warfarin 5 MG tablet Commonly known as:  COUMADIN Take as directed by the anticoagulation clinic. If you are unsure how to take this medication, talk to your nurse or doctor. Original instructions:  TAKE AS DIRECTED BY COUMADIN CLINIC       All past medical history, surgical history, allergies, family history, immunizations andmedications were updated in the EMR today and reviewed under the history and medication portions of their EMR.     ROS: Negative, with the exception of above mentioned in HPI  Objective:  BP 137/77 (BP Location: Left Arm, Patient Position: Sitting, Cuff Size: Large)   Pulse 70   Temp (!) 97.5 F (36.4 C)   Resp 20   Wt 197 lb 12 oz  (89.7 kg)   SpO2 98%   BMI 32.91 kg/m  Body mass index is 32.91 kg/m. Gen: Afebrile. No acute distress. Nontoxic in appearance, well developed, well nourished.  HENT: AT.  [place].  MMM, no oral lesions.  Eyes:Pupils Equal Round Reactive to light, Extraocular movements intact,  Conjunctiva without redness, discharge or icterus. Skin: Red raised rash bilateral hands, forearm and bicep. One area located left anterior neck. Rash on hands appear like small blisters. Other rashes or dry scaly red base with excoriations and bruising.  no purpura or petechiae.  Neuro: Normal gait. PERLA. EOMi. Alert. Oriented x3   No exam data present No results found. No results found for this or any previous visit (from the past 24 hour(s)).  Assessment/Plan: KENADIE ROYCE is a 72 y.o. female present for OV for  Rash - Rash appears to be dermatitis, especially between her fingers. Does not look consistent with scabies. No obvious contact with new product. - IM Depo-Medrol provided today, Vanos steroid cream BID - methylPREDNISolone acetate (DEPO-MEDROL) injection 80 mg - vistaril QHS for night time itching, told to hold trazodone if taking vistaril.  - F/U 1-2 weeks if not improving.    Reviewed expectations re: course of current medical issues.  Discussed self-management of symptoms.  Outlined signs and symptoms indicating need for more acute intervention.  Patient verbalized understanding and all questions were answered.  Patient received an After-Visit Summary.    No orders of the defined types were placed in this encounter.    Note is dictated utilizing voice recognition software. Although note has been proof read prior to signing, occasional typographical errors still can be missed. If any questions arise, please do not hesitate to call for verification.   electronically signed by:  Howard Pouch, DO  [place]

## 2017-05-12 ENCOUNTER — Ambulatory Visit (INDEPENDENT_AMBULATORY_CARE_PROVIDER_SITE_OTHER): Payer: Medicare Other | Admitting: *Deleted

## 2017-05-12 DIAGNOSIS — Z7901 Long term (current) use of anticoagulants: Secondary | ICD-10-CM | POA: Diagnosis not present

## 2017-05-12 DIAGNOSIS — Z5181 Encounter for therapeutic drug level monitoring: Secondary | ICD-10-CM

## 2017-05-12 LAB — POCT INR: INR: 2.9

## 2017-05-12 NOTE — Patient Instructions (Signed)
Description   Continue taking 1 tablet every day except 1.5 tablets on Sundays and Thursdays.  Recheck INR in 6 weeks. # [phone] Coumadin clinic. Main # [phone].

## 2017-05-20 ENCOUNTER — Other Ambulatory Visit: Payer: Self-pay | Admitting: Family Medicine

## 2017-05-20 DIAGNOSIS — I10 Essential (primary) hypertension: Secondary | ICD-10-CM

## 2017-05-28 ENCOUNTER — Other Ambulatory Visit: Payer: Self-pay | Admitting: Internal Medicine

## 2017-05-30 ENCOUNTER — Other Ambulatory Visit: Payer: Self-pay | Admitting: Family Medicine

## 2017-05-30 DIAGNOSIS — E785 Hyperlipidemia, unspecified: Secondary | ICD-10-CM

## 2017-06-02 ENCOUNTER — Ambulatory Visit (INDEPENDENT_AMBULATORY_CARE_PROVIDER_SITE_OTHER): Payer: Medicare Other | Admitting: Family Medicine

## 2017-06-02 ENCOUNTER — Encounter: Payer: Self-pay | Admitting: Family Medicine

## 2017-06-02 VITALS — BP 137/75 | HR 60 | Temp 97.3°F | Resp 20 | Ht 65.0 in | Wt 205.0 lb

## 2017-06-02 DIAGNOSIS — L309 Dermatitis, unspecified: Secondary | ICD-10-CM

## 2017-06-02 MED ORDER — HYDROXYZINE PAMOATE 25 MG PO CAPS: 25.0000 mg | ORAL_CAPSULE | Freq: Every day | ORAL | 1 refills | Status: DC

## 2017-06-02 MED ORDER — FLUOCINOLONE ACETONIDE 0.01 % EX CREA: TOPICAL_CREAM | Freq: Two times a day (BID) | CUTANEOUS | 0 refills | Status: DC

## 2017-06-02 MED ORDER — PREDNISONE 20 MG PO TABS: 20.0000 mg | ORAL_TABLET | Freq: Every day | ORAL | 0 refills | Status: DC

## 2017-06-02 NOTE — Patient Instructions (Addendum)
allegra daily--> OTC antihistamine  Continue hydroxyzine at night. Cream called in for you to use over areas.  Steroid taper prescribed for 10 days.  In 2 weeks try to use the allgera alone and see if rash returns, if it does you can restart hydroxyzine and cream.  Dermatology referral placed.

## 2017-06-02 NOTE — Progress Notes (Signed)
Jennifer House , 1945/08/13, 72 y.o., female MRN: [id number] Patient Care Team    Relationship Specialty Notifications Start End  Ma Hillock, DO PCP - General Family Medicine  01/23/15   Thompson Grayer, MD Attending Physician Cardiology  07/25/11   Ronald Lobo, MD Consulting Physician Gastroenterology  08/25/15   Rana Snare, MD Consulting Physician Urology  08/25/15   Renelda Loma, OD  Optometry  08/25/15    Comment: "kimberly" at [facility]    Chief Complaint  Patient presents with  . Rash    neck,legs,arms x 2 weeks     Subjective: Patient presents for follow-up today on rash.  She reports the rash completely resolved with the use of Vistaril, steroid ejected and topical steroid.  However as soon as she ran out of the medication the rash returned.  Rechecked all of her products at home and none of them had changed.  No new medication changes.  Prior note April 30, 2017 Pt presents for an OV with complaints of itchy rash of one week duration.  Associated symptoms include rashes spreading from hands arms to neck. She denies any new hygiene products, clothing or cleaning agents. She denies any recent travel. She denies any new medications. She has never had a rash similar. She reports that the itching so much she is taking into her skin and bruising herself. He has tried cortisone cream, Benadryl and baby oil without any success.  Depression screen [facility] 2/9 04/30/2017 03/26/2017 09/19/2016 09/19/2016 07/08/2016  Decreased Interest 0 0 0 0 1  Down, Depressed, Hopeless 0 0 0 0 2  PHQ - 2 Score 0 0 0 0 3  Altered sleeping - 0 - - 2  Tired, decreased energy - 3 - - 3  Change in appetite - 0 - - 3  Feeling bad or failure about yourself  - 0 - - 0  Trouble concentrating - 0 - - 2  Moving slowly or fidgety/restless - 3 - - 2  Suicidal thoughts - 0 - - 0  PHQ-9 Score - 6 - - 15  Difficult doing work/chores - Not difficult at all - - -    Allergies  Allergen Reactions  . Ibandronate  Sodium Other (See Comments)    Bones hurt  . Pravachol [Pravastatin Sodium] Other (See Comments)    Myalgias   . Ultram [Tramadol Hcl]     nausea   Social History   Tobacco Use  . Smoking status: Former Smoker    Last attempt to quit: 03/18/1985    Years since quitting: 32.2  . Smokeless tobacco: Never Used  . Tobacco comment: denies   Substance Use Topics  . Alcohol use: No   Past Medical History:  Diagnosis Date  . Anxiety   . Atrial fibrillation ([place])    longstanding persistent  . 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 Risa Grill  . Colon polyps   . Depression   . GERD (gastroesophageal reflux disease)   . Headache(784.0)    hx of migraines  . Hematuria    hx  . HLD (hyperlipidemia)    mixed  . HTN (hypertension)   . Hypothyroidism   . Insomnia   . Liver function test abnormality    hx fatty liver  . Murmur, cardiac   . Nonalcoholic fatty liver disease   . Obesity   . Osteoarthritis    low back pain with left sciatica  . Osteopenia  dexa 2013  . Renal calculus    hx  . Restless leg syndrome    Past Surgical History:  Procedure Laterality Date  . APPENDECTOMY    . CARDIAC CATHETERIZATION  1/12   revealed normal cors and LV function  . COLONOSCOPY    . cystocopy  10/17/03   left stent placement, TURBT  . FOOT SURGERY    . LUMBAR LAMINECTOMY/DECOMPRESSION MICRODISCECTOMY N/A 01/19/2013   Procedure: LUMBAR THREE FOUR LUMBAR LAMINECTOMY/DECOMPRESSION MICRODISCECTOMY 1 LEVEL;  Surgeon: Faythe Ghee, MD;  Location: [facility];  Service: Neurosurgery;  Laterality: N/A;  . TOTAL ABDOMINAL HYSTERECTOMY  1985   Family History  Problem Relation Age of Onset  . Heart failure Mother   . Heart attack Mother   . Hypertension Mother   . Thyroid cancer Mother   . Stroke Father   . Hypertension Father   . Stroke Brother   . Breast cancer Neg Hx   . Colon cancer Neg Hx    Allergies as of 06/02/2017      Reactions   Ibandronate Sodium Other  (See Comments)   Bones hurt   Pravachol [pravastatin Sodium] Other (See Comments)   Myalgias   Ultram [tramadol Hcl]    nausea      Medication List        Accurate as of 06/02/17 10:21 AM. Always use your most recent med list.          amLODipine 10 MG tablet Commonly known as:  NORVASC Take 1 tablet (10 mg total) by mouth daily.   benazepril 20 MG tablet Commonly known as:  LOTENSIN Take 1 tablet (20 mg total) by mouth daily. Needs office visit prior to anymore refills.   CALTRATE 600 1500 (600 Ca) MG Tabs tablet Generic drug:  calcium carbonate Take 3 tablets by mouth daily.   CENTRUM SILVER tablet Take 1 tablet by mouth daily.   cholecalciferol 1000 units tablet Commonly known as:  VITAMIN D Take 1,000 Units by mouth daily.   escitalopram 20 MG tablet Commonly known as:  LEXAPRO Take 1 tablet (20 mg total) by mouth daily.   ezetimibe 10 MG tablet Commonly known as:  ZETIA Take 1 tablet (10 mg total) by mouth daily.   fluocinolone 0.01 % cream Commonly known as:  VANOS Apply topically 2 (two) times daily.   furosemide 20 MG tablet Commonly known as:  LASIX TAKE 1 TABLET BY MOUTH EVERY MONDAY, WEDNESDAY AND FRIDAY   hydrOXYzine 25 MG capsule Commonly known as:  VISTARIL Take 1 capsule (25 mg total) by mouth at bedtime.   levothyroxine 75 MCG tablet Commonly known as:  SYNTHROID, LEVOTHROID Take 1 tablet (75 mcg total) by mouth daily.   metoprolol tartrate 50 MG tablet Commonly known as:  LOPRESSOR Take 0.5 tablets (25 mg total) by mouth 2 (two) times daily.   nitroGLYCERIN 0.4 MG SL tablet Commonly known as:  NITROSTAT PLACE 1 TABLET UNDER THE TONGUE EVERY 5 MINUTES AS NEEDED FOR CHEST PAIN,(MAX 3 TABLETS)   traZODone 50 MG tablet Commonly known as:  DESYREL TAKE 1 TO 2 TABLETS(50 TO 100 MG) BY MOUTH AT BEDTIME AS NEEDED FOR SLEEP   warfarin 5 MG tablet Commonly known as:  COUMADIN Take as directed by the anticoagulation clinic. If you are  unsure how to take this medication, talk to your nurse or doctor. Original instructions:  TAKE AS DIRECTED BY COUMADIN CLINIC       All past medical history, surgical history, allergies,  family history, immunizations andmedications were updated in the EMR today and reviewed under the history and medication portions of their EMR.     ROS: Negative, with the exception of above mentioned in HPI  Objective:  BP 137/75 (BP Location: Left Arm, Patient Position: Sitting, Cuff Size: Large)   Pulse 60   Temp (!) 97.3 F (36.3 C)   Resp 20   Ht 5\' 5"  (1.651 m)   Wt 205 lb (93 kg)   SpO2 97%   BMI 34.11 kg/m  Body mass index is 34.11 kg/m.  Gen: Afebrile. No acute distress.  Skin: Left anterior thigh quarter sized red raised rash, small areas of bilateral arms and surrounding neck.  Rashes, no purpura or petechiae. Bruising present from scratching.  Neuro: Normal gait. PERLA. EOMi. Alert. Oriented x3  Psych: Normal affect, dress and demeanor. Normal speech. Normal thought content and judgment..    No exam data present No results found. No results found for this or any previous visit (from the past 24 hour(s)).  Assessment/Plan: Jennifer House is a 72 y.o. female present for OV for  Rash -She reports complete resolution with Vistaril, IM Depo Medrol and vanos cream. AS soon as she ran out of medication rash slowly started to return.  - Vanos steroid cream BID refilled.  Steroid taper provided today.  Continue Vistaril nightly. - Start over-the-counter Allegra daily. -Referral to dermatology placed. - F/U 2 weeks if not improving.    Reviewed expectations re: course of current medical issues.  Discussed self-management of symptoms.  Outlined signs and symptoms indicating need for more acute intervention.  Patient verbalized understanding and all questions were answered.  Patient received an After-Visit Summary.    No orders of the defined types were placed in this  encounter.    Note is dictated utilizing voice recognition software. Although note has been proof read prior to signing, occasional typographical errors still can be missed. If any questions arise, please do not hesitate to call for verification.   electronically signed by:  Howard Pouch, DO  [place]

## 2017-06-05 DIAGNOSIS — L300 Nummular dermatitis: Secondary | ICD-10-CM | POA: Diagnosis not present

## 2017-06-05 DIAGNOSIS — D692 Other nonthrombocytopenic purpura: Secondary | ICD-10-CM | POA: Diagnosis not present

## 2017-06-27 ENCOUNTER — Ambulatory Visit (INDEPENDENT_AMBULATORY_CARE_PROVIDER_SITE_OTHER): Payer: Medicare Other | Admitting: Pharmacist

## 2017-06-27 DIAGNOSIS — Z7901 Long term (current) use of anticoagulants: Secondary | ICD-10-CM | POA: Diagnosis not present

## 2017-06-27 LAB — POCT INR: INR: 4.1

## 2017-06-27 NOTE — Patient Instructions (Signed)
Description   No Coumadin today then only 1/2 tablet tomorrow then Continue taking 1 tablet every day except 1.5 tablets on Sundays and Thursdays.  Recheck INR in 3 weeks. # [phone] Coumadin clinic. Main # [phone].

## 2017-06-28 ENCOUNTER — Other Ambulatory Visit: Payer: Self-pay | Admitting: Family Medicine

## 2017-06-28 DIAGNOSIS — I10 Essential (primary) hypertension: Secondary | ICD-10-CM

## 2017-07-18 ENCOUNTER — Ambulatory Visit: Payer: Medicare Other | Admitting: Pharmacist

## 2017-07-18 DIAGNOSIS — Z7901 Long term (current) use of anticoagulants: Secondary | ICD-10-CM | POA: Diagnosis not present

## 2017-07-18 LAB — POCT INR: INR: 3.2

## 2017-07-18 NOTE — Patient Instructions (Signed)
Description   Start taking 1 tablet every day except 1.5 tablets on Sundays. Eat some extra greens today.  Recheck INR in 3 weeks. # [phone] Coumadin clinic. Main # [phone].

## 2017-07-29 ENCOUNTER — Encounter: Payer: Self-pay | Admitting: Internal Medicine

## 2017-08-08 ENCOUNTER — Ambulatory Visit: Payer: Medicare Other | Admitting: *Deleted

## 2017-08-08 DIAGNOSIS — Z7901 Long term (current) use of anticoagulants: Secondary | ICD-10-CM | POA: Diagnosis not present

## 2017-08-08 DIAGNOSIS — I4891 Unspecified atrial fibrillation: Secondary | ICD-10-CM | POA: Diagnosis not present

## 2017-08-08 LAB — POCT INR: INR: 3 (ref 2.0–3.0)

## 2017-08-08 NOTE — Patient Instructions (Signed)
Description   Continue taking 1 tablet every day except 1.5 tablets on Sundays. Eat some extra greens today and start eating 3 green leafy veggies weekly.  Recheck INR in 4 weeks. # [phone] Coumadin clinic. Main # [phone].

## 2017-08-18 ENCOUNTER — Ambulatory Visit: Payer: Medicare Other | Admitting: Internal Medicine

## 2017-08-18 ENCOUNTER — Encounter: Payer: Self-pay | Admitting: Internal Medicine

## 2017-08-18 VITALS — BP 110/70 | HR 71 | Ht 65.0 in | Wt 204.0 lb

## 2017-08-18 DIAGNOSIS — I10 Essential (primary) hypertension: Secondary | ICD-10-CM

## 2017-08-18 DIAGNOSIS — I482 Chronic atrial fibrillation, unspecified: Secondary | ICD-10-CM

## 2017-08-18 NOTE — Progress Notes (Signed)
PCP: Ma Hillock, DO   Primary EP: Dr Lenise Herald is a 72 y.o. female who presents today for routine electrophysiology followup.  Since last being seen in our clinic, the patient reports doing very well.  She continues to have SOB with moderate activity.  She does not exercise regularly. Today, she denies symptoms of palpitations, chest pain,  lower extremity edema, dizziness, presyncope, or syncope.  The patient is otherwise without complaint today.   Past Medical History:  Diagnosis Date  . Anxiety   . Atrial fibrillation ([place])    longstanding persistent  . 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 Risa Grill  . Colon polyps   . Depression   . GERD (gastroesophageal reflux disease)   . Headache(784.0)    hx of migraines  . Hematuria    hx  . HLD (hyperlipidemia)    mixed  . HTN (hypertension)   . Hypothyroidism   . Insomnia   . Liver function test abnormality    hx fatty liver  . Murmur, cardiac   . Nonalcoholic fatty liver disease   . Obesity   . Osteoarthritis    low back pain with left sciatica  . Osteopenia    dexa 2013  . Renal calculus    hx  . Restless leg syndrome    Past Surgical History:  Procedure Laterality Date  . APPENDECTOMY    . CARDIAC CATHETERIZATION  1/12   revealed normal cors and LV function  . COLONOSCOPY    . cystocopy  10/17/03   left stent placement, TURBT  . FOOT SURGERY    . LUMBAR LAMINECTOMY/DECOMPRESSION MICRODISCECTOMY N/A 01/19/2013   Procedure: LUMBAR THREE FOUR LUMBAR LAMINECTOMY/DECOMPRESSION MICRODISCECTOMY 1 LEVEL;  Surgeon: Faythe Ghee, MD;  Location: [facility];  Service: Neurosurgery;  Laterality: N/A;  . TOTAL ABDOMINAL HYSTERECTOMY  1985    ROS- all systems are reviewed and negatives except as per HPI above  Current Outpatient Medications  Medication Sig Dispense Refill  . amLODipine (NORVASC) 10 MG tablet Take 1 tablet (10 mg total) by mouth daily. 90 tablet 1  . benazepril  (LOTENSIN) 20 MG tablet Take 1 tablet (20 mg total) by mouth daily. Needs office visit prior to anymore refills. 90 tablet 1  . Calcium Carbonate (CALTRATE 600) 1500 MG TABS Take 3 tablets by mouth daily.      . cholecalciferol (VITAMIN D) 1000 UNITS tablet Take 1,000 Units by mouth daily.      Marland Kitchen escitalopram (LEXAPRO) 20 MG tablet Take 1 tablet (20 mg total) by mouth daily. 90 tablet 1  . ezetimibe (ZETIA) 10 MG tablet Take 1 tablet (10 mg total) by mouth daily. 90 tablet 1  . fluocinolone (VANOS) 0.01 % cream Apply topically 2 (two) times daily. 30 g 0  . furosemide (LASIX) 20 MG tablet TAKE 1 TABLET BY MOUTH EVERY MONDAY, WEDNESDAY AND FRIDAY 45 tablet 3  . hydrOXYzine (VISTARIL) 25 MG capsule Take 1 capsule (25 mg total) by mouth at bedtime. 30 capsule 1  . levothyroxine (SYNTHROID, LEVOTHROID) 75 MCG tablet Take 1 tablet (75 mcg total) by mouth daily. 90 tablet 1  . metoprolol tartrate (LOPRESSOR) 50 MG tablet Take 0.5 tablets (25 mg total) by mouth 2 (two) times daily. 90 tablet 1  . Multiple Vitamins-Minerals (CENTRUM SILVER) tablet Take 1 tablet by mouth daily.      . nitroGLYCERIN (NITROSTAT) 0.4 MG SL tablet PLACE 1 TABLET UNDER THE TONGUE EVERY  5 MINUTES AS NEEDED FOR CHEST PAIN,(MAX 3 TABLETS) 75 tablet 0  . traZODone (DESYREL) 50 MG tablet TAKE 1 TO 2 TABLETS(50 TO 100 MG) BY MOUTH AT BEDTIME AS NEEDED FOR SLEEP 180 tablet 1  . warfarin (COUMADIN) 5 MG tablet TAKE AS DIRECTED BY COUMADIN CLINIC 40 tablet 3   No current facility-administered medications for this visit.     Physical Exam: Vitals:   08/18/17 1110  BP: 110/70  Pulse: 71  Weight: 204 lb (92.5 kg)  Height: 5\' 5"  (1.651 m)    GEN- The patient is well appearing, alert and oriented x 3 today.   Head- normocephalic, atraumatic Eyes-  Sclera clear, conjunctiva pink Ears- hearing intact Oropharynx- clear Lungs- Clear to ausculation bilaterally, normal work of breathing Heart- irregular rate and rhythm, no murmurs,  rubs or gallops, PMI not laterally displaced GI- soft, NT, ND, + BS Extremities- no clubbing, cyanosis, or edema  Wt Readings from Last 3 Encounters:  08/18/17 204 lb (92.5 kg)  06/02/17 205 lb (93 kg)  04/30/17 197 lb 12 oz (89.7 kg)    EKG tracing ordered today is personally reviewed and shows afib, V rate 71 bpm  Assessment and Plan:  1. Permanent afib Rate controlled On coumadin  2. HTN Stable No change required today  3. HL Stable Lipids 7/18 reviewed No change required today  4. Obesity Body mass index is 33.95 kg/m. Wt Readings from Last 3 Encounters:  08/18/17 204 lb (92.5 kg)  06/02/17 205 lb (93 kg)  04/30/17 197 lb 12 oz (89.7 kg)    Due to annual visit with PCP in July for labs  Return to see EP PA annually.  I will see when needed  Thompson Grayer MD, [facility] 08/18/2017 11:41 AM

## 2017-08-18 NOTE — Patient Instructions (Addendum)

## 2017-08-25 ENCOUNTER — Inpatient Hospital Stay (HOSPITAL_COMMUNITY): Payer: Medicare Other

## 2017-08-25 ENCOUNTER — Emergency Department (HOSPITAL_COMMUNITY): Payer: Medicare Other

## 2017-08-25 ENCOUNTER — Other Ambulatory Visit: Payer: Self-pay

## 2017-08-25 ENCOUNTER — Encounter (HOSPITAL_COMMUNITY): Payer: Self-pay | Admitting: Emergency Medicine

## 2017-08-25 ENCOUNTER — Inpatient Hospital Stay (HOSPITAL_COMMUNITY)
Admission: EM | Admit: 2017-08-25 | Discharge: 2017-09-02 | DRG: 470 | Disposition: A | Discharge status: Home Health | Payer: Medicare Other | Attending: Internal Medicine | Admitting: Internal Medicine

## 2017-08-25 DIAGNOSIS — Y92013 Bedroom of single-family (private) house as the place of occurrence of the external cause: Secondary | ICD-10-CM | POA: Diagnosis not present

## 2017-08-25 DIAGNOSIS — K219 Gastro-esophageal reflux disease without esophagitis: Secondary | ICD-10-CM | POA: Diagnosis present

## 2017-08-25 DIAGNOSIS — Z6833 Body mass index (BMI) 33.0-33.9, adult: Secondary | ICD-10-CM | POA: Diagnosis not present

## 2017-08-25 DIAGNOSIS — F329 Major depressive disorder, single episode, unspecified: Secondary | ICD-10-CM | POA: Diagnosis present

## 2017-08-25 DIAGNOSIS — S72092A Other fracture of head and neck of left femur, initial encounter for closed fracture: Secondary | ICD-10-CM | POA: Diagnosis not present

## 2017-08-25 DIAGNOSIS — Z888 Allergy status to other drugs, medicaments and biological substances status: Secondary | ICD-10-CM

## 2017-08-25 DIAGNOSIS — E785 Hyperlipidemia, unspecified: Secondary | ICD-10-CM | POA: Diagnosis not present

## 2017-08-25 DIAGNOSIS — E039 Hypothyroidism, unspecified: Secondary | ICD-10-CM | POA: Diagnosis present

## 2017-08-25 DIAGNOSIS — M25752 Osteophyte, left hip: Secondary | ICD-10-CM | POA: Diagnosis present

## 2017-08-25 DIAGNOSIS — S72042A Displaced fracture of base of neck of left femur, initial encounter for closed fracture: Principal | ICD-10-CM | POA: Diagnosis present

## 2017-08-25 DIAGNOSIS — I482 Chronic atrial fibrillation: Secondary | ICD-10-CM | POA: Diagnosis not present

## 2017-08-25 DIAGNOSIS — S8992XA Unspecified injury of left lower leg, initial encounter: Secondary | ICD-10-CM | POA: Diagnosis not present

## 2017-08-25 DIAGNOSIS — S72002A Fracture of unspecified part of neck of left femur, initial encounter for closed fracture: Secondary | ICD-10-CM

## 2017-08-25 DIAGNOSIS — G2581 Restless legs syndrome: Secondary | ICD-10-CM | POA: Diagnosis not present

## 2017-08-25 DIAGNOSIS — R918 Other nonspecific abnormal finding of lung field: Secondary | ICD-10-CM | POA: Diagnosis not present

## 2017-08-25 DIAGNOSIS — R110 Nausea: Secondary | ICD-10-CM | POA: Diagnosis not present

## 2017-08-25 DIAGNOSIS — F419 Anxiety disorder, unspecified: Secondary | ICD-10-CM | POA: Diagnosis present

## 2017-08-25 DIAGNOSIS — K760 Fatty (change of) liver, not elsewhere classified: Secondary | ICD-10-CM | POA: Diagnosis present

## 2017-08-25 DIAGNOSIS — E781 Pure hyperglyceridemia: Secondary | ICD-10-CM | POA: Diagnosis not present

## 2017-08-25 DIAGNOSIS — Z7901 Long term (current) use of anticoagulants: Secondary | ICD-10-CM | POA: Diagnosis not present

## 2017-08-25 DIAGNOSIS — M25562 Pain in left knee: Secondary | ICD-10-CM | POA: Diagnosis not present

## 2017-08-25 DIAGNOSIS — Z87891 Personal history of nicotine dependence: Secondary | ICD-10-CM

## 2017-08-25 DIAGNOSIS — R0902 Hypoxemia: Secondary | ICD-10-CM | POA: Diagnosis not present

## 2017-08-25 DIAGNOSIS — Z885 Allergy status to narcotic agent status: Secondary | ICD-10-CM

## 2017-08-25 DIAGNOSIS — I4891 Unspecified atrial fibrillation: Secondary | ICD-10-CM | POA: Diagnosis not present

## 2017-08-25 DIAGNOSIS — D62 Acute posthemorrhagic anemia: Secondary | ICD-10-CM | POA: Diagnosis not present

## 2017-08-25 DIAGNOSIS — W06XXXA Fall from bed, initial encounter: Secondary | ICD-10-CM | POA: Diagnosis present

## 2017-08-25 DIAGNOSIS — Z79899 Other long term (current) drug therapy: Secondary | ICD-10-CM | POA: Diagnosis not present

## 2017-08-25 DIAGNOSIS — R51 Headache: Secondary | ICD-10-CM | POA: Diagnosis not present

## 2017-08-25 DIAGNOSIS — Y9389 Activity, other specified: Secondary | ICD-10-CM

## 2017-08-25 DIAGNOSIS — Z8249 Family history of ischemic heart disease and other diseases of the circulatory system: Secondary | ICD-10-CM

## 2017-08-25 DIAGNOSIS — T148XXA Other injury of unspecified body region, initial encounter: Secondary | ICD-10-CM

## 2017-08-25 DIAGNOSIS — E559 Vitamin D deficiency, unspecified: Secondary | ICD-10-CM | POA: Diagnosis not present

## 2017-08-25 DIAGNOSIS — S59902A Unspecified injury of left elbow, initial encounter: Secondary | ICD-10-CM | POA: Diagnosis not present

## 2017-08-25 DIAGNOSIS — Z01810 Encounter for preprocedural cardiovascular examination: Secondary | ICD-10-CM

## 2017-08-25 DIAGNOSIS — I10 Essential (primary) hypertension: Secondary | ICD-10-CM | POA: Diagnosis present

## 2017-08-25 DIAGNOSIS — I4581 Long QT syndrome: Secondary | ICD-10-CM | POA: Diagnosis present

## 2017-08-25 DIAGNOSIS — Z79890 Hormone replacement therapy (postmenopausal): Secondary | ICD-10-CM

## 2017-08-25 DIAGNOSIS — Z96642 Presence of left artificial hip joint: Secondary | ICD-10-CM

## 2017-08-25 DIAGNOSIS — E669 Obesity, unspecified: Secondary | ICD-10-CM | POA: Diagnosis present

## 2017-08-25 DIAGNOSIS — M25522 Pain in left elbow: Secondary | ICD-10-CM | POA: Diagnosis not present

## 2017-08-25 DIAGNOSIS — S72002D Fracture of unspecified part of neck of left femur, subsequent encounter for closed fracture with routine healing: Secondary | ICD-10-CM | POA: Diagnosis not present

## 2017-08-25 DIAGNOSIS — Z419 Encounter for procedure for purposes other than remedying health state, unspecified: Secondary | ICD-10-CM

## 2017-08-25 DIAGNOSIS — Z8551 Personal history of malignant neoplasm of bladder: Secondary | ICD-10-CM

## 2017-08-25 DIAGNOSIS — Z823 Family history of stroke: Secondary | ICD-10-CM

## 2017-08-25 DIAGNOSIS — S72009A Fracture of unspecified part of neck of unspecified femur, initial encounter for closed fracture: Secondary | ICD-10-CM | POA: Diagnosis present

## 2017-08-25 DIAGNOSIS — Z471 Aftercare following joint replacement surgery: Secondary | ICD-10-CM | POA: Diagnosis not present

## 2017-08-25 DIAGNOSIS — R269 Unspecified abnormalities of gait and mobility: Secondary | ICD-10-CM | POA: Diagnosis not present

## 2017-08-25 LAB — BASIC METABOLIC PANEL
Anion gap: 7 (ref 5–15)
BUN: 14 mg/dL (ref 6–20)
CO2: 25 mmol/L (ref 22–32)
Calcium: 9.1 mg/dL (ref 8.9–10.3)
Chloride: 108 mmol/L (ref 101–111)
Creatinine, Ser: 0.82 mg/dL (ref 0.44–1.00)
GFR calc Af Amer: >60 mL/min (ref >60)
GLUCOSE: 152 mg/dL — AB (ref 65–99)
POTASSIUM: 3.8 mmol/L (ref 3.5–5.1)
SODIUM: 140 mmol/L (ref 135–145)

## 2017-08-25 LAB — CBC WITH DIFFERENTIAL/PLATELET
ABS IMMATURE GRANULOCYTES: 0 10*3/uL (ref 0.0–0.1)
BASOS ABS: 0.1 10*3/uL (ref 0.0–0.1)
Basophils Relative: 1 %
EOS ABS: 0.1 10*3/uL (ref 0.0–0.7)
Eosinophils Relative: 2 %
HCT: 41.3 % (ref 36.0–46.0)
Hemoglobin: 13 g/dL (ref 12.0–15.0)
IMMATURE GRANULOCYTES: 1 %
Lymphocytes Relative: 26 %
Lymphs Abs: 1.7 10*3/uL (ref 0.7–4.0)
MCH: 27 pg (ref 26.0–34.0)
MCHC: 31.5 g/dL (ref 30.0–36.0)
MCV: 85.7 fL (ref 78.0–100.0)
MONO ABS: 0.3 10*3/uL (ref 0.1–1.0)
MONOS PCT: 5 %
NEUTROS ABS: 4.2 10*3/uL (ref 1.7–7.7)
NEUTROS PCT: 65 %
PLATELETS: 180 10*3/uL (ref 150–400)
RBC: 4.82 MIL/uL (ref 3.87–5.11)
RDW: 14.4 % (ref 11.5–15.5)
WBC: 6.4 10*3/uL (ref 4.0–10.5)

## 2017-08-25 LAB — PROTIME-INR
INR: 2.59
PROTHROMBIN TIME: 27.6 s — AB (ref 11.4–15.2)

## 2017-08-25 LAB — TSH: TSH: 0.804 u[IU]/mL (ref 0.350–4.500)

## 2017-08-25 MED ORDER — METOPROLOL TARTRATE 25 MG PO TABS
25.0000 mg | ORAL_TABLET | Freq: Two times a day (BID) | ORAL | Status: DC
  Administered 2017-08-25 – 2017-09-02 (×15): 25 mg via ORAL
  Filled 2017-08-25 (×16): qty 1

## 2017-08-25 MED ORDER — EZETIMIBE 10 MG PO TABS
10.0000 mg | ORAL_TABLET | Freq: Every day | ORAL | Status: DC
  Administered 2017-08-25 – 2017-09-02 (×8): 10 mg via ORAL
  Filled 2017-08-25 (×8): qty 1

## 2017-08-25 MED ORDER — ESCITALOPRAM OXALATE 10 MG PO TABS
20.0000 mg | ORAL_TABLET | Freq: Every day | ORAL | Status: DC
  Administered 2017-08-25 – 2017-09-02 (×8): 20 mg via ORAL
  Filled 2017-08-25 (×9): qty 2

## 2017-08-25 MED ORDER — DOCUSATE SODIUM 100 MG PO CAPS
100.0000 mg | ORAL_CAPSULE | Freq: Two times a day (BID) | ORAL | Status: DC
  Administered 2017-08-25 – 2017-08-27 (×6): 100 mg via ORAL
  Filled 2017-08-25 (×6): qty 1

## 2017-08-25 MED ORDER — FENTANYL CITRATE (PF) 100 MCG/2ML IJ SOLN
50.0000 ug | Freq: Once | INTRAMUSCULAR | Status: AC | PRN
  Administered 2017-08-25: 50 ug via INTRAVENOUS
  Filled 2017-08-25: qty 2

## 2017-08-25 MED ORDER — AMLODIPINE BESYLATE 10 MG PO TABS
10.0000 mg | ORAL_TABLET | Freq: Every day | ORAL | Status: DC
  Administered 2017-08-26 – 2017-09-02 (×4): 10 mg via ORAL
  Filled 2017-08-25 (×6): qty 1

## 2017-08-25 MED ORDER — HYDROXYZINE HCL 25 MG PO TABS
25.0000 mg | ORAL_TABLET | Freq: Every day | ORAL | Status: DC
  Administered 2017-08-25 – 2017-09-01 (×8): 25 mg via ORAL
  Filled 2017-08-25 (×8): qty 1

## 2017-08-25 MED ORDER — FLEET ENEMA 7-19 GM/118ML RE ENEM: 1.0000 | ENEMA | Freq: Once | RECTAL | Status: DC | PRN

## 2017-08-25 MED ORDER — METHOCARBAMOL 1000 MG/10ML IJ SOLN
500.0000 mg | Freq: Four times a day (QID) | INTRAVENOUS | Status: DC | PRN
  Filled 2017-08-25: qty 5

## 2017-08-25 MED ORDER — BENAZEPRIL HCL 20 MG PO TABS: 20.0000 mg | ORAL_TABLET | Freq: Every day | ORAL | Status: DC

## 2017-08-25 MED ORDER — POLYETHYLENE GLYCOL 3350 17 G PO PACK
17.0000 g | PACK | Freq: Every day | ORAL | Status: DC | PRN
  Filled 2017-08-25: qty 1

## 2017-08-25 MED ORDER — MORPHINE SULFATE (PF) 4 MG/ML IV SOLN
4.0000 mg | Freq: Once | INTRAVENOUS | Status: AC
  Administered 2017-08-25: 4 mg via INTRAVENOUS
  Filled 2017-08-25: qty 1

## 2017-08-25 MED ORDER — BISACODYL 5 MG PO TBEC
5.0000 mg | DELAYED_RELEASE_TABLET | Freq: Every day | ORAL | Status: DC | PRN
  Administered 2017-08-29: 5 mg via ORAL
  Filled 2017-08-25: qty 1

## 2017-08-25 MED ORDER — METHOCARBAMOL 500 MG PO TABS
500.0000 mg | ORAL_TABLET | Freq: Four times a day (QID) | ORAL | Status: DC | PRN
  Administered 2017-08-27 – 2017-09-02 (×9): 500 mg via ORAL
  Filled 2017-08-25 (×10): qty 1

## 2017-08-25 MED ORDER — LEVOTHYROXINE SODIUM 75 MCG PO TABS
75.0000 ug | ORAL_TABLET | Freq: Every day | ORAL | Status: DC
  Administered 2017-08-26 – 2017-09-02 (×8): 75 ug via ORAL
  Filled 2017-08-25 (×8): qty 1

## 2017-08-25 MED ORDER — TRAZODONE HCL 100 MG PO TABS
100.0000 mg | ORAL_TABLET | Freq: Every day | ORAL | Status: DC
  Administered 2017-08-25 – 2017-09-01 (×8): 100 mg via ORAL
  Filled 2017-08-25 (×8): qty 1

## 2017-08-25 MED ORDER — MORPHINE SULFATE (PF) 2 MG/ML IV SOLN
0.5000 mg | INTRAVENOUS | Status: DC | PRN
  Administered 2017-08-26 (×3): 0.5 mg via INTRAVENOUS
  Filled 2017-08-25 (×3): qty 1

## 2017-08-25 MED ORDER — HYDROXYZINE PAMOATE 25 MG PO CAPS: 25.0000 mg | ORAL_CAPSULE | Freq: Every day | ORAL | Status: DC

## 2017-08-25 MED ORDER — HYDROCODONE-ACETAMINOPHEN 5-325 MG PO TABS
1.0000 | ORAL_TABLET | Freq: Four times a day (QID) | ORAL | Status: DC | PRN
  Administered 2017-08-25 – 2017-08-28 (×6): 2 via ORAL
  Administered 2017-08-29: 1 via ORAL
  Administered 2017-08-29: 2 via ORAL
  Administered 2017-08-29: 1 via ORAL
  Administered 2017-08-30 – 2017-09-01 (×6): 2 via ORAL
  Administered 2017-09-02: 1 via ORAL
  Filled 2017-08-25: qty 2
  Filled 2017-08-25: qty 1
  Filled 2017-08-25 (×2): qty 2
  Filled 2017-08-25: qty 1
  Filled 2017-08-25 (×6): qty 2
  Filled 2017-08-25: qty 1
  Filled 2017-08-25: qty 2
  Filled 2017-08-25 (×2): qty 1
  Filled 2017-08-25 (×2): qty 2
  Filled 2017-08-25: qty 1
  Filled 2017-08-25 (×2): qty 2

## 2017-08-25 NOTE — Progress Notes (Signed)
Orthopedic Tech Progress Note Patient Details:  Jennifer House 1945/08/07 [id number]  Musculoskeletal Traction Type of Traction: [place] Skin Traction Traction Location: LLE Traction Weight: 5 lbs   Post Interventions Patient Tolerated: Well Instructions Provided: Care of device   Braulio Bosch 08/25/2017, 12:38 PM

## 2017-08-25 NOTE — ED Notes (Signed)
Patient transported to X-ray 

## 2017-08-25 NOTE — Consult Note (Signed)
Reason for Consult:Left hip fx Referring Physician: Thomasenia Bottoms  Jennifer House is an 72 y.o. female.  HPI: Jennifer House accidentally rolled out of bed onto the floor last night. She fell onto her left side and had immediate pain and could not get up. She was brought to the ED where x-rays showed a femoral neck fx and orthopedic surgery was consulted.   Past Medical History:  Diagnosis Date  . Anxiety   . Atrial fibrillation ([place])    longstanding persistent  . 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 Risa Grill  . Colon polyps   . Depression   . GERD (gastroesophageal reflux disease)   . Headache(784.0)    hx of migraines  . Hematuria    hx  . HLD (hyperlipidemia)    mixed  . HTN (hypertension)   . Hypothyroidism   . Insomnia   . Liver function test abnormality    hx fatty liver  . Murmur, cardiac   . Nonalcoholic fatty liver disease   . Obesity   . Osteoarthritis    low back pain with left sciatica  . Osteopenia    dexa 2013  . Renal calculus    hx  . Restless leg syndrome     Past Surgical History:  Procedure Laterality Date  . APPENDECTOMY    . CARDIAC CATHETERIZATION  1/12   revealed normal cors and LV function  . COLONOSCOPY    . cystocopy  10/17/03   left stent placement, TURBT  . FOOT SURGERY    . LUMBAR LAMINECTOMY/DECOMPRESSION MICRODISCECTOMY N/A 01/19/2013   Procedure: LUMBAR THREE FOUR LUMBAR LAMINECTOMY/DECOMPRESSION MICRODISCECTOMY 1 LEVEL;  Surgeon: Faythe Ghee, MD;  Location: [facility];  Service: Neurosurgery;  Laterality: N/A;  . TOTAL ABDOMINAL HYSTERECTOMY  1985    Family History  Problem Relation Age of Onset  . Heart failure Mother   . Heart attack Mother   . Hypertension Mother   . Thyroid cancer Mother   . Stroke Father   . Hypertension Father   . Stroke Brother   . Breast cancer Neg Hx   . Colon cancer Neg Hx     Social History:  reports that she quit smoking about 32 years ago. She has never used smokeless tobacco.  She reports that she does not drink alcohol or use drugs.  Allergies:  Allergies  Allergen Reactions  . Ibandronate Sodium Other (See Comments)    Bones hurt  . Pravachol [Pravastatin Sodium] Other (See Comments)    Myalgias   . Ultram [Tramadol Hcl]     nausea    Medications: I have reviewed the patient's current medications.  Results for orders placed or performed during the hospital encounter of 08/25/17 (from the past 48 hour(s))  CBC with Differential     Status: None   Collection Time: 08/25/17  8:35 AM  Result Value Ref Range   WBC 6.4 4.0 - 10.5 K/uL   RBC 4.82 3.87 - 5.11 MIL/uL   Hemoglobin 13.0 12.0 - 15.0 g/dL   HCT 41.3 36.0 - 46.0 %   MCV 85.7 78.0 - 100.0 fL   MCH 27.0 26.0 - 34.0 pg   MCHC 31.5 30.0 - 36.0 g/dL   RDW 14.4 11.5 - 15.5 %   Platelets 180 150 - 400 K/uL   Neutrophils Relative % 65 %   Neutro Abs 4.2 1.7 - 7.7 K/uL   Lymphocytes Relative 26 %   Lymphs Abs 1.7 0.7 - 4.0  K/uL   Monocytes Relative 5 %   Monocytes Absolute 0.3 0.1 - 1.0 K/uL   Eosinophils Relative 2 %   Eosinophils Absolute 0.1 0.0 - 0.7 K/uL   Basophils Relative 1 %   Basophils Absolute 0.1 0.0 - 0.1 K/uL   Immature Granulocytes 1 %   Abs Immature Granulocytes 0.0 0.0 - 0.1 K/uL    Comment: Performed at [facility] [address] [place], [place] [address]  Basic metabolic panel     Status: Abnormal   Collection Time: 08/25/17  8:35 AM  Result Value Ref Range   Sodium 140 135 - 145 mmol/L   Potassium 3.8 3.5 - 5.1 mmol/L   Chloride 108 101 - 111 mmol/L   CO2 25 22 - 32 mmol/L   Glucose, Bld 152 (H) 65 - 99 mg/dL   BUN 14 6 - 20 mg/dL   Creatinine, Ser 0.82 0.44 - 1.00 mg/dL   Calcium 9.1 8.9 - 10.3 mg/dL   GFR calc non Af Amer >60 >60 mL/min   GFR calc Af Amer >60 >60 mL/min    Comment: (NOTE) The eGFR has been calculated using the CKD EPI equation. This calculation has not been validated in all clinical situations. eGFR's persistently <60 mL/min signify  possible Chronic Kidney Disease.    Anion gap 7 5 - 15    Comment: Performed at [facility] [address] [place], [place] [address]  Protime-INR     Status: Abnormal   Collection Time: 08/25/17  8:35 AM  Result Value Ref Range   Prothrombin Time 27.6 (H) 11.4 - 15.2 seconds   INR 2.59     Comment: Performed at [facility] [address], [place], [place] [address]    Ct Head Wo Contrast  Result Date: 08/25/2017 CLINICAL DATA:  [place] out of bed today striking the left side of the face and head. Some headache. EXAM: CT HEAD WITHOUT CONTRAST TECHNIQUE: Contiguous axial images were obtained from the base of the skull through the vertex without intravenous contrast. COMPARISON:  None. FINDINGS: Brain: Brain atrophy. Extensive chronic small-vessel ischemic changes affecting the cerebral hemispheric deep and subcortical white matter. Small-vessel changes also of the brainstem, basal ganglia and thalami. No sign of acute infarction, mass lesion, hemorrhage, hydrocephalus or extra-axial collection. Vascular: There is atherosclerotic calcification of the major vessels at the base of the brain. Skull: No fracture. Sinuses/Orbits: Clear/normal Other: None IMPRESSION: No acute or traumatic finding. Brain atrophy with extensive chronic small-vessel ischemic changes throughout. Electronically Signed   By: Nelson Chimes M.D.   On: 08/25/2017 09:28   Dg Chest Port 1 View  Result Date: 08/25/2017 CLINICAL DATA:  Preop cardiovascular exam [phone], HTN, former smoker - quit 30 years ago EXAM: PORTABLE CHEST 1 VIEW COMPARISON:  Chest x-ray dated 08/29/2014. FINDINGS: Cardiomegaly. Aortic atherosclerosis. New small opacity within the LEFT lower lung. Lungs otherwise clear. No pleural effusion or pneumothorax seen. No acute or suspicious osseous finding. IMPRESSION: 1. New small ill-defined opacity within the LEFT lower lung. This is most likely focal scarring or atelectasis but chest CT is recommended  to ensure benignity given the smoking history. 2. Cardiomegaly. 3. Aortic atherosclerosis. Electronically Signed   By: Franki Cabot M.D.   On: 08/25/2017 11:01   Dg Hip Unilat W Or Wo Pelvis 2-3 Views Left  Result Date: 08/25/2017 CLINICAL DATA:  Left hip pain since the patient fell out of bed this morning. Initial encounter. EXAM: DG HIP (  WITH OR WITHOUT PELVIS) 2-3V LEFT COMPARISON:  None. FINDINGS: The patient has an acute fracture through the base of the left femoral neck. The femoral head is located. No other abnormality is identified. IMPRESSION: Acute basicervical fracture left hip. Electronically Signed   By: Inge Rise M.D.   On: 08/25/2017 09:10    Review of Systems  Constitutional: Negative for weight loss.  HENT: Negative for ear discharge, ear pain, hearing loss and tinnitus.   Eyes: Negative for blurred vision, double vision, photophobia and pain.  Respiratory: Negative for cough, sputum production and shortness of breath.   Cardiovascular: Negative for chest pain.  Gastrointestinal: Negative for abdominal pain, nausea and vomiting.  Genitourinary: Negative for dysuria, flank pain, frequency and urgency.  Musculoskeletal: Positive for joint pain (Left hip). Negative for back pain, falls, myalgias and neck pain.  Neurological: Negative for dizziness, tingling, sensory change, focal weakness, loss of consciousness and headaches.  Endo/Heme/Allergies: Does not bruise/bleed easily.  Psychiatric/Behavioral: Negative for depression, memory loss and substance abuse. The patient is not nervous/anxious.    Blood pressure 133/71, pulse 66, temperature 97.7 F (36.5 C), temperature source Oral, resp. rate 15, SpO2 95 %. Physical Exam  Constitutional: She appears well-developed and well-nourished. No distress.  HENT:  Head: Normocephalic and atraumatic.  Eyes: Conjunctivae are normal. Right eye exhibits no discharge. Left eye exhibits no discharge. No scleral icterus.  Neck:  Normal range of motion.  Cardiovascular: Normal rate and regular rhythm.  Respiratory: Effort normal. No respiratory distress.  Musculoskeletal:  LLE No traumatic wounds, ecchymosis, or rash  TTP hip, lateral knee  No knee or ankle effusion  Knee stable to varus/ valgus and anterior/posterior stress  Sens DPN, SPN, TN intact  Motor EHL, ext, flex, evers 5/5  DP 2+, PT 2+, No significant edema  Neurological: She is alert.  Skin: Skin is warm and dry. She is not diaphoretic.  Psychiatric: She has a normal mood and affect. Her behavior is normal.    Assessment/Plan: Left hip fx -- Will probably need THA vs hemi once INR corrects to <1.7, suspect Wednesday. Will make NPO after MN tonight in case she is ready tomorrow and I can find space and a Psychologist, sport and exercise. Bucks traction for comfort. Left knee pain -- Will check x-ray Afib on coumadin HTN Hyperlipidemia Hypothyroidism RLS    Lisette Abu, PA-C Orthopedic Surgery [phone] 08/25/2017, 11:16 AM

## 2017-08-25 NOTE — H&P (View-Only) (Signed)
Reason for Consult:Left hip fx Referring Physician: Thomasenia Bottoms  Jennifer House is an 72 y.o. female.  HPI: Jennifer House accidentally rolled out of bed onto the floor last night. She fell onto her left side and had immediate pain and could not get up. She was brought to the ED where x-rays showed a femoral neck fx and orthopedic surgery was consulted.   Past Medical History:  Diagnosis Date  . Anxiety   . Atrial fibrillation ([place])    longstanding persistent  . 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 Risa Grill  . Colon polyps   . Depression   . GERD (gastroesophageal reflux disease)   . Headache(784.0)    hx of migraines  . Hematuria    hx  . HLD (hyperlipidemia)    mixed  . HTN (hypertension)   . Hypothyroidism   . Insomnia   . Liver function test abnormality    hx fatty liver  . Murmur, cardiac   . Nonalcoholic fatty liver disease   . Obesity   . Osteoarthritis    low back pain with left sciatica  . Osteopenia    dexa 2013  . Renal calculus    hx  . Restless leg syndrome     Past Surgical History:  Procedure Laterality Date  . APPENDECTOMY    . CARDIAC CATHETERIZATION  1/12   revealed normal cors and LV function  . COLONOSCOPY    . cystocopy  10/17/03   left stent placement, TURBT  . FOOT SURGERY    . LUMBAR LAMINECTOMY/DECOMPRESSION MICRODISCECTOMY N/A 01/19/2013   Procedure: LUMBAR THREE FOUR LUMBAR LAMINECTOMY/DECOMPRESSION MICRODISCECTOMY 1 LEVEL;  Surgeon: Faythe Ghee, MD;  Location: [facility];  Service: Neurosurgery;  Laterality: N/A;  . TOTAL ABDOMINAL HYSTERECTOMY  1985    Family History  Problem Relation Age of Onset  . Heart failure Mother   . Heart attack Mother   . Hypertension Mother   . Thyroid cancer Mother   . Stroke Father   . Hypertension Father   . Stroke Brother   . Breast cancer Neg Hx   . Colon cancer Neg Hx     Social History:  reports that she quit smoking about 32 years ago. She has never used smokeless tobacco.  She reports that she does not drink alcohol or use drugs.  Allergies:  Allergies  Allergen Reactions  . Ibandronate Sodium Other (See Comments)    Bones hurt  . Pravachol [Pravastatin Sodium] Other (See Comments)    Myalgias   . Ultram [Tramadol Hcl]     nausea    Medications: I have reviewed the patient's current medications.  Results for orders placed or performed during the hospital encounter of 08/25/17 (from the past 48 hour(s))  CBC with Differential     Status: None   Collection Time: 08/25/17  8:35 AM  Result Value Ref Range   WBC 6.4 4.0 - 10.5 K/uL   RBC 4.82 3.87 - 5.11 MIL/uL   Hemoglobin 13.0 12.0 - 15.0 g/dL   HCT 41.3 36.0 - 46.0 %   MCV 85.7 78.0 - 100.0 fL   MCH 27.0 26.0 - 34.0 pg   MCHC 31.5 30.0 - 36.0 g/dL   RDW 14.4 11.5 - 15.5 %   Platelets 180 150 - 400 K/uL   Neutrophils Relative % 65 %   Neutro Abs 4.2 1.7 - 7.7 K/uL   Lymphocytes Relative 26 %   Lymphs Abs 1.7 0.7 - 4.0  K/uL   Monocytes Relative 5 %   Monocytes Absolute 0.3 0.1 - 1.0 K/uL   Eosinophils Relative 2 %   Eosinophils Absolute 0.1 0.0 - 0.7 K/uL   Basophils Relative 1 %   Basophils Absolute 0.1 0.0 - 0.1 K/uL   Immature Granulocytes 1 %   Abs Immature Granulocytes 0.0 0.0 - 0.1 K/uL    Comment: Performed at [facility] [address] [place], [place] [address]  Basic metabolic panel     Status: Abnormal   Collection Time: 08/25/17  8:35 AM  Result Value Ref Range   Sodium 140 135 - 145 mmol/L   Potassium 3.8 3.5 - 5.1 mmol/L   Chloride 108 101 - 111 mmol/L   CO2 25 22 - 32 mmol/L   Glucose, Bld 152 (H) 65 - 99 mg/dL   BUN 14 6 - 20 mg/dL   Creatinine, Ser 0.82 0.44 - 1.00 mg/dL   Calcium 9.1 8.9 - 10.3 mg/dL   GFR calc non Af Amer >60 >60 mL/min   GFR calc Af Amer >60 >60 mL/min    Comment: (NOTE) The eGFR has been calculated using the CKD EPI equation. This calculation has not been validated in all clinical situations. eGFR's persistently <60 mL/min signify  possible Chronic Kidney Disease.    Anion gap 7 5 - 15    Comment: Performed at [facility] [address] [place], [place] [address]  Protime-INR     Status: Abnormal   Collection Time: 08/25/17  8:35 AM  Result Value Ref Range   Prothrombin Time 27.6 (H) 11.4 - 15.2 seconds   INR 2.59     Comment: Performed at [facility] [address], [place], [place] [address]    Ct Head Wo Contrast  Result Date: 08/25/2017 CLINICAL DATA:  [place] out of bed today striking the left side of the face and head. Some headache. EXAM: CT HEAD WITHOUT CONTRAST TECHNIQUE: Contiguous axial images were obtained from the base of the skull through the vertex without intravenous contrast. COMPARISON:  None. FINDINGS: Brain: Brain atrophy. Extensive chronic small-vessel ischemic changes affecting the cerebral hemispheric deep and subcortical white matter. Small-vessel changes also of the brainstem, basal ganglia and thalami. No sign of acute infarction, mass lesion, hemorrhage, hydrocephalus or extra-axial collection. Vascular: There is atherosclerotic calcification of the major vessels at the base of the brain. Skull: No fracture. Sinuses/Orbits: Clear/normal Other: None IMPRESSION: No acute or traumatic finding. Brain atrophy with extensive chronic small-vessel ischemic changes throughout. Electronically Signed   By: Nelson Chimes M.D.   On: 08/25/2017 09:28   Dg Chest Port 1 View  Result Date: 08/25/2017 CLINICAL DATA:  Preop cardiovascular exam [phone], HTN, former smoker - quit 30 years ago EXAM: PORTABLE CHEST 1 VIEW COMPARISON:  Chest x-ray dated 08/29/2014. FINDINGS: Cardiomegaly. Aortic atherosclerosis. New small opacity within the LEFT lower lung. Lungs otherwise clear. No pleural effusion or pneumothorax seen. No acute or suspicious osseous finding. IMPRESSION: 1. New small ill-defined opacity within the LEFT lower lung. This is most likely focal scarring or atelectasis but chest CT is recommended  to ensure benignity given the smoking history. 2. Cardiomegaly. 3. Aortic atherosclerosis. Electronically Signed   By: Franki Cabot M.D.   On: 08/25/2017 11:01   Dg Hip Unilat W Or Wo Pelvis 2-3 Views Left  Result Date: 08/25/2017 CLINICAL DATA:  Left hip pain since the patient fell out of bed this morning. Initial encounter. EXAM: DG HIP (  WITH OR WITHOUT PELVIS) 2-3V LEFT COMPARISON:  None. FINDINGS: The patient has an acute fracture through the base of the left femoral neck. The femoral head is located. No other abnormality is identified. IMPRESSION: Acute basicervical fracture left hip. Electronically Signed   By: Inge Rise M.D.   On: 08/25/2017 09:10    Review of Systems  Constitutional: Negative for weight loss.  HENT: Negative for ear discharge, ear pain, hearing loss and tinnitus.   Eyes: Negative for blurred vision, double vision, photophobia and pain.  Respiratory: Negative for cough, sputum production and shortness of breath.   Cardiovascular: Negative for chest pain.  Gastrointestinal: Negative for abdominal pain, nausea and vomiting.  Genitourinary: Negative for dysuria, flank pain, frequency and urgency.  Musculoskeletal: Positive for joint pain (Left hip). Negative for back pain, falls, myalgias and neck pain.  Neurological: Negative for dizziness, tingling, sensory change, focal weakness, loss of consciousness and headaches.  Endo/Heme/Allergies: Does not bruise/bleed easily.  Psychiatric/Behavioral: Negative for depression, memory loss and substance abuse. The patient is not nervous/anxious.    Blood pressure 133/71, pulse 66, temperature 97.7 F (36.5 C), temperature source Oral, resp. rate 15, SpO2 95 %. Physical Exam  Constitutional: She appears well-developed and well-nourished. No distress.  HENT:  Head: Normocephalic and atraumatic.  Eyes: Conjunctivae are normal. Right eye exhibits no discharge. Left eye exhibits no discharge. No scleral icterus.  Neck:  Normal range of motion.  Cardiovascular: Normal rate and regular rhythm.  Respiratory: Effort normal. No respiratory distress.  Musculoskeletal:  LLE No traumatic wounds, ecchymosis, or rash  TTP hip, lateral knee  No knee or ankle effusion  Knee stable to varus/ valgus and anterior/posterior stress  Sens DPN, SPN, TN intact  Motor EHL, ext, flex, evers 5/5  DP 2+, PT 2+, No significant edema  Neurological: She is alert.  Skin: Skin is warm and dry. She is not diaphoretic.  Psychiatric: She has a normal mood and affect. Her behavior is normal.    Assessment/Plan: Left hip fx -- Will probably need THA vs hemi once INR corrects to <1.7, suspect Wednesday. Will make NPO after MN tonight in case she is ready tomorrow and I can find space and a Psychologist, sport and exercise. Bucks traction for comfort. Left knee pain -- Will check x-ray Afib on coumadin HTN Hyperlipidemia Hypothyroidism RLS    Lisette Abu, PA-C Orthopedic Surgery [phone] 08/25/2017, 11:16 AM

## 2017-08-25 NOTE — ED Triage Notes (Signed)
Per EMS: Pt from home with c/o rolling out of bed onto the floor.  Pt states no head, neck, or back pain. Notes left sided hip pain 7/10.  No obvious deformity. Pt has a hx of A-fib and is on warfarin. EMS administered 100 mcg of fentanyl pta and 4 of zofran.   BP 146/70 HR 68 A-Fib RR 18 Sp02 93% on RA

## 2017-08-25 NOTE — ED Provider Notes (Signed)
[facility] EMERGENCY DEPARTMENT Provider Note   CSN: [id number] Arrival date & time: 08/25/17  0737     History   Chief Complaint Chief Complaint  Patient presents with  . Fall    HPI Jennifer House is a 72 y.o. female.  The history is provided by the patient and medical records. No language interpreter was used.  Fall    Jennifer House is a 72 y.o. female  with a PMH of on warfarin who presents to the Emergency Department for evaluation after fall just prior to arrival.  Patient states that she rolled out of her bed and landed on her left hip.  She also struck her head on the nightstand.  She denies any loss of consciousness.  No headache, nausea or vomiting.  She endorses severe pain to her left hip which is worse with any movement.  She has not tried to ambulate on the hip.  She was given 100 mcg of fentanyl and 4 mg of Zofran in route by EMS.  She does report improvement in her pain with medication administration.  Denies any neck or back pain.  No numbness, tingling or weakness.  Denies any prior orthopedic injuries.  Past Medical History:  Diagnosis Date  . Anxiety   . Atrial fibrillation ([place])    longstanding persistent  . 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 Risa Grill  . Colon polyps   . Depression   . GERD (gastroesophageal reflux disease)   . Headache(784.0)    hx of migraines  . Hematuria    hx  . HLD (hyperlipidemia)    mixed  . HTN (hypertension)   . Hypothyroidism   . Insomnia   . Liver function test abnormality    hx fatty liver  . Murmur, cardiac   . Nonalcoholic fatty liver disease   . Obesity   . Osteoarthritis    low back pain with left sciatica  . Osteopenia    dexa 2013  . Renal calculus    hx  . Restless leg syndrome     Patient Active Problem List   Diagnosis Date Noted  . Hip fracture ([place]) 08/25/2017  . Medicare annual wellness visit, subsequent 08/25/2015  . Hyperlipidemia LDL goal <100  08/25/2015  . Vitamin D deficiency 08/25/2015  . Hypertriglyceridemia 08/25/2015  . History of bladder cancer 08/25/2015  . Long term (current) use of anticoagulants 06/25/2010  . Atrial fibrillation ([place]) 06/28/2009  . Hypothyroidism 01/31/2009  . Depression 01/31/2009  . Essential hypertension, benign 01/31/2009  . Arthritis 01/31/2009  . Osteopenia 01/31/2009  . Insomnia 01/31/2009  . RENAL CALCULUS, HX OF 01/31/2009    Past Surgical History:  Procedure Laterality Date  . APPENDECTOMY    . CARDIAC CATHETERIZATION  1/12   revealed normal cors and LV function  . COLONOSCOPY    . cystocopy  10/17/03   left stent placement, TURBT  . FOOT SURGERY    . LUMBAR LAMINECTOMY/DECOMPRESSION MICRODISCECTOMY N/A 01/19/2013   Procedure: LUMBAR THREE FOUR LUMBAR LAMINECTOMY/DECOMPRESSION MICRODISCECTOMY 1 LEVEL;  Surgeon: Faythe Ghee, MD;  Location: [facility];  Service: Neurosurgery;  Laterality: N/A;  . TOTAL ABDOMINAL HYSTERECTOMY  1985     OB History   None      Home Medications    Prior to Admission medications   Medication Sig Start Date End Date Taking? Authorizing Provider  amLODipine (NORVASC) 10 MG tablet Take 1 tablet (10 mg total) by mouth daily.  03/26/17  Yes Kuneff, Renee A, DO  benazepril (LOTENSIN) 20 MG tablet Take 1 tablet (20 mg total) by mouth daily. Needs office visit prior to anymore refills. 03/26/17  Yes Kuneff, Renee A, DO  Calcium Carbonate (CALTRATE 600) 1500 MG TABS Take 3 tablets by mouth daily.     Yes [provider]  cholecalciferol (VITAMIN D) 1000 UNITS tablet Take 1,000 Units by mouth daily.     Yes [provider]  escitalopram (LEXAPRO) 20 MG tablet Take 1 tablet (20 mg total) by mouth daily. 03/26/17  Yes Kuneff, Renee A, DO  ezetimibe (ZETIA) 10 MG tablet Take 1 tablet (10 mg total) by mouth daily. 03/26/17  Yes Kuneff, Renee A, DO  furosemide (LASIX) 20 MG tablet TAKE 1 TABLET BY MOUTH EVERY MONDAY, WEDNESDAY AND FRIDAY 12/06/16  Yes  Kuneff, Renee A, DO  hydrOXYzine (VISTARIL) 25 MG capsule Take 1 capsule (25 mg total) by mouth at bedtime. 06/02/17  Yes Kuneff, Renee A, DO  levothyroxine (SYNTHROID, LEVOTHROID) 75 MCG tablet Take 1 tablet (75 mcg total) by mouth daily. 03/26/17  Yes Kuneff, Renee A, DO  metoprolol tartrate (LOPRESSOR) 50 MG tablet Take 0.5 tablets (25 mg total) by mouth 2 (two) times daily. 03/26/17  Yes Kuneff, Renee A, DO  Multiple Vitamins-Minerals (CENTRUM SILVER) tablet Take 1 tablet by mouth daily.     Yes [provider]  nitroGLYCERIN (NITROSTAT) 0.4 MG SL tablet PLACE 1 TABLET UNDER THE TONGUE EVERY 5 MINUTES AS NEEDED FOR CHEST PAIN,(MAX 3 TABLETS) 05/23/15  Yes Kuneff, Renee A, DO  traZODone (DESYREL) 50 MG tablet TAKE 1 TO 2 TABLETS(50 TO 100 MG) BY MOUTH AT BEDTIME AS NEEDED FOR SLEEP Patient taking differently: Take 100 mg by mouth at bedtime.  03/26/17  Yes Kuneff, Renee A, DO  warfarin (COUMADIN) 5 MG tablet TAKE AS DIRECTED BY COUMADIN CLINIC Patient taking differently: Take 7.5 mg on Sunday. Take 5 mg Monday, Tuesday, Wednesday, Thursday, Friday and Saturday at 1900 05/28/17  Yes Allred, Jeneen Rinks, MD  fluocinolone (VANOS) 0.01 % cream Apply topically 2 (two) times daily. Patient not taking: Reported on 08/25/2017 06/02/17   Ma Hillock, DO    Family History Family History  Problem Relation Age of Onset  . Heart failure Mother   . Heart attack Mother   . Hypertension Mother   . Thyroid cancer Mother   . Stroke Father   . Hypertension Father   . Stroke Brother   . Breast cancer Neg Hx   . Colon cancer Neg Hx     Social History Social History   Tobacco Use  . Smoking status: Former Smoker    Last attempt to quit: 03/18/1985    Years since quitting: 32.4  . Smokeless tobacco: Never Used  . Tobacco comment: denies   Substance Use Topics  . Alcohol use: No  . Drug use: No     Allergies   Ibandronate sodium; Pravachol [pravastatin sodium]; and Ultram [tramadol hcl]   Review  of Systems Review of Systems  Musculoskeletal: Positive for arthralgias and myalgias.  Skin: Negative for color change and wound.  All other systems reviewed and are negative.    Physical Exam Updated Vital Signs BP (!) 144/76   Pulse 64   Temp 97.7 F (36.5 C) (Oral)   Resp 13   SpO2 96%   Physical Exam  Constitutional: She is oriented to person, place, and time. She appears well-developed and well-nourished. No distress.  HENT:  Head: Normocephalic.  Nose: Nose normal.  Atraumatic.  No battle sign, raccoon eyes.  No hemotympanum.  Cardiovascular: Normal rate, regular rhythm and normal heart sounds.  No murmur heard. Pulmonary/Chest: Effort normal and breath sounds normal. No respiratory distress.  Abdominal: Soft. She exhibits no distension. There is no tenderness.  Musculoskeletal:  Tenderness to palpation of the left hip without overlying skin changes.  She also has tenderness to the left groin.  Pain is exacerbated with logroll movement and flexion. 2+ DP pulses bilaterally.  Neurological: She is alert and oriented to person, place, and time.  Alert, oriented, thought content appropriate, able to give a coherent history. Speech is clear and goal oriented, able to follow commands.  Cranial Nerves:  II:  Peripheral visual fields grossly normal, pupils equal, round, reactive to light III, IV, VI: EOM intact bilaterally, ptosis not present V,VII: smile symmetric, eyes kept closed tightly against resistance, facial light touch sensation equal VIII: hearing grossly normal IX, X: symmetric soft palate movement, uvula elevates symmetrically  XI: bilateral shoulder shrug symmetric and strong XII: midline tongue extension Sensory to light touch normal in all four extremities.  Normal finger-to-nose and rapid alternating movements;  Skin: Skin is warm and dry.  Nursing note and vitals reviewed.    ED Treatments / Results  Labs (all labs ordered are listed, but only abnormal  results are displayed) Labs Reviewed  BASIC METABOLIC PANEL - Abnormal; Notable for the following components:      Result Value   Glucose, Bld 152 (*)    All other components within normal limits  PROTIME-INR - Abnormal; Notable for the following components:   Prothrombin Time 27.6 (*)    All other components within normal limits  CBC WITH DIFFERENTIAL/PLATELET    EKG None  Radiology Ct Head Wo Contrast  Result Date: 08/25/2017 CLINICAL DATA:  [place] out of bed today striking the left side of the face and head. Some headache. EXAM: CT HEAD WITHOUT CONTRAST TECHNIQUE: Contiguous axial images were obtained from the base of the skull through the vertex without intravenous contrast. COMPARISON:  None. FINDINGS: Brain: Brain atrophy. Extensive chronic small-vessel ischemic changes affecting the cerebral hemispheric deep and subcortical white matter. Small-vessel changes also of the brainstem, basal ganglia and thalami. No sign of acute infarction, mass lesion, hemorrhage, hydrocephalus or extra-axial collection. Vascular: There is atherosclerotic calcification of the major vessels at the base of the brain. Skull: No fracture. Sinuses/Orbits: Clear/normal Other: None IMPRESSION: No acute or traumatic finding. Brain atrophy with extensive chronic small-vessel ischemic changes throughout. Electronically Signed   By: Nelson Chimes M.D.   On: 08/25/2017 09:28   Dg Hip Unilat W Or Wo Pelvis 2-3 Views Left  Result Date: 08/25/2017 CLINICAL DATA:  Left hip pain since the patient fell out of bed this morning. Initial encounter. EXAM: DG HIP (WITH OR WITHOUT PELVIS) 2-3V LEFT COMPARISON:  None. FINDINGS: The patient has an acute fracture through the base of the left femoral neck. The femoral head is located. No other abnormality is identified. IMPRESSION: Acute basicervical fracture left hip. Electronically Signed   By: Inge Rise M.D.   On: 08/25/2017 09:10    Procedures Procedures (including critical  care time)  Medications Ordered in ED Medications  morphine 4 MG/ML injection 4 mg (has no administration in time range)  fentaNYL (SUBLIMAZE) injection 50 mcg (50 mcg Intravenous Given 08/25/17 0841)     Initial Impression / Assessment and Plan / ED Course  I have reviewed the triage vital signs  and the nursing notes.  Pertinent labs & imaging results that were available during my care of the patient were reviewed by me and considered in my medical decision making (see chart for details).    Jennifer House is a 72 y.o. female who presents to ED for evaluation after rolling out of the bed this morning, falling on her left hip.  She is on warfarin and did strike her head.  No focal neuro deficits on exam.  CT head without any acute findings.  Left hip externally rotated with tenderness to groin and lateral side of the hip concerning for fracture.  X-ray was obtained showing acute femoral neck fracture.  INR today of 2.59.  Orthopedics, PA Dellis Filbert, consulted who will evaluate patient.  Recommend medical admission.  Hospitalist consulted who will admit.  Patient seen by and discussed with Dr. Lacinda Axon who agrees with treatment plan.    Final Clinical Impressions(s) / ED Diagnoses   Final diagnoses:  Closed fracture of neck of left femur, initial encounter [facility])    ED Discharge Orders    None       Vernica Wachtel, Ozella Almond, PA-C 08/25/17 1027    Nat Christen, MD 08/27/17 1041
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
## 2017-08-25 NOTE — Progress Notes (Signed)
ANTICOAGULATION CONSULT NOTE - Initial Consult  Pharmacy Consult for heparin Indication: atrial fibrillation  Allergies  Allergen Reactions  . Ibandronate Sodium Other (See Comments)    Bones hurt  . Pravachol [Pravastatin Sodium] Other (See Comments)    Myalgias   . Ultram [Tramadol Hcl]     nausea   Patient Measurements:   Heparin Dosing Weight: 77 kg  Vital Signs: Temp: 97.7 F (36.5 C) (06/10 0828) Temp Source: Oral (06/10 0828) BP: 133/71 (06/10 1030) Pulse Rate: 66 (06/10 1045)  Labs: Recent Labs    08/25/17 0835  HGB 13.0  HCT 41.3  PLT 180  LABPROT 27.6*  INR 2.59  CREATININE 0.82    Estimated Creatinine Clearance: 68.7 mL/min (by C-G formula based on SCr of 0.82 mg/dL).   Medical History: Past Medical History:  Diagnosis Date  . Anxiety   . Atrial fibrillation ([place])    longstanding persistent  . 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 Risa Grill  . Colon polyps   . Depression   . GERD (gastroesophageal reflux disease)   . Headache(784.0)    hx of migraines  . Hematuria    hx  . HLD (hyperlipidemia)    mixed  . HTN (hypertension)   . Hypothyroidism   . Insomnia   . Liver function test abnormality    hx fatty liver  . Murmur, cardiac   . Nonalcoholic fatty liver disease   . Obesity   . Osteoarthritis    low back pain with left sciatica  . Osteopenia    dexa 2013  . Renal calculus    hx  . Restless leg syndrome      Assessment: 73 yoF on warfarin PTA for atrial fibrillation admitted after falling out of bed. INR on admit therapeutic at 2.59, CBC wnl. Pharmacy consulted to start IV heparin once INR < 2. CT head negative for acute findings.  Goal of Therapy:  INR 2-3 Heparin level 0.3-0.7 units/ml Monitor platelets by anticoagulation protocol: Yes   Plan:  -Hold heparin tonight, daily INR -Once INR < 2 - initiate heparin infusion  Arrie Senate, PharmD, BCPS PGY-2 Cardiology Pharmacy Resident Phone:  [phone] 08/25/2017

## 2017-08-25 NOTE — ED Notes (Signed)
Family at bedside. Pt returned from imaging.

## 2017-08-25 NOTE — ED Notes (Signed)
Admitting provider at bedside.

## 2017-08-26 ENCOUNTER — Inpatient Hospital Stay (HOSPITAL_COMMUNITY): Payer: Medicare Other

## 2017-08-26 DIAGNOSIS — I482 Chronic atrial fibrillation: Secondary | ICD-10-CM

## 2017-08-26 DIAGNOSIS — I10 Essential (primary) hypertension: Secondary | ICD-10-CM

## 2017-08-26 LAB — ECHOCARDIOGRAM COMPLETE

## 2017-08-26 LAB — PROTIME-INR
INR: 3.01
PROTHROMBIN TIME: 30.9 s — AB (ref 11.4–15.2)

## 2017-08-26 MED ORDER — PHYTONADIONE 5 MG PO TABS
5.0000 mg | ORAL_TABLET | Freq: Once | ORAL | Status: AC
  Administered 2017-08-26: 5 mg via ORAL
  Filled 2017-08-26: qty 1

## 2017-08-26 NOTE — Progress Notes (Addendum)
PROGRESS NOTE    Jennifer House  PYP:[id number] DOB: March 04, 1946 DOA: 08/25/2017 PCP: Ma Hillock, DO  Brief Narrative: Jennifer House is a 72 y.o. female with medical history significant of RLS; obesity; NASH; hypothyroidism; HTN; HLD; bladder CA; and chronic afib on Coumadin presenting with fall.  She just "fell out of bed."  She thinks maybe she rolled over when she was too close to the edge.  She was not able to get up.  Her last dose of Coumadin was last night - takes 1 1/2 tabs on Sunday and 1 tab the rest of the week.     ED Course:   Rolled out of bed, INR 2.59 on Coumadin, femoral neck fracture.      Assessment & Plan:   Principal Problem:   Hip fracture ([place]) Active Problems:   Hypothyroidism   Essential hypertension, benign   Atrial fibrillation (H[facility])   Long term (current) use of anticoagulants   Hyperlipidemia LDL goal <100   1-Left Hip fracture;  Ortho consulted.  Check ECHO pre op.  INR still elevated, will give vitamin K.   2-A fib on coumadin.  Continue to hold coumadin.  Will give vitamin K.  Repeat INR in am.   3-HTN;  Continue with Norvasc. Hold ACE.   4-Hypothyroidism; continue with synthroid.   5-Left lung opacity; radiologist recommend CT chest.  CT chest ordered.      DVT prophylaxis: needs to probably resume Coumadin post op  Code Status: full code.  Family Communication: family was at bedside.  Disposition Plan: to be determine, might need rehab after sx  Consultants:  Ortho   Procedures; none  Antimicrobials: none  Subjective: She denies chest pain, report some time dyspnea on exertion.    Objective: Vitals:   08/25/17 1115 08/25/17 1155 08/25/17 2145 08/26/17 0424  BP: 138/69 131/68 132/76 (!) 121/57  Pulse: 78 70 74 70  Resp: 14  16 17   Temp:  (!) 97.4 F (36.3 C) 98.6 F (37 C) 98.2 F (36.8 C)  TempSrc:  Oral Oral Oral  SpO2: 95% 95% 94% 93%    Intake/Output Summary (Last 24 hours) at 08/26/2017 0844 Last  data filed at 08/26/2017 0425 Gross per 24 hour  Intake -  Output 1300 ml  Net -1300 ml   There were no vitals filed for this visit.  Examination:  General exam: Appears calm and comfortable  Respiratory system: Clear to auscultation. Respiratory effort normal. Cardiovascular system: S1 & S2 heard, RRR. No JVD, murmurs, rubs, gallops or clicks. No pedal edema. Gastrointestinal system: Abdomen is nondistended, soft and nontender. No organomegaly or masses felt. Normal bowel sounds heard. Central nervous system: Alert and oriented.  Extremities: no edema, left LE on Bucks traction.  Skin: No rashes, lesions or ulcers Psychiatry: Judgement and insight appear normal. Mood & affect appropriate.     Data Reviewed: I have personally reviewed following labs and imaging studies  CBC: Recent Labs  Lab 08/25/17 0835  WBC 6.4  NEUTROABS 4.2  HGB 13.0  HCT 41.3  MCV 85.7  PLT 245   Basic Metabolic Panel: Recent Labs  Lab 08/25/17 0835  NA 140  K 3.8  CL 108  CO2 25  GLUCOSE 152*  BUN 14  CREATININE 0.82  CALCIUM 9.1   GFR: Estimated Creatinine Clearance: 68.7 mL/min (by C-G formula based on SCr of 0.82 mg/dL). Liver Function Tests: No results for input(s): AST, ALT, ALKPHOS, BILITOT, PROT, ALBUMIN in the last 168 hours.  No results for input(s): LIPASE, AMYLASE in the last 168 hours. No results for input(s): AMMONIA in the last 168 hours. Coagulation Profile: Recent Labs  Lab 08/25/17 0835 08/26/17 0443  INR 2.59 3.01   Cardiac Enzymes: No results for input(s): CKTOTAL, CKMB, CKMBINDEX, TROPONINI in the last 168 hours. BNP (last 3 results) No results for input(s): PROBNP in the last 8760 hours. HbA1C: No results for input(s): HGBA1C in the last 72 hours. CBG: No results for input(s): GLUCAP in the last 168 hours. Lipid Profile: No results for input(s): CHOL, HDL, LDLCALC, TRIG, CHOLHDL, LDLDIRECT in the last 72 hours. Thyroid Function Tests: Recent Labs     08/25/17 1900  TSH 0.804   Anemia Panel: No results for input(s): VITAMINB12, FOLATE, FERRITIN, TIBC, IRON, RETICCTPCT in the last 72 hours. Sepsis Labs: No results for input(s): PROCALCITON, LATICACIDVEN in the last 168 hours.  No results found for this or any previous visit (from the past 240 hour(s)).       Radiology Studies: Ct Head Wo Contrast  Result Date: 08/25/2017 CLINICAL DATA:  [place] out of bed today striking the left side of the face and head. Some headache. EXAM: CT HEAD WITHOUT CONTRAST TECHNIQUE: Contiguous axial images were obtained from the base of the skull through the vertex without intravenous contrast. COMPARISON:  None. FINDINGS: Brain: Brain atrophy. Extensive chronic small-vessel ischemic changes affecting the cerebral hemispheric deep and subcortical white matter. Small-vessel changes also of the brainstem, basal ganglia and thalami. No sign of acute infarction, mass lesion, hemorrhage, hydrocephalus or extra-axial collection. Vascular: There is atherosclerotic calcification of the major vessels at the base of the brain. Skull: No fracture. Sinuses/Orbits: Clear/normal Other: None IMPRESSION: No acute or traumatic finding. Brain atrophy with extensive chronic small-vessel ischemic changes throughout. Electronically Signed   By: Nelson Chimes M.D.   On: 08/25/2017 09:28   Dg Chest Port 1 View  Result Date: 08/25/2017 CLINICAL DATA:  Preop cardiovascular exam [phone], HTN, former smoker - quit 30 years ago EXAM: PORTABLE CHEST 1 VIEW COMPARISON:  Chest x-ray dated 08/29/2014. FINDINGS: Cardiomegaly. Aortic atherosclerosis. New small opacity within the LEFT lower lung. Lungs otherwise clear. No pleural effusion or pneumothorax seen. No acute or suspicious osseous finding. IMPRESSION: 1. New small ill-defined opacity within the LEFT lower lung. This is most likely focal scarring or atelectasis but chest CT is recommended to ensure benignity given the smoking history. 2.  Cardiomegaly. 3. Aortic atherosclerosis. Electronically Signed   By: Franki Cabot M.D.   On: 08/25/2017 11:01   Dg Knee Left Port  Result Date: 08/25/2017 CLINICAL DATA:  72 year old female with a history of fall and left knee pain EXAM: PORTABLE LEFT KNEE - 1-2 VIEW COMPARISON:  None. FINDINGS: No acute displaced fracture. Medial greater than lateral joint space narrowing. Marginal osteophyte formation. No joint effusion. Degenerative changes of the patellofemoral joint. IMPRESSION: No acute bony abnormality identified. Tricompartmental osteoarthritis Electronically Signed   By: Corrie Mckusick D.O.   On: 08/25/2017 13:40   Dg Hip Unilat W Or Wo Pelvis 2-3 Views Left  Result Date: 08/25/2017 CLINICAL DATA:  Left hip pain since the patient fell out of bed this morning. Initial encounter. EXAM: DG HIP (WITH OR WITHOUT PELVIS) 2-3V LEFT COMPARISON:  None. FINDINGS: The patient has an acute fracture through the base of the left femoral neck. The femoral head is located. No other abnormality is identified. IMPRESSION: Acute basicervical fracture left hip. Electronically Signed   By: Inge Rise M.D.  On: 08/25/2017 09:10        Scheduled Meds: . amLODipine  10 mg Oral Daily  . benazepril  20 mg Oral Daily  . docusate sodium  100 mg Oral BID  . escitalopram  20 mg Oral Daily  . ezetimibe  10 mg Oral Daily  . hydrOXYzine  25 mg Oral QHS  . levothyroxine  75 mcg Oral QPC breakfast  . metoprolol tartrate  25 mg Oral BID  . traZODone  100 mg Oral QHS   Continuous Infusions: . methocarbamol (ROBAXIN)  IV       LOS: 1 day    Time spent: 35 minutes.     Elmarie Shiley, MD [facility] Pager [phone]  If 7PM-7AM, please contact night-coverage [email or web address] Password [facility] 08/26/2017, 8:44 AM

## 2017-08-26 NOTE — Progress Notes (Signed)
Nutrition Brief Note  RD consulted as part of the hip fracture protocol.  Spoke with pt at bedside who reports having a good appetite and no recent changes in appetite. Pt states that she consumes 2 meals and 1 snack daily. Lunch and dinner include a starch (potato, rice, pasta), a meat, and vegetables. Pt snacks on pretzels, pig skins, and yogurt.  Pt reports that her UBW is 204 lbs. Pt denies any recent unintentional weight loss, stating that "I have been trying to lose weight."  Wt Readings from Last 15 Encounters:  08/18/17 204 lb (92.5 kg)  06/02/17 205 lb (93 kg)  04/30/17 197 lb 12 oz (89.7 kg)  03/26/17 201 lb 12.8 oz (91.5 kg)  09/19/16 195 lb (88.5 kg)  07/08/16 193 lb 4 oz (87.7 kg)  03/21/16 192 lb 4 oz (87.2 kg)  08/25/15 190 lb 8 oz (86.4 kg)  05/23/15 198 lb (89.8 kg)  02/22/15 204 lb 4 oz (92.6 kg)  01/23/15 191 lb (86.6 kg)  08/29/14 200 lb (90.7 kg)  08/16/14 198 lb (89.8 kg)  08/01/14 201 lb 6.4 oz (91.4 kg)  01/18/13 202 lb 6.4 oz (91.8 kg)    Current BMI is 33.89 kg/m^2 calculated using height and weight from 1 week ago. Patient meets criteria for Obesity Class I based on current BMI.   Current diet order is Regular, patient is consuming approximately 100% of meals at this time. RD witnessed completed lunch meal tray in room at time of visit. Labs and medications reviewed.   No nutrition interventions warranted at this time. Pt and family with no nutrition-related questions or concerns. If nutrition issues arise, please re-consult RD.   Gaynell Face, MS, RD, LDN Pager: [phone] Weekend/After Hours: [phone]

## 2017-08-26 NOTE — Plan of Care (Signed)
  Problem: Activity: Goal: Ability to return to normal activity level will improve to the fullest extent possible by discharge Outcome: Progressing   

## 2017-08-26 NOTE — Progress Notes (Signed)
Patient ID: Jennifer House, female   DOB: 04-Apr-1945, 72 y.o.   MRN: [id number] Subjective: Fairly comfortable, no events In Bucks traction     Patient reports pain as mild to moderate depending on movement of left hip  Objective:   VITALS:   Vitals:   08/25/17 2145 08/26/17 0424  BP: 132/76 (!) 121/57  Pulse: 74 70  Resp: 16 17  Temp: 98.6 F (37 C) 98.2 F (36.8 C)  SpO2: 94% 93%    Neurovascular intact, LLE  LABS Recent Labs    08/25/17 0835  HGB 13.0  HCT 41.3  WBC 6.4  PLT 180    Recent Labs    08/25/17 0835  NA 140  K 3.8  BUN 14  CREATININE 0.82  GLUCOSE 152*    Recent Labs    08/25/17 0835 08/26/17 0443  INR 2.59 3.01     Assessment/Plan: Left femoral neck fracture on Coumadin for anticoagulation   Plan: Continue to monitor INR (3.01 today) Will try and find assistance in management if I am unable once INR below 1.5-1.7 range Bucks traction for comfort

## 2017-08-27 ENCOUNTER — Encounter (HOSPITAL_COMMUNITY): Payer: Self-pay | Admitting: *Deleted

## 2017-08-27 DIAGNOSIS — S72002D Fracture of unspecified part of neck of left femur, subsequent encounter for closed fracture with routine healing: Secondary | ICD-10-CM

## 2017-08-27 LAB — CBC
HCT: 42 % (ref 36.0–46.0)
Hemoglobin: 12.9 g/dL (ref 12.0–15.0)
MCH: 26.3 pg (ref 26.0–34.0)
MCHC: 30.7 g/dL (ref 30.0–36.0)
MCV: 85.5 fL (ref 78.0–100.0)
Platelets: 166 10*3/uL (ref 150–400)
RBC: 4.91 MIL/uL (ref 3.87–5.11)
RDW: 14.6 % (ref 11.5–15.5)
WBC: 7.2 10*3/uL (ref 4.0–10.5)

## 2017-08-27 LAB — PROTIME-INR
INR: >10
INR: 1.31
Prothrombin Time: >90 seconds — ABNORMAL HIGH (ref 11.4–15.2)
Prothrombin Time: 16.1 seconds — ABNORMAL HIGH (ref 11.4–15.2)

## 2017-08-27 LAB — BASIC METABOLIC PANEL
ANION GAP: 8 (ref 5–15)
BUN: 13 mg/dL (ref 6–20)
CALCIUM: 9 mg/dL (ref 8.9–10.3)
CO2: 26 mmol/L (ref 22–32)
CREATININE: 0.81 mg/dL (ref 0.44–1.00)
Chloride: 106 mmol/L (ref 101–111)
GFR calc Af Amer: >60 mL/min (ref >60)
GFR calc non Af Amer: >60 mL/min (ref >60)
GLUCOSE: 96 mg/dL (ref 65–99)
Potassium: 3.9 mmol/L (ref 3.5–5.1)
Sodium: 140 mmol/L (ref 135–145)

## 2017-08-27 LAB — HEPARIN LEVEL (UNFRACTIONATED): Heparin Unfractionated: 0.12 IU/mL — ABNORMAL LOW (ref 0.30–0.70)

## 2017-08-27 LAB — MRSA PCR SCREENING: MRSA by PCR: NEGATIVE

## 2017-08-27 MED ORDER — HEPARIN (PORCINE) IN NACL 100-0.45 UNIT/ML-% IJ SOLN
1100.0000 [IU]/h | INTRAMUSCULAR | Status: DC
  Filled 2017-08-27: qty 250

## 2017-08-27 MED ORDER — CHLORHEXIDINE GLUCONATE 4 % EX LIQD: 60.0000 mL | Freq: Once | CUTANEOUS | Status: DC

## 2017-08-27 MED ORDER — POVIDONE-IODINE 10 % EX SWAB: 2.0000 "application " | Freq: Once | CUTANEOUS | Status: DC

## 2017-08-27 MED ORDER — ACETAMINOPHEN 10 MG/ML IV SOLN
1000.0000 mg | INTRAVENOUS | Status: AC
  Administered 2017-08-28: 1000 mg via INTRAVENOUS
  Filled 2017-08-27: qty 100

## 2017-08-27 MED ORDER — TRANEXAMIC ACID 1000 MG/10ML IV SOLN
1000.0000 mg | INTRAVENOUS | Status: AC
  Administered 2017-08-28: 1000 mg via INTRAVENOUS
  Filled 2017-08-27: qty 1100

## 2017-08-27 MED ORDER — HEPARIN (PORCINE) IN NACL 100-0.45 UNIT/ML-% IJ SOLN
1100.0000 [IU]/h | INTRAMUSCULAR | Status: DC
  Administered 2017-08-27: 1100 [IU]/h via INTRAVENOUS
  Filled 2017-08-27: qty 250

## 2017-08-27 NOTE — Progress Notes (Addendum)
[place] for heparin Indication: atrial fibrillation  Allergies  Allergen Reactions  . Ibandronate Sodium Other (See Comments)    Bones hurt  . Pravachol [Pravastatin Sodium] Other (See Comments)    Myalgias   . Ultram [Tramadol Hcl]     nausea   Patient Measurements:   Heparin Dosing Weight: 77 kg  Vital Signs: Temp: 97.9 F (36.6 C) (06/12 0509) Temp Source: Oral (06/12 0509) BP: 115/66 (06/12 0509) Pulse Rate: 74 (06/12 0509)  Labs: Recent Labs    08/25/17 0835 08/26/17 0443 08/27/17 0724  HGB 13.0  --  12.9  HCT 41.3  --  42.0  PLT 180  --  166  LABPROT 27.6* 30.9* >90.0*  INR 2.59 3.01 >10.00*  CREATININE 0.82  --  0.81    Estimated Creatinine Clearance: 69.5 mL/min (by C-G formula based on SCr of 0.81 mg/dL).   Assessment: 13 yoF on warfarin PTA for atrial fibrillation admitted after falling out of bed. Pharmacy consulted to start IV heparin once INR < 2. CT head negative for acute findings.  INR yesterday was 3.01- given vitamin K 5mg  PO x1. INR today is >10 which does not fit the clinical picture.   Goal of Therapy:  INR 2-3 Heparin level 0.3-0.7 units/ml Monitor platelets by anticoagulation protocol: Yes   Plan:  Redraw stat INR Heparin orders to follow if necessary  Alise Calais D. Margaretta Chittum, PharmD, BCPS Clinical Pharmacist [phone] Please check AMION for all [place] numbers 08/27/2017 10:05 AM    ADDENDUM Repeat INR 1.3 > will start heparin  Plan: Start heparin at 1100 units/hr Heparin level in 6h Daily heparin level and CBC Follow for surgical plans and s/s bleeding  Haaris Metallo D. Finnis Colee, PharmD, BCPS Clinical Pharmacist [phone] Please check AMION for all [place] numbers 08/27/2017 12:29 PM

## 2017-08-27 NOTE — Progress Notes (Addendum)
PROGRESS NOTE   Jennifer House  DXI:[id number]    DOB: May 10, 1945    DOA: 08/25/2017  PCP: Ma Hillock, DO   I have briefly reviewed patients previous medical records in [facility].  Brief Narrative:  72 year old married female, independent of activities, PMH of permanent A. fib on Coumadin (Dr. Rayann Heman, EP Cardiology), HTN, HLD, hypothyroid, fatty liver, anxiety and depression, GERD, obesity, fell out of her bed while asleep and presented with left hip pain/fracture.  Anticoagulated on admission.  Orthopedics consulted.  Anticoagulation reversed with vitamin K.  Plan for surgery on 08/28/2017.   Assessment & Plan:   Principal Problem:   Hip fracture ([place]) Active Problems:   Hypothyroidism   Essential hypertension, benign   Atrial fibrillation (HCC)   Long term (current) use of anticoagulants   Hyperlipidemia LDL goal <100   Left femoral neck fracture - Sustained post mechanical fall from bed while asleep at home. - Orthopedics was consulted and surgery was delayed while Coumadin anticoagulation was being reversed.  INR today is 1.3.  Orthopedics plans surgical fixation on 6/13. - Based on available data, patient is at moderate risk for perioperative CV events but may proceed with close perioperative monitoring.  Continue perioperative beta-blockers. - Patient reports history of "difficulty to arouse" from anesthesia during prior back surgery almost 4 years ago.  Consider spinal anesthesia if possible.  Strict I&O's, keep fluid status even, minimize and keep perioperative sedation/opioids as much as possible, incentive spirometry encouraged.  Permanent A. fib - Rate controlled. - INR 2.59 on admission and 3.01 on 6/11.  Status post vitamin K 5 mg orally x1 on 6/11.  INR this morning >10 not in keeping with clinical picture and unable to explain, hence repeated INR, discussed with lab, repeat INR 1.31. - IV heparin per pharmacy which can be held prior to surgery and then  resume Coumadin postop when okay with orthopedics. - I discussed with patient's EP cardiology service and made them aware of patient's hospital admission and planned surgery. - Patient last seen by her cardiologist on 08/18/2017.   - TTE 08/26/2017: LVEF 60-65%, trivial AR. - CHA2DS2 Vasc score: 3  Essential hypertension - Controlled.  Continue amlodipine and metoprolol.  ACEI held.  Hyperlipidemia - Continue Zetia.  Hypothyroid:  - Clinically euthyroid.  Anxiety and depression - Stable.  Continue Lexapro.  Obesity  Left lung opacity - Noted on chest x-ray.  Followed up with CT chest without contrast which showed no lung masses or significant pulmonary nodules.  Scarring versus atelectasis left lower lung area coronary atherosclerosis noted.  Incentive spirometry.    DVT prophylaxis: Currently on IV heparin drip. Code Status: Full Family Communication: Discussed in detail with patient's 2 daughters, spouse and another female family member at bedside. Disposition: To be determined postop, may need SNF.   Consultants:  Orthopedics.  Procedures:  Left lower extremity traction  Antimicrobials:  None   Subjective: Patient was interviewed along with spouse, 2 daughters and RN at bedside.  Lives in single level home with family and independent prior to admission.  Physically active only at home.  Does not exercise regularly.  Chronic dyspnea on moderate exertion without chest pain.  Occasional dizziness with activity but no palpitations.  None of the symptoms are new.  Left hip pain controlled with medications.  ROS: As above.  Patient and family report that she was difficult to arouse after anesthesia during prior surgery 4 years ago.  Objective:  Vitals:   08/26/17  1433 08/26/17 1954 08/26/17 2203 08/27/17 0509  BP: 129/72 122/73  115/66  Pulse: 82 86  74  Resp: 16 18  18   Temp: 98.6 F (37 C) 98.9 F (37.2 C)  97.9 F (36.6 C)  TempSrc: Oral Oral  Oral  SpO2: 92% (!)  89% 93% 93%    Examination:  General exam: Pleasant elderly female, moderately built and obese, lying comfortably propped up in bed.  Oral mucosa moist. Respiratory system: Clear to auscultation. Respiratory effort normal. Cardiovascular system: S1 & S2 heard, irregularly irregular. No JVD, murmurs, rubs, gallops or clicks. No pedal edema. Gastrointestinal system: Abdomen is nondistended, soft and nontender. No organomegaly or masses felt. Normal bowel sounds heard. Central nervous system: Alert and oriented. No focal neurological deficits. Extremities: Symmetric 5 x 5 power except left lower extremity which is in a traction and movements restricted secondary to fracture and pain. Skin: No rashes, lesions or ulcers Psychiatry: Judgement and insight appear normal. Mood & affect appropriate.     Data Reviewed: I have personally reviewed following labs and imaging studies  CBC: Recent Labs  Lab 08/25/17 0835 08/27/17 0724  WBC 6.4 7.2  NEUTROABS 4.2  --   HGB 13.0 12.9  HCT 41.3 42.0  MCV 85.7 85.5  PLT 180 627   Basic Metabolic Panel: Recent Labs  Lab 08/25/17 0835 08/27/17 0724  NA 140 140  K 3.8 3.9  CL 108 106  CO2 25 26  GLUCOSE 152* 96  BUN 14 13  CREATININE 0.82 0.81  CALCIUM 9.1 9.0   Coagulation Profile: Recent Labs  Lab 08/25/17 0835 08/26/17 0443 08/27/17 0724 08/27/17 1051  INR 2.59 3.01 >10.00* 1.31     Recent Results (from the past 240 hour(s))  MRSA PCR Screening     Status: None   Collection Time: 08/26/17 10:21 PM  Result Value Ref Range Status   MRSA by PCR NEGATIVE NEGATIVE Final    Comment:        The GeneXpert MRSA Assay (FDA approved for NASAL specimens only), is one component of a comprehensive MRSA colonization surveillance program. It is not intended to diagnose MRSA infection nor to guide or monitor treatment for MRSA infections. Performed at [facility], [place] [address], [place], [place] [address]           Radiology Studies: Ct Chest Wo Contrast  Result Date: 08/26/2017 CLINICAL DATA:  Inpatient. New small focal opacity in left lung on chest radiograph from 1 day prior. EXAM: CT CHEST WITHOUT CONTRAST TECHNIQUE: Multidetector CT imaging of the chest was performed following the standard protocol without IV contrast. COMPARISON:  Chest radiograph from one day prior. FINDINGS: Cardiovascular: Top-normal heart size. No significant pericardial effusion/thickening. Left anterior descending and left circumflex coronary atherosclerosis. Atherosclerotic nonaneurysmal thoracic aorta. Normal caliber pulmonary arteries. Mediastinum/Nodes: No discrete thyroid nodules. Unremarkable esophagus. No pathologically enlarged axillary, mediastinal or hilar lymph nodes, noting limited sensitivity for the detection of hilar adenopathy on this noncontrast study. Lungs/Pleura: No pneumothorax. No pleural effusion. No acute consolidative airspace disease, lung masses or significant pulmonary nodules. There are several scattered small parenchymal bands in the mid to lower lungs bilaterally. Upper abdomen: Small hiatal hernia.  Diffuse hepatic steatosis. Musculoskeletal: No aggressive appearing focal osseous lesions. Mild thoracic spondylosis. IMPRESSION: 1. No lung masses or significant pulmonary nodules. 2. Scattered small parenchymal bands in the mid to lower lungs bilaterally, compatible with mild scarring or atelectasis, which accounts for the left lower lung opacity described on the  chest radiograph from 1 day prior. 3. Two-vessel coronary atherosclerosis. 4. Small hiatal hernia. 5. Diffuse hepatic steatosis. Aortic Atherosclerosis (ICD10-I70.0). Electronically Signed   By: Ilona Sorrel M.D.   On: 08/26/2017 12:13        Scheduled Meds: . amLODipine  10 mg Oral Daily  . docusate sodium  100 mg Oral BID  . escitalopram  20 mg Oral Daily  . ezetimibe  10 mg Oral Daily  . hydrOXYzine  25 mg Oral QHS  .  levothyroxine  75 mcg Oral QPC breakfast  . metoprolol tartrate  25 mg Oral BID  . traZODone  100 mg Oral QHS   Continuous Infusions: . [START ON 08/28/2017] acetaminophen    . heparin    . methocarbamol (ROBAXIN)  IV    . [START ON 08/28/2017] tranexamic acid       LOS: 2 days     Vernell Leep, MD, FACP, [facility] Pager [phone]  If 7PM-7AM, please contact night-coverage [email or web address] Password [facility] 08/27/2017, 1:44 PM

## 2017-08-27 NOTE — Progress Notes (Signed)
[place] for heparin Indication: atrial fibrillation  Allergies  Allergen Reactions  . Ibandronate Sodium Other (See Comments)    Bones hurt  . Pravachol [Pravastatin Sodium] Other (See Comments)    Myalgias   . Ultram [Tramadol Hcl] Nausea Only   Patient Measurements:   Heparin Dosing Weight: 77 kg  Vital Signs:    Labs: Recent Labs    08/25/17 0835 08/26/17 0443 08/27/17 0724 08/27/17 1051 08/27/17 1942  HGB 13.0  --  12.9  --   --   HCT 41.3  --  42.0  --   --   PLT 180  --  166  --   --   LABPROT 27.6* 30.9* >90.0* 16.1*  --   INR 2.59 3.01 >10.00* 1.31  --   HEPARINUNFRC  --   --   --   --  0.12*  CREATININE 0.82  --  0.81  --   --     Estimated Creatinine Clearance: 69.5 mL/min (by C-G formula based on SCr of 0.81 mg/dL).   Assessment: 3 yoF on warfarin PTA for atrial fibrillation admitted after falling out of bed. Pharmacy consulted to start IV heparin once INR < 2. CT head negative for acute findings.  Currently on IV heparin at 1100 units/hr but IV heparin started late at 1647 and the level was collected early   Goal of Therapy:  INR 2-3 Heparin level 0.3-0.7 units/ml Monitor platelets by anticoagulation protocol: Yes   Plan:  Continue IV heparin at 1100 units/hr with plans to stop infusion at 0700 tomorrow for OR  Repeat HL at [place], PharmD., BCPS Clinical Pharmacist Clinical phone for 08/27/17 until 10:30pm: [phone] If after 10:30pm, please call main pharmacy at: [phone]

## 2017-08-27 NOTE — Progress Notes (Signed)
Asked to take over by Dr. Alvan Dame. Has displaced L basicervical femoral neck fx. Chronic coumadin - supratherapeutic yesterday. PO vit K given. On heparin gtt now. INR 1.3 today. Plan for L THA tomorrow. NPO after MN tonight. D/C heparin gtt @ 0700 tomorrow.

## 2017-08-27 NOTE — Progress Notes (Signed)
Heparin drip second verification Audie Pinto, RN

## 2017-08-28 ENCOUNTER — Inpatient Hospital Stay (HOSPITAL_COMMUNITY): Payer: Medicare Other

## 2017-08-28 ENCOUNTER — Inpatient Hospital Stay (HOSPITAL_COMMUNITY): Payer: Medicare Other | Admitting: Anesthesiology

## 2017-08-28 ENCOUNTER — Encounter (HOSPITAL_COMMUNITY): Payer: Self-pay | Admitting: Certified Registered Nurse Anesthetist

## 2017-08-28 ENCOUNTER — Encounter (HOSPITAL_COMMUNITY)
Admission: EM | Disposition: A | Discharge status: Home Health | Payer: Self-pay | Source: Home / Self Care | Attending: Internal Medicine

## 2017-08-28 DIAGNOSIS — S72002A Fracture of unspecified part of neck of left femur, initial encounter for closed fracture: Secondary | ICD-10-CM | POA: Diagnosis present

## 2017-08-28 HISTORY — PX: TOTAL HIP ARTHROPLASTY: SHX124

## 2017-08-28 HISTORY — DX: Fracture of unspecified part of neck of left femur, initial encounter for closed fracture: S72.002A

## 2017-08-28 LAB — CBC
HEMATOCRIT: 40.9 % (ref 36.0–46.0)
HEMOGLOBIN: 12.9 g/dL (ref 12.0–15.0)
MCH: 27.2 pg (ref 26.0–34.0)
MCHC: 31.5 g/dL (ref 30.0–36.0)
MCV: 86.1 fL (ref 78.0–100.0)
Platelets: 159 10*3/uL (ref 150–400)
RBC: 4.75 MIL/uL (ref 3.87–5.11)
RDW: 14.3 % (ref 11.5–15.5)
WBC: 6.8 10*3/uL (ref 4.0–10.5)

## 2017-08-28 LAB — PROTIME-INR
INR: 1.21
PROTHROMBIN TIME: 15.2 s (ref 11.4–15.2)

## 2017-08-28 LAB — HEPARIN LEVEL (UNFRACTIONATED): HEPARIN UNFRACTIONATED: 0.43 [IU]/mL (ref 0.30–0.70)

## 2017-08-28 SURGERY — ARTHROPLASTY, HIP, TOTAL, ANTERIOR APPROACH
Anesthesia: General | Site: Hip | Laterality: Left

## 2017-08-28 MED ORDER — LIDOCAINE HCL (CARDIAC) PF 100 MG/5ML IV SOSY
PREFILLED_SYRINGE | INTRAVENOUS | Status: DC | PRN
  Administered 2017-08-28: 60 mg via INTRAVENOUS

## 2017-08-28 MED ORDER — ROCURONIUM BROMIDE 10 MG/ML (PF) SYRINGE
PREFILLED_SYRINGE | INTRAVENOUS | Status: AC
  Filled 2017-08-28: qty 5

## 2017-08-28 MED ORDER — FENTANYL CITRATE (PF) 100 MCG/2ML IJ SOLN
25.0000 ug | INTRAMUSCULAR | Status: DC | PRN
  Administered 2017-08-28 (×2): 50 ug via INTRAVENOUS

## 2017-08-28 MED ORDER — SODIUM CHLORIDE 0.9 % IJ SOLN
INTRAMUSCULAR | Status: DC | PRN
  Administered 2017-08-28: 30 mL

## 2017-08-28 MED ORDER — ENOXAPARIN SODIUM 40 MG/0.4ML ~~LOC~~ SOLN
40.0000 mg | SUBCUTANEOUS | Status: DC
  Administered 2017-08-29 – 2017-09-02 (×5): 40 mg via SUBCUTANEOUS
  Filled 2017-08-28 (×5): qty 0.4

## 2017-08-28 MED ORDER — ONDANSETRON HCL 4 MG/2ML IJ SOLN: 4.0000 mg | Freq: Four times a day (QID) | INTRAMUSCULAR | Status: DC | PRN

## 2017-08-28 MED ORDER — FENTANYL CITRATE (PF) 250 MCG/5ML IJ SOLN
INTRAMUSCULAR | Status: AC
  Filled 2017-08-28: qty 5

## 2017-08-28 MED ORDER — ROCURONIUM BROMIDE 100 MG/10ML IV SOLN
INTRAVENOUS | Status: DC | PRN
  Administered 2017-08-28 (×2): 20 mg via INTRAVENOUS
  Administered 2017-08-28: 40 mg via INTRAVENOUS

## 2017-08-28 MED ORDER — CEFAZOLIN SODIUM-DEXTROSE 2-4 GM/100ML-% IV SOLN
2.0000 g | INTRAVENOUS | Status: AC
  Administered 2017-08-28: 2 g via INTRAVENOUS
  Filled 2017-08-28: qty 100

## 2017-08-28 MED ORDER — FENTANYL CITRATE (PF) 100 MCG/2ML IJ SOLN
INTRAMUSCULAR | Status: DC | PRN
  Administered 2017-08-28 (×4): 50 ug via INTRAVENOUS

## 2017-08-28 MED ORDER — DEXAMETHASONE SODIUM PHOSPHATE 10 MG/ML IJ SOLN
INTRAMUSCULAR | Status: DC | PRN
  Administered 2017-08-28: 5 mg via INTRAVENOUS

## 2017-08-28 MED ORDER — PHENOL 1.4 % MT LIQD: 1.0000 | OROMUCOSAL | Status: DC | PRN

## 2017-08-28 MED ORDER — KETOROLAC TROMETHAMINE 30 MG/ML IJ SOLN
INTRAMUSCULAR | Status: AC
  Filled 2017-08-28: qty 1

## 2017-08-28 MED ORDER — WARFARIN - PHARMACIST DOSING INPATIENT: Freq: Every day | Status: DC

## 2017-08-28 MED ORDER — LIDOCAINE 2% (20 MG/ML) 5 ML SYRINGE
INTRAMUSCULAR | Status: AC
  Filled 2017-08-28: qty 5

## 2017-08-28 MED ORDER — PROPOFOL 10 MG/ML IV BOLUS
INTRAVENOUS | Status: DC | PRN
  Administered 2017-08-28: 120 mg via INTRAVENOUS

## 2017-08-28 MED ORDER — OXYCODONE HCL 5 MG PO TABS: 5.0000 mg | ORAL_TABLET | Freq: Once | ORAL | Status: DC | PRN

## 2017-08-28 MED ORDER — SODIUM CHLORIDE 0.9 % IR SOLN
Status: DC | PRN
  Administered 2017-08-28: 3000 mL
  Administered 2017-08-28: 50 mL

## 2017-08-28 MED ORDER — BUPIVACAINE-EPINEPHRINE (PF) 0.5% -1:200000 IJ SOLN
INTRAMUSCULAR | Status: DC | PRN
  Administered 2017-08-28: 30 mL via PERINEURAL

## 2017-08-28 MED ORDER — 0.9 % SODIUM CHLORIDE (POUR BTL) OPTIME
TOPICAL | Status: DC | PRN
  Administered 2017-08-28: 1000 mL

## 2017-08-28 MED ORDER — PHENYLEPHRINE 40 MCG/ML (10ML) SYRINGE FOR IV PUSH (FOR BLOOD PRESSURE SUPPORT)
PREFILLED_SYRINGE | INTRAVENOUS | Status: DC | PRN
Start: 2017-08-28 — End: 2017-08-28
  Administered 2017-08-28: 80 ug via INTRAVENOUS

## 2017-08-28 MED ORDER — LACTATED RINGERS IV SOLN
INTRAVENOUS | Status: DC
  Administered 2017-08-28 (×3): via INTRAVENOUS

## 2017-08-28 MED ORDER — DOCUSATE SODIUM 100 MG PO CAPS
100.0000 mg | ORAL_CAPSULE | Freq: Two times a day (BID) | ORAL | Status: DC
  Administered 2017-08-28 – 2017-09-02 (×10): 100 mg via ORAL
  Filled 2017-08-28 (×10): qty 1

## 2017-08-28 MED ORDER — ONDANSETRON HCL 4 MG/2ML IJ SOLN: 4.0000 mg | Freq: Once | INTRAMUSCULAR | Status: DC | PRN

## 2017-08-28 MED ORDER — MENTHOL 3 MG MT LOZG: 1.0000 | LOZENGE | OROMUCOSAL | Status: DC | PRN

## 2017-08-28 MED ORDER — PROPOFOL 500 MG/50ML IV EMUL
INTRAVENOUS | Status: DC | PRN
  Administered 2017-08-28: 30 ug/kg/min via INTRAVENOUS

## 2017-08-28 MED ORDER — KETOROLAC TROMETHAMINE 30 MG/ML IJ SOLN
INTRAMUSCULAR | Status: DC | PRN
  Administered 2017-08-28: 30 mg

## 2017-08-28 MED ORDER — METOCLOPRAMIDE HCL 5 MG PO TABS: 5.0000 mg | ORAL_TABLET | Freq: Three times a day (TID) | ORAL | Status: DC | PRN

## 2017-08-28 MED ORDER — BUPIVACAINE-EPINEPHRINE (PF) 0.5% -1:200000 IJ SOLN
INTRAMUSCULAR | Status: AC
  Filled 2017-08-28: qty 30

## 2017-08-28 MED ORDER — OXYCODONE HCL 5 MG/5ML PO SOLN: 5.0000 mg | Freq: Once | ORAL | Status: DC | PRN

## 2017-08-28 MED ORDER — CEFAZOLIN SODIUM-DEXTROSE 2-4 GM/100ML-% IV SOLN
2.0000 g | Freq: Four times a day (QID) | INTRAVENOUS | Status: AC
  Administered 2017-08-28 – 2017-08-29 (×2): 2 g via INTRAVENOUS
  Filled 2017-08-28 (×2): qty 100

## 2017-08-28 MED ORDER — SUGAMMADEX SODIUM 200 MG/2ML IV SOLN
INTRAVENOUS | Status: DC | PRN
  Administered 2017-08-28: 200 mg via INTRAVENOUS

## 2017-08-28 MED ORDER — WARFARIN SODIUM 7.5 MG PO TABS
7.5000 mg | ORAL_TABLET | ORAL | Status: AC
  Administered 2017-08-28: 7.5 mg via ORAL
  Filled 2017-08-28: qty 1

## 2017-08-28 MED ORDER — ONDANSETRON HCL 4 MG PO TABS: 4.0000 mg | ORAL_TABLET | Freq: Four times a day (QID) | ORAL | Status: DC | PRN

## 2017-08-28 MED ORDER — FENTANYL CITRATE (PF) 100 MCG/2ML IJ SOLN
INTRAMUSCULAR | Status: AC
  Administered 2017-08-28: 50 ug via INTRAVENOUS
  Filled 2017-08-28: qty 2

## 2017-08-28 MED ORDER — ONDANSETRON HCL 4 MG/2ML IJ SOLN
INTRAMUSCULAR | Status: DC | PRN
  Administered 2017-08-28: 4 mg via INTRAVENOUS

## 2017-08-28 MED ORDER — METOCLOPRAMIDE HCL 5 MG/ML IJ SOLN: 5.0000 mg | Freq: Three times a day (TID) | INTRAMUSCULAR | Status: DC | PRN

## 2017-08-28 SURGICAL SUPPLY — 57 items
ALCOHOL ISOPROPYL (RUBBING) (MISCELLANEOUS) ×2 IMPLANT
BLADE CLIPPER SURG (BLADE) ×1 IMPLANT
CANISTER WOUND CARE 500ML ATS (WOUND CARE) ×1 IMPLANT
CAPT HIP TOTAL 2 ×1 IMPLANT
CHLORAPREP W/TINT 26ML (MISCELLANEOUS) ×2 IMPLANT
COVER SURGICAL LIGHT HANDLE (MISCELLANEOUS) ×2 IMPLANT
DERMABOND ADVANCED (GAUZE/BANDAGES/DRESSINGS)
DERMABOND ADVANCED .7 DNX12 (GAUZE/BANDAGES/DRESSINGS) ×2 IMPLANT
DRAPE C-ARM 42X72 X-RAY (DRAPES) ×2 IMPLANT
DRAPE STERI IOBAN 125X83 (DRAPES) ×1 IMPLANT
DRAPE U-SHAPE 47X51 STRL (DRAPES) ×5 IMPLANT
DRSG ADAPTIC 3X8 NADH LF (GAUZE/BANDAGES/DRESSINGS) ×1 IMPLANT
DRSG AQUACEL AG ADV 3.5X10 (GAUZE/BANDAGES/DRESSINGS) ×1 IMPLANT
DRSG VAC ATS MED SENSATRAC (GAUZE/BANDAGES/DRESSINGS) ×1 IMPLANT
ELECT BLADE 4.0 EZ CLEAN MEGAD (MISCELLANEOUS) ×2
ELECT PENCIL ROCKER SW 15FT (MISCELLANEOUS) ×2 IMPLANT
ELECT REM PT RETURN 9FT ADLT (ELECTROSURGICAL) ×2
ELECTRODE BLDE 4.0 EZ CLN MEGD (MISCELLANEOUS) ×1 IMPLANT
ELECTRODE REM PT RTRN 9FT ADLT (ELECTROSURGICAL) ×1 IMPLANT
EVACUATOR 1/8 PVC DRAIN (DRAIN) IMPLANT
GLOVE BIO SURGEON STRL SZ8.5 (GLOVE) ×4 IMPLANT
GLOVE BIOGEL PI IND STRL 8.5 (GLOVE) ×1 IMPLANT
GLOVE BIOGEL PI INDICATOR 8.5 (GLOVE) ×1
GOWN STRL REUS W/ TWL LRG LVL3 (GOWN DISPOSABLE) ×2 IMPLANT
GOWN STRL REUS W/TWL 2XL LVL3 (GOWN DISPOSABLE) ×2 IMPLANT
GOWN STRL REUS W/TWL LRG LVL3 (GOWN DISPOSABLE) ×3
HANDPIECE INTERPULSE COAX TIP (DISPOSABLE) ×1
HOOD PEEL AWAY FACE SHEILD DIS (HOOD) ×4 IMPLANT
KIT BASIN OR (CUSTOM PROCEDURE TRAY) ×2 IMPLANT
KIT TURNOVER KIT B (KITS) ×2 IMPLANT
MANIFOLD NEPTUNE II (INSTRUMENTS) ×2 IMPLANT
MARKER SKIN DUAL TIP RULER LAB (MISCELLANEOUS) ×4 IMPLANT
NDL SPNL 18GX3.5 QUINCKE PK (NEEDLE) ×1 IMPLANT
NEEDLE SPNL 18GX3.5 QUINCKE PK (NEEDLE) ×2 IMPLANT
NS IRRIG 1000ML POUR BTL (IV SOLUTION) ×2 IMPLANT
PACK TOTAL JOINT (CUSTOM PROCEDURE TRAY) ×2 IMPLANT
PACK UNIVERSAL I (CUSTOM PROCEDURE TRAY) ×2 IMPLANT
PAD ARMBOARD 7.5X6 YLW CONV (MISCELLANEOUS) ×4 IMPLANT
SAW OSC TIP CART 19.5X105X1.3 (SAW) ×2 IMPLANT
SEALER BIPOLAR AQUA 6.0 (INSTRUMENTS) ×1 IMPLANT
SET HNDPC FAN SPRY TIP SCT (DISPOSABLE) ×1 IMPLANT
SOL PREP POV-IOD 4OZ 10% (MISCELLANEOUS) ×1 IMPLANT
SUT ETHIBOND NAB CT1 #1 30IN (SUTURE) ×4 IMPLANT
SUT ETHILON 2 0 FS 18 (SUTURE) ×2 IMPLANT
SUT MNCRL AB 3-0 PS2 18 (SUTURE) ×2 IMPLANT
SUT MON AB 2-0 CT1 36 (SUTURE) ×2 IMPLANT
SUT VIC AB 1 CT1 27 (SUTURE) ×1
SUT VIC AB 1 CT1 27XBRD ANBCTR (SUTURE) ×1 IMPLANT
SUT VIC AB 2-0 CT1 27 (SUTURE) ×1
SUT VIC AB 2-0 CT1 TAPERPNT 27 (SUTURE) ×1 IMPLANT
SUT VLOC 180 0 24IN GS25 (SUTURE) ×2 IMPLANT
SYR 50ML LL SCALE MARK (SYRINGE) ×2 IMPLANT
TOWEL OR 17X24 6PK STRL BLUE (TOWEL DISPOSABLE) ×1 IMPLANT
TOWEL OR 17X26 10 PK STRL BLUE (TOWEL DISPOSABLE) ×2 IMPLANT
TRAY CATH 16FR W/PLASTIC CATH (SET/KITS/TRAYS/PACK) IMPLANT
TRAY FOLEY CATH SILVER 16FR (SET/KITS/TRAYS/PACK) IMPLANT
WATER STERILE IRR 1000ML POUR (IV SOLUTION) ×3 IMPLANT

## 2017-08-28 NOTE — Progress Notes (Signed)
[facility] for Warfarin (to begin Lovenox bridge 6/14) Indication: atrial fibrillation  Allergies  Allergen Reactions  . Ibandronate Sodium Other (See Comments)    Bones hurt  . Pravachol [Pravastatin Sodium] Other (See Comments)    Myalgias   . Ultram [Tramadol Hcl] Nausea Only   Patient Measurements: Height: 5\' 5"  (165.1 cm) Weight: 203 lb 14.8 oz (92.5 kg) IBW/kg (Calculated) : 57 Heparin Dosing Weight: 77 kg  Vital Signs: Temp: 97.7 F (36.5 C) (06/13 1930) BP: 113/65 (06/13 1930) Pulse Rate: 76 (06/13 1930)  Labs: Recent Labs    08/27/17 0724 08/27/17 1051 08/27/17 1942 08/28/17 0118  HGB 12.9  --   --  12.9  HCT 42.0  --   --  40.9  PLT 166  --   --  159  LABPROT >90.0* 16.1*  --  15.2  INR >10.00* 1.31  --  1.21  HEPARINUNFRC  --   --  0.12* 0.43  CREATININE 0.81  --   --   --     Estimated Creatinine Clearance: 69.5 mL/min (by C-G formula based on SCr of 0.81 mg/dL).   Assessment: 72 y.o. female with h/o Afib to resume warfarin tonight s/p left hip arthroplasty Plan to begin Lovenox bridge 6/14 if stable  Warfarin dose prior to admission = 5 mg daily except 7.5 q  Sunday (received 5 mg of vitamin K 6/11)  Goal of Therapy:  INR = 2 to 3 Monitor platelets by anticoagulation protocol: Yes   Plan:  Warfarin 7.5 mg po x 1 dose tonight Daily INR  Lovenox bridge to begin tomorrow if stable  Thank you Anette Guarneri, PharmD [phone]

## 2017-08-28 NOTE — Op Note (Signed)
OPERATIVE REPORT  SURGEON: Rod Can, MD   ASSISTANT: Ky Barban, RNFA.  PREOPERATIVE DIAGNOSIS: Displaced Left femoral neck fracture.   POSTOPERATIVE DIAGNOSIS: Displaced Left femoral neck fracture.   PROCEDURE: Left total hip arthroplasty, anterior approach.   IMPLANTS: DePuy Tri Lock stem, size 4, std offset. DePuy Pinnacle Cup, size 54 mm. DePuy Altrx liner, size 36 by 5 mm, neutral. DePuy Biolox ceramic head ball, size 36 + 5 mm.  ANESTHESIA:  General  ESTIMATED BLOOD LOSS:-600 mL    ANTIBIOTICS: 2 g Ancef.  DRAINS: None.  COMPLICATIONS: None.   CONDITION: PACU - hemodynamically stable.   BRIEF CLINICAL NOTE: Jennifer House is a 72 y.o. female with a displaced Left femoral neck fracture.  She was admitted to the hospitalist service for perioperative risk stratification and medical optimization. Patient takes chronic Coumadin, and INR was supratherapeutic on admission.  Coumadin was held, and patient was bridged with a heparin drip.  Once the INR had normalized, she was indicated for total hip arthroplasty. The risks, benefits, and alternatives to the procedure were explained, and the patient elected to proceed.  PROCEDURE IN DETAIL: Surgical site was marked by myself in the pre-op holding area. Once inside the operating room, spinal anesthesia was obtained, and a foley catheter was inserted. The patient was then positioned on the Hana table. All bony prominences were well padded. The hip was prepped and draped in the normal sterile surgical fashion. A time-out was called verifying side and site of surgery. The patient received IV antibiotics within 60 minutes of beginning the procedure.  The direct anterior approach to the hip was performed through the Hueter interval. Lateral femoral circumflex vessels were treated with the Auqumantys. The anterior capsule was exposed and an inverted T capsulotomy was made. The fracture hematoma was encountered and  evacuated.  She was found to have a comminuted basicervical femoral neck fracture.  There was no distal extension.  The femoral neck cut was made to the level of the templated cut. A corkscrew was placed into the head and the head was removed.The head was passed to the back table and was measured.  Acetabular exposure was achieved, and the pulvinar and labrum were excised. Sequential reaming of the acetabulum was then performed up to a size 53 mm reamer. A 54 mm cup was then opened and impacted into place at approximately 40 degrees of abduction and 20 degrees of anteversion. The final polyethylene liner was impacted into place and acetabular osteophytes were removed.   I then gained femoral exposure taking care to protect the abductors and greater trochanter. This was performed using standard external rotation, extension, and adduction. The capsule was peeled off the inner aspect of the greater trochanter, taking care to preserve the short external rotators. A cookie cutter was used to enter the femoral canal, and then the femoral canal finder was placed. Sequential broaching was performed up to a size 4. Calcar planer was used on the femoral neck remnant. I placed a std offset neck and a trial head ball. The hip was reduced. Leg lengths and offset were checked fluoroscopically. The hip was dislocated and trial components were removed. The final implants were placed, and the hip was reduced.  Fluoroscopy was used to confirm component position and leg lengths. At 90 degrees of external rotation and full extension, the hip was stable to an anterior directed force.  The wound was copiously irrigated with normal saline using pulse lavage. Marcaine solution was injected into the periarticular soft  tissue. The wound was closed in layers using #1 Vicryl and V-Loc for the fascia, 2-0 Vicryl for the subcutaneous fat, 2-0 Monocryl for the deep dermal layer, 2-0 nylon mattress suture for the skin.  Incisional wound VAC was created with Adaptic and black granufoam sponge.  The VAC was hooked up with excellent seal.  The patient was transported to the recovery room in stable condition. Sponge, needle, and instrument counts were correct at the end of the case x2. The patient tolerated the procedure well and there were no known complications.  Postoperatively, the patient will be readmitted to the hospitalist service.  Coumadin may be resumed tonight.  Begin Lovenox bridge tomorrow.  Weightbearing as tolerated left lower extremity with a walker.  Continue incisional wound VAC.  Exchange incisional wound VAC for portable Prevena just prior to discharge.  She will work with physical and occupational therapy.  She will likely be able to discharge home with home health physical therapy.  I will need to see her in the office within 7 days of discharge for VAC removal.

## 2017-08-28 NOTE — Progress Notes (Signed)
PROGRESS NOTE   Jennifer House  MAU:[id number]    DOB: 10-18-1945    DOA: 08/25/2017  PCP: Ma Hillock, DO   I have briefly reviewed patients previous medical records in [facility].  Brief Narrative:  72 year old married female, independent of activities, PMH of permanent A. fib on Coumadin (Dr. Rayann Heman, EP Cardiology), HTN, HLD, hypothyroid, fatty liver, anxiety and depression, GERD, obesity, fell out of her bed while asleep and presented with left hip pain/fracture.  Anticoagulated on admission.  Orthopedics consulted.  Anticoagulation reversed with vitamin K.  Plan for surgery on 08/28/2017.   Assessment & Plan:   Principal Problem:   Hip fracture ([place]) Active Problems:   Hypothyroidism   Essential hypertension, benign   Atrial fibrillation ([facility])   Long term (current) use of anticoagulants   Hyperlipidemia LDL goal <100   Left femoral neck fracture - Sustained post mechanical fall from bed while asleep at home. - Orthopedics was consulted and surgery was delayed while Coumadin anticoagulation was being reversed.  INR today is 1.21.  Orthopedics plans surgical fixation on 6/13. - Based on available data, patient is at moderate risk for perioperative CV events but may proceed with close perioperative monitoring.  Continue perioperative beta-blockers, discussed with RN. - Patient reports history of "difficulty to arouse" from anesthesia during prior bladder surgery in 2000 or 2001 but did better with back surgery 4 years ago.  Consider spinal anesthesia if possible.  Strict I&O's, keep fluid status even, minimize and keep perioperative sedation/opioids as much as possible, incentive spirometry encouraged.  I discussed patient's issues with anesthesia with Dr. Lyla Glassing prior to surgery this morning.  Permanent A. fib - Rate controlled. - INR 2.59 on admission and 3.01 on 6/11.  Status post vitamin K 5 mg orally x1 on 6/11.  INR this morning >10 not in keeping with clinical  picture and unable to explain, hence repeated INR, discussed with lab, repeat INR 1.31. - IV heparin per pharmacy which can be held prior to surgery and then resume Coumadin postop when okay with orthopedics.  INR today down to 1.2.  Heparin held this morning prior to surgery. - I discussed with patient's EP cardiology service on 6/12 and made them aware of patient's hospital admission and planned surgery. - Patient last seen by her cardiologist on 08/18/2017.   - TTE 08/26/2017: LVEF 60-65%, trivial AR. - CHA2DS2 Vasc score: 3 -Resume anticoagulation postop when okay with orthopedics.  Essential hypertension - Controlled.  Continue amlodipine and metoprolol.  ACEI held.  Hyperlipidemia - Continue Zetia.  Hypothyroid:  - Clinically euthyroid.  Anxiety and depression - Stable.  Continue Lexapro.  Obesity  Left lung opacity - Noted on chest x-ray.  Followed up with CT chest without contrast which showed no lung masses or significant pulmonary nodules.  Scarring versus atelectasis left lower lung area coronary atherosclerosis noted.  Incentive spirometry, patient has been aggressively using.    DVT prophylaxis: IV heparin held this morning in preparation for surgery.  Postop DVT prophylaxis as per orthopedics. Code Status: Full Family Communication: Discussed in detail with patient's 2 daughters at bedside.  Updated care and answered questions. Disposition: To be determined postop, may need SNF.   Consultants:  Orthopedics.  Procedures:  Left lower extremity traction  Antimicrobials:  None   Subjective: Patient seen this morning prior to surgery.  Stated that she was doing well.  Not much pain in her left hip.  Using incentive spirometer regularly.  Denies any other  complaints.  ROS: As above.  Patient's 2 daughters at bedside today clarified that she had problems with anesthesia in 2000 oh 2001 during bladder surgery and did better during back surgery 4 years ago.  They were  unable to elaborate.  Objective:  Vitals:   08/27/17 0509 08/27/17 2119 08/28/17 0556 08/28/17 1248  BP: 115/66 123/61 129/82   Pulse: 74 77 60   Resp: 18  19   Temp: 97.9 F (36.6 C) 98.6 F (37 C) 97.9 F (36.6 C)   TempSrc: Oral Axillary Oral   SpO2: 93% 92% 95%   Weight:    92.5 kg (203 lb 14.8 oz)  Height:    5\' 5"  (1.651 m)    Examination:  General exam: Pleasant elderly female, moderately built and obese, lying comfortably propped up in bed.  Oral mucosa moist. Respiratory system: Clear to auscultation. Respiratory effort normal.  Stable. Cardiovascular system: S1 & S2 heard, irregularly irregular. No JVD, murmurs, rubs, gallops or clicks. No pedal edema.  Stable. Gastrointestinal system: Abdomen is nondistended, soft and nontender. No organomegaly or masses felt. Normal bowel sounds heard.  Stable. Central nervous system: Alert and oriented. No focal neurological deficits.  Stable. Extremities: Symmetric 5 x 5 power except left lower extremity which is in a traction and movements restricted secondary to fracture and pain.  No change/stable. Skin: No rashes, lesions or ulcers Psychiatry: Judgement and insight appear normal. Mood & affect appropriate.     Data Reviewed: I have personally reviewed following labs and imaging studies  CBC: Recent Labs  Lab 08/25/17 0835 08/27/17 0724 08/28/17 0118  WBC 6.4 7.2 6.8  NEUTROABS 4.2  --   --   HGB 13.0 12.9 12.9  HCT 41.3 42.0 40.9  MCV 85.7 85.5 86.1  PLT 180 166 540   Basic Metabolic Panel: Recent Labs  Lab 08/25/17 0835 08/27/17 0724  NA 140 140  K 3.8 3.9  CL 108 106  CO2 25 26  GLUCOSE 152* 96  BUN 14 13  CREATININE 0.82 0.81  CALCIUM 9.1 9.0   Coagulation Profile: Recent Labs  Lab 08/25/17 0835 08/26/17 0443 08/27/17 0724 08/27/17 1051 08/28/17 0118  INR 2.59 3.01 >10.00* 1.31 1.21     Recent Results (from the past 240 hour(s))  MRSA PCR Screening     Status: None   Collection Time:  08/26/17 10:21 PM  Result Value Ref Range Status   MRSA by PCR NEGATIVE NEGATIVE Final    Comment:        The GeneXpert MRSA Assay (FDA approved for NASAL specimens only), is one component of a comprehensive MRSA colonization surveillance program. It is not intended to diagnose MRSA infection nor to guide or monitor treatment for MRSA infections. Performed at [facility], [place] [address] [place],  [address]          Radiology Studies: No results found.      Scheduled Meds: . [MAR Hold] amLODipine  10 mg Oral Daily  . chlorhexidine  60 mL Topical Once  . [MAR Hold] docusate sodium  100 mg Oral BID  . [MAR Hold] escitalopram  20 mg Oral Daily  . [MAR Hold] ezetimibe  10 mg Oral Daily  . [MAR Hold] hydrOXYzine  25 mg Oral QHS  . [MAR Hold] levothyroxine  75 mcg Oral QPC breakfast  . [MAR Hold] metoprolol tartrate  25 mg Oral BID  . povidone-iodine  2 application Topical Once  . [MAR Hold] traZODone  100 mg  Oral QHS   Continuous Infusions: . acetaminophen    .  ceFAZolin (ANCEF) IV    . lactated ringers    . [MAR Hold] methocarbamol (ROBAXIN)  IV    . tranexamic acid       LOS: 3 days     Vernell Leep, MD, FACP, [facility]. [facility] Pager [phone]  If 7PM-7AM, please contact night-coverage [email or web address] Password [email or web address] 08/28/2017, 3:12 PM

## 2017-08-28 NOTE — Anesthesia Preprocedure Evaluation (Addendum)
Anesthesia Evaluation  Patient identified by MRN, date of birth, ID band Patient awake    Reviewed: Allergy & Precautions, H&P , NPO status , Patient's Chart, lab work & pertinent test results, reviewed documented beta blocker date and time   Airway Mallampati: I  TM Distance: >3 FB Neck ROM: Full    Dental  (+) Edentulous Upper, Edentulous Lower   Pulmonary former smoker,    breath sounds clear to auscultation       Cardiovascular hypertension, Pt. on medications and Pt. on home beta blockers + dysrhythmias Atrial Fibrillation  Rhythm:Irregular Rate:Normal  '19 TTE - EF 60% to 65%. Trivial AI. Mod-Sev LA and RA dilatation. PASP 32 mmHg. A trivial pericardial effusion was identified.   Neuro/Psych  Headaches, PSYCHIATRIC DISORDERS Anxiety Depression RLS    GI/Hepatic GERD  Medicated and Controlled,  Endo/Other  Hypothyroidism Obesity  Renal/GU    Bladder cancer    Musculoskeletal  (+) Arthritis ,   Abdominal   Peds  Hematology negative hematology ROS (+)   Anesthesia Other Findings   Reproductive/Obstetrics                           Anesthesia Physical  Anesthesia Plan  ASA: III  Anesthesia Plan: General   Post-op Pain Management:    Induction: Intravenous  PONV Risk Score and Plan: 3 and Treatment may vary due to age or medical condition, Ondansetron and Dexamethasone  Airway Management Planned: Oral ETT  Additional Equipment: None  Intra-op Plan:   Post-operative Plan: Extubation in OR  Informed Consent: I have reviewed the patients History and Physical, chart, labs and discussed the procedure including the risks, benefits and alternatives for the proposed anesthesia with the patient or authorized representative who has indicated his/her understanding and acceptance.   Dental advisory given  Plan Discussed with: Anesthesiologist and CRNA  Anesthesia Plan Comments:          Anesthesia Quick Evaluation

## 2017-08-28 NOTE — Interval H&P Note (Signed)
History and Physical Interval Note:  08/28/2017 4:15 PM  Jennifer House  has presented today for surgery, with the diagnosis of hip fracture  The various methods of treatment have been discussed with the patient and family. After consideration of risks, benefits and other options for treatment, the patient has consented to  Procedure(s): TOTAL HIP ARTHROPLASTY ANTERIOR APPROACH (Left) as a surgical intervention .  The patient's history has been reviewed, patient examined, no change in status, stable for surgery.  I have reviewed the patient's chart and labs.  Questions were answered to the patient's satisfaction.    The risks, benefits, and alternatives were discussed with the patient. There are risks associated with the surgery including, but not limited to, problems with anesthesia (death), infection, instability (giving out of the joint), dislocation, differences in leg length/angulation/rotation, fracture of bones, loosening or failure of implants, hematoma (blood accumulation) which may require surgical drainage, blood clots, pulmonary embolism, nerve injury (foot drop and lateral thigh numbness), and blood vessel injury. The patient understands these risks and elects to proceed.   Hilton Cork Buell Parcel

## 2017-08-28 NOTE — Anesthesia Postprocedure Evaluation (Signed)
Anesthesia Post Note  Patient: Jennifer House  Procedure(s) Performed: TOTAL HIP ARTHROPLASTY ANTERIOR APPROACH (Left Hip)     Patient location during evaluation: PACU Anesthesia Type: General Level of consciousness: awake and alert Pain management: pain level controlled Vital Signs Assessment: post-procedure vital signs reviewed and stable Respiratory status: spontaneous breathing, nonlabored ventilation, respiratory function stable and patient connected to nasal cannula oxygen Cardiovascular status: blood pressure returned to baseline and stable Postop Assessment: no apparent nausea or vomiting Anesthetic complications: no    Last Vitals:  Vitals:   08/28/17 1915 08/28/17 1930  BP: 119/62 113/65  Pulse: 66 76  Resp: 11 17  Temp:  36.5 C  SpO2: 96% 94%    Last Pain:  Vitals:   08/28/17 1925  TempSrc:   PainSc: 7                  Yunis Voorheis,W. EDMOND

## 2017-08-28 NOTE — Transfer of Care (Signed)
Immediate Anesthesia Transfer of Care Note  Patient: Jennifer House  Procedure(s) Performed: TOTAL HIP ARTHROPLASTY ANTERIOR APPROACH (Left Hip)  Patient Location: PACU  Anesthesia Type:General  Level of Consciousness: awake, alert  and oriented  Airway & Oxygen Therapy: Patient Spontanous Breathing and Patient connected to nasal cannula oxygen  Post-op Assessment: Report given to RN and Post -op Vital signs reviewed and stable  Post vital signs: Reviewed and stable  Last Vitals:  Vitals Value Taken Time  BP    Temp 36.6 C 08/28/2017  6:44 PM  Pulse    Resp    SpO2      Last Pain:  Vitals:   08/28/17 1844  TempSrc:   PainSc: 0-No pain         Complications: No apparent anesthesia complications

## 2017-08-28 NOTE — Progress Notes (Signed)
[place] for heparin Indication: atrial fibrillation  Allergies  Allergen Reactions  . Ibandronate Sodium Other (See Comments)    Bones hurt  . Pravachol [Pravastatin Sodium] Other (See Comments)    Myalgias   . Ultram [Tramadol Hcl] Nausea Only   Patient Measurements:   Heparin Dosing Weight: 77 kg  Vital Signs: Temp: 98.6 F (37 C) (06/12 2119) Temp Source: Axillary (06/12 2119) BP: 123/61 (06/12 2119) Pulse Rate: 77 (06/12 2119)  Labs: Recent Labs    08/25/17 0835  08/27/17 0724 08/27/17 1051 08/27/17 1942 08/28/17 0118  HGB 13.0  --  12.9  --   --  12.9  HCT 41.3  --  42.0  --   --  40.9  PLT 180  --  166  --   --  159  LABPROT 27.6*   < > >90.0* 16.1*  --  15.2  INR 2.59   < > >10.00* 1.31  --  1.21  HEPARINUNFRC  --   --   --   --  0.12* 0.43  CREATININE 0.82  --  0.81  --   --   --    < > = values in this interval not displayed.    Estimated Creatinine Clearance: 69.5 mL/min (by C-G formula based on SCr of 0.81 mg/dL).   Assessment: 72 y.o. female with h/o Afib, Coumadin on hold, for heparin Goal of Therapy:  INR 2-3 Heparin level 0.3-0.7 units/ml Monitor platelets by anticoagulation protocol: Yes   Plan:  Continue Heparin at current rate  F/U after OR today  Phillis Knack, PharmD, BCPS

## 2017-08-28 NOTE — Care Management Important Message (Signed)
Important Message  Patient Details  Name: Jennifer House MRN: [id number] Date of Birth: May 21, 1945   Medicare Important Message Given:  Yes    Orbie Pyo 08/28/2017, 2:58 PM

## 2017-08-28 NOTE — Anesthesia Procedure Notes (Signed)

## 2017-08-29 ENCOUNTER — Other Ambulatory Visit: Payer: Self-pay

## 2017-08-29 DIAGNOSIS — D62 Acute posthemorrhagic anemia: Secondary | ICD-10-CM

## 2017-08-29 LAB — CBC
HCT: 36.8 % (ref 36.0–46.0)
Hemoglobin: 11.6 g/dL — ABNORMAL LOW (ref 12.0–15.0)
MCH: 26.9 pg (ref 26.0–34.0)
MCHC: 31.5 g/dL (ref 30.0–36.0)
MCV: 85.2 fL (ref 78.0–100.0)
PLATELETS: 168 10*3/uL (ref 150–400)
RBC: 4.32 MIL/uL (ref 3.87–5.11)
RDW: 13.7 % (ref 11.5–15.5)
WBC: 8.3 10*3/uL (ref 4.0–10.5)

## 2017-08-29 LAB — BASIC METABOLIC PANEL
ANION GAP: 4 — AB (ref 5–15)
BUN: 17 mg/dL (ref 6–20)
CALCIUM: 8.5 mg/dL — AB (ref 8.9–10.3)
CO2: 27 mmol/L (ref 22–32)
Chloride: 105 mmol/L (ref 101–111)
Creatinine, Ser: 0.85 mg/dL (ref 0.44–1.00)
GLUCOSE: 160 mg/dL — AB (ref 65–99)
Potassium: 4.4 mmol/L (ref 3.5–5.1)
Sodium: 136 mmol/L (ref 135–145)

## 2017-08-29 LAB — PROTIME-INR
INR: 1.09
Prothrombin Time: 14.1 seconds (ref 11.4–15.2)

## 2017-08-29 MED ORDER — HYDROCODONE-ACETAMINOPHEN 5-325 MG PO TABS: 1.0000 | ORAL_TABLET | Freq: Four times a day (QID) | ORAL | 0 refills | Status: DC | PRN

## 2017-08-29 MED ORDER — WARFARIN SODIUM 7.5 MG PO TABS
7.5000 mg | ORAL_TABLET | Freq: Once | ORAL | Status: AC
  Administered 2017-08-29: 7.5 mg via ORAL
  Filled 2017-08-29: qty 1

## 2017-08-29 NOTE — Progress Notes (Addendum)
ANTICOAGULATION CONSULT NOTE  Pharmacy Consult:  Coumadin Indication: atrial fibrillation  Allergies  Allergen Reactions  . Ibandronate Sodium Other (See Comments)    Bones hurt  . Pravachol [Pravastatin Sodium] Other (See Comments)    Myalgias   . Ultram [Tramadol Hcl] Nausea Only   Patient Measurements: Height: 5\' 5"  (165.1 cm) Weight: 203 lb 14.8 oz (92.5 kg) IBW/kg (Calculated) : 57  Vital Signs: Temp: 97.8 F (36.6 C) (06/14 0600) Temp Source: Oral (06/14 0600) BP: 104/64 (06/14 1026) Pulse Rate: 81 (06/14 1026)  Labs: Recent Labs    08/27/17 0724 08/27/17 1051 08/27/17 1942 08/28/17 0118 08/29/17 0522 08/29/17 0524  HGB 12.9  --   --  12.9 11.6*  --   HCT 42.0  --   --  40.9 36.8  --   PLT 166  --   --  159 168  --   LABPROT >90.0* 16.1*  --  15.2  --  14.1  INR >10.00* 1.31  --  1.21  --  1.09  HEPARINUNFRC  --   --  0.12* 0.43  --   --   CREATININE 0.81  --   --   --  0.85  --     Estimated Creatinine Clearance: 65.3 mL/min (by C-G formula based on SCr of 0.85 mg/dL).   Assessment: 72 y.o. female with history of Afib on Coumadin PTA.  Patient presented s/p fall and Coumadin was reversed with Vitamin K on 08/26/17.  She was on IV heparin bridge briefly before left hip arthroplasty on 08/28/17.  Coumadin resumed on 08/28/17 and Lovenox bridge started today 08/29/17.   INR is sub-therapeutic as expected.  No bleeding reported.  Home Coumadin dose: 5mg  daily except 7.5mg  on Sunday   Goal of Therapy:  INR 2 - 3 Monitor platelets by anticoagulation protocol: Yes    Plan:  Coumadin 7.5mg  PO today Lovenox 40mg  SQ Q24H.  D/C when INR >/= 2 per order but 1.8 per Coumadin consult. Daily PT / INR   Leeanne Butters D. Mina Marble, PharmD, BCPS, BCCCP Pager:  [phone] 08/29/2017, 11:08 AM

## 2017-08-29 NOTE — Progress Notes (Signed)
Physical Therapy Treatment Patient Details Name: Jennifer House MRN: [id number] DOB: 07-23-1945 Today's Date: 08/29/2017    History of Present Illness Pt is a 72 y/o female who sustained a fall at home and is now s/p L THA direct anterior approach. PMH including but not limited to HTN, HLD and a-fib.    PT Comments    Pt making steady progress and tolerated ambulation within her room with RW and min guard for safety. PT will continue to follow acutely to progress mobility as tolerated. Plan to perform stair training with pt at next session if appropriate. Pt would continue to benefit from skilled physical therapy services at this time while admitted and after d/c to address the below listed limitations in order to improve overall safety and independence with functional mobility.    Follow Up Recommendations  Home health PT;Supervision/Assistance - 24 hour     Equipment Recommendations  Rolling walker with 5" wheels;3in1 (PT)    Recommendations for Other Services       Precautions / Restrictions Precautions Precautions: Fall Precaution Comments: wound VAC L hip Restrictions Weight Bearing Restrictions: Yes LLE Weight Bearing: Weight bearing as tolerated    Mobility  Bed Mobility Overal bed mobility: Needs Assistance Bed Mobility: Sit to Supine       Sit to supine: Min assist   General bed mobility comments: assist to return L LE onto bed  Transfers Overall transfer level: Needs assistance Equipment used: Rolling walker (2 wheeled) Transfers: Sit to/from [facility] Sit to Stand: Min guard Stand pivot transfers: Min assist       General transfer comment: increased time and effort, good technique, min guard for safety with rise into standing from recliner chair, min A for pivot to [facility]  Ambulation/Gait Ambulation/Gait assistance: Min guard Gait Distance (Feet): 20 Feet Assistive device: Rolling walker (2 wheeled) Gait Pattern/deviations: Step-to  pattern;Decreased step length - right;Decreased stance time - left;Decreased stride length;Decreased weight shift to left Gait velocity: decreased Gait velocity interpretation: <1.31 ft/sec, indicative of household ambulator General Gait Details: cueing for sequencing with RW, close min guard for safety and line management with wound VAC, pt limited secondary to fatigue   Stairs             Wheelchair Mobility    Modified Rankin (Stroke Patients Only)       Balance Overall balance assessment: Needs assistance Sitting-balance support: Feet supported Sitting balance-Leahy Scale: Good     Standing balance support: During functional activity;Bilateral upper extremity supported Standing balance-Leahy Scale: Poor                              Cognition Arousal/Alertness: Awake/alert Behavior During Therapy: WFL for tasks assessed/performed Overall Cognitive Status: Within Functional Limits for tasks assessed                                        Exercises      General Comments        Pertinent Vitals/Pain Pain Assessment: Faces Faces Pain Scale: Hurts little more Pain Location: L hip Pain Descriptors / Indicators: Sore Pain Intervention(s): Repositioned;Monitored during session    Home Living                      Prior Function  PT Goals (current goals can now be found in the care plan section) Acute Rehab PT Goals PT Goal Formulation: With patient/family Time For Goal Achievement: 09/12/17 Potential to Achieve Goals: Good Progress towards PT goals: Progressing toward goals    Frequency    7X/week      PT Plan Current plan remains appropriate    Co-evaluation              AM-PAC PT "6 Clicks" Daily Activity  Outcome Measure  Difficulty turning over in bed (including adjusting bedclothes, sheets and blankets)?: Unable Difficulty moving from lying on back to sitting on the side of the bed? :  Unable Difficulty sitting down on and standing up from a chair with arms (e.g., wheelchair, bedside commode, etc,.)?: Unable Help needed moving to and from a bed to chair (including a wheelchair)?: A Little Help needed walking in hospital room?: A Little Help needed climbing 3-5 steps with a railing? : A Little 6 Click Score: 12    End of Session Equipment Utilized During Treatment: Gait belt Activity Tolerance: Patient tolerated treatment well Patient left: in bed;with call bell/phone within reach;with family/visitor present Nurse Communication: Mobility status PT Visit Diagnosis: Other abnormalities of gait and mobility (R26.89)     Time: 2297-9892 PT Time Calculation (min) (ACUTE ONLY): 20 min  Charges:  $Gait Training: 8-22 mins                    G Codes:       [place], [place], [place] 08/29/2017, 5:19 PM

## 2017-08-29 NOTE — Progress Notes (Signed)
PROGRESS NOTE   Jennifer House  UXN:[id number]    DOB: 1945-06-26    DOA: 08/25/2017  PCP: Ma Hillock, DO   I have briefly reviewed patients previous medical records in [facility].  Brief Narrative:  72 year old married female, independent of activities, PMH of permanent A. fib on Coumadin (Dr. Rayann Heman, EP Cardiology), HTN, HLD, hypothyroid, fatty liver, anxiety and depression, GERD, obesity, fell out of her bed while asleep and presented with left hip pain/fracture.  Anticoagulated on admission.  Orthopedics consulted.  Anticoagulation reversed with vitamin K.  Status post left total hip arthroplasty, anterior approach on 6/13.   Assessment & Plan:   Principal Problem:   Hip fracture ([place]) Active Problems:   Hypothyroidism   Essential hypertension, benign   Atrial fibrillation (HCC)   Long term (current) use of anticoagulants   Hyperlipidemia LDL goal <100   Closed displaced fracture of left femoral neck (HCC)   Left femoral neck fracture - Sustained post mechanical fall from bed while asleep at home. - Orthopedics was consulted and surgery was delayed while Coumadin anticoagulation was being reversed.  -Patient underwent left total hip arthroplasty, anterior approach on 6/13.  Management per orthopedics: Weightbearing as tolerated with walker, Coumadin with Lovenox bridge for DVT prophylaxis, PT and OT.  Acute blood loss anemia Follow CBC in a.m. and transfuse if hemoglobin 7 or less.  Permanent A. fib - Rate controlled. -Patient's INR was reversed with vitamin K in preparation for surgery.  She was briefly on IV heparin drip which was turned off several hours prior to surgery.  Postop she was started on Lovenox 40 mg along with Coumadin per pharmacy. - Patient last seen by her cardiologist on 08/18/2017.   - TTE 08/26/2017: LVEF 60-65%, trivial AR. - CHA2DS2 Vasc score: 3 -I discussed with Dr. Lyla Glassing who recommends continuing prophylactic dose Lovenox 40 mg daily  but no full dose Lovenox due to increased bleeding risk.  DC Lovenox when INR therapeutic 2 or greater.  Essential hypertension - Controlled.  Continue amlodipine and metoprolol.  ACEI held.  Hyperlipidemia - Continue Zetia.  Hypothyroid:  - Clinically euthyroid.  Anxiety and depression - Stable.  Continue Lexapro.  Obesity  Left lung opacity - Noted on chest x-ray.  Followed up with CT chest without contrast which showed no lung masses or significant pulmonary nodules.  Scarring versus atelectasis left lower lung area coronary atherosclerosis noted.  Incentive spirometry, patient has been aggressively using.    DVT prophylaxis: Lovenox bridging and Coumadin. Code Status: Full Family Communication: Discussed in detail with patient's daughter and spouse at bedside.  Updated care and answered questions. Disposition: Pending clinical improvement, possibly in the next 48 hours.   Consultants:  Orthopedics.  Procedures:  Left lower extremity traction  Antimicrobials:  None   Subjective: Patient states that she feels "okay".  Not much pain reported at surgery site.  Just completed working with PT.  No dyspnea or chest pain reported.  States that she did not have any complications during anesthesia/surgery.  Objective:  Vitals:   08/28/17 2226 08/29/17 0600 08/29/17 1026 08/29/17 1411  BP: 116/73 (!) 131/58 104/64 114/71  Pulse: 84 72 81 87  Resp: (!) 1   18  Temp: 97.7 F (36.5 C) 97.8 F (36.6 C)  98.3 F (36.8 C)  TempSrc: Oral Oral  Oral  SpO2: 96% 98%  96%  Weight:      Height:        Examination:  General exam:  Pleasant elderly female, moderately built and obese, sitting up comfortably in chair. Respiratory system: Clear to auscultation. Respiratory effort normal.  Stable Cardiovascular system: S1 & S2 heard, irregularly irregular. No JVD, murmurs, rubs, gallops or clicks. No pedal edema.  Stable Gastrointestinal system: Abdomen is nondistended, soft and  nontender. No organomegaly or masses felt. Normal bowel sounds heard.  Stable Central nervous system: Alert and oriented. No focal neurological deficits.  Stable. Extremities: Symmetric 5 x 5 power except left lower extremity where movements still restricted by postop pain.  Surgical site dressing clean and dry. Skin: No rashes, lesions or ulcers Psychiatry: Judgement and insight appear normal. Mood & affect appropriate.     Data Reviewed: I have personally reviewed following labs and imaging studies  CBC: Recent Labs  Lab 08/25/17 0835 08/27/17 0724 08/28/17 0118 08/29/17 0522  WBC 6.4 7.2 6.8 8.3  NEUTROABS 4.2  --   --   --   HGB 13.0 12.9 12.9 11.6*  HCT 41.3 42.0 40.9 36.8  MCV 85.7 85.5 86.1 85.2  PLT 180 166 159 093   Basic Metabolic Panel: Recent Labs  Lab 08/25/17 0835 08/27/17 0724 08/29/17 0522  NA 140 140 136  K 3.8 3.9 4.4  CL 108 106 105  CO2 25 26 27   GLUCOSE 152* 96 160*  BUN 14 13 17   CREATININE 0.82 0.81 0.85  CALCIUM 9.1 9.0 8.5*   Coagulation Profile: Recent Labs  Lab 08/26/17 0443 08/27/17 0724 08/27/17 1051 08/28/17 0118 08/29/17 0524  INR 3.01 >10.00* 1.31 1.21 1.09     Recent Results (from the past 240 hour(s))  MRSA PCR Screening     Status: None   Collection Time: 08/26/17 10:21 PM  Result Value Ref Range Status   MRSA by PCR NEGATIVE NEGATIVE Final    Comment:        The GeneXpert MRSA Assay (FDA approved for NASAL specimens only), is one component of a comprehensive MRSA colonization surveillance program. It is not intended to diagnose MRSA infection nor to guide or monitor treatment for MRSA infections. Performed at [facility], [place] [address] [place], [place] [address]          Radiology Studies: Pelvis Portable  Result Date: 08/28/2017 CLINICAL DATA:  Post left total hip replacement. EXAM: PORTABLE PELVIS 1-2 VIEWS COMPARISON:  08/25/2017 and earlier same day. FINDINGS: Examination demonstrates  patient's recent left total hip arthroplasty with prosthetic components normally located and intact. Mild degenerate change of the right hip and spine. IMPRESSION: Expected changes post left total hip arthroplasty. Electronically Signed   By: Marin Olp M.D.   On: 08/28/2017 19:44   Dg C-arm 1-60 Min  Result Date: 08/28/2017 CLINICAL DATA:  Hip arthroplasty EXAM: OPERATIVE left HIP (WITH PELVIS IF PERFORMED) 2 VIEWS TECHNIQUE: Fluoroscopic spot image(s) were submitted for interpretation post-operatively. COMPARISON:  08/25/2017 FINDINGS: Total fluoroscopy time was 17 seconds. Two low resolution intraoperative spot views of the left hip. The images demonstrate a left hip replacement with normal alignment. IMPRESSION: Intraoperative fluoroscopic assistance provided during left hip replacement Electronically Signed   By: Donavan Foil M.D.   On: 08/28/2017 19:35   Dg Hip Operative Unilat W Or W/o Pelvis Left  Result Date: 08/28/2017 CLINICAL DATA:  Hip arthroplasty EXAM: OPERATIVE left HIP (WITH PELVIS IF PERFORMED) 2 VIEWS TECHNIQUE: Fluoroscopic spot image(s) were submitted for interpretation post-operatively. COMPARISON:  08/25/2017 FINDINGS: Total fluoroscopy time was 17 seconds. Two low resolution intraoperative spot views of the left hip. The  images demonstrate a left hip replacement with normal alignment. IMPRESSION: Intraoperative fluoroscopic assistance provided during left hip replacement Electronically Signed   By: Donavan Foil M.D.   On: 08/28/2017 19:35        Scheduled Meds: . amLODipine  10 mg Oral Daily  . docusate sodium  100 mg Oral BID  . enoxaparin (LOVENOX) injection  40 mg Subcutaneous Q24H  . escitalopram  20 mg Oral Daily  . ezetimibe  10 mg Oral Daily  . hydrOXYzine  25 mg Oral QHS  . levothyroxine  75 mcg Oral QPC breakfast  . metoprolol tartrate  25 mg Oral BID  . traZODone  100 mg Oral QHS  . warfarin  7.5 mg Oral ONCE-1800  . Warfarin - Pharmacist Dosing  Inpatient   Does not apply q1800   Continuous Infusions: . lactated ringers 10 mL/hr at 08/28/17 2250  . methocarbamol (ROBAXIN)  IV       LOS: 4 days     Vernell Leep, MD, FACP, [facility]. [facility] Pager [phone]  If 7PM-7AM, please contact night-coverage [email or web address] Password [facility] 08/29/2017, 5:05 PM

## 2017-08-29 NOTE — Progress Notes (Addendum)
    Subjective:  Patient reports pain as mild to moderate.  Denies N/V/CP/SOB.   Objective:   VITALS:   Vitals:   08/28/17 1930 08/28/17 2226 08/29/17 0600 08/29/17 1026  BP: 113/65 116/73 (!) 131/58 104/64  Pulse: 76 84 72 81  Resp: 17 (!) 1    Temp: 97.7 F (36.5 C) 97.7 F (36.5 C) 97.8 F (36.6 C)   TempSrc:  Oral Oral   SpO2: 94% 96% 98%   Weight:      Height:        NAD ABD soft Sensation intact distally Intact pulses distally Dorsiflexion/Plantar flexion intact Incision: dressing C/D/I Compartment soft iVac intact  Lab Results  Component Value Date   WBC 8.3 08/29/2017   HGB 11.6 (L) 08/29/2017   HCT 36.8 08/29/2017   MCV 85.2 08/29/2017   PLT 168 08/29/2017   BMET    Component Value Date/Time   NA 136 08/29/2017 0522   K 4.4 08/29/2017 0522   CL 105 08/29/2017 0522   CO2 27 08/29/2017 0522   GLUCOSE 160 (H) 08/29/2017 0522   BUN 17 08/29/2017 0522   CREATININE 0.85 08/29/2017 0522   CREATININE 0.91 09/19/2016 1009   CALCIUM 8.5 (L) 08/29/2017 0522   GFRNONAA >60 08/29/2017 0522   GFRNONAA 63 09/19/2016 1009   GFRAA >60 08/29/2017 0522   GFRAA 72 09/19/2016 1009   INR 1.09  Anticipated LOS equal to or greater than 2 midnights due to - Age 6 and older with one or more of the following:  - Obesity  - Expected need for hospital services (PT, OT, Nursing) required for safe  discharge  - Anticipated need for postoperative skilled nursing care or inpatient rehab  - Active co-morbidities: Cardiac Arrhythmia OR   - Unanticipated findings during/Post Surgery: None  - Patient is a high risk of re-admission due to: None    Assessment/Plan: 1 Day Post-Op   Principal Problem:   Hip fracture (HCC) Active Problems:   Hypothyroidism   Essential hypertension, benign   Atrial fibrillation ([place])   Long term (current) use of anticoagulants   Hyperlipidemia LDL goal <100   Closed displaced fracture of left femoral neck (HCC)   WBAT with  walker DVT ppx: coumadin with lovenox bridge (d/c lovenox when INR > 1.8), SCDs, TEDS PO pain control PT/OT Dispo: Matt Babish, PA-C will place Prevena VAC on Sunday (@ bedside), d/c home Sunday with HHPT    [place] Ja Ohman 08/29/2017, 11:15 AM   Rod Can, MD Cell [phone]

## 2017-08-29 NOTE — Evaluation (Signed)
Physical Therapy Evaluation Patient Details Name: Jennifer House MRN: [id number] DOB: May 08, 1945 Today's Date: 08/29/2017   History of Present Illness  Pt is a 72 y/o female who sustained a fall at home and is now s/p L THA direct anterior approach. PMH including but not limited to HTN, HLD and a-fib.    Clinical Impression  Pt presented supine in bed with HOB elevated, awake and willing to participate in therapy session. Prior to admission, pt reported that she was independent with all functional mobility and ADLs. Pt currently requires min A for bed mobility and min-mod A with transfers and use of RW. Pt would continue to benefit from skilled physical therapy services at this time while admitted and after d/c to address the below listed limitations in order to improve overall safety and independence with functional mobility.     Follow Up Recommendations Home health PT;Supervision/Assistance - 24 hour;Other (comment)(family is adamant about return home as pt has good support)    Equipment Recommendations  Rolling walker with 5" wheels;3in1 (PT)    Recommendations for Other Services       Precautions / Restrictions Precautions Precautions: Fall Restrictions Weight Bearing Restrictions: Yes LLE Weight Bearing: Weight bearing as tolerated      Mobility  Bed Mobility Overal bed mobility: Needs Assistance Bed Mobility: Supine to Sit     Supine to sit: Min assist     General bed mobility comments: increased time and effort, use of bed rails, cueing for technique/sequencing, assist with L LE movement off of bed and with trunk elevation  Transfers Overall transfer level: Needs assistance Equipment used: Rolling walker (2 wheeled) Transfers: Sit to/from [facility] Sit to Stand: Mod assist Stand pivot transfers: Min assist       General transfer comment: increased time and effort, cueing for safe hand placement, assist to power into standing from EOB and for  stability with pivotal movements to chair  Ambulation/Gait                Stairs            Wheelchair Mobility    Modified Rankin (Stroke Patients Only)       Balance Overall balance assessment: Needs assistance Sitting-balance support: Feet supported Sitting balance-Leahy Scale: Good     Standing balance support: During functional activity;Bilateral upper extremity supported Standing balance-Leahy Scale: Poor                               Pertinent Vitals/Pain Pain Assessment: 0-10 Pain Score: 5  Pain Location: L hip Pain Descriptors / Indicators: Sore Pain Intervention(s): Monitored during session;Repositioned    Home Living Family/patient expects to be discharged to:: Private residence Living Arrangements: Spouse/significant other;Children Available Help at Discharge: Family;Available 24 hours/day Type of Home: House Home Access: Stairs to enter Entrance Stairs-Rails: None Entrance Stairs-Number of Steps: 3 Home Layout: One level Home Equipment: Clinical cytogeneticist - standard      Prior Function Level of Independence: Independent               Hand Dominance   Dominant Hand: Left    Extremity/Trunk Assessment   Upper Extremity Assessment Upper Extremity Assessment: Overall WFL for tasks assessed    Lower Extremity Assessment Lower Extremity Assessment: LLE deficits/detail LLE Deficits / Details: pt with decreased strength and ROM limitations secondary to post-op pain and weakness. Sensation grossly intact.       Communication  Communication: No difficulties  Cognition Arousal/Alertness: Awake/alert Behavior During Therapy: WFL for tasks assessed/performed Overall Cognitive Status: Within Functional Limits for tasks assessed                                        General Comments      Exercises     Assessment/Plan    PT Assessment Patient needs continued PT services  PT Problem List  Decreased strength;Decreased range of motion;Decreased balance;Decreased mobility;Decreased activity tolerance;Decreased coordination;Decreased knowledge of use of DME;Decreased safety awareness;Decreased knowledge of precautions;Pain       PT Treatment Interventions Gait training;Stair training;DME instruction;Functional mobility training;Therapeutic activities;Therapeutic exercise;Balance training;Neuromuscular re-education;Patient/family education    PT Goals (Current goals can be found in the Care Plan section)  Acute Rehab PT Goals Patient Stated Goal: return home, decrease pain PT Goal Formulation: With patient/family Time For Goal Achievement: 09/12/17 Potential to Achieve Goals: Good    Frequency 7X/week   Barriers to discharge        Co-evaluation               AM-PAC PT "6 Clicks" Daily Activity  Outcome Measure Difficulty turning over in bed (including adjusting bedclothes, sheets and blankets)?: Unable Difficulty moving from lying on back to sitting on the side of the bed? : Unable Difficulty sitting down on and standing up from a chair with arms (e.g., wheelchair, bedside commode, etc,.)?: Unable Help needed moving to and from a bed to chair (including a wheelchair)?: A Little Help needed walking in hospital room?: A Little Help needed climbing 3-5 steps with a railing? : A Lot 6 Click Score: 11    End of Session Equipment Utilized During Treatment: Gait belt Activity Tolerance: Patient tolerated treatment well Patient left: in chair;with call bell/phone within reach;with family/visitor present Nurse Communication: Mobility status PT Visit Diagnosis: Other abnormalities of gait and mobility (R26.89)    Time: 2353-6144 PT Time Calculation (min) (ACUTE ONLY): 26 min   Charges:   PT Evaluation $PT Eval Moderate Complexity: 1 Mod PT Treatments $Therapeutic Activity: 8-22 mins   PT G Codes:        [place], PT, DPT [place] 08/29/2017, 10:50 AM

## 2017-08-29 NOTE — Discharge Instructions (Signed)
° °Dr. Brian Swinteck °Joint Replacement Specialist °[facility] °[address] °[place],  [address] °[phone] ° ° °TOTAL HIP REPLACEMENT POSTOPERATIVE DIRECTIONS ° ° ° °Hip Rehabilitation, Guidelines Following Surgery  ° °WEIGHT BEARING °Weight bearing as tolerated with assist device (walker, cane, etc) as directed, use it as long as suggested by your surgeon or therapist, typically at least 4-6 weeks. ° °The results of a hip operation are greatly improved after range of motion and muscle strengthening exercises. Follow all safety measures which are given to protect your hip. If any of these exercises cause increased pain or swelling in your joint, decrease the amount until you are comfortable again. Then slowly increase the exercises. Call your caregiver if you have problems or questions.  ° °HOME CARE INSTRUCTIONS  °Most of the following instructions are designed to prevent the dislocation of your new hip.  °Remove items at home which could result in a fall. This includes throw rugs or furniture in walking pathways.  °Continue medications as instructed at time of discharge. °· You may have some home medications which will be placed on hold until you complete the course of blood thinner medication. °· You may start showering once you are discharged home. Do not remove your dressing. °Do not put on socks or shoes without following the instructions of your caregivers.   °Sit on chairs with arms. Use the chair arms to help push yourself up when arising.  °Arrange for the use of a toilet seat elevator so you are not sitting low.  °· Walk with walker as instructed.  °You may resume a sexual relationship in one month or when given the OK by your caregiver.  °Use walker as long as suggested by your caregivers.  °You may put full weight on your legs and walk as much as is comfortable. °Avoid periods of inactivity such as sitting longer than an hour when not asleep. This helps prevent  blood clots.  °You may return to work once you are cleared by your surgeon.  °Do not drive a car for 6 weeks or until released by your surgeon.  °Do not drive while taking narcotics.  °Wear elastic stockings for two weeks following surgery during the day but you may remove then at night.  °Make sure you keep all of your appointments after your operation with all of your doctors and caregivers. You should call the office at the above phone number and make an appointment for approximately two weeks after the date of your surgery. °Please pick up a stool softener and laxative for home use as long as you are requiring pain medications. °· ICE to the affected hip every three hours for 30 minutes at a time and then as needed for pain and swelling. Continue to use ice on the hip for pain and swelling from surgery. You may notice swelling that will progress down to the foot and ankle.  This is normal after surgery.  Elevate the leg when you are not up walking on it.   °It is important for you to complete the blood thinner medication as prescribed by your doctor. °· Continue to use the breathing machine which will help keep your temperature down.  It is common for your temperature to cycle up and down following surgery, especially at night when you are not up moving around and exerting yourself.  The breathing machine keeps your lungs expanded and your temperature down. ° °RANGE OF MOTION AND STRENGTHENING EXERCISES  °These exercises   designed to help you keep full movement of your hip joint. Follow your caregiver's or physical therapist's instructions. Perform all exercises about fifteen times, three times per day or as directed. Exercise both hips, even if you have had only one joint replacement. These exercises can be done on a training (exercise) mat, on the floor, on a table or on a bed. Use whatever works the best and is most comfortable for you. Use music or television while you are exercising so that the exercises  are a pleasant break in your day. This will make your life better with the exercises acting as a break in routine you can look forward to.  °Lying on your back, slowly slide your foot toward your buttocks, raising your knee up off the floor. Then slowly slide your foot back down until your leg is straight again.  °Lying on your back spread your legs as far apart as you can without causing discomfort.  °Lying on your side, raise your upper leg and foot straight up from the floor as far as is comfortable. Slowly lower the leg and repeat.  °Lying on your back, tighten up the muscle in the front of your thigh (quadriceps muscles). You can do this by keeping your leg straight and trying to raise your heel off the floor. This helps strengthen the largest muscle supporting your knee.  °Lying on your back, tighten up the muscles of your buttocks both with the legs straight and with the knee bent at a comfortable angle while keeping your heel on the floor.  ° °SKILLED REHAB INSTRUCTIONS: °If the patient is transferred to a skilled rehab facility following release from the hospital, a list of the current medications will be sent to the facility for the patient to continue.  When discharged from the skilled rehab facility, please have the facility set up the patient's Home Health Physical Therapy prior to being released. Also, the skilled facility will be responsible for providing the patient with their medications at time of release from the facility to include their pain medication and their blood thinner medication. If the patient is still at the rehab facility at time of the two week follow up appointment, the skilled rehab facility will also need to assist the patient in arranging follow up appointment in our office and any transportation needs. ° °MAKE SURE YOU:  °Understand these instructions.  °Will watch your condition.  °Will get help right away if you are not doing well or get worse. ° °Pick up stool softner and  laxative for home use following surgery while on pain medications. °Do not remove your dressing. °The dressing is waterproof--it is OK to take showers. °Continue to use ice for pain and swelling after surgery. °Do not use any lotions or creams on the incision until instructed by your surgeon. °Total Hip Protocol. ° ° ° ° °Information on my medicine - Coumadin®   (Warfarin) ° °This medication education was reviewed with me or my healthcare representative as part of my discharge preparation.  The pharmacist that spoke with me during my hospital stay was:  Rayni Nemitz Dien, RPH ° °Why was Coumadin prescribed for you? °Coumadin was prescribed for you because you have a blood clot or a medical condition that can cause an increased risk of forming blood clots. Blood clots can cause serious health problems by blocking the flow of blood to the heart, lung, or brain. Coumadin can prevent harmful blood clots from forming. °As a reminder your indication for   Coumadin is:   Stroke Prevention Because Of Atrial Fibrillation ° °What test will check on my response to Coumadin? °While on Coumadin (warfarin) you will need to have an INR test regularly to ensure that your dose is keeping you in the desired range. The INR (international normalized ratio) number is calculated from the result of the laboratory test called prothrombin time (PT). ° °If an INR APPOINTMENT HAS NOT ALREADY BEEN MADE FOR YOU please schedule an appointment to have this lab work done by your health care provider within 7 days. °Your INR goal is usually a number between:  2 to 3 or your provider may give you a more narrow range like 2-2.5.  Ask your health care provider during an office visit what your goal INR is. ° °What  do you need to  know  About  COUMADIN? °Take Coumadin (warfarin) exactly as prescribed by your healthcare provider about the same time each day.  DO NOT stop taking without talking to the doctor who prescribed the medication.  Stopping without  other blood clot prevention medication to take the place of Coumadin may increase your risk of developing a new clot or stroke.  Get refills before you run out. ° °What do you do if you miss a dose? °If you miss a dose, take it as soon as you remember on the same day then continue your regularly scheduled regimen the next day.  Do not take two doses of Coumadin at the same time. ° °Important Safety Information °A possible side effect of Coumadin (Warfarin) is an increased risk of bleeding. You should call your healthcare provider right away if you experience any of the following: °? Bleeding from an injury or your nose that does not stop. °? Unusual colored urine (red or dark brown) or unusual colored stools (red or black). °? Unusual bruising for unknown reasons. °? A serious fall or if you hit your head (even if there is no bleeding). ° °Some foods or medicines interact with Coumadin® (warfarin) and might alter your response to warfarin. To help avoid this: °? Eat a balanced diet, maintaining a consistent amount of Vitamin K. °? Notify your provider about major diet changes you plan to make. °? Avoid alcohol or limit your intake to 1 drink for women and 2 drinks for men per day. °(1 drink is 5 oz. wine, 12 oz. beer, or 1.5 oz. liquor.) ° °Make sure that ANY health care provider who prescribes medication for you knows that you are taking Coumadin (warfarin).  Also make sure the healthcare provider who is monitoring your Coumadin knows when you have started a new medication including herbals and non-prescription products. ° °Coumadin® (Warfarin)  Major Drug Interactions  °Increased Warfarin Effect Decreased Warfarin Effect  °Alcohol (large quantities) °Antibiotics (esp. Septra/Bactrim, Flagyl, Cipro) °Amiodarone (Cordarone) °Aspirin (ASA) °Cimetidine (Tagamet) °Megestrol (Megace) °NSAIDs (ibuprofen, naproxen, etc.) °Piroxicam (Feldene) °Propafenone (Rythmol SR) °Propranolol (Inderal) °Isoniazid (INH) °Posaconazole  (Noxafil) Barbiturates (Phenobarbital) °Carbamazepine (Tegretol) °Chlordiazepoxide (Librium) °Cholestyramine (Questran) °Griseofulvin °Oral Contraceptives °Rifampin °Sucralfate (Carafate) °Vitamin K  ° °Coumadin® (Warfarin) Major Herbal Interactions  °Increased Warfarin Effect Decreased Warfarin Effect  °Garlic °Ginseng °Ginkgo biloba Coenzyme Q10 °Green tea °St. John’s wort   ° °Coumadin® (Warfarin) FOOD Interactions  °Eat a consistent number of servings per week of foods HIGH in Vitamin K °(1 serving = ½ cup)  °Collards (cooked, or boiled & drained) °Kale (cooked, or boiled & drained) °Mustard greens (cooked, or boiled & drained) °Parsley *serving size only = ¼   cup °Spinach (cooked, or boiled & drained) °Swiss chard (cooked, or boiled & drained) °Turnip greens (cooked, or boiled & drained)  °Eat a consistent number of servings per week of foods MEDIUM-HIGH in Vitamin K °(1 serving = 1 cup)  °Asparagus (cooked, or boiled & drained) °Broccoli (cooked, boiled & drained, or raw & chopped) °Brussel sprouts (cooked, or boiled & drained) *serving size only = ½ cup °Lettuce, raw (green leaf, endive, romaine) °Spinach, raw °Turnip greens, raw & chopped  ° °These websites have more information on Coumadin (warfarin):  www.coumadin.com; °www.ahrq.gov/consumer/coumadin.htm; ° ° ° ° °

## 2017-08-30 LAB — CBC
HCT: 34.3 % — ABNORMAL LOW (ref 36.0–46.0)
Hemoglobin: 10.7 g/dL — ABNORMAL LOW (ref 12.0–15.0)
MCH: 26.9 pg (ref 26.0–34.0)
MCHC: 31.2 g/dL (ref 30.0–36.0)
MCV: 86.2 fL (ref 78.0–100.0)
Platelets: 160 10*3/uL (ref 150–400)
RBC: 3.98 MIL/uL (ref 3.87–5.11)
RDW: 14 % (ref 11.5–15.5)
WBC: 7.2 10*3/uL (ref 4.0–10.5)

## 2017-08-30 LAB — BASIC METABOLIC PANEL
Anion gap: 6 (ref 5–15)
BUN: 18 mg/dL (ref 6–20)
CALCIUM: 8.6 mg/dL — AB (ref 8.9–10.3)
CO2: 27 mmol/L (ref 22–32)
CREATININE: 0.84 mg/dL (ref 0.44–1.00)
Chloride: 104 mmol/L (ref 101–111)
GFR calc non Af Amer: >60 mL/min (ref >60)
Glucose, Bld: 127 mg/dL — ABNORMAL HIGH (ref 65–99)
Potassium: 3.8 mmol/L (ref 3.5–5.1)
Sodium: 137 mmol/L (ref 135–145)

## 2017-08-30 LAB — PROTIME-INR
INR: 1.24
Prothrombin Time: 15.5 seconds — ABNORMAL HIGH (ref 11.4–15.2)

## 2017-08-30 MED ORDER — WARFARIN SODIUM 7.5 MG PO TABS
7.5000 mg | ORAL_TABLET | Freq: Once | ORAL | Status: AC
  Administered 2017-08-30: 7.5 mg via ORAL
  Filled 2017-08-30: qty 1

## 2017-08-30 NOTE — Progress Notes (Signed)
  08/30/2017 2 Days Post-Op Procedure(s) (LRB): TOTAL HIP ARTHROPLASTY ANTERIOR APPROACH (Left) Patient reports pain as mild.   Patient seen in rounds with Dr. Wynelle Link. Patient is well, and has had no acute complaints or problems. States the left hip is feeling better this AM. Denies chest pain or SOB. Voiding without difficulty and positive flatus.  They will be WBAT. Plan is to go Home after hospital stay.  Vital signs in last 24 hours: Temp:  [98.3 F (36.8 C)-98.4 F (36.9 C)] 98.4 F (36.9 C) (06/15 0430) Pulse Rate:  [75-91] 75 (06/15 0430) Resp:  [13-18] 13 (06/15 0430) BP: (104-118)/(64-73) 118/68 (06/15 0430) SpO2:  [95 %-96 %] 95 % (06/15 0430)  I&O's: I/O last 3 completed shifts: In: 240 [P.O.:240] Out: -  No intake/output data recorded.  Labs: Recent Labs    08/28/17 0118 08/29/17 0522 08/30/17 0600  HGB 12.9 11.6* 10.7*   Recent Labs    08/29/17 0522 08/30/17 0600  WBC 8.3 7.2  RBC 4.32 3.98  HCT 36.8 34.3*  PLT 168 160   Recent Labs    08/29/17 0522 08/30/17 0600  NA 136 137  K 4.4 3.8  CL 105 104  CO2 27 27  BUN 17 18  CREATININE 0.85 0.84  GLUCOSE 160* 127*  CALCIUM 8.5* 8.6*   Recent Labs    08/29/17 0524 08/30/17 0600  INR 1.09 1.24     EXAM: General - Patient is Alert and Oriented Extremity - Neurologically intact Neurovascular intact Sensation intact distally Dorsiflexion/Plantar flexion intact Dressing - Prevena VAC in place Motor Function - intact, moving foot and toes well on exam.   Past Medical History:  Diagnosis Date  . Anxiety   . Atrial fibrillation ([place])    longstanding persistent  . 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 Risa Grill  . Colon polyps   . Depression   . GERD (gastroesophageal reflux disease)   . Headache(784.0)    hx of migraines  . Hematuria    hx  . HLD (hyperlipidemia)    mixed  . HTN (hypertension)   . Hypothyroidism   . Insomnia   . Liver function test  abnormality    hx fatty liver  . Murmur, cardiac   . Nonalcoholic fatty liver disease   . Obesity   . Osteoarthritis    low back pain with left sciatica  . Osteopenia    dexa 2013  . Renal calculus    hx  . Restless leg syndrome     Assessment/Plan: 2 Days Post-Op Procedure(s) (LRB): TOTAL HIP ARTHROPLASTY ANTERIOR APPROACH (Left) Principal Problem:   Hip fracture ([facility]) Active Problems:   Hypothyroidism   Essential hypertension, benign   Atrial fibrillation ([facility])   Long term (current) use of anticoagulants   Hyperlipidemia LDL goal <100   Closed displaced fracture of left femoral neck ([facility])  Estimated body mass index is 33.93 kg/m as calculated from the following:   Height as of this encounter: 5\' 5"  (1.651 m).   Weight as of this encounter: 92.5 kg (203 lb 14.8 oz). Up with therapy  DVT Prophylaxis - Coumadin with lovenox bridge (D/C lovenox when INR > 1.8).  INR 1.24 this AM, check tomorrow AM. Weight-Bearing as tolerated  Plan for discharge to home with HHPT on Sunday if meeting goals with therapy and stable.  Theresa Duty, PA-C Orthopaedic Surgery 08/30/2017, 9:28 AM

## 2017-08-30 NOTE — Progress Notes (Signed)
ANTICOAGULATION CONSULT NOTE  Pharmacy Consult:  Coumadin Indication: atrial fibrillation  Allergies  Allergen Reactions  . Ibandronate Sodium Other (See Comments)    Bones hurt  . Pravachol [Pravastatin Sodium] Other (See Comments)    Myalgias   . Ultram [Tramadol Hcl] Nausea Only   Patient Measurements: Height: 5\' 5"  (165.1 cm) Weight: 203 lb 14.8 oz (92.5 kg) IBW/kg (Calculated) : 57  Vital Signs: Temp: 98.4 F (36.9 C) (06/15 0430) Temp Source: Oral (06/15 0430) BP: 112/68 (06/15 0949) Pulse Rate: 65 (06/15 0952)  Labs: Recent Labs    08/27/17 1942  08/28/17 0118 08/29/17 0522 08/29/17 0524 08/30/17 0600  HGB  --    < > 12.9 11.6*  --  10.7*  HCT  --   --  40.9 36.8  --  34.3*  PLT  --   --  159 168  --  160  LABPROT  --   --  15.2  --  14.1 15.5*  INR  --   --  1.21  --  1.09 1.24  HEPARINUNFRC 0.12*  --  0.43  --   --   --   CREATININE  --   --   --  0.85  --  0.84   < > = values in this interval not displayed.    Estimated Creatinine Clearance: 66 mL/min (by C-G formula based on SCr of 0.84 mg/dL).   Assessment: 72 y.o. Jennifer House with history of Afib on Coumadin PTA.  Patient presented s/p fall and Coumadin was reversed with Vitamin K on 08/26/17.  She was on IV heparin bridge briefly before left hip arthroplasty on 08/28/17.  Coumadin resumed on 08/28/17 and Lovenox bridge started today 08/29/17.   INR today remains SUBtherapeutic however trending up (INR 1.24 << 1.09, goal of 2-3). Hgb/Hct slight drop, plts wnl. No overt bleeding reported.    Home Coumadin dose: 5mg  daily except 7.5mg  on Sunday  Goal of Therapy:  INR 2 - 3 Monitor platelets by anticoagulation protocol: Yes   Plan:  Coumadin 7.5mg  PO today Lovenox 40mg  SQ Q24H.  D/C when INR >/= 1.8 per Ortho Daily PT / INR  Thank you for allowing pharmacy to be a part of this patient's care.  Alycia Rossetti, PharmD, BCPS Clinical Pharmacist Pager: [phone] Clinical phone for 08/30/2017 from  7a-3:30p: [phone] If after 3:30p, please call main pharmacy at: [phone] 08/30/2017 10:49 AM

## 2017-08-30 NOTE — Progress Notes (Signed)
PROGRESS NOTE   Jennifer House  NUU:[id number]    DOB: May 21, 1945    DOA: 08/25/2017  PCP: Ma Hillock, DO   I have briefly reviewed patients previous medical records in [facility].  Brief Summary:  72 year old married female, independent of activities, PMH of permanent A. fib on Coumadin (Dr. Rayann Heman, EP Cardiology), HTN, HLD, hypothyroid, fatty liver, anxiety and depression, GERD, obesity, fell out of her bed while asleep and presented with left hip pain/fracture.  Anticoagulated on admission.  Orthopedics consulted.  Anticoagulation reversed with vitamin K.  Status post left total hip arthroplasty, anterior approach on 6/13.  08/30/2017: INR is 1.24 today.  Likely DC home when INR is optimized.  Goal INR is 2-3.  Patient has history of atrial fibrillation.   Assessment & Plan:   Principal Problem:   Hip fracture ([place]) Active Problems:   Hypothyroidism   Essential hypertension, benign   Atrial fibrillation (HCC)   Long term (current) use of anticoagulants   Hyperlipidemia LDL goal <100   Closed displaced fracture of left femoral neck (HCC)   Left femoral neck fracture - Sustained post mechanical fall from bed while asleep at home. - Orthopedics was consulted and surgery was delayed while Coumadin anticoagulation was being reversed.  -Patient underwent left total hip arthroplasty, anterior approach on 6/13.  Management per orthopedics: Weightbearing as tolerated with walker, Coumadin with Lovenox bridge for DVT prophylaxis, PT and OT.  Acute blood loss anemia Follow CBC in a.m. and transfuse if hemoglobin 7 or less. -08/30/2017: Hemoglobin today 7.2.  Continue to monitor.  Permanent A. fib - Rate controlled. -Patient's INR was reversed with vitamin K in preparation for surgery.  She was briefly on IV heparin drip which was turned off several hours prior to surgery.  Postop she was started on Lovenox 40 mg along with Coumadin per pharmacy. - Patient last seen by her  cardiologist on 08/18/2017.   - TTE 08/26/2017: LVEF 60-65%, trivial AR. - CHA2DS2 Vasc score: 3 -I discussed with Dr. Lyla Glassing who recommends continuing prophylactic dose Lovenox 40 mg daily but no full dose Lovenox due to increased bleeding risk.  DC Lovenox when INR therapeutic 2 or greater. -08/30/2017: INR is 1.24.  Essential hypertension - Controlled.  Continue amlodipine and metoprolol.  ACEI held.  Hyperlipidemia - Continue Zetia.  Hypothyroid:  - Clinically euthyroid.  Anxiety and depression - Stable.  Continue Lexapro.  Obesity  Left lung opacity - Noted on chest x-ray.  Followed up with CT chest without contrast which showed no lung masses or significant pulmonary nodules.  Scarring versus atelectasis left lower lung area coronary atherosclerosis noted.  Incentive spirometry, patient has been aggressively using.    DVT prophylaxis: Lovenox bridging and Coumadin. Code Status: Full Family Communication: Discussed in detail with patient's daughter at bedside.  Updated care and answered questions. Disposition: Pending clinical improvement, possibly in the next 48 hours.   Consultants:  Orthopedics.  Procedures:  Left lower extremity traction  Antimicrobials:  None   Subjective: No new complaints.  She will be discharged once INR is optimized.    Objective:  Vitals:   08/29/17 2007 08/30/17 0430 08/30/17 0949 08/30/17 0952  BP: 111/73 118/68 112/68   Pulse: 91 75  65  Resp: 17 13    Temp: 98.3 F (36.8 C) 98.4 F (36.9 C)    TempSrc: Oral Oral    SpO2: 95% 95%    Weight:      Height:  Examination:  General exam: Pleasant elderly female, moderately built and obese, sitting up comfortably in chair. Respiratory system: Clear to auscultation. Respiratory effort normal.  Stable Cardiovascular system: S1 & S2 heard, irregularly irregular. No JVD, murmurs, rubs, gallops or clicks. No pedal edema.  Stable Gastrointestinal system: Abdomen is  nondistended, soft and nontender. No organomegaly or masses felt. Normal bowel sounds heard.  Stable Central nervous system: Alert and oriented. No focal neurological deficits.  Stable. Extremities: Symmetric 5 x 5 power except left lower extremity where movements still restricted by postop pain.  Surgical site dressing clean and dry. Skin: No rashes, lesions or ulcers Psychiatry: Judgement and insight appear normal. Mood & affect appropriate.     Data Reviewed: I have personally reviewed following labs and imaging studies  CBC: Recent Labs  Lab 08/25/17 0835 08/27/17 0724 08/28/17 0118 08/29/17 0522 08/30/17 0600  WBC 6.4 7.2 6.8 8.3 7.2  NEUTROABS 4.2  --   --   --   --   HGB 13.0 12.9 12.9 11.6* 10.7*  HCT 41.3 42.0 40.9 36.8 34.3*  MCV 85.7 85.5 86.1 85.2 86.2  PLT 180 166 159 168 354   Basic Metabolic Panel: Recent Labs  Lab 08/25/17 0835 08/27/17 0724 08/29/17 0522 08/30/17 0600  NA 140 140 136 137  K 3.8 3.9 4.4 3.8  CL 108 106 105 104  CO2 25 26 27 27   GLUCOSE 152* 96 160* 127*  BUN 14 13 17 18   CREATININE 0.82 0.81 0.85 0.84  CALCIUM 9.1 9.0 8.5* 8.6*   Coagulation Profile: Recent Labs  Lab 08/27/17 0724 08/27/17 1051 08/28/17 0118 08/29/17 0524 08/30/17 0600  INR >10.00* 1.31 1.21 1.09 1.24     Recent Results (from the past 240 hour(s))  MRSA PCR Screening     Status: None   Collection Time: 08/26/17 10:21 PM  Result Value Ref Range Status   MRSA by PCR NEGATIVE NEGATIVE Final    Comment:        The GeneXpert MRSA Assay (FDA approved for NASAL specimens only), is one component of a comprehensive MRSA colonization surveillance program. It is not intended to diagnose MRSA infection nor to guide or monitor treatment for MRSA infections. Performed at [facility], [place] [address], [place], [place] [address]          Radiology Studies: Pelvis Portable  Result Date: 08/28/2017 CLINICAL DATA:  Post left total hip replacement.  EXAM: PORTABLE PELVIS 1-2 VIEWS COMPARISON:  08/25/2017 and earlier same day. FINDINGS: Examination demonstrates patient's recent left total hip arthroplasty with prosthetic components normally located and intact. Mild degenerate change of the right hip and spine. IMPRESSION: Expected changes post left total hip arthroplasty. Electronically Signed   By: Marin Olp M.D.   On: 08/28/2017 19:44   Dg C-arm 1-60 Min  Result Date: 08/28/2017 CLINICAL DATA:  Hip arthroplasty EXAM: OPERATIVE left HIP (WITH PELVIS IF PERFORMED) 2 VIEWS TECHNIQUE: Fluoroscopic spot image(s) were submitted for interpretation post-operatively. COMPARISON:  08/25/2017 FINDINGS: Total fluoroscopy time was 17 seconds. Two low resolution intraoperative spot views of the left hip. The images demonstrate a left hip replacement with normal alignment. IMPRESSION: Intraoperative fluoroscopic assistance provided during left hip replacement Electronically Signed   By: Donavan Foil M.D.   On: 08/28/2017 19:35   Dg Hip Operative Unilat W Or W/o Pelvis Left  Result Date: 08/28/2017 CLINICAL DATA:  Hip arthroplasty EXAM: OPERATIVE left HIP (WITH PELVIS IF PERFORMED) 2 VIEWS TECHNIQUE: Fluoroscopic spot image(s) were submitted  for interpretation post-operatively. COMPARISON:  08/25/2017 FINDINGS: Total fluoroscopy time was 17 seconds. Two low resolution intraoperative spot views of the left hip. The images demonstrate a left hip replacement with normal alignment. IMPRESSION: Intraoperative fluoroscopic assistance provided during left hip replacement Electronically Signed   By: Donavan Foil M.D.   On: 08/28/2017 19:35        Scheduled Meds: . amLODipine  10 mg Oral Daily  . docusate sodium  100 mg Oral BID  . enoxaparin (LOVENOX) injection  40 mg Subcutaneous Q24H  . escitalopram  20 mg Oral Daily  . ezetimibe  10 mg Oral Daily  . hydrOXYzine  25 mg Oral QHS  . levothyroxine  75 mcg Oral QPC breakfast  . metoprolol tartrate  25 mg  Oral BID  . traZODone  100 mg Oral QHS  . warfarin  7.5 mg Oral ONCE-1800  . Warfarin - Pharmacist Dosing Inpatient   Does not apply q1800   Continuous Infusions: . methocarbamol (ROBAXIN)  IV       LOS: 5 days     Bonnell Public, MD. [facility] Pager [phone]  If 7PM-7AM, please contact night-coverage [email or web address] Password [facility] 08/30/2017, 12:22 PM

## 2017-08-30 NOTE — Progress Notes (Addendum)
Physical Therapy Treatment Patient Details Name: Jennifer House MRN: [id number] DOB: [birth year] Today's Date: 08/30/2017    History of Present Illness Pt is a 72 y/o female who sustained a fall at home and is now s/p L THA direct anterior approach. PMH including but not limited to HTN, HLD and a-fib.    PT Comments    Pt making steady progress but limited with ambulation this session secondary to fatigue and pain. PT will continue to follow acutely for mobility progression and stair training. Pt would continue to benefit from skilled physical therapy services at this time while admitted and after d/c to address the below listed limitations in order to improve overall safety and independence with functional mobility.    Follow Up Recommendations  Home health PT;Supervision/Assistance - 24 hour     Equipment Recommendations  Rolling walker with 5" wheels;3in1 (PT)    Recommendations for Other Services       Precautions / Restrictions Precautions Precautions: Fall Precaution Comments: wound VAC L hip Restrictions Weight Bearing Restrictions: Yes LLE Weight Bearing: Weight bearing as tolerated    Mobility  Bed Mobility Overal bed mobility: Needs Assistance Bed Mobility: Supine to Sit     Supine to sit: Min assist     General bed mobility comments: increased time and effort, assist with L LE movement off of bed and use of bed pads to assist with positioning at EOB  Transfers Overall transfer level: Needs assistance Equipment used: Rolling walker (2 wheeled) Transfers: Sit to/from Stand Sit to Stand: Min guard         General transfer comment: increased time and effort, cueing for safe hand placement, min guard for safety with rise into standing from recliner chair  Ambulation/Gait Ambulation/Gait assistance: Min guard Gait Distance (Feet): 15 Feet Assistive device: Rolling walker (2 wheeled) Gait Pattern/deviations: Step-to pattern;Decreased step length -  right;Decreased stance time - left;Decreased stride length;Decreased weight shift to left;Trunk flexed Gait velocity: decreased Gait velocity interpretation: <1.31 ft/sec, indicative of household ambulator General Gait Details: cueing for sequencing with RW, close min guard for safety and line management with wound VAC, pt limited secondary to fatigue and pain   Stairs             Wheelchair Mobility    Modified Rankin (Stroke Patients Only)       Balance Overall balance assessment: Needs assistance Sitting-balance support: Feet supported Sitting balance-Leahy Scale: Good     Standing balance support: During functional activity;Bilateral upper extremity supported Standing balance-Leahy Scale: Poor                              Cognition Arousal/Alertness: Awake/alert Behavior During Therapy: WFL for tasks assessed/performed Overall Cognitive Status: Within Functional Limits for tasks assessed                                        Exercises Total Joint Exercises Quad Sets: AROM;Strengthening;Left;10 reps;Seated Gluteal Sets: AROM;Both;Seated Hip ABduction/ADduction: AAROM;Left;10 reps;Seated    General Comments        Pertinent Vitals/Pain Pain Assessment: Faces Faces Pain Scale: Hurts little more Pain Location: L hip Pain Descriptors / Indicators: Sore Pain Intervention(s): Monitored during session;Repositioned    Home Living                      Prior  Function            PT Goals (current goals can now be found in the care plan section) Acute Rehab PT Goals PT Goal Formulation: With patient/family Time For Goal Achievement: 09/12/17 Potential to Achieve Goals: Good Progress towards PT goals: Progressing toward goals    Frequency    7X/week      PT Plan Current plan remains appropriate    Co-evaluation              AM-PAC PT "6 Clicks" Daily Activity  Outcome Measure  Difficulty turning over  in bed (including adjusting bedclothes, sheets and blankets)?: Unable Difficulty moving from lying on back to sitting on the side of the bed? : Unable Difficulty sitting down on and standing up from a chair with arms (e.g., wheelchair, bedside commode, etc,.)?: Unable Help needed moving to and from a bed to chair (including a wheelchair)?: A Little Help needed walking in hospital room?: A Little Help needed climbing 3-5 steps with a railing? : A Little 6 Click Score: 12    End of Session Equipment Utilized During Treatment: Gait belt Activity Tolerance: Patient limited by pain;Patient limited by fatigue Patient left: in chair;with call bell/phone within reach;with family/visitor present Nurse Communication: Mobility status PT Visit Diagnosis: Other abnormalities of gait and mobility (R26.89)     Time: 3875-6433 PT Time Calculation (min) (ACUTE ONLY): 20 min  Charges:  $Gait Training: 8-22 mins                    G Codes:       [place], [place], [place] 08/30/2017, 10:29 AM

## 2017-08-30 NOTE — Progress Notes (Signed)
Physical Therapy Treatment Patient Details Name: Jennifer House MRN: [id number] DOB: [birth year] Today's Date: 08/30/2017    History of Present Illness Pt is a 72 y/o female who sustained a fall at home and is now s/p L THA direct anterior approach. PMH including but not limited to HTN, HLD and a-fib.    PT Comments    Pt making steady progress with mobility. Will need stair training prior to d/c home. Pt would continue to benefit from skilled physical therapy services at this time while admitted and after d/c to address the below listed limitations in order to improve overall safety and independence with functional mobility.    Follow Up Recommendations  Home health PT;Supervision/Assistance - 24 hour     Equipment Recommendations  Rolling walker with 5" wheels;3in1 (PT)    Recommendations for Other Services       Precautions / Restrictions Precautions Precautions: Fall Precaution Comments: wound VAC L hip Restrictions Weight Bearing Restrictions: Yes LLE Weight Bearing: Weight bearing as tolerated    Mobility  Bed Mobility Overal bed mobility: Needs Assistance Bed Mobility: Supine to Sit     Supine to sit: Min assist     General bed mobility comments: increased time and effort, assist with L LE movement off of bed and use of bed pads to assist with positioning at EOB  Transfers Overall transfer level: Needs assistance Equipment used: Rolling walker (2 wheeled) Transfers: Sit to/from Stand Sit to Stand: Min guard         General transfer comment: increased time and effort, good technique, min guard; pt performed x1 from EOB and x1 from recliner chair  Ambulation/Gait Ambulation/Gait assistance: Min guard Gait Distance (Feet): 30 Feet(15' x2 with sitting rest break) Assistive device: Rolling walker (2 wheeled) Gait Pattern/deviations: Step-to pattern;Decreased step length - right;Decreased stance time - left;Decreased stride length;Decreased weight shift to  left;Trunk flexed Gait velocity: decreased Gait velocity interpretation: <1.31 ft/sec, indicative of household ambulator General Gait Details: cueing for sequencing with RW, close min guard for safety and line management with wound VAC, pt limited secondary to fatigue and pain; pt with increased WOB at end of second ambulation with SPO2 at 98% and HR at 72   Stairs             Wheelchair Mobility    Modified Rankin (Stroke Patients Only)       Balance Overall balance assessment: Needs assistance Sitting-balance support: Feet supported Sitting balance-Leahy Scale: Good     Standing balance support: During functional activity;Bilateral upper extremity supported Standing balance-Leahy Scale: Poor                              Cognition Arousal/Alertness: Awake/alert Behavior During Therapy: WFL for tasks assessed/performed Overall Cognitive Status: Within Functional Limits for tasks assessed                                        Exercises      General Comments        Pertinent Vitals/Pain Pain Assessment: Faces Faces Pain Scale: Hurts a little bit Pain Location: L hip Pain Descriptors / Indicators: Sore Pain Intervention(s): Monitored during session;Repositioned;Patient requesting pain meds-RN notified    Home Living                      Prior  Function            PT Goals (current goals can now be found in the care plan section) Acute Rehab PT Goals PT Goal Formulation: With patient/family Time For Goal Achievement: 09/12/17 Potential to Achieve Goals: Good Progress towards PT goals: Progressing toward goals    Frequency    7X/week      PT Plan Current plan remains appropriate    Co-evaluation              AM-PAC PT "6 Clicks" Daily Activity  Outcome Measure  Difficulty turning over in bed (including adjusting bedclothes, sheets and blankets)?: Unable Difficulty moving from lying on back to  sitting on the side of the bed? : Unable Difficulty sitting down on and standing up from a chair with arms (e.g., wheelchair, bedside commode, etc,.)?: Unable Help needed moving to and from a bed to chair (including a wheelchair)?: A Little Help needed walking in hospital room?: A Little Help needed climbing 3-5 steps with a railing? : A Little 6 Click Score: 12    End of Session Equipment Utilized During Treatment: Gait belt Activity Tolerance: Patient limited by pain;Patient limited by fatigue Patient left: in chair;with call bell/phone within reach;with family/visitor present Nurse Communication: Mobility status;Patient requests pain meds PT Visit Diagnosis: Other abnormalities of gait and mobility (R26.89)     Time: 7867-5449 PT Time Calculation (min) (ACUTE ONLY): 28 min  Charges:  $Gait Training: 8-22 mins $Therapeutic Activity: 8-22 mins                    G Codes:       [place], [place], [place] 08/30/2017, 5:16 PM

## 2017-08-30 NOTE — Care Management Note (Signed)
Case Management Note  Patient Details  Name: Jennifer House MRN: [id number] Date of Birth: [birth year]  Subjective/Objective:   72 yr old female s/p fall at home which resulted in a left hip fracture. Patient underwent a left total hip arthroplasty.                 Action/Plan: Case manager spoke with patient and daughter concerning discharge plan and DME. Patient's was setup with [facility], no changes. Patient  Will have family support at discharge.   Expected Discharge Date:    08/30/17              Expected Discharge Plan:  [place]  In-House Referral:  NA  Discharge planning Services  CM Consult  Post Acute Care Choice:  Durable Medical Equipment, Home Health Choice offered to:  Patient, Adult Children  DME Arranged:  3-N-1, Walker rolling DME Agency:  [place]:  [place] Agency:  [facility] (formerly [facility])  Status of Service:  Completed, signed off  If discussed at [employer], dates discussed:    Additional Comments:  Ninfa Meeker, RN 08/30/2017, 1:21 PM

## 2017-08-31 ENCOUNTER — Inpatient Hospital Stay (HOSPITAL_COMMUNITY): Payer: Medicare Other

## 2017-08-31 LAB — CBC
HEMATOCRIT: 34 % — AB (ref 36.0–46.0)
Hemoglobin: 10.5 g/dL — ABNORMAL LOW (ref 12.0–15.0)
MCH: 27 pg (ref 26.0–34.0)
MCHC: 30.9 g/dL (ref 30.0–36.0)
MCV: 87.4 fL (ref 78.0–100.0)
Platelets: 156 10*3/uL (ref 150–400)
RBC: 3.89 MIL/uL (ref 3.87–5.11)
RDW: 14.1 % (ref 11.5–15.5)
WBC: 7.2 10*3/uL (ref 4.0–10.5)

## 2017-08-31 LAB — PROTIME-INR
INR: 1.43
Prothrombin Time: 17.3 seconds — ABNORMAL HIGH (ref 11.4–15.2)

## 2017-08-31 MED ORDER — WARFARIN SODIUM 7.5 MG PO TABS
7.5000 mg | ORAL_TABLET | Freq: Once | ORAL | Status: AC
Start: 2017-08-31 — End: 2017-08-31
  Administered 2017-08-31: 7.5 mg via ORAL
  Filled 2017-08-31: qty 1

## 2017-08-31 NOTE — Progress Notes (Signed)
ANTICOAGULATION CONSULT NOTE  Pharmacy Consult:  Coumadin Indication: atrial fibrillation  Patient Measurements: Height: 5\' 5"  (165.1 cm) Weight: 203 lb 14.8 oz (92.5 kg) IBW/kg (Calculated) : 57  Vital Signs: Temp: 98.5 F (36.9 C) (06/16 0515) Temp Source: Oral (06/16 0515) BP: 128/65 (06/16 0515) Pulse Rate: 72 (06/16 0515)  Labs: Recent Labs    08/29/17 0522 08/29/17 0524 08/30/17 0600 08/31/17 0253  HGB 11.6*  --  10.7* 10.5*  HCT 36.8  --  34.3* 34.0*  PLT 168  --  160 156  LABPROT  --  14.1 15.5* 17.3*  INR  --  1.09 1.24 1.43  CREATININE 0.85  --  0.84  --     Estimated Creatinine Clearance: 66 mL/min (by C-G formula based on SCr of 0.84 mg/dL).   Assessment: 72 y.o. female with history of Afib on Coumadin PTA.  Patient presented s/p fall and Coumadin was reversed with Vitamin K on 08/26/17.  She was on IV heparin bridge briefly before left hip arthroplasty on 08/28/17.  Coumadin resumed on 08/28/17 and Lovenox bridge for VTE px started 08/29/17.   INR today remains SUBtherapeutic however trending up (INR 1.43 << 1.24, goal of 2-3). Hgb/Hct slight drop, plts wnl. No overt bleeding reported.    Home Coumadin dose: 5mg  daily except 7.5mg  on Sunday  Goal of Therapy:  INR 2 - 3 Monitor platelets by anticoagulation protocol: Yes   Plan:  Coumadin 7.5mg  PO today Lovenox 40mg  SQ Q24H.  D/C when INR >/= 1.8 per Ortho Daily PT / INR  Thank you for allowing pharmacy to be a part of this patient's care.  Alycia Rossetti, PharmD, BCPS Clinical Pharmacist Pager: [phone] Clinical phone for 08/31/2017 from 7a-3:30p: [phone] If after 3:30p, please call main pharmacy at: [phone] 08/31/2017 11:13 AM

## 2017-08-31 NOTE — Progress Notes (Signed)
PROGRESS NOTE   Jennifer House  EHM:[id number]    DOB: 11-16-1945    DOA: 08/25/2017  PCP: Ma Hillock, DO   I have briefly reviewed patients previous medical records in [facility].  Brief Summary:  72 year old married female, independent of activities, PMH of permanent A. fib on Coumadin (Dr. Rayann Heman, EP Cardiology), HTN, HLD, hypothyroid, fatty liver, anxiety and depression, GERD, obesity, fell out of her bed while asleep and presented with left hip pain/fracture.  Anticoagulated on admission.  Orthopedics consulted.  Anticoagulation reversed with vitamin K.  Status post left total hip arthroplasty, anterior approach on 6/13.  08/30/2017: INR is 1.24 today.  Likely DC home when INR is optimized.  Goal INR is 2-3.  Patient has history of atrial fibrillation.  08/31/2017: INR today is 1.43.  Likely, patient will be discharged back home when INR is optimized.   Assessment & Plan:   Principal Problem:   Hip fracture ([place]) Active Problems:   Hypothyroidism   Essential hypertension, benign   Atrial fibrillation (HCC)   Long term (current) use of anticoagulants   Hyperlipidemia LDL goal <100   Closed displaced fracture of left femoral neck (HCC)   Left femoral neck fracture - Sustained post mechanical fall from bed while asleep at home. - Orthopedics was consulted and surgery was delayed while Coumadin anticoagulation was being reversed.  -Patient underwent left total hip arthroplasty, anterior approach on 6/13.  Management per orthopedics: Weightbearing as tolerated with walker, Coumadin with Lovenox bridge for DVT prophylaxis, PT and OT.  Acute blood loss anemia Follow CBC in a.m. and transfuse if hemoglobin 7 or less. -08/30/2017: Hemoglobin today 10.5 g/dL.    Permanent A. fib - Rate controlled. -Patient's INR was reversed with vitamin K in preparation for surgery.  She was briefly on IV heparin drip which was turned off several hours prior to surgery.  Postop she was  started on Lovenox 40 mg along with Coumadin per pharmacy. - Patient last seen by her cardiologist on 08/18/2017.   - TTE 08/26/2017: LVEF 60-65%, trivial AR. - CHA2DS2 Vasc score: 3 -I discussed with Dr. Lyla Glassing who recommends continuing prophylactic dose Lovenox 40 mg daily but no full dose Lovenox due to increased bleeding risk.  DC Lovenox when INR therapeutic 2 or greater. -08/30/2017: INR is 1.24. -08/31/2016: INR today is 1.43.  Likely DC home when INR is optimized.  Essential hypertension - Controlled.  Continue amlodipine and metoprolol.  ACEI held.  Hyperlipidemia - Continue Zetia.  Hypothyroid:  - Clinically euthyroid.  Anxiety and depression - Stable.  Continue Lexapro.  Obesity  Left lung opacity - Noted on chest x-ray.  Followed up with CT chest without contrast which showed no lung masses or significant pulmonary nodules.  Scarring versus atelectasis left lower lung area coronary atherosclerosis noted.  Incentive spirometry, patient has been aggressively using.    DVT prophylaxis: Lovenox bridging and Coumadin. Code Status: Full Family Communication: Discussed in detail with patient's daughter at bedside.  Updated care and answered questions. Disposition: Pending clinical improvement, possibly in the next 48 hours.   Consultants:  Orthopedics.  Procedures:  Left lower extremity traction  Antimicrobials:  None   Subjective: No new complaints.  She will be discharged once INR is optimized.    Objective:  Vitals:   08/30/17 2026 08/31/17 0515 08/31/17 1136 08/31/17 1505  BP: 132/69 128/65 112/68 (!) 118/59  Pulse: 81 72  63  Resp: 15 14  20   Temp: 99.1 F (37.3  C) 98.5 F (36.9 C)    TempSrc: Oral Oral    SpO2: 93% 96%  96%  Weight:      Height:        Examination:  General exam: Pleasant elderly female, moderately built and obese, sitting up comfortably in chair. Respiratory system: Clear to auscultation. Respiratory effort normal.   Stable Cardiovascular system: S1 & S2 heard, irregularly irregular. No JVD, murmurs, rubs, gallops or clicks. No pedal edema.  Stable Gastrointestinal system: Abdomen is nondistended, soft and nontender. No organomegaly or masses felt. Normal bowel sounds heard.  Stable Central nervous system: Alert and oriented. No focal neurological deficits.  Stable. Extremities: Symmetric 5 x 5 power except left lower extremity where movements still restricted by postop pain.  Surgical site dressing clean and dry. Skin: No rashes, lesions or ulcers Psychiatry: Judgement and insight appear normal. Mood & affect appropriate.     Data Reviewed: I have personally reviewed following labs and imaging studies  CBC: Recent Labs  Lab 08/25/17 0835 08/27/17 0724 08/28/17 0118 08/29/17 0522 08/30/17 0600 08/31/17 0253  WBC 6.4 7.2 6.8 8.3 7.2 7.2  NEUTROABS 4.2  --   --   --   --   --   HGB 13.0 12.9 12.9 11.6* 10.7* 10.5*  HCT 41.3 42.0 40.9 36.8 34.3* 34.0*  MCV 85.7 85.5 86.1 85.2 86.2 87.4  PLT 180 166 159 168 160 614   Basic Metabolic Panel: Recent Labs  Lab 08/25/17 0835 08/27/17 0724 08/29/17 0522 08/30/17 0600  NA 140 140 136 137  K 3.8 3.9 4.4 3.8  CL 108 106 105 104  CO2 25 26 27 27   GLUCOSE 152* 96 160* 127*  BUN 14 13 17 18   CREATININE 0.82 0.81 0.85 0.84  CALCIUM 9.1 9.0 8.5* 8.6*   Coagulation Profile: Recent Labs  Lab 08/27/17 1051 08/28/17 0118 08/29/17 0524 08/30/17 0600 08/31/17 0253  INR 1.31 1.21 1.09 1.24 1.43     Recent Results (from the past 240 hour(s))  MRSA PCR Screening     Status: None   Collection Time: 08/26/17 10:21 PM  Result Value Ref Range Status   MRSA by PCR NEGATIVE NEGATIVE Final    Comment:        The GeneXpert MRSA Assay (FDA approved for NASAL specimens only), is one component of a comprehensive MRSA colonization surveillance program. It is not intended to diagnose MRSA infection nor to guide or monitor treatment for MRSA  infections. Performed at [facility], [place] [address], [place], [place] [address]          Radiology Studies: Dg Elbow Complete Left (3+view)  Result Date: 08/31/2017 CLINICAL DATA:  Fall on left side.  Elbow pain EXAM: LEFT ELBOW - COMPLETE 3+ VIEW COMPARISON:  None. FINDINGS: No acute bony abnormality. Specifically, no fracture, subluxation, or dislocation. No joint effusion. Joint spaces are maintained. IMPRESSION: No acute bony abnormality. Electronically Signed   By: Rolm Baptise M.D.   On: 08/31/2017 08:21        Scheduled Meds: . amLODipine  10 mg Oral Daily  . docusate sodium  100 mg Oral BID  . enoxaparin (LOVENOX) injection  40 mg Subcutaneous Q24H  . escitalopram  20 mg Oral Daily  . ezetimibe  10 mg Oral Daily  . hydrOXYzine  25 mg Oral QHS  . levothyroxine  75 mcg Oral QPC breakfast  . metoprolol tartrate  25 mg Oral BID  . traZODone  100 mg Oral QHS  .  warfarin  7.5 mg Oral ONCE-1800  . Warfarin - Pharmacist Dosing Inpatient   Does not apply q1800   Continuous Infusions: . methocarbamol (ROBAXIN)  IV       LOS: 6 days     Bonnell Public, MD. [facility] Pager [phone]  If 7PM-7AM, please contact night-coverage [email or web address] Password [place] 08/31/2017, 4:41 PM

## 2017-08-31 NOTE — Progress Notes (Signed)
Physical Therapy Treatment Patient Details Name: Jennifer House MRN: [id number] DOB: 1945/07/11 Today's Date: 08/31/2017    History of Present Illness Pt is a 72 y/o female who sustained a fall at home and is now s/p L THA direct anterior approach. PMH including but not limited to HTN, HLD and a-fib.    PT Comments    Patient tolerated session well and able to ascend/descend 2 steps simulating home entrance. Pt's daughter present and stabilized RW for pt. Pt continues to fatigue quickly but continues to make progress toward PT goals. Pt and daughter given stair and HEP handouts. Continue to progress as tolerated.    Follow Up Recommendations  Home health PT;Supervision/Assistance - 24 hour     Equipment Recommendations  Rolling walker with 5" wheels;3in1 (PT)    Recommendations for Other Services       Precautions / Restrictions Precautions Precautions: Fall Restrictions Weight Bearing Restrictions: Yes LLE Weight Bearing: Weight bearing as tolerated    Mobility  Bed Mobility Overal bed mobility: Needs Assistance Bed Mobility: Supine to Sit     Supine to sit: Min assist     General bed mobility comments: assist to bring L LE to EOB  Transfers Overall transfer level: Needs assistance Equipment used: Rolling walker (2 wheeled) Transfers: Sit to/from Stand Sit to Stand: Min guard         General transfer comment: cues for safe hand placement; min guard for safety  Ambulation/Gait Ambulation/Gait assistance: Min guard Gait Distance (Feet): (77ft X2) Assistive device: Rolling walker (2 wheeled) Gait Pattern/deviations: Decreased step length - right;Decreased stance time - left;Decreased stride length;Decreased weight shift to left;Trunk flexed;Step-through pattern Gait velocity: decreased   General Gait Details: cues for posture/forward gaze and step length symmetry   Stairs Stairs: Yes Stairs assistance: Min assist Stair Management: No rails;Step to  pattern;Backwards;With walker Number of Stairs: 2 General stair comments: cues for sequencing and technique; daughter stabilized RW   Wheelchair Mobility    Modified Rankin (Stroke Patients Only)       Balance Overall balance assessment: Needs assistance Sitting-balance support: Feet supported Sitting balance-Leahy Scale: Good     Standing balance support: During functional activity;Bilateral upper extremity supported Standing balance-Leahy Scale: Poor                              Cognition Arousal/Alertness: Awake/alert Behavior During Therapy: WFL for tasks assessed/performed Overall Cognitive Status: Within Functional Limits for tasks assessed                                        Exercises      General Comments        Pertinent Vitals/Pain Pain Assessment: Faces Faces Pain Scale: Hurts a little bit Pain Location: L hip Pain Descriptors / Indicators: Sore Pain Intervention(s): Monitored during session;Repositioned    Home Living                      Prior Function            PT Goals (current goals can now be found in the care plan section) Acute Rehab PT Goals PT Goal Formulation: With patient/family Time For Goal Achievement: 09/12/17 Potential to Achieve Goals: Good Progress towards PT goals: Progressing toward goals    Frequency    7X/week  PT Plan Current plan remains appropriate    Co-evaluation              AM-PAC PT "6 Clicks" Daily Activity  Outcome Measure  Difficulty turning over in bed (including adjusting bedclothes, sheets and blankets)?: Unable Difficulty moving from lying on back to sitting on the side of the bed? : Unable Difficulty sitting down on and standing up from a chair with arms (e.g., wheelchair, bedside commode, etc,.)?: Unable Help needed moving to and from a bed to chair (including a wheelchair)?: A Little Help needed walking in hospital room?: A Little Help  needed climbing 3-5 steps with a railing? : A Little 6 Click Score: 12    End of Session Equipment Utilized During Treatment: Gait belt Activity Tolerance: Patient tolerated treatment well Patient left: in chair;with call bell/phone within reach;with family/visitor present Nurse Communication: Mobility status PT Visit Diagnosis: Other abnormalities of gait and mobility (R26.89)     Time: 1340-1405 PT Time Calculation (min) (ACUTE ONLY): 25 min  Charges:  $Gait Training: 8-22 mins $Therapeutic Activity: 8-22 mins                    G Codes:       Earney Navy, PTA Pager: [phone]     Darliss Cheney 08/31/2017, 2:11 PM

## 2017-08-31 NOTE — Progress Notes (Signed)
Physical Therapy Treatment Patient Details Name: Jennifer House MRN: [id number] DOB: 1945/11/22 Today's Date: 08/31/2017    History of Present Illness Pt is a 72 y/o female who sustained a fall at home and is now s/p L THA direct anterior approach. PMH including but not limited to HTN, HLD and a-fib.    PT Comments    Patient is making gradual progress toward PT goals and tolerated ambulating 87ft with RW. Pt overall required min guard assist for OOB mobility and min A for bed mobility. Family member assisted with bed mobility. Continue to progress as tolerated.    Follow Up Recommendations  Home health PT;Supervision/Assistance - 24 hour     Equipment Recommendations  Rolling walker with 5" wheels;3in1 (PT)    Recommendations for Other Services       Precautions / Restrictions Precautions Precautions: Fall Restrictions Weight Bearing Restrictions: Yes LLE Weight Bearing: Weight bearing as tolerated    Mobility  Bed Mobility Overal bed mobility: Needs Assistance Bed Mobility: Supine to Sit     Supine to sit: Min assist     General bed mobility comments: family assisted to bring L LE to EOB; increased time and effort and use of rail  Transfers Overall transfer level: Needs assistance Equipment used: Rolling walker (2 wheeled) Transfers: Sit to/from Stand Sit to Stand: Min guard         General transfer comment: cues for safe hand placement; min guard for safety  Ambulation/Gait Ambulation/Gait assistance: Min guard Gait Distance (Feet): 60 Feet Assistive device: Rolling walker (2 wheeled) Gait Pattern/deviations: Decreased step length - right;Decreased stance time - left;Decreased stride length;Decreased weight shift to left;Trunk flexed;Step-through pattern;Step-to pattern Gait velocity: decreased   General Gait Details: cues for posture/forward gaze, sequencing, and step length symmetry; pt progressing to step through pattern   Stairs              Wheelchair Mobility    Modified Rankin (Stroke Patients Only)       Balance Overall balance assessment: Needs assistance Sitting-balance support: Feet supported Sitting balance-Leahy Scale: Good     Standing balance support: During functional activity;Bilateral upper extremity supported Standing balance-Leahy Scale: Poor                              Cognition Arousal/Alertness: Awake/alert Behavior During Therapy: WFL for tasks assessed/performed Overall Cognitive Status: Within Functional Limits for tasks assessed                                        Exercises      General Comments General comments (skin integrity, edema, etc.): family present; pt SOB with mobiltity; SpO2 95% on RA      Pertinent Vitals/Pain Pain Assessment: Faces Faces Pain Scale: Hurts a little bit Pain Location: L hip Pain Descriptors / Indicators: Sore;Guarding Pain Intervention(s): Limited activity within patient's tolerance;Monitored during session;Repositioned;Premedicated before session    Home Living                      Prior Function            PT Goals (current goals can now be found in the care plan section) Acute Rehab PT Goals PT Goal Formulation: With patient/family Time For Goal Achievement: 09/12/17 Potential to Achieve Goals: Good Progress towards PT goals: Progressing toward goals  Frequency    7X/week      PT Plan Current plan remains appropriate    Co-evaluation              AM-PAC PT "6 Clicks" Daily Activity  Outcome Measure  Difficulty turning over in bed (including adjusting bedclothes, sheets and blankets)?: Unable Difficulty moving from lying on back to sitting on the side of the bed? : Unable Difficulty sitting down on and standing up from a chair with arms (e.g., wheelchair, bedside commode, etc,.)?: Unable Help needed moving to and from a bed to chair (including a wheelchair)?: A Little Help  needed walking in hospital room?: A Little Help needed climbing 3-5 steps with a railing? : A Little 6 Click Score: 12    End of Session Equipment Utilized During Treatment: Gait belt Activity Tolerance: Patient tolerated treatment well Patient left: in chair;with call bell/phone within reach;with family/visitor present Nurse Communication: Mobility status PT Visit Diagnosis: Other abnormalities of gait and mobility (R26.89)     Time: 8416-6063 PT Time Calculation (min) (ACUTE ONLY): 18 min  Charges:  $Gait Training: 8-22 mins                    G Codes:       Earney Navy, PTA Pager: [phone]     Darliss Cheney 08/31/2017, 9:25 AM

## 2017-08-31 NOTE — Progress Notes (Deleted)
CM called and stated to place [place] on patients discharge form.

## 2017-08-31 NOTE — Progress Notes (Addendum)
     Subjective: 3 Days Post-Op Procedure(s) (LRB): TOTAL HIP ARTHROPLASTY ANTERIOR APPROACH (Left)   Patient reports pain as moderate/severe.  Patient and family feel that she isn't doing enough to be able to safely be discharged home at this time. She is also complaining of left elbow pain, the elbow she fell on in her incident.    Objective:   VITALS:   Vitals:   08/30/17 2026 08/31/17 0515  BP: 132/69 128/65  Pulse: 81 72  Resp: 15 14  Temp: 99.1 F (37.3 C) 98.5 F (36.9 C)  SpO2: 93% 96%   Left hip: Dorsiflexion/Plantar flexion intact Incision: dressing C/D/I No cellulitis present Compartment soft  Left elbow: Good ROM with pain Significant tenderness over olecranon process   LABS Recent Labs    08/29/17 0522 08/30/17 0600 08/31/17 0253  HGB 11.6* 10.7* 10.5*  HCT 36.8 34.3* 34.0*  WBC 8.3 7.2 7.2  PLT 168 160 156    Recent Labs    08/29/17 0522 08/30/17 0600  NA 136 137  K 4.4 3.8  BUN 17 18  CREATININE 0.85 0.84  GLUCOSE 160* 127*     Assessment/Plan: 3 Days Post-Op Procedure(s) (LRB): TOTAL HIP ARTHROPLASTY ANTERIOR APPROACH (Left)   Prevena dressing and wound vac were placed.  Up with therapy  Discharge disposition TBD  Patient complaining of significant left elbow pain, x-ray ordered to r/o fx  Results of the elbow x-rays were negative for any acute bony abnormalities     Jennifer House. Jennifer House   PAC  08/31/2017, 8:04 AM

## 2017-08-31 NOTE — Progress Notes (Signed)
Patient has concern that she may have fractured left elbow when she fell at home which resulted in a left hip fracture for this admission. Patient describes left elbow as sharp pain. Left elbow is positive for ecchymosis. Nursing will continue to monitor.

## 2017-09-01 ENCOUNTER — Encounter (HOSPITAL_COMMUNITY): Payer: Self-pay | Admitting: Orthopedic Surgery

## 2017-09-01 LAB — CBC
HCT: 32.8 % — ABNORMAL LOW (ref 36.0–46.0)
Hemoglobin: 10 g/dL — ABNORMAL LOW (ref 12.0–15.0)
MCH: 26.8 pg (ref 26.0–34.0)
MCHC: 30.5 g/dL (ref 30.0–36.0)
MCV: 87.9 fL (ref 78.0–100.0)
PLATELETS: 167 10*3/uL (ref 150–400)
RBC: 3.73 MIL/uL — AB (ref 3.87–5.11)
RDW: 14.2 % (ref 11.5–15.5)
WBC: 6.9 10*3/uL (ref 4.0–10.5)

## 2017-09-01 LAB — PROTIME-INR
INR: 1.64
PROTHROMBIN TIME: 19.3 s — AB (ref 11.4–15.2)

## 2017-09-01 MED ORDER — WARFARIN SODIUM 7.5 MG PO TABS
7.5000 mg | ORAL_TABLET | Freq: Once | ORAL | Status: AC
  Administered 2017-09-01: 7.5 mg via ORAL
  Filled 2017-09-01: qty 1

## 2017-09-01 NOTE — Progress Notes (Signed)
Progress note, and physical stick note message: Patient and family concerned about coumadin level. Will patient start regular regimen after discharge, and at home? Thanks in advance.

## 2017-09-01 NOTE — Progress Notes (Signed)
Assessed prevena wound vac, 0 mL drainage. Ice applied to site for pain and swelling control. Pt resting comfortably in chair.

## 2017-09-01 NOTE — Progress Notes (Signed)
ANTICOAGULATION CONSULT NOTE  Pharmacy Consult:  Coumadin Indication: atrial fibrillation  Patient Measurements: Height: 5\' 5"  (165.1 cm) Weight: 203 lb 14.8 oz (92.5 kg) IBW/kg (Calculated) : 57  Vital Signs: Temp: 97.7 F (36.5 C) (06/17 0549) Temp Source: Oral (06/17 0549) BP: 118/67 (06/17 0549) Pulse Rate: 56 (06/17 0549)  Labs: Recent Labs    08/30/17 0600 08/31/17 0253 09/01/17 0259  HGB 10.7* 10.5* 10.0*  HCT 34.3* 34.0* 32.8*  PLT 160 156 167  LABPROT 15.5* 17.3* 19.3*  INR 1.24 1.43 1.64  CREATININE 0.84  --   --     Estimated Creatinine Clearance: 66 mL/min (by C-G formula based on SCr of 0.84 mg/dL).   Assessment: 72 y.o. female with history of Afib on Coumadin PTA.  Patient presented s/p fall and Coumadin was reversed with Vitamin K on 08/26/17.  She was on IV heparin bridge briefly before left hip arthroplasty on 08/28/17.  Coumadin resumed on 08/28/17 and Lovenox bridge for VTE px started 08/29/17.   INR today remains SUB-therapeutic despite higher dose of 7.5mg  daily in setting of Vitamin K Coumadin resistance. However INR continues to trend up (INR 1.64 today). Mild anemia noted- doing ok, plts wnl. No overt bleeding reported.    Home Coumadin dose: 5mg  daily except 7.5mg  on Sunday  Goal of Therapy:  INR 2 - 3 Monitor platelets by anticoagulation protocol: Yes   Plan:  Coumadin 7.5mg  PO again today Lovenox 40mg  SQ Q24H.  D/C when INR >/= 1.8 per Ortho Daily PT / INR *Suspect INR will jump up soon once Vit K resistance resolves Thank you for allowing pharmacy to be a part of this patient's care.  Sloan Leiter, PharmD, BCPS, BCCCP Clinical Pharmacist Clinical phone 09/01/2017 until 3:[phone] After hours, please call #[phone] 09/01/2017 9:42 AM

## 2017-09-01 NOTE — Progress Notes (Signed)
Physical Therapy Treatment Patient Details Name: Jennifer House MRN: [id number] DOB: 10-01-1945 Today's Date: 09/01/2017    History of Present Illness Pt is a 72 y/o female who sustained a fall at home and is now s/p L THA direct anterior approach. PMH including but not limited to HTN, HLD and a-fib.    PT Comments    Pt in recliner on arrival and eager to mobilize.  She was able to advance distance with x3 standing rest breaks and increased WOB.  SPO2 93% on RA.  Plan for progression of standing exercises with review of stair training this pm.  Pt reports she will be d/c tomorrow.    Follow Up Recommendations  Home health PT;Supervision/Assistance - 24 hour     Equipment Recommendations  Rolling walker with 5" wheels;3in1 (PT)    Recommendations for Other Services       Precautions / Restrictions Precautions Precautions: Fall Precaution Comments: wound VAC L hip Restrictions Weight Bearing Restrictions: Yes LLE Weight Bearing: Weight bearing as tolerated    Mobility  Bed Mobility Overal bed mobility: Needs Assistance Bed Mobility: Sit to Supine       Sit to supine: Min assist   General bed mobility comments: assist to bring L LE to EOB  Transfers Overall transfer level: Needs assistance Equipment used: Rolling walker (2 wheeled) Transfers: Sit to/from Stand Sit to Stand: Min guard Stand pivot transfers: Min assist       General transfer comment: cues for safe hand placement; min guard for safety  Ambulation/Gait Ambulation/Gait assistance: Min guard Gait Distance (Feet): 200 Feet(required x 3 standing rest breaks.  ) Assistive device: Rolling walker (2 wheeled) Gait Pattern/deviations: Decreased step length - right;Decreased stance time - left;Decreased stride length;Decreased weight shift to left;Trunk flexed;Step-through pattern Gait velocity: decreased   General Gait Details: cues for posture/forward gaze and step length symmetry   Stairs             Wheelchair Mobility    Modified Rankin (Stroke Patients Only)       Balance Overall balance assessment: Needs assistance Sitting-balance support: Feet supported Sitting balance-Leahy Scale: Good     Standing balance support: During functional activity;Bilateral upper extremity supported Standing balance-Leahy Scale: Poor                              Cognition Arousal/Alertness: Awake/alert Behavior During Therapy: WFL for tasks assessed/performed Overall Cognitive Status: Within Functional Limits for tasks assessed                                        Exercises Total Joint Exercises Ankle Circles/Pumps: AROM;Both;20 reps;Supine Quad Sets: AROM;Left;10 reps;Supine Short Arc Quad: AROM;Left;10 reps;Supine Heel Slides: Left;10 reps;Supine;AAROM Hip ABduction/ADduction: AAROM;Left;10 reps;Supine    General Comments        Pertinent Vitals/Pain Pain Assessment: 0-10 Pain Score: 4  Pain Location: L hip incision Pain Descriptors / Indicators: Sore Pain Intervention(s): Monitored during session;Repositioned    Home Living                      Prior Function            PT Goals (current goals can now be found in the care plan section) Acute Rehab PT Goals Patient Stated Goal: return home, decrease pain Potential to Achieve Goals: Good Progress  towards PT goals: Progressing toward goals    Frequency    7X/week      PT Plan Current plan remains appropriate    Co-evaluation              AM-PAC PT "6 Clicks" Daily Activity  Outcome Measure  Difficulty turning over in bed (including adjusting bedclothes, sheets and blankets)?: Unable Difficulty moving from lying on back to sitting on the side of the bed? : Unable Difficulty sitting down on and standing up from a chair with arms (e.g., wheelchair, bedside commode, etc,.)?: Unable Help needed moving to and from a bed to chair (including a wheelchair)?: A  Little Help needed walking in hospital room?: A Little Help needed climbing 3-5 steps with a railing? : A Little 6 Click Score: 12    End of Session Equipment Utilized During Treatment: Gait belt Activity Tolerance: Patient tolerated treatment well Patient left: with call bell/phone within reach;with family/visitor present;in bed;with bed alarm set Nurse Communication: Mobility status PT Visit Diagnosis: Other abnormalities of gait and mobility (R26.89)     Time: 6812-7517 PT Time Calculation (min) (ACUTE ONLY): 19 min  Charges:  $Gait Training: 8-22 mins                    G Codes:       Governor Rooks, PTA pager [place] 09/01/2017, 12:19 PM

## 2017-09-01 NOTE — Progress Notes (Signed)
Physical Therapy Treatment Patient Details Name: Jennifer House MRN: [id number] DOB: [birth year] Today's Date: 09/01/2017    History of Present Illness Pt is a 72 y/o female who sustained a fall at home and is now s/p L THA direct anterior approach. PMH including but not limited to HTN, HLD and a-fib.    PT Comments    Pt is progressing well with her gait and mobility. Her daughter was present and impressed with her progress today.  She was able to walk a good distance down the hallway and is min to min guard assist overall.  We will plan to re-review stairs and review standing hip exercises prior to possible d/c in AM.     Follow Up Recommendations  Home health PT;Supervision/Assistance - 24 hour     Equipment Recommendations  Rolling walker with 5" wheels;3in1 (PT)    Recommendations for Other Services   NA     Precautions / Restrictions Precautions Precaution Comments: wound VAC L hip Restrictions LLE Weight Bearing: Weight bearing as tolerated    Mobility  Bed Mobility Overal bed mobility: Needs Assistance Bed Mobility: Supine to Sit     Supine to sit: Min assist     General bed mobility comments: Min assist to help pt progress her left leg over the EOB.   Transfers Overall transfer level: Needs assistance Equipment used: Rolling walker (2 wheeled) Transfers: Sit to/from Stand Sit to Stand: Min guard         General transfer comment: Min guard assist for safety, verbal cues for safe hand placement.   Ambulation/Gait Ambulation/Gait assistance: Min guard Gait Distance (Feet): 130 Feet Assistive device: Rolling walker (2 wheeled) Gait Pattern/deviations: Decreased step length - right;Decreased stance time - left;Decreased stride length;Decreased weight shift to left;Trunk flexed;Step-through pattern     General Gait Details: Verbal cues for upright posture, safe RW use, min guard assist for safety.           Balance Overall balance assessment: Needs  assistance Sitting-balance support: Feet supported;No upper extremity supported;Bilateral upper extremity supported Sitting balance-Leahy Scale: Good     Standing balance support: Bilateral upper extremity supported Standing balance-Leahy Scale: Poor Standing balance comment: needs external support/assist.                             Cognition Arousal/Alertness: Awake/alert Behavior During Therapy: WFL for tasks assessed/performed Overall Cognitive Status: Within Functional Limits for tasks assessed                                        Exercises Total Joint Exercises Ankle Circles/Pumps: AROM;Both;20 reps Quad Sets: AROM;Left;10 reps Short Arc Quad: AROM;Left;10 reps Heel Slides: AAROM;Left;10 reps Hip ABduction/ADduction: AAROM;Left;10 reps Long Arc Quad: AROM;Left;10 reps        Pertinent Vitals/Pain Pain Assessment: Faces Faces Pain Scale: Hurts little more Pain Location: L hip incision Pain Descriptors / Indicators: Sore Pain Intervention(s): Limited activity within patient's tolerance;Monitored during session;Repositioned           PT Goals (current goals can now be found in the care plan section) Acute Rehab PT Goals Patient Stated Goal: return home, decrease pain Progress towards PT goals: Progressing toward goals    Frequency    Min 5X/week      PT Plan Frequency needs to be updated       AM-PAC  PT "6 Clicks" Daily Activity  Outcome Measure  Difficulty turning over in bed (including adjusting bedclothes, sheets and blankets)?: Unable Difficulty moving from lying on back to sitting on the side of the bed? : Unable Difficulty sitting down on and standing up from a chair with arms (e.g., wheelchair, bedside commode, etc,.)?: Unable Help needed moving to and from a bed to chair (including a wheelchair)?: A Little Help needed walking in hospital room?: A Little Help needed climbing 3-5 steps with a railing? : A Little 6  Click Score: 12    End of Session   Activity Tolerance: Patient limited by fatigue;Patient limited by pain Patient left: in chair;with call bell/phone within reach;with family/visitor present   PT Visit Diagnosis: Muscle weakness (generalized) (M62.81);Difficulty in walking, not elsewhere classified (R26.2);Pain Pain - Right/Left: Left Pain - part of body: Leg     Time: 1700-1723 PT Time Calculation (min) (ACUTE ONLY): 23 min  Charges:  $Gait Training: 8-22 mins $Therapeutic Exercise: 8-22 mins          Charletha Dalpe B. Vaiden Adames, [place], DPT [phone]            09/01/2017, 5:29 PM

## 2017-09-01 NOTE — Progress Notes (Signed)
PROGRESS NOTE                                                                                                                                                                                                             Patient Demographics:    Jennifer House, is a 72 y.o. female, DOB - 1945/05/29, XAJ:[id number]  Admit date - 08/25/2017   Admitting Physician Karmen Bongo, MD  Outpatient Primary MD for the patient is Ma Hillock, DO  LOS - 7  Outpatient Specialists: Dr. Rayann Heman (EP)  Chief Complaint  Patient presents with  . Fall       Brief Narrative  72 year old obese female with history of permanent A. fib on Coumadin, hypertension, hypothyroidism, anxiety/depression, GERD, presented with mechanical fall and sustained left femoral neck fracture. Patient anticoagulation was reversed with vitamin K and underwent left total hip arthroplasty on 6/13.   Subjective:    some pain in her left hip that is tolerated with current pain meds.   Assessment  & Plan :    Principal Problem:   Closed displaced fracture of left femoral neck (HCC) Status post left total hip arthroplasty, anterior approach.  Improving.  PT recommends home health aide pain control with Vicodin as needed and Robaxin for muscle spasms.  Colace for bowel regimen PDX following  Active Problems:   Hypothyroidism Continue Synthroid.   Chronic atrial fibrillation (HCC) INR reversed with vitamin K preop.  Now Coumadin resumed and INR slowly improving (1.64 today).  Bridging with Lovenox 40 mg daily with plan on discharge once INR >1.8 (hopefully should improve tomorrow after vitamin K out of the system).  Rate controlled on metoprolol. Follow with Dr. Rayann Heman as outpatient.  Essential hypertension Continue amlodipine and metoprolol.    Long term (current) use of anticoagulants   Hyperlipidemia LDL goal <100 .       Code Status : Full  code  Family Communication  : Husband at bedside  Disposition Plan  : Home with home health tomorrow if INR>1.8  Barriers For Discharge : Active symptoms  Consults  : Orthopedics  Procedures  : Left total hip arthroplasty  DVT Prophylaxis  : Coumadin and prophylactic Lovenox  Lab Results  Component Value Date   PLT 167 09/01/2017    Antibiotics  :   Anti-infectives (From admission, onward)  Start     Dose/Rate Route Frequency Ordered Stop   08/28/17 2200  ceFAZolin (ANCEF) IVPB 2g/100 mL premix     2 g 200 mL/hr over 30 Minutes Intravenous Every 6 hours 08/28/17 2102 08/29/17 0552   08/28/17 0730  ceFAZolin (ANCEF) IVPB 2g/100 mL premix     2 g 200 mL/hr over 30 Minutes Intravenous To ShortStay Surgical 08/28/17 0723 08/28/17 1635        Objective:   Vitals:   08/31/17 1505 08/31/17 2111 09/01/17 0549 09/01/17 1124  BP: (!) 118/59 134/73 118/67   Pulse: 63 91 (!) 56 70  Resp: 20 18 17    Temp:  98.1 F (36.7 C) 97.7 F (36.5 C)   TempSrc:  Oral Oral   SpO2: 96% 96% 96%   Weight:      Height:        Wt Readings from Last 3 Encounters:  08/28/17 92.5 kg (203 lb 14.8 oz)  08/18/17 92.5 kg (204 lb)  06/02/17 93 kg (205 lb)     Intake/Output Summary (Last 24 hours) at 09/01/2017 1205 Last data filed at 09/01/2017 0900 Gross per 24 hour  Intake 360 ml  Output -  Net 360 ml     Physical Exam  Gen: not in distress HEENT: no pallor, moist mucosa, supple neck Chest: clear b/l, no added sounds CVS: N S1&S2, no murmurs,  GI: soft, NT, ND, Musculoskeletal: warm, dressing over left hip     Data Review:    CBC Recent Labs  Lab 08/28/17 0118 08/29/17 0522 08/30/17 0600 08/31/17 0253 09/01/17 0259  WBC 6.8 8.3 7.2 7.2 6.9  HGB 12.9 11.6* 10.7* 10.5* 10.0*  HCT 40.9 36.8 34.3* 34.0* 32.8*  PLT 159 168 160 156 167  MCV 86.1 85.2 86.2 87.4 87.9  MCH 27.2 26.9 26.9 27.0 26.8  MCHC 31.5 31.5 31.2 30.9 30.5  RDW 14.3 13.7 14.0 14.1 14.2     Chemistries  Recent Labs  Lab 08/27/17 0724 08/29/17 0522 08/30/17 0600  NA 140 136 137  K 3.9 4.4 3.8  CL 106 105 104  CO2 26 27 27   GLUCOSE 96 160* 127*  BUN 13 17 18   CREATININE 0.81 0.85 0.84  CALCIUM 9.0 8.5* 8.6*   ------------------------------------------------------------------------------------------------------------------ No results for input(s): CHOL, HDL, LDLCALC, TRIG, CHOLHDL, LDLDIRECT in the last 72 hours.  Lab Results  Component Value Date   HGBA1C 5.4 09/19/2016   ------------------------------------------------------------------------------------------------------------------ No results for input(s): TSH, T4TOTAL, T3FREE, THYROIDAB in the last 72 hours.  Invalid input(s): FREET3 ------------------------------------------------------------------------------------------------------------------ No results for input(s): VITAMINB12, FOLATE, FERRITIN, TIBC, IRON, RETICCTPCT in the last 72 hours.  Coagulation profile Recent Labs  Lab 08/28/17 0118 08/29/17 0524 08/30/17 0600 08/31/17 0253 09/01/17 0259  INR 1.21 1.09 1.24 1.43 1.64    No results for input(s): DDIMER in the last 72 hours.  Cardiac Enzymes No results for input(s): CKMB, TROPONINI, MYOGLOBIN in the last 168 hours.  Invalid input(s): CK ------------------------------------------------------------------------------------------------------------------ No results found for: BNP  Inpatient Medications  Scheduled Meds: . amLODipine  10 mg Oral Daily  . docusate sodium  100 mg Oral BID  . enoxaparin (LOVENOX) injection  40 mg Subcutaneous Q24H  . escitalopram  20 mg Oral Daily  . ezetimibe  10 mg Oral Daily  . hydrOXYzine  25 mg Oral QHS  . levothyroxine  75 mcg Oral QPC breakfast  . metoprolol tartrate  25 mg Oral BID  . traZODone  100 mg Oral QHS  . warfarin  7.5  mg Oral ONCE-1800  . Warfarin - Pharmacist Dosing Inpatient   Does not apply q1800   Continuous Infusions: .  methocarbamol (ROBAXIN)  IV     PRN Meds:.bisacodyl, HYDROcodone-acetaminophen, menthol-cetylpyridinium **OR** phenol, methocarbamol **OR** methocarbamol (ROBAXIN)  IV, metoCLOPramide **OR** metoCLOPramide (REGLAN) injection, morphine injection, ondansetron **OR** ondansetron (ZOFRAN) IV, polyethylene glycol, sodium phosphate  Micro Results Recent Results (from the past 240 hour(s))  MRSA PCR Screening     Status: None   Collection Time: 08/26/17 10:21 PM  Result Value Ref Range Status   MRSA by PCR NEGATIVE NEGATIVE Final    Comment:        The GeneXpert MRSA Assay (FDA approved for NASAL specimens only), is one component of a comprehensive MRSA colonization surveillance program. It is not intended to diagnose MRSA infection nor to guide or monitor treatment for MRSA infections. Performed at [facility], [place] [address], [place], [place] [address]     Radiology Reports Dg Elbow Complete Left (3+view)  Result Date: 08/31/2017 CLINICAL DATA:  Fall on left side.  Elbow pain EXAM: LEFT ELBOW - COMPLETE 3+ VIEW COMPARISON:  None. FINDINGS: No acute bony abnormality. Specifically, no fracture, subluxation, or dislocation. No joint effusion. Joint spaces are maintained. IMPRESSION: No acute bony abnormality. Electronically Signed   By: Rolm Baptise M.D.   On: 08/31/2017 08:21   Ct Head Wo Contrast  Result Date: 08/25/2017 CLINICAL DATA:  [place] out of bed today striking the left side of the face and head. Some headache. EXAM: CT HEAD WITHOUT CONTRAST TECHNIQUE: Contiguous axial images were obtained from the base of the skull through the vertex without intravenous contrast. COMPARISON:  None. FINDINGS: Brain: Brain atrophy. Extensive chronic small-vessel ischemic changes affecting the cerebral hemispheric deep and subcortical white matter. Small-vessel changes also of the brainstem, basal ganglia and thalami. No sign of acute infarction, mass lesion, hemorrhage, hydrocephalus or  extra-axial collection. Vascular: There is atherosclerotic calcification of the major vessels at the base of the brain. Skull: No fracture. Sinuses/Orbits: Clear/normal Other: None IMPRESSION: No acute or traumatic finding. Brain atrophy with extensive chronic small-vessel ischemic changes throughout. Electronically Signed   By: Nelson Chimes M.D.   On: 08/25/2017 09:28   Ct Chest Wo Contrast  Result Date: 08/26/2017 CLINICAL DATA:  Inpatient. New small focal opacity in left lung on chest radiograph from 1 day prior. EXAM: CT CHEST WITHOUT CONTRAST TECHNIQUE: Multidetector CT imaging of the chest was performed following the standard protocol without IV contrast. COMPARISON:  Chest radiograph from one day prior. FINDINGS: Cardiovascular: Top-normal heart size. No significant pericardial effusion/thickening. Left anterior descending and left circumflex coronary atherosclerosis. Atherosclerotic nonaneurysmal thoracic aorta. Normal caliber pulmonary arteries. Mediastinum/Nodes: No discrete thyroid nodules. Unremarkable esophagus. No pathologically enlarged axillary, mediastinal or hilar lymph nodes, noting limited sensitivity for the detection of hilar adenopathy on this noncontrast study. Lungs/Pleura: No pneumothorax. No pleural effusion. No acute consolidative airspace disease, lung masses or significant pulmonary nodules. There are several scattered small parenchymal bands in the mid to lower lungs bilaterally. Upper abdomen: Small hiatal hernia.  Diffuse hepatic steatosis. Musculoskeletal: No aggressive appearing focal osseous lesions. Mild thoracic spondylosis. IMPRESSION: 1. No lung masses or significant pulmonary nodules. 2. Scattered small parenchymal bands in the mid to lower lungs bilaterally, compatible with mild scarring or atelectasis, which accounts for the left lower lung opacity described on the chest radiograph from 1 day prior. 3. Two-vessel coronary atherosclerosis. 4. Small hiatal hernia. 5.  Diffuse hepatic steatosis. Aortic Atherosclerosis (ICD10-I70.0).  Electronically Signed   By: Ilona Sorrel M.D.   On: 08/26/2017 12:13   Pelvis Portable  Result Date: 08/28/2017 CLINICAL DATA:  Post left total hip replacement. EXAM: PORTABLE PELVIS 1-2 VIEWS COMPARISON:  08/25/2017 and earlier same day. FINDINGS: Examination demonstrates patient's recent left total hip arthroplasty with prosthetic components normally located and intact. Mild degenerate change of the right hip and spine. IMPRESSION: Expected changes post left total hip arthroplasty. Electronically Signed   By: Marin Olp M.D.   On: 08/28/2017 19:44   Dg Chest Port 1 View  Result Date: 08/25/2017 CLINICAL DATA:  Preop cardiovascular exam [phone], HTN, former smoker - quit 30 years ago EXAM: PORTABLE CHEST 1 VIEW COMPARISON:  Chest x-ray dated 08/29/2014. FINDINGS: Cardiomegaly. Aortic atherosclerosis. New small opacity within the LEFT lower lung. Lungs otherwise clear. No pleural effusion or pneumothorax seen. No acute or suspicious osseous finding. IMPRESSION: 1. New small ill-defined opacity within the LEFT lower lung. This is most likely focal scarring or atelectasis but chest CT is recommended to ensure benignity given the smoking history. 2. Cardiomegaly. 3. Aortic atherosclerosis. Electronically Signed   By: Franki Cabot M.D.   On: 08/25/2017 11:01   Dg Knee Left Port  Result Date: 08/25/2017 CLINICAL DATA:  72 year old female with a history of fall and left knee pain EXAM: PORTABLE LEFT KNEE - 1-2 VIEW COMPARISON:  None. FINDINGS: No acute displaced fracture. Medial greater than lateral joint space narrowing. Marginal osteophyte formation. No joint effusion. Degenerative changes of the patellofemoral joint. IMPRESSION: No acute bony abnormality identified. Tricompartmental osteoarthritis Electronically Signed   By: Corrie Mckusick D.O.   On: 08/25/2017 13:40   Dg C-arm 1-60 Min  Result Date: 08/28/2017 CLINICAL DATA:  Hip  arthroplasty EXAM: OPERATIVE left HIP (WITH PELVIS IF PERFORMED) 2 VIEWS TECHNIQUE: Fluoroscopic spot image(s) were submitted for interpretation post-operatively. COMPARISON:  08/25/2017 FINDINGS: Total fluoroscopy time was 17 seconds. Two low resolution intraoperative spot views of the left hip. The images demonstrate a left hip replacement with normal alignment. IMPRESSION: Intraoperative fluoroscopic assistance provided during left hip replacement Electronically Signed   By: Donavan Foil M.D.   On: 08/28/2017 19:35   Dg Hip Operative Unilat W Or W/o Pelvis Left  Result Date: 08/28/2017 CLINICAL DATA:  Hip arthroplasty EXAM: OPERATIVE left HIP (WITH PELVIS IF PERFORMED) 2 VIEWS TECHNIQUE: Fluoroscopic spot image(s) were submitted for interpretation post-operatively. COMPARISON:  08/25/2017 FINDINGS: Total fluoroscopy time was 17 seconds. Two low resolution intraoperative spot views of the left hip. The images demonstrate a left hip replacement with normal alignment. IMPRESSION: Intraoperative fluoroscopic assistance provided during left hip replacement Electronically Signed   By: Donavan Foil M.D.   On: 08/28/2017 19:35   Dg Hip Unilat W Or Wo Pelvis 2-3 Views Left  Result Date: 08/25/2017 CLINICAL DATA:  Left hip pain since the patient fell out of bed this morning. Initial encounter. EXAM: DG HIP (WITH OR WITHOUT PELVIS) 2-3V LEFT COMPARISON:  None. FINDINGS: The patient has an acute fracture through the base of the left femoral neck. The femoral head is located. No other abnormality is identified. IMPRESSION: Acute basicervical fracture left hip. Electronically Signed   By: Inge Rise M.D.   On: 08/25/2017 09:10    Time Spent in minutes  25   Samentha Perham M.D on 09/01/2017 at 12:05 PM  Between 7am to 7pm - Pager - [phone]  After 7pm go to [email or web address] - password [facility]  [facility] -  Office  [phone]

## 2017-09-02 DIAGNOSIS — I482 Chronic atrial fibrillation: Secondary | ICD-10-CM

## 2017-09-02 DIAGNOSIS — S72002A Fracture of unspecified part of neck of left femur, initial encounter for closed fracture: Secondary | ICD-10-CM

## 2017-09-02 DIAGNOSIS — I10 Essential (primary) hypertension: Secondary | ICD-10-CM

## 2017-09-02 LAB — CBC
HEMATOCRIT: 36.6 % (ref 36.0–46.0)
Hemoglobin: 11.2 g/dL — ABNORMAL LOW (ref 12.0–15.0)
MCH: 26.8 pg (ref 26.0–34.0)
MCHC: 30.6 g/dL (ref 30.0–36.0)
MCV: 87.6 fL (ref 78.0–100.0)
Platelets: 213 10*3/uL (ref 150–400)
RBC: 4.18 MIL/uL (ref 3.87–5.11)
RDW: 14.3 % (ref 11.5–15.5)
WBC: 5.7 10*3/uL (ref 4.0–10.5)

## 2017-09-02 LAB — PROTIME-INR
INR: 1.78
Prothrombin Time: 20.6 seconds — ABNORMAL HIGH (ref 11.4–15.2)

## 2017-09-02 MED ORDER — POLYETHYLENE GLYCOL 3350 17 G PO PACK: 17.0000 g | PACK | Freq: Every day | ORAL | 0 refills | Status: DC | PRN

## 2017-09-02 MED ORDER — SENNOSIDES-DOCUSATE SODIUM 8.6-50 MG PO TABS: 2.0000 | ORAL_TABLET | Freq: Every day | ORAL | 0 refills | Status: DC

## 2017-09-02 MED ORDER — METHOCARBAMOL 500 MG PO TABS: 500.0000 mg | ORAL_TABLET | Freq: Four times a day (QID) | ORAL | 0 refills | Status: DC | PRN

## 2017-09-02 MED ORDER — WARFARIN SODIUM 5 MG PO TABS: 10.0000 mg | ORAL_TABLET | Freq: Once | ORAL | Status: DC

## 2017-09-02 NOTE — Discharge Summary (Signed)
Physician Discharge Summary   Patient ID: Jennifer House MRN: [id number] DOB/AGE: Dec 20, 1945 72 y.o.  Admit date: 08/25/2017 Discharge date: 09/02/2017  Primary Care Physician:  Ma Hillock, DO   Recommendations for Outpatient Follow-up:  1. Follow up with PCP in 1-2 weeks 2. Please follow up on PT/INR  Home Health: Home health PT, DME 3 in 1 Equipment/Devices:   Discharge Condition: stable  CODE STATUS: FULL  Diet recommendation: Heart healthy diet   Discharge Diagnoses:    . Chronic atrial fibrillation ([place]) . Essential hypertension, benign . Hyperlipidemia LDL goal <100 . Hypothyroidism . Closed displaced fracture of left femoral neck ([facility])   Consults: Orthopedics   Allergies:   Allergies  Allergen Reactions  . Ibandronate Sodium Other (See Comments)    Bones hurt  . Pravachol [Pravastatin Sodium] Other (See Comments)    Myalgias   . Ultram [Tramadol Hcl] Nausea Only     DISCHARGE MEDICATIONS: Allergies as of 09/02/2017      Reactions   Ibandronate Sodium Other (See Comments)   Bones hurt   Pravachol [pravastatin Sodium] Other (See Comments)   Myalgias   Ultram [tramadol Hcl] Nausea Only      Medication List    TAKE these medications   amLODipine 10 MG tablet Commonly known as:  NORVASC Take 1 tablet (10 mg total) by mouth daily.   benazepril 20 MG tablet Commonly known as:  LOTENSIN Take 1 tablet (20 mg total) by mouth daily. Needs office visit prior to anymore refills.   CALTRATE 600 1500 (600 Ca) MG Tabs tablet Generic drug:  calcium carbonate Take 3 tablets by mouth daily.   CENTRUM SILVER tablet Take 1 tablet by mouth daily.   cholecalciferol 1000 units tablet Commonly known as:  VITAMIN D Take 1,000 Units by mouth daily.   escitalopram 20 MG tablet Commonly known as:  LEXAPRO Take 1 tablet (20 mg total) by mouth daily.   ezetimibe 10 MG tablet Commonly known as:  ZETIA Take 1 tablet (10 mg total) by mouth daily.    furosemide 20 MG tablet Commonly known as:  LASIX TAKE 1 TABLET BY MOUTH EVERY MONDAY, WEDNESDAY AND FRIDAY   HYDROcodone-acetaminophen 5-325 MG tablet Commonly known as:  NORCO/VICODIN Take 1-2 tablets by mouth every 6 (six) hours as needed for moderate pain.   hydrOXYzine 25 MG capsule Commonly known as:  VISTARIL Take 1 capsule (25 mg total) by mouth at bedtime.   levothyroxine 75 MCG tablet Commonly known as:  SYNTHROID, LEVOTHROID Take 1 tablet (75 mcg total) by mouth daily.   methocarbamol 500 MG tablet Commonly known as:  ROBAXIN Take 1 tablet (500 mg total) by mouth every 6 (six) hours as needed for muscle spasms.   metoprolol tartrate 50 MG tablet Commonly known as:  LOPRESSOR Take 0.5 tablets (25 mg total) by mouth 2 (two) times daily.   nitroGLYCERIN 0.4 MG SL tablet Commonly known as:  NITROSTAT PLACE 1 TABLET UNDER THE TONGUE EVERY 5 MINUTES AS NEEDED FOR CHEST PAIN,(MAX 3 TABLETS)   polyethylene glycol packet Commonly known as:  MIRALAX / GLYCOLAX Take 17 g by mouth daily as needed for mild constipation or moderate constipation.   senna-docusate 8.6-50 MG tablet Commonly known as:  SENOKOT S Take 2 tablets by mouth at bedtime.   traZODone 50 MG tablet Commonly known as:  DESYREL TAKE 1 TO 2 TABLETS(50 TO 100 MG) BY MOUTH AT BEDTIME AS NEEDED FOR SLEEP What changed:    how much to  take  how to take this  when to take this  additional instructions   warfarin 5 MG tablet Commonly known as:  COUMADIN Take as directed. If you are unsure how to take this medication, talk to your nurse or doctor. Original instructions:  TAKE AS DIRECTED BY COUMADIN CLINIC What changed:  See the new instructions.            Durable Medical Equipment  (From admission, onward)        Start     Ordered   08/30/17 1316  For home use only DME 3 n 1  Once     08/30/17 1321   08/30/17 1316  For home use only DME Walker rolling  Once    Question:  Patient needs a  walker to treat with the following condition  Answer:  S/P total hip arthroplasty   08/30/17 1321       Brief H and P: For complete details please refer to admission H and P, but in brief 72 year old obese female with history of permanent A. fib on Coumadin, hypertension, hypothyroidism, anxiety/depression, GERD, presented with mechanical fall and sustained left femoral neck fracture. Patient anticoagulation was reversed with vitamin K and underwent left total hip arthroplasty on 6/13.  Hospital Course:  Closed displaced fracture of the left femoral neck Orthopedics was consulted, patient underwent left total hip arthroplasty on 6/13 PT recommended home health, has strong family support. Continue pain control, Robaxin as needed for muscle spasms, bowel regimen  Hypothyroidism Continue Synthroid  Chronic atrial fibrillation -INR was reversed for the surgery with vitamin K.  Coumadin has been resumed and INR has been slowly trending up. -At the time of discharge, 1.78. Patient will take Coumadin 10 mg today and continue with her usual outpatient dose from tomorrow   Hyperlipidemia Continue Zetia  Essential hypertension Continue Norvasc  Day of Discharge S: Doing well, eager to go home, no acute issues  BP (!) 144/63 (BP Location: Left Arm)   Pulse 69   Temp (!) 97.5 F (36.4 C) (Oral)   Resp 16   Ht 5\' 5"  (1.651 m)   Wt 92.5 kg (203 lb 14.8 oz)   SpO2 99%   BMI 33.93 kg/m   Physical Exam: General: Alert and awake oriented x3 not in any acute distress. HEENT: anicteric sclera, pupils reactive to light and accommodation CVS: S1-S2 clear no murmur rubs or gallops Chest: clear to auscultation bilaterally, no wheezing rales or rhonchi Abdomen: soft nontender, nondistended, normal bowel sounds Extremities: no cyanosis, clubbing or edema noted bilaterally    The results of significant diagnostics from this hospitalization (including imaging, microbiology, ancillary and  laboratory) are listed below for reference.      Procedures/Studies:  Dg Elbow Complete Left (3+view)  Result Date: 08/31/2017 CLINICAL DATA:  Fall on left side.  Elbow pain EXAM: LEFT ELBOW - COMPLETE 3+ VIEW COMPARISON:  None. FINDINGS: No acute bony abnormality. Specifically, no fracture, subluxation, or dislocation. No joint effusion. Joint spaces are maintained. IMPRESSION: No acute bony abnormality. Electronically Signed   By: Rolm Baptise M.D.   On: 08/31/2017 08:21   Ct Head Wo Contrast  Result Date: 08/25/2017 CLINICAL DATA:  [place] out of bed today striking the left side of the face and head. Some headache. EXAM: CT HEAD WITHOUT CONTRAST TECHNIQUE: Contiguous axial images were obtained from the base of the skull through the vertex without intravenous contrast. COMPARISON:  None. FINDINGS: Brain: Brain atrophy. Extensive chronic small-vessel ischemic changes affecting  the cerebral hemispheric deep and subcortical white matter. Small-vessel changes also of the brainstem, basal ganglia and thalami. No sign of acute infarction, mass lesion, hemorrhage, hydrocephalus or extra-axial collection. Vascular: There is atherosclerotic calcification of the major vessels at the base of the brain. Skull: No fracture. Sinuses/Orbits: Clear/normal Other: None IMPRESSION: No acute or traumatic finding. Brain atrophy with extensive chronic small-vessel ischemic changes throughout. Electronically Signed   By: Nelson Chimes M.D.   On: 08/25/2017 09:28   Ct Chest Wo Contrast  Result Date: 08/26/2017 CLINICAL DATA:  Inpatient. New small focal opacity in left lung on chest radiograph from 1 day prior. EXAM: CT CHEST WITHOUT CONTRAST TECHNIQUE: Multidetector CT imaging of the chest was performed following the standard protocol without IV contrast. COMPARISON:  Chest radiograph from one day prior. FINDINGS: Cardiovascular: Top-normal heart size. No significant pericardial effusion/thickening. Left anterior descending  and left circumflex coronary atherosclerosis. Atherosclerotic nonaneurysmal thoracic aorta. Normal caliber pulmonary arteries. Mediastinum/Nodes: No discrete thyroid nodules. Unremarkable esophagus. No pathologically enlarged axillary, mediastinal or hilar lymph nodes, noting limited sensitivity for the detection of hilar adenopathy on this noncontrast study. Lungs/Pleura: No pneumothorax. No pleural effusion. No acute consolidative airspace disease, lung masses or significant pulmonary nodules. There are several scattered small parenchymal bands in the mid to lower lungs bilaterally. Upper abdomen: Small hiatal hernia.  Diffuse hepatic steatosis. Musculoskeletal: No aggressive appearing focal osseous lesions. Mild thoracic spondylosis. IMPRESSION: 1. No lung masses or significant pulmonary nodules. 2. Scattered small parenchymal bands in the mid to lower lungs bilaterally, compatible with mild scarring or atelectasis, which accounts for the left lower lung opacity described on the chest radiograph from 1 day prior. 3. Two-vessel coronary atherosclerosis. 4. Small hiatal hernia. 5. Diffuse hepatic steatosis. Aortic Atherosclerosis (ICD10-I70.0). Electronically Signed   By: Ilona Sorrel M.D.   On: 08/26/2017 12:13   Pelvis Portable  Result Date: 08/28/2017 CLINICAL DATA:  Post left total hip replacement. EXAM: PORTABLE PELVIS 1-2 VIEWS COMPARISON:  08/25/2017 and earlier same day. FINDINGS: Examination demonstrates patient's recent left total hip arthroplasty with prosthetic components normally located and intact. Mild degenerate change of the right hip and spine. IMPRESSION: Expected changes post left total hip arthroplasty. Electronically Signed   By: Marin Olp M.D.   On: 08/28/2017 19:44   Dg Chest Port 1 View  Result Date: 08/25/2017 CLINICAL DATA:  Preop cardiovascular exam [phone], HTN, former smoker - quit 30 years ago EXAM: PORTABLE CHEST 1 VIEW COMPARISON:  Chest x-ray dated 08/29/2014. FINDINGS:  Cardiomegaly. Aortic atherosclerosis. New small opacity within the LEFT lower lung. Lungs otherwise clear. No pleural effusion or pneumothorax seen. No acute or suspicious osseous finding. IMPRESSION: 1. New small ill-defined opacity within the LEFT lower lung. This is most likely focal scarring or atelectasis but chest CT is recommended to ensure benignity given the smoking history. 2. Cardiomegaly. 3. Aortic atherosclerosis. Electronically Signed   By: Franki Cabot M.D.   On: 08/25/2017 11:01   Dg Knee Left Port  Result Date: 08/25/2017 CLINICAL DATA:  72 year old female with a history of fall and left knee pain EXAM: PORTABLE LEFT KNEE - 1-2 VIEW COMPARISON:  None. FINDINGS: No acute displaced fracture. Medial greater than lateral joint space narrowing. Marginal osteophyte formation. No joint effusion. Degenerative changes of the patellofemoral joint. IMPRESSION: No acute bony abnormality identified. Tricompartmental osteoarthritis Electronically Signed   By: Corrie Mckusick D.O.   On: 08/25/2017 13:40   Dg C-arm 1-60 Min  Result Date: 08/28/2017 CLINICAL DATA:  Hip  arthroplasty EXAM: OPERATIVE left HIP (WITH PELVIS IF PERFORMED) 2 VIEWS TECHNIQUE: Fluoroscopic spot image(s) were submitted for interpretation post-operatively. COMPARISON:  08/25/2017 FINDINGS: Total fluoroscopy time was 17 seconds. Two low resolution intraoperative spot views of the left hip. The images demonstrate a left hip replacement with normal alignment. IMPRESSION: Intraoperative fluoroscopic assistance provided during left hip replacement Electronically Signed   By: Donavan Foil M.D.   On: 08/28/2017 19:35   Dg Hip Operative Unilat W Or W/o Pelvis Left  Result Date: 08/28/2017 CLINICAL DATA:  Hip arthroplasty EXAM: OPERATIVE left HIP (WITH PELVIS IF PERFORMED) 2 VIEWS TECHNIQUE: Fluoroscopic spot image(s) were submitted for interpretation post-operatively. COMPARISON:  08/25/2017 FINDINGS: Total fluoroscopy time was 17 seconds.  Two low resolution intraoperative spot views of the left hip. The images demonstrate a left hip replacement with normal alignment. IMPRESSION: Intraoperative fluoroscopic assistance provided during left hip replacement Electronically Signed   By: Donavan Foil M.D.   On: 08/28/2017 19:35   Dg Hip Unilat W Or Wo Pelvis 2-3 Views Left  Result Date: 08/25/2017 CLINICAL DATA:  Left hip pain since the patient fell out of bed this morning. Initial encounter. EXAM: DG HIP (WITH OR WITHOUT PELVIS) 2-3V LEFT COMPARISON:  None. FINDINGS: The patient has an acute fracture through the base of the left femoral neck. The femoral head is located. No other abnormality is identified. IMPRESSION: Acute basicervical fracture left hip. Electronically Signed   By: Inge Rise M.D.   On: 08/25/2017 09:10      LAB RESULTS: Basic Metabolic Panel: Recent Labs  Lab 08/29/17 0522 08/30/17 0600  NA 136 137  K 4.4 3.8  CL 105 104  CO2 27 27  GLUCOSE 160* 127*  BUN 17 18  CREATININE 0.85 0.84  CALCIUM 8.5* 8.6*   Liver Function Tests: No results for input(s): AST, ALT, ALKPHOS, BILITOT, PROT, ALBUMIN in the last 168 hours. No results for input(s): LIPASE, AMYLASE in the last 168 hours. No results for input(s): AMMONIA in the last 168 hours. CBC: Recent Labs  Lab 09/01/17 0259 09/02/17 0539  WBC 6.9 5.7  HGB 10.0* 11.2*  HCT 32.8* 36.6  MCV 87.9 87.6  PLT 167 213   Cardiac Enzymes: No results for input(s): CKTOTAL, CKMB, CKMBINDEX, TROPONINI in the last 168 hours. BNP: Invalid input(s): POCBNP CBG: No results for input(s): GLUCAP in the last 168 hours.    Disposition and Follow-up: Discharge Instructions    Diet - low sodium heart healthy   Complete by:  As directed    Discharge instructions   Complete by:  As directed    Please take 10mg  (2 tabs of 5mg ) of coumadin tonight and then continue your usual dose from tomorrow   Increase activity slowly   Complete by:  As directed         DISPOSITION: Home with home health PT   [place]    [place], Aaron Edelman, MD. Schedule an appointment as soon as possible for a visit on 09/05/2017.   Specialty:  Orthopedic Surgery Contact information: [address] [place] [address] [phone]        Home, [facility] Follow up.   Specialty:  [facility] Why:  A representative from [facility] will contact you to arrange start date and time for your therapy. Contact information: [address] [place] [address] [phone]        Howard Pouch A, DO. Schedule an appointment as soon as  possible for a visit in 2 week(s).   Specialty:  Family Medicine Contact information: [address] [place] [address] [phone]            Time coordinating discharge:  35 minutes  Signed:   Estill Cotta M.D. [facility] 09/02/2017, 11:46 AM Pager: [phone]

## 2017-09-02 NOTE — Progress Notes (Signed)
ANTICOAGULATION CONSULT NOTE  Pharmacy Consult:  Coumadin Indication: atrial fibrillation  Patient Measurements: Height: 5\' 5"  (165.1 cm) Weight: 203 lb 14.8 oz (92.5 kg) IBW/kg (Calculated) : 57  Vital Signs: Temp: 97.5 F (36.4 C) (06/18 0535) Temp Source: Oral (06/18 0535) BP: 144/63 (06/18 0535) Pulse Rate: 69 (06/18 0535)  Labs: Recent Labs    08/31/17 0253 09/01/17 0259 09/02/17 0539  HGB 10.5* 10.0* 11.2*  HCT 34.0* 32.8* 36.6  PLT 156 167 213  LABPROT 17.3* 19.3* 20.6*  INR 1.43 1.64 1.78    Estimated Creatinine Clearance: 66 mL/min (by C-G formula based on SCr of 0.84 mg/dL).   Assessment: 72 y.o. female with history of Afib on Coumadin PTA.  Patient presented s/p fall and Coumadin was reversed with Vitamin K on 08/26/17.  She was on IV heparin bridge briefly before left hip arthroplasty on 08/28/17.  Coumadin resumed on 08/28/17 and Lovenox bridge for VTE px started 08/29/17.   INR today remains SUB-therapeutic despite higher doses of 7.5mg  daily in setting of Vitamin K Coumadin resistance. However INR continues to trend up (INR 1.78 today). Mild anemia noted; no overt bleeding reported.    Home Coumadin dose: 5mg  daily except 7.5mg  on Sunday   Goal of Therapy:  INR 2 - 3 Monitor platelets by anticoagulation protocol: Yes     Plan:  Coumadin 10mg  PO today Lovenox 40mg  SQ Q24H.  D/C when INR >/= 1.8 per Ortho Daily PT / INR *Suspect INR will jump up soon once Vit K resistance resolves   Ashayla Subia D. Mina Marble, PharmD, BCPS, BCCCP Pager:  [phone] 09/02/2017, 7:59 AM

## 2017-09-02 NOTE — Progress Notes (Signed)
Discharge information discussed with pt and family at bedside. All verbalized understanding of follow up appointments, medication administration, wound care, and when to return to hospital or call physician for signs of infection.

## 2017-09-02 NOTE — Progress Notes (Signed)
Physical Therapy Treatment Patient Details Name: Jennifer House MRN: [id number] DOB: 30-Nov-1945 Today's Date: 09/02/2017    History of Present Illness Pt is a 72 y/o female who sustained a fall at home and is now s/p L THA direct anterior approach. PMH including but not limited to HTN, HLD and a-fib.    PT Comments    Pt performed gait training, stair training and review of standing exercises.  Pt's two daughters present for review of stair training.  Pt tolerated stair training well.  Post stair training returned to room for review of standing HEP, patient unable to grasp concept of L knee extension during L hip extension exercises.  Daughter reports she will work with patient on technique as daughter was able to grasp concept.  Plan to return home this pm.  Pt is ready to d/c home from a mobility stand point.      Follow Up Recommendations  Home health PT;Supervision/Assistance - 24 hour     Equipment Recommendations  Rolling walker with 5" wheels;3in1 (PT)    Recommendations for Other Services       Precautions / Restrictions Precautions Precautions: Fall Precaution Comments: wound VAC L hip Restrictions Weight Bearing Restrictions: Yes LLE Weight Bearing: Weight bearing as tolerated    Mobility  Bed Mobility Overal bed mobility: Needs Assistance Bed Mobility: Supine to Sit     Supine to sit: Supervision;HOB elevated     General bed mobility comments: Increased time and effort but able to achieve sitting edge of bed.    Transfers Overall transfer level: Needs assistance Equipment used: Rolling walker (2 wheeled) Transfers: Sit to/from Stand   Stand pivot transfers: Min guard       General transfer comment: Min guard assist for safety, verbal cues for safe hand placement.   Ambulation/Gait Ambulation/Gait assistance: Min guard Gait Distance (Feet): 120 Feet Assistive device: Rolling walker (2 wheeled) Gait Pattern/deviations: Decreased step length -  right;Decreased stance time - left;Decreased stride length;Decreased weight shift to left;Trunk flexed;Step-through pattern Gait velocity: decreased   General Gait Details: Verbal cues for upright posture, safe RW use, min guard assist for safety. Pt with increased WOB.     Stairs Stairs: Yes Stairs assistance: Min assist Stair Management: No rails;Step to pattern;Backwards;With walker Number of Stairs: 3 General stair comments: cues for sequencing and technique; daughter stabilized RW   Wheelchair Mobility    Modified Rankin (Stroke Patients Only)       Balance Overall balance assessment: Needs assistance Sitting-balance support: Feet supported;No upper extremity supported;Bilateral upper extremity supported Sitting balance-Leahy Scale: Good       Standing balance-Leahy Scale: Poor Standing balance comment: needs external support/assist.                             Cognition Arousal/Alertness: Awake/alert Behavior During Therapy: WFL for tasks assessed/performed Overall Cognitive Status: Within Functional Limits for tasks assessed                                        Exercises Total Joint Exercises Hip ABduction/ADduction: AROM;10 reps;Standing;Left Knee Flexion: AROM;Left;5 reps;Standing Marching in Standing: AROM;Left;10 reps;Standing Standing Hip Extension: (attempted but patient unable to grasp concept to keep L knee straight with hip extension.  )    General Comments        Pertinent Vitals/Pain Pain Assessment: 0-10 Pain Score:  4  Pain Location: L hip incision Pain Descriptors / Indicators: Sore Pain Intervention(s): Monitored during session;Repositioned    Home Living                      Prior Function            PT Goals (current goals can now be found in the care plan section) Acute Rehab PT Goals Patient Stated Goal: return home, decrease pain Potential to Achieve Goals: Good Progress towards PT  goals: Progressing toward goals    Frequency    Min 5X/week      PT Plan Frequency needs to be updated    Co-evaluation              AM-PAC PT "6 Clicks" Daily Activity  Outcome Measure  Difficulty turning over in bed (including adjusting bedclothes, sheets and blankets)?: Unable Difficulty moving from lying on back to sitting on the side of the bed? : Unable Difficulty sitting down on and standing up from a chair with arms (e.g., wheelchair, bedside commode, etc,.)?: Unable Help needed moving to and from a bed to chair (including a wheelchair)?: A Little Help needed walking in hospital room?: A Little Help needed climbing 3-5 steps with a railing? : A Little 6 Click Score: 12    End of Session Equipment Utilized During Treatment: Gait belt Activity Tolerance: Patient limited by fatigue;Patient limited by pain Patient left: in chair;with call bell/phone within reach;with family/visitor present Nurse Communication: Mobility status PT Visit Diagnosis: Muscle weakness (generalized) (M62.81);Difficulty in walking, not elsewhere classified (R26.2);Pain Pain - Right/Left: Left Pain - part of body: Leg     Time: 6063-0160 PT Time Calculation (min) (ACUTE ONLY): 24 min  Charges:  $Gait Training: 8-22 mins $Therapeutic Exercise: 8-22 mins                    G Codes:       Governor Rooks, PTA pager [phone]    Cristela Blue 09/02/2017, 10:27 AM

## 2017-09-02 NOTE — Care Management Important Message (Signed)
Important Message  Patient Details  Name: Jennifer House MRN: [id number] Date of Birth: 1945/04/17   Medicare Important Message Given:  Yes    Cordia Miklos P [place] 09/02/2017, 2:25 PM

## 2017-09-03 ENCOUNTER — Telehealth: Payer: Self-pay

## 2017-09-03 NOTE — Telephone Encounter (Signed)
Transition Care Management Follow-up Telephone Call  Admit date: 08/25/2017 Discharge date: 09/02/2017 Dx: Closed displaced fracture of left femoral neck    How have you been since you were released from the hospital? "I'm wore out and exhausted"   Do you understand why you were in the hospital? yes, pt reports falling out of bed and fracturing hip.    Do you understand the discharge instructions? yes   Where were you discharged to? Home. PT to visit tomorrow.    Items Reviewed:  Medications reviewed: no, pt states no changes. Did not have access to meds at time of call. Advised to bring bottles to appt.   Allergies reviewed: yes  Dietary changes reviewed: yes  Referrals reviewed: yes   Functional Questionnaire:   Activities of Daily Living (ADLs):   She states they are independent in the following: ambulation, bathing and hygiene, feeding, continence, grooming, toileting and dressing. Ambulating with walker.  States they require assistance with the following: None.    Any transportation issues/concerns?: no   Any patient concerns? no   Confirmed importance and date/time of follow-up visits scheduled yes  Provider Appointment booked with PCP 09/09/17 @ 11am.   Confirmed with patient if condition begins to worsen call PCP or go to the ER.  Patient was given the office number and encouraged to call back with question or concerns.  : yes

## 2017-09-04 DIAGNOSIS — G4700 Insomnia, unspecified: Secondary | ICD-10-CM | POA: Diagnosis not present

## 2017-09-04 DIAGNOSIS — I482 Chronic atrial fibrillation: Secondary | ICD-10-CM | POA: Diagnosis not present

## 2017-09-04 DIAGNOSIS — M8580 Other specified disorders of bone density and structure, unspecified site: Secondary | ICD-10-CM | POA: Diagnosis not present

## 2017-09-04 DIAGNOSIS — G2581 Restless legs syndrome: Secondary | ICD-10-CM | POA: Diagnosis not present

## 2017-09-04 DIAGNOSIS — K8581 Other acute pancreatitis with uninfected necrosis: Secondary | ICD-10-CM | POA: Diagnosis not present

## 2017-09-04 DIAGNOSIS — N200 Calculus of kidney: Secondary | ICD-10-CM | POA: Diagnosis not present

## 2017-09-04 DIAGNOSIS — Z7901 Long term (current) use of anticoagulants: Secondary | ICD-10-CM | POA: Diagnosis not present

## 2017-09-04 DIAGNOSIS — S72002D Fracture of unspecified part of neck of left femur, subsequent encounter for closed fracture with routine healing: Secondary | ICD-10-CM | POA: Diagnosis not present

## 2017-09-04 DIAGNOSIS — I10 Essential (primary) hypertension: Secondary | ICD-10-CM | POA: Diagnosis not present

## 2017-09-08 ENCOUNTER — Ambulatory Visit: Payer: Medicare Other | Admitting: *Deleted

## 2017-09-08 DIAGNOSIS — G2581 Restless legs syndrome: Secondary | ICD-10-CM | POA: Diagnosis not present

## 2017-09-08 DIAGNOSIS — Z7901 Long term (current) use of anticoagulants: Secondary | ICD-10-CM | POA: Diagnosis not present

## 2017-09-08 DIAGNOSIS — I482 Chronic atrial fibrillation, unspecified: Secondary | ICD-10-CM

## 2017-09-08 DIAGNOSIS — G4700 Insomnia, unspecified: Secondary | ICD-10-CM | POA: Diagnosis not present

## 2017-09-08 DIAGNOSIS — N200 Calculus of kidney: Secondary | ICD-10-CM | POA: Diagnosis not present

## 2017-09-08 DIAGNOSIS — K8581 Other acute pancreatitis with uninfected necrosis: Secondary | ICD-10-CM | POA: Diagnosis not present

## 2017-09-08 DIAGNOSIS — M8580 Other specified disorders of bone density and structure, unspecified site: Secondary | ICD-10-CM | POA: Diagnosis not present

## 2017-09-08 DIAGNOSIS — S72002D Fracture of unspecified part of neck of left femur, subsequent encounter for closed fracture with routine healing: Secondary | ICD-10-CM | POA: Diagnosis not present

## 2017-09-08 DIAGNOSIS — I10 Essential (primary) hypertension: Secondary | ICD-10-CM | POA: Diagnosis not present

## 2017-09-08 LAB — POCT INR: INR: 1.9 — AB (ref 2.0–3.0)

## 2017-09-08 NOTE — Patient Instructions (Signed)
Description   Today take 1.5 tablets, then continue taking 1 tablet every day except 1.5 tablets on Sundays. Resume your normal leafy green intake.   Recheck INR in 7-10 days.  # [phone] Coumadin clinic. Main # [phone].

## 2017-09-09 ENCOUNTER — Encounter: Payer: Self-pay | Admitting: Family Medicine

## 2017-09-09 ENCOUNTER — Ambulatory Visit (INDEPENDENT_AMBULATORY_CARE_PROVIDER_SITE_OTHER): Payer: Medicare Other | Admitting: Family Medicine

## 2017-09-09 VITALS — BP 114/74 | HR 61 | Temp 97.8°F | Resp 20 | Ht 65.0 in | Wt 209.0 lb

## 2017-09-09 DIAGNOSIS — F331 Major depressive disorder, recurrent, moderate: Secondary | ICD-10-CM

## 2017-09-09 DIAGNOSIS — S72002A Fracture of unspecified part of neck of left femur, initial encounter for closed fracture: Secondary | ICD-10-CM

## 2017-09-09 DIAGNOSIS — I10 Essential (primary) hypertension: Secondary | ICD-10-CM

## 2017-09-09 DIAGNOSIS — Z9289 Personal history of other medical treatment: Secondary | ICD-10-CM

## 2017-09-09 DIAGNOSIS — I2510 Atherosclerotic heart disease of native coronary artery without angina pectoris: Secondary | ICD-10-CM | POA: Insufficient documentation

## 2017-09-09 DIAGNOSIS — G4700 Insomnia, unspecified: Secondary | ICD-10-CM | POA: Diagnosis not present

## 2017-09-09 DIAGNOSIS — I482 Chronic atrial fibrillation, unspecified: Secondary | ICD-10-CM

## 2017-09-09 DIAGNOSIS — Z7901 Long term (current) use of anticoagulants: Secondary | ICD-10-CM

## 2017-09-09 DIAGNOSIS — I700 Atherosclerosis of aorta: Secondary | ICD-10-CM | POA: Insufficient documentation

## 2017-09-09 MED ORDER — TRAZODONE HCL 100 MG PO TABS: 100.0000 mg | ORAL_TABLET | Freq: Every day | ORAL | 1 refills | Status: DC

## 2017-09-09 MED ORDER — AMLODIPINE BESYLATE 10 MG PO TABS: 10.0000 mg | ORAL_TABLET | Freq: Every day | ORAL | 1 refills | Status: DC

## 2017-09-09 MED ORDER — ESCITALOPRAM OXALATE 20 MG PO TABS: 20.0000 mg | ORAL_TABLET | Freq: Every day | ORAL | 1 refills | Status: DC

## 2017-09-09 MED ORDER — HYDROCODONE-ACETAMINOPHEN 5-325 MG PO TABS: 1.0000 | ORAL_TABLET | Freq: Two times a day (BID) | ORAL | 0 refills | Status: DC

## 2017-09-09 MED ORDER — BENAZEPRIL HCL 20 MG PO TABS: 20.0000 mg | ORAL_TABLET | Freq: Every day | ORAL | 1 refills | Status: DC

## 2017-09-09 MED ORDER — METOPROLOL TARTRATE 50 MG PO TABS: 25.0000 mg | ORAL_TABLET | Freq: Two times a day (BID) | ORAL | 1 refills | Status: DC

## 2017-09-09 NOTE — Progress Notes (Signed)
Jennifer House , 20-Apr-1945, 72 y.o., female MRN: [id number] Patient Care Team    Relationship Specialty Notifications Start End  Ma Hillock, DO PCP - General Family Medicine  01/23/15   Thompson Grayer, MD Attending Physician Cardiology  07/25/11   Ronald Lobo, MD Consulting Physician Gastroenterology  08/25/15   Rana Snare, MD Consulting Physician Urology  08/25/15   Renelda Loma, OD  Optometry  08/25/15    Comment: "Jennifer House" at [facility]    Chief Complaint  Patient presents with  . Hospitalization Follow-up    TCM     Subjective:  Jennifer House  is a 72 y.o. female presents for hospital follow up after recent admission on 08/25/2017 for primary diagnosis Closed left femoral neck displaced fracture. She was discharged on 09/02/2017 to home with HHPT. Patients discharge summary has been reviewed, as well as all labs/image studies obtained during hospitalization.   Patients hospital course: She reports she fell out of bed. She thins she was too close to the edge and got caught up in her blankets. She was placed in traction and observed until coumadin was managed. She underwent left total hip arthroplasty, anterior approach under general anesthesia, by Dr. Lyla Glassing.  Since hospital discharge patient reports she is doing rather well. She is working with PT twice a week. Her daughter is staying with her also. She is taking the pain medicine provided at her hospital discharge about twice  A day now. She is not taking the muscle relaxer. She had her INR checked yesterday at coumadin clinic. She has Ortho follow up this Friday for stitch removal.  She is eating and drinking well. Walking well with her walker. She denies fever, chills, nausea, lower ext pain or swelling, cough or fever. She reports her incision site is healing well.   No results for input(s): HGB, HCT, WBC, PLT in the last 168 hours. CMP Latest Ref Rng & Units 08/30/2017 08/29/2017 08/27/2017  Glucose 65 - 99 mg/dL  127(H) 160(H) 96  BUN 6 - 20 mg/dL 18 17 13   Creatinine 0.44 - 1.00 mg/dL 0.84 0.85 0.81  Sodium 135 - 145 mmol/L 137 136 140  Potassium 3.5 - 5.1 mmol/L 3.8 4.4 3.9  Chloride 101 - 111 mmol/L 104 105 106  CO2 22 - 32 mmol/L 27 27 26   Calcium 8.9 - 10.3 mg/dL 8.6(L) 8.5(L) 9.0  Total Protein 6.1 - 8.1 g/dL - - -  Total Bilirubin 0.2 - 1.2 mg/dL - - -  Alkaline Phos 33 - 130 U/L - - -  AST 10 - 35 U/L - - -  ALT 6 - 29 U/L - - -   CBC Latest Ref Rng & Units 09/02/2017 09/01/2017 08/31/2017  WBC 4.0 - 10.5 K/uL 5.7 6.9 7.2  Hemoglobin 12.0 - 15.0 g/dL 11.2(L) 10.0(L) 10.5(L)  Hematocrit 36.0 - 46.0 % 36.6 32.8(L) 34.0(L)  Platelets 150 - 400 K/uL 213 167 156      Dg Elbow Complete Left (3+view)  Result Date: 08/31/2017 CLINICAL DATA:  Fall on left side.  Elbow pain EXAM: LEFT ELBOW - COMPLETE 3+ VIEW COMPARISON:  None. FINDINGS: No acute bony abnormality. Specifically, no fracture, subluxation, or dislocation. No joint effusion. Joint spaces are maintained. IMPRESSION: No acute bony abnormality. Electronically Signed   By: Rolm Baptise M.D.   On: 08/31/2017 08:21   Ct Head Wo Contrast  Result Date: 08/25/2017 CLINICAL DATA:  [place] out of bed today striking the left side  of the face and head. Some headache. EXAM: CT HEAD WITHOUT CONTRAST TECHNIQUE: Contiguous axial images were obtained from the base of the skull through the vertex without intravenous contrast. COMPARISON:  None. FINDINGS: Brain: Brain atrophy. Extensive chronic small-vessel ischemic changes affecting the cerebral hemispheric deep and subcortical white matter. Small-vessel changes also of the brainstem, basal ganglia and thalami. No sign of acute infarction, mass lesion, hemorrhage, hydrocephalus or extra-axial collection. Vascular: There is atherosclerotic calcification of the major vessels at the base of the brain. Skull: No fracture. Sinuses/Orbits: Clear/normal Other: None IMPRESSION: No acute or traumatic finding. Brain  atrophy with extensive chronic small-vessel ischemic changes throughout. Electronically Signed   By: Nelson Chimes M.D.   On: 08/25/2017 09:28   Ct Chest Wo Contrast  Result Date: 08/26/2017 CLINICAL DATA:  Inpatient. New small focal opacity in left lung on chest radiograph from 1 day prior. EXAM: CT CHEST WITHOUT CONTRAST TECHNIQUE: Multidetector CT imaging of the chest was performed following the standard protocol without IV contrast. COMPARISON:  Chest radiograph from one day prior. FINDINGS: Cardiovascular: Top-normal heart size. No significant pericardial effusion/thickening. Left anterior descending and left circumflex coronary atherosclerosis. Atherosclerotic nonaneurysmal thoracic aorta. Normal caliber pulmonary arteries. Mediastinum/Nodes: No discrete thyroid nodules. Unremarkable esophagus. No pathologically enlarged axillary, mediastinal or hilar lymph nodes, noting limited sensitivity for the detection of hilar adenopathy on this noncontrast study. Lungs/Pleura: No pneumothorax. No pleural effusion. No acute consolidative airspace disease, lung masses or significant pulmonary nodules. There are several scattered small parenchymal bands in the mid to lower lungs bilaterally. Upper abdomen: Small hiatal hernia.  Diffuse hepatic steatosis. Musculoskeletal: No aggressive appearing focal osseous lesions. Mild thoracic spondylosis. IMPRESSION: 1. No lung masses or significant pulmonary nodules. 2. Scattered small parenchymal bands in the mid to lower lungs bilaterally, compatible with mild scarring or atelectasis, which accounts for the left lower lung opacity described on the chest radiograph from 1 day prior. 3. Two-vessel coronary atherosclerosis. 4. Small hiatal hernia. 5. Diffuse hepatic steatosis. Aortic Atherosclerosis (ICD10-I70.0). Electronically Signed   By: Ilona Sorrel M.D.   On: 08/26/2017 12:13   Pelvis Portable  Result Date: 08/28/2017 CLINICAL DATA:  Post left total hip replacement.  EXAM: PORTABLE PELVIS 1-2 VIEWS COMPARISON:  08/25/2017 and earlier same day. FINDINGS: Examination demonstrates patient's recent left total hip arthroplasty with prosthetic components normally located and intact. Mild degenerate change of the right hip and spine. IMPRESSION: Expected changes post left total hip arthroplasty. Electronically Signed   By: Marin Olp M.D.   On: 08/28/2017 19:44   Dg Chest Port 1 View  Result Date: 08/25/2017 CLINICAL DATA:  Preop cardiovascular exam [phone], HTN, former smoker - quit 30 years ago EXAM: PORTABLE CHEST 1 VIEW COMPARISON:  Chest x-ray dated 08/29/2014. FINDINGS: Cardiomegaly. Aortic atherosclerosis. New small opacity within the LEFT lower lung. Lungs otherwise clear. No pleural effusion or pneumothorax seen. No acute or suspicious osseous finding. IMPRESSION: 1. New small ill-defined opacity within the LEFT lower lung. This is most likely focal scarring or atelectasis but chest CT is recommended to ensure benignity given the smoking history. 2. Cardiomegaly. 3. Aortic atherosclerosis. Electronically Signed   By: Franki Cabot M.D.   On: 08/25/2017 11:01   Dg Knee Left Port  Result Date: 08/25/2017 CLINICAL DATA:  72 year old female with a history of fall and left knee pain EXAM: PORTABLE LEFT KNEE - 1-2 VIEW COMPARISON:  None. FINDINGS: No acute displaced fracture. Medial greater than lateral joint space narrowing. Marginal osteophyte formation. No joint  effusion. Degenerative changes of the patellofemoral joint. IMPRESSION: No acute bony abnormality identified. Tricompartmental osteoarthritis Electronically Signed   By: Corrie Mckusick D.O.   On: 08/25/2017 13:40   Dg C-arm 1-60 Min  Result Date: 08/28/2017 CLINICAL DATA:  Hip arthroplasty EXAM: OPERATIVE left HIP (WITH PELVIS IF PERFORMED) 2 VIEWS TECHNIQUE: Fluoroscopic spot image(s) were submitted for interpretation post-operatively. COMPARISON:  08/25/2017 FINDINGS: Total fluoroscopy time was 17 seconds.  Two low resolution intraoperative spot views of the left hip. The images demonstrate a left hip replacement with normal alignment. IMPRESSION: Intraoperative fluoroscopic assistance provided during left hip replacement Electronically Signed   By: Donavan Foil M.D.   On: 08/28/2017 19:35   Dg Hip Operative Unilat W Or W/o Pelvis Left  Result Date: 08/28/2017 CLINICAL DATA:  Hip arthroplasty EXAM: OPERATIVE left HIP (WITH PELVIS IF PERFORMED) 2 VIEWS TECHNIQUE: Fluoroscopic spot image(s) were submitted for interpretation post-operatively. COMPARISON:  08/25/2017 FINDINGS: Total fluoroscopy time was 17 seconds. Two low resolution intraoperative spot views of the left hip. The images demonstrate a left hip replacement with normal alignment. IMPRESSION: Intraoperative fluoroscopic assistance provided during left hip replacement Electronically Signed   By: Donavan Foil M.D.   On: 08/28/2017 19:35   Dg Hip Unilat W Or Wo Pelvis 2-3 Views Left  Result Date: 08/25/2017 CLINICAL DATA:  Left hip pain since the patient fell out of bed this morning. Initial encounter. EXAM: DG HIP (WITH OR WITHOUT PELVIS) 2-3V LEFT COMPARISON:  None. FINDINGS: The patient has an acute fracture through the base of the left femoral neck. The femoral head is located. No other abnormality is identified. IMPRESSION: Acute basicervical fracture left hip. Electronically Signed   By: Inge Rise M.D.   On: 08/25/2017 09:10     Depression screen [facility] 2/9 09/09/2017 04/30/2017 03/26/2017 09/19/2016 09/19/2016  Decreased Interest 0 0 0 0 0  Down, Depressed, Hopeless 0 0 0 0 0  PHQ - 2 Score 0 0 0 0 0  Altered sleeping - - 0 - -  Tired, decreased energy - - 3 - -  Change in appetite - - 0 - -  Feeling bad or failure about yourself  - - 0 - -  Trouble concentrating - - 0 - -  Moving slowly or fidgety/restless - - 3 - -  Suicidal thoughts - - 0 - -  PHQ-9 Score - - 6 - -  Difficult doing work/chores - - Not difficult at all - -     Allergies  Allergen Reactions  . Ibandronate Sodium Other (See Comments)    Bones hurt  . Pravachol [Pravastatin Sodium] Other (See Comments)    Myalgias   . Ultram [Tramadol Hcl] Nausea Only   Social History   Tobacco Use  . Smoking status: Former Smoker    Last attempt to quit: 03/18/1985    Years since quitting: 32.5  . Smokeless tobacco: Never Used  . Tobacco comment: denies   Substance Use Topics  . Alcohol use: No   Past Medical History:  Diagnosis Date  . Anxiety   . Atrial fibrillation ([place])    longstanding persistent  . [place]   transitional cell hx; Dr. Risa Grill  . Colon polyps   . Depression   . GERD (gastroesophageal reflux disease)   . Headache(784.0)    hx of migraines  . Hematuria    hx  . HLD (hyperlipidemia)    mixed  . HTN (hypertension)   . Hypothyroidism   .  Insomnia   . Liver function test abnormality    hx fatty liver  . Murmur, cardiac   . Nonalcoholic fatty liver disease   . Obesity   . Osteoarthritis    low back pain with left sciatica  . Osteopenia    dexa 2013  . Renal calculus    hx  . Restless leg syndrome    Past Surgical History:  Procedure Laterality Date  . APPENDECTOMY    . CARDIAC CATHETERIZATION  1/12   revealed normal cors and LV function  . COLONOSCOPY    . cystocopy  10/17/03   left stent placement, TURBT  . FOOT SURGERY    . LUMBAR LAMINECTOMY/DECOMPRESSION MICRODISCECTOMY N/A 01/19/2013   Procedure: LUMBAR THREE FOUR LUMBAR LAMINECTOMY/DECOMPRESSION MICRODISCECTOMY 1 LEVEL;  Surgeon: Faythe Ghee, MD;  Location: [facility];  Service: Neurosurgery;  Laterality: N/A;  . TOTAL ABDOMINAL HYSTERECTOMY  1985  . TOTAL HIP ARTHROPLASTY Left 08/28/2017   Procedure: TOTAL HIP ARTHROPLASTY ANTERIOR APPROACH;  Surgeon: Rod Can, MD;  Location: [place];  Service: Orthopedics;  Laterality: Left;   Family History  Problem Relation Age of Onset  . Heart failure Mother   . Heart attack  Mother   . Hypertension Mother   . Thyroid cancer Mother   . Stroke Father   . Hypertension Father   . Stroke Brother   . Breast cancer Neg Hx   . Colon cancer Neg Hx    Allergies as of 09/09/2017      Reactions   Ibandronate Sodium Other (See Comments)   Bones hurt   Pravachol [pravastatin Sodium] Other (See Comments)   Myalgias   Ultram [tramadol Hcl] Nausea Only      Medication List        Accurate as of 09/09/17 11:01 AM. Always use your most recent med list.          amLODipine 10 MG tablet Commonly known as:  NORVASC Take 1 tablet (10 mg total) by mouth daily.   benazepril 20 MG tablet Commonly known as:  LOTENSIN Take 1 tablet (20 mg total) by mouth daily. Needs office visit prior to anymore refills.   CALTRATE 600 1500 (600 Ca) MG Tabs tablet Generic drug:  calcium carbonate Take 3 tablets by mouth daily.   CENTRUM SILVER tablet Take 1 tablet by mouth daily.   cholecalciferol 1000 units tablet Commonly known as:  VITAMIN D Take 1,000 Units by mouth daily.   escitalopram 20 MG tablet Commonly known as:  LEXAPRO Take 1 tablet (20 mg total) by mouth daily.   ezetimibe 10 MG tablet Commonly known as:  ZETIA Take 1 tablet (10 mg total) by mouth daily.   furosemide 20 MG tablet Commonly known as:  LASIX TAKE 1 TABLET BY MOUTH EVERY MONDAY, WEDNESDAY AND FRIDAY   HYDROcodone-acetaminophen 5-325 MG tablet Commonly known as:  NORCO/VICODIN Take 1-2 tablets by mouth every 6 (six) hours as needed for moderate pain.   hydrOXYzine 25 MG capsule Commonly known as:  VISTARIL Take 1 capsule (25 mg total) by mouth at bedtime.   levothyroxine 75 MCG tablet Commonly known as:  SYNTHROID, LEVOTHROID Take 1 tablet (75 mcg total) by mouth daily.   methocarbamol 500 MG tablet Commonly known as:  ROBAXIN Take 1 tablet (500 mg total) by mouth every 6 (six) hours as needed for muscle spasms.   metoprolol tartrate 50 MG tablet Commonly known as:  LOPRESSOR Take  0.5 tablets (25 mg total) by mouth 2 (two)  times daily.   nitroGLYCERIN 0.4 MG SL tablet Commonly known as:  NITROSTAT PLACE 1 TABLET UNDER THE TONGUE EVERY 5 MINUTES AS NEEDED FOR CHEST PAIN,(MAX 3 TABLETS)   polyethylene glycol packet Commonly known as:  MIRALAX / GLYCOLAX Take 17 g by mouth daily as needed for mild constipation or moderate constipation.   senna-docusate 8.6-50 MG tablet Commonly known as:  SENOKOT S Take 2 tablets by mouth at bedtime.   traZODone 50 MG tablet Commonly known as:  DESYREL TAKE 1 TO 2 TABLETS(50 TO 100 MG) BY MOUTH AT BEDTIME AS NEEDED FOR SLEEP   warfarin 5 MG tablet Commonly known as:  COUMADIN Take as directed by the anticoagulation clinic. If you are unsure how to take this medication, talk to your nurse or doctor. Original instructions:  TAKE AS DIRECTED BY COUMADIN CLINIC       All past medical history, surgical history, allergies, family history, immunizations and medications were updated in the EMR today and reviewed under the history and medication portions of their EMR.      ROS: Negative, with the exception of above mentioned in HPI   Objective:  BP 114/74 (BP Location: Right Arm, Patient Position: Sitting, Cuff Size: Large)   Pulse 61   Temp 97.8 F (36.6 C)   Resp 20   Ht 5\' 5"  (1.651 m)   Wt 209 lb (94.8 kg)   SpO2 96%   BMI 34.78 kg/m  Body mass index is 34.78 kg/m. Gen: Afebrile. No acute distress. Nontoxic in appearance, well developed, well nourished. Pleasant caucasian female. HENT: AT. [place]. Bilateral TM visualized wnl. MMM, no oral lesions. Bilateral nares WNL. Throat without erythema or exudates. No cough or hoarseness Eyes:Pupils Equal Round Reactive to light, Extraocular movements intact,  Conjunctiva without redness, discharge or icterus. Neck/lymp/endocrine: Supple,no lymphadenopathy CV: irregularly irregular, no murmur, no edema Chest: CTAB, no wheeze or crackles. Good air movement, normal resp effort.  Abd:  Soft. NTND. BS present. no Masses palpated. No rebound or guarding.  MSK: no erythema, or soft tissue swelling bilateral lower ext. Neg Homans, NV intact distally.  Skin: mo rashes, purpura or petechiae.  Neuro: walking very well with walker. PERLA. EOMi. Alert. Oriented x3  Psych: Normal affect, dress and demeanor. Normal speech. Normal thought content and judgment.  Assessment/Plan: KALESE ENSZ is a 72 y.o. female present for OV for Hospital discharge follow up Insomnia, unspecified type - refilled for her today. 1/2- 1 tab nightly.  - traZODone (DESYREL) 100 MG tablet; Take 1 tablet (100 mg total) by mouth at bedtime.  Dispense: 90 tablet; Refill: 1 Essential hypertension, benign/chronic a.fib./ atherosclerosis of aorta and 2-vessel coronary arteries: Stable. Refilled meds today.  - on warfarin and Zeita. Intolerant to statin. - benazepril (LOTENSIN) 20 MG tablet; Take 1 tablet (20 mg total) by mouth daily. Needs office visit prior to anymore refills.  Dispense: 90 tablet; Refill: 1 - metoprolol tartrate (LOPRESSOR) 50 MG tablet; Take 0.5 tablets (25 mg total) by mouth 2 (two) times daily.  Dispense: 45 tablet; Refill: 1 - amLODipine (NORVASC) 10 MG tablet; Take 1 tablet (10 mg total) by mouth daily.  Dispense: 90 tablet; Refill: 1 Moderate episode of recurrent major depressive disorder (HCC) Stable. Refilled meds today - escitalopram (LEXAPRO) 20 MG tablet; Take 1 tablet (20 mg total) by mouth daily.  Dispense: 90 tablet; Refill: 1  History of recent hospitalization/Closed displaced fracture of left femoral neck ([place]) She looks really good for having a recent fracture of  her hip. She is working with PT twice a week and her daughter is helping her with home exercises. Her daughter is staying with her as well for assistance.  She is using pain meds about twice a day only. She is not using the robaxin. Discussed post op pain meds and using tapering slowly down to off as able. Provided  with her a 5 day script norco BID, IF needed only she will fill. Otherwise, she will taper off with current script.  - discussed over sedation precautions.  - She has follow up with ortho to take the stitches out this Friday.   Long term (current) use of anticoagulants INR 1.9 yesterday, dose was adjusted at coumadin clinic and she has repeat appt on Monday Transition of care completed today and her routine chronic medical conditions covered also today since she was needing refills on her chronic meds.   Reviewed expectations re: course of current medical issues.  Discussed self-management of symptoms.  Outlined signs and symptoms indicating need for more acute intervention.  Patient verbalized understanding and all questions were answered.  Patient received an After-Visit Summary.  Any changes in medications were reviewed and patient was provided with updated med list with their AVS.      No orders of the defined types were placed in this encounter.    Note is dictated utilizing voice recognition software. Although note has been proof read prior to signing, occasional typographical errors still can be missed. If any questions arise, please do not hesitate to call for verification.   electronically signed by:  Howard Pouch, DO  [place]

## 2017-09-09 NOTE — Patient Instructions (Signed)
Trazodone is now 100 mg per tab--> only take one or a 1/2 now.  I also refilled our other medications today. Remember to have your INR done Monday. Short term pain script for 5 days, IF needed only, otherwise rip/shred the script. After 7-10 days of pain meds, patients should try to cut back on amount slowly.  You are doing great... Keep up the good work.   Please help [place] help you:  We are honored you have chosen [facility] for your Primary Care home. Below you will find basic instructions that you may need to access in the future. Please help [place] help you by reading the instructions, which cover many of the frequent questions we experience.   Prescription refills and request:  -In order to allow more efficient response time, please call your pharmacy for all refills. They will forward the request electronically to [place]. This allows for the quickest possible response. Request left on a nurse line can take longer to refill, since these are checked as time allows between office patients and other phone calls.  - refill request can take up to 3-5 working days to complete.  - If request is sent electronically and request is appropiate, it is usually completed in 1-2 business days.  - all patients will need to be seen routinely for all chronic medical conditions requiring prescription medications (see follow-up below). If you are overdue for follow up on your condition, you will be asked to make an appointment and we will call in enough medication to cover you until your appointment (up to 30 days).  - all controlled substances will require a face to face visit to request/refill.  - if you desire your prescriptions to go through a new pharmacy, and have an active script at original pharmacy, you will need to call your pharmacy and have scripts transferred to new pharmacy. This is completed between the pharmacy locations and not by your provider.    Results: If any images or labs were ordered, it can  take up to 1 week to get results depending on the test ordered and the lab/facility running and resulting the test. - Normal or stable results, which do not need further discussion, may be released to your mychart immediately with attached note to you. A call may not be generated for normal results. Please make certain to sign up for mychart. If you have questions on how to activate your mychart you can call the front office.  - If your results need further discussion, our office will attempt to contact you via phone, and if unable to reach you after 2 attempts, we will release your abnormal result to your mychart with instructions.  - All results will be automatically released in mychart after 1 week.  - Your provider will provide you with explanation and instruction on all relevant material in your results. Please keep in mind, results and labs may appear confusing or abnormal to the untrained eye, but it does not mean they are actually abnormal for you personally. If you have any questions about your results that are not covered, or you desire more detailed explanation than what was provided, you should make an appointment with your provider to do so.   Our office handles many outgoing and incoming calls daily. If we have not contacted you within 1 week about your results, please check your mychart to see if there is a message first and if not, then contact our office.  In helping with  this matter, you help decrease call volume, and therefore allow [place] to be able to respond to patients needs more efficiently.   Acute office visits (sick visit):  An acute visit is intended for a new problem and are scheduled in shorter time slots to allow schedule openings for patients with new problems. This is the appropriate visit to discuss a new problem. Problems will not be addressed by phone call or Echart message. Appointment is needed if requesting treatment. In order to provide you with excellent quality medical  care with proper time for you to explain your problem, have an exam and receive treatment with instructions, these appointments should be limited to one new problem per visit. If you experience a new problem, in which you desire to be addressed, please make an acute office visit, we save openings on the schedule to accommodate you. Please do not save your new problem for any other type of visit, let us take care of it properly and quickly for you.   Follow up visits:  Depending on your condition(s) your provider will need to see you routinely in order to provide you with quality care and prescribe medication(s). Most chronic conditions (Example: hypertension, Diabetes, depression/anxiety... etc), require visits a couple times a year. Your provider will instruct you on proper follow up for your personal medical conditions and history. Please make certain to make follow up appointments for your condition as instructed. Failing to do so could result in lapse in your medication treatment/refills. If you request a refill, and are overdue to be seen on a condition, we will always provide you with a 30 day script (once) to allow you time to schedule.    Medicare wellness (well visit): - we have a wonderful Nurse Maudie Mercury), that will meet with you and provide you will yearly medicare wellness visits. These visits should occur yearly (can not be scheduled less than 1 calendar year apart) and cover preventive health, immunizations, advance directives and screenings you are entitled to yearly through your medicare benefits. Do not miss out on your entitled benefits, this is when medicare will pay for these benefits to be ordered for you.  These are strongly encouraged by your provider and is the appropriate type of visit to make certain you are up to date with all preventive health benefits. If you have not had your medicare wellness exam in the last 12 months, please make certain to schedule one by calling the office and  schedule your medicare wellness with Maudie Mercury as soon as possible.   Yearly physical (well visit):  - Adults are recommended to be seen yearly for physicals. Check with your insurance and date of your last physical, most insurances require one calendar year between physicals. Physicals include all preventive health topics, screenings, medical exam and labs that are appropriate for gender/age and history. You may have fasting labs needed at this visit. This is a well visit (not a sick visit), new problems should not be covered during this visit (see acute visit).  - Pediatric patients are seen more frequently when they are younger. Your provider will advise you on well child visit timing that is appropriate for your their age. - This is not a medicare wellness visit. Medicare wellness exams do not have an exam portion to the visit. Some medicare companies allow for a physical, some do not allow a yearly physical. If your medicare allows a yearly physical you can schedule the medicare wellness with our nurse Maudie Mercury and have your  physical with your provider after, on the same day. Please check with insurance for your full benefits.   Late Policy/No Shows:  - all new patients should arrive 15-30 minutes earlier than appointment to allow [place] time  to  obtain all personal demographics,  insurance information and for you to complete office paperwork. - All established patients should arrive 10-15 minutes earlier than appointment time to update all information and be checked in .  - In our best efforts to run on time, if you are late for your appointment you will be asked to either reschedule or if able, we will work you back into the schedule. There will be a wait time to work you back in the schedule,  depending on availability.  - If you are unable to make it to your appointment as scheduled, please call 24 hours ahead of time to allow [place] to fill the time slot with someone else who needs to be seen. If you do not  cancel your appointment ahead of time, you may be charged a no show fee.

## 2017-09-10 DIAGNOSIS — G2581 Restless legs syndrome: Secondary | ICD-10-CM | POA: Diagnosis not present

## 2017-09-10 DIAGNOSIS — Z7901 Long term (current) use of anticoagulants: Secondary | ICD-10-CM | POA: Diagnosis not present

## 2017-09-10 DIAGNOSIS — M8580 Other specified disorders of bone density and structure, unspecified site: Secondary | ICD-10-CM | POA: Diagnosis not present

## 2017-09-10 DIAGNOSIS — I10 Essential (primary) hypertension: Secondary | ICD-10-CM | POA: Diagnosis not present

## 2017-09-10 DIAGNOSIS — K8581 Other acute pancreatitis with uninfected necrosis: Secondary | ICD-10-CM | POA: Diagnosis not present

## 2017-09-10 DIAGNOSIS — G4700 Insomnia, unspecified: Secondary | ICD-10-CM | POA: Diagnosis not present

## 2017-09-10 DIAGNOSIS — N200 Calculus of kidney: Secondary | ICD-10-CM | POA: Diagnosis not present

## 2017-09-10 DIAGNOSIS — I482 Chronic atrial fibrillation: Secondary | ICD-10-CM | POA: Diagnosis not present

## 2017-09-10 DIAGNOSIS — S72002D Fracture of unspecified part of neck of left femur, subsequent encounter for closed fracture with routine healing: Secondary | ICD-10-CM | POA: Diagnosis not present

## 2017-09-12 DIAGNOSIS — S72032D Displaced midcervical fracture of left femur, subsequent encounter for closed fracture with routine healing: Secondary | ICD-10-CM | POA: Diagnosis not present

## 2017-09-14 ENCOUNTER — Other Ambulatory Visit: Payer: Self-pay | Admitting: Internal Medicine

## 2017-09-15 ENCOUNTER — Ambulatory Visit: Payer: Medicare Other | Admitting: Pharmacist

## 2017-09-15 DIAGNOSIS — K8581 Other acute pancreatitis with uninfected necrosis: Secondary | ICD-10-CM | POA: Diagnosis not present

## 2017-09-15 DIAGNOSIS — Z7901 Long term (current) use of anticoagulants: Secondary | ICD-10-CM | POA: Diagnosis not present

## 2017-09-15 DIAGNOSIS — M8580 Other specified disorders of bone density and structure, unspecified site: Secondary | ICD-10-CM | POA: Diagnosis not present

## 2017-09-15 DIAGNOSIS — G2581 Restless legs syndrome: Secondary | ICD-10-CM | POA: Diagnosis not present

## 2017-09-15 DIAGNOSIS — G4700 Insomnia, unspecified: Secondary | ICD-10-CM | POA: Diagnosis not present

## 2017-09-15 DIAGNOSIS — S72002D Fracture of unspecified part of neck of left femur, subsequent encounter for closed fracture with routine healing: Secondary | ICD-10-CM | POA: Diagnosis not present

## 2017-09-15 DIAGNOSIS — I10 Essential (primary) hypertension: Secondary | ICD-10-CM | POA: Diagnosis not present

## 2017-09-15 DIAGNOSIS — N200 Calculus of kidney: Secondary | ICD-10-CM | POA: Diagnosis not present

## 2017-09-15 DIAGNOSIS — I482 Chronic atrial fibrillation: Secondary | ICD-10-CM | POA: Diagnosis not present

## 2017-09-15 LAB — POCT INR: INR: 2.3 (ref 2.0–3.0)

## 2017-09-15 NOTE — Patient Instructions (Signed)
Description   Continue taking 1 tablet every day except 1.5 tablets on Sundays. Resume your normal leafy green intake.   Recheck INR in 4 weeks. # [phone] Coumadin clinic. Main # [phone].

## 2017-09-17 DIAGNOSIS — I10 Essential (primary) hypertension: Secondary | ICD-10-CM | POA: Diagnosis not present

## 2017-09-17 DIAGNOSIS — M8580 Other specified disorders of bone density and structure, unspecified site: Secondary | ICD-10-CM | POA: Diagnosis not present

## 2017-09-17 DIAGNOSIS — N200 Calculus of kidney: Secondary | ICD-10-CM | POA: Diagnosis not present

## 2017-09-17 DIAGNOSIS — Z7901 Long term (current) use of anticoagulants: Secondary | ICD-10-CM | POA: Diagnosis not present

## 2017-09-17 DIAGNOSIS — G2581 Restless legs syndrome: Secondary | ICD-10-CM | POA: Diagnosis not present

## 2017-09-17 DIAGNOSIS — I482 Chronic atrial fibrillation: Secondary | ICD-10-CM | POA: Diagnosis not present

## 2017-09-17 DIAGNOSIS — K8581 Other acute pancreatitis with uninfected necrosis: Secondary | ICD-10-CM | POA: Diagnosis not present

## 2017-09-17 DIAGNOSIS — S72002D Fracture of unspecified part of neck of left femur, subsequent encounter for closed fracture with routine healing: Secondary | ICD-10-CM | POA: Diagnosis not present

## 2017-09-17 DIAGNOSIS — G4700 Insomnia, unspecified: Secondary | ICD-10-CM | POA: Diagnosis not present

## 2017-09-22 ENCOUNTER — Ambulatory Visit: Payer: Medicare Other | Admitting: Family Medicine

## 2017-09-22 DIAGNOSIS — Z7901 Long term (current) use of anticoagulants: Secondary | ICD-10-CM | POA: Diagnosis not present

## 2017-09-22 DIAGNOSIS — M8580 Other specified disorders of bone density and structure, unspecified site: Secondary | ICD-10-CM | POA: Diagnosis not present

## 2017-09-22 DIAGNOSIS — G2581 Restless legs syndrome: Secondary | ICD-10-CM | POA: Diagnosis not present

## 2017-09-22 DIAGNOSIS — S72002D Fracture of unspecified part of neck of left femur, subsequent encounter for closed fracture with routine healing: Secondary | ICD-10-CM | POA: Diagnosis not present

## 2017-09-22 DIAGNOSIS — G4700 Insomnia, unspecified: Secondary | ICD-10-CM | POA: Diagnosis not present

## 2017-09-22 DIAGNOSIS — K8581 Other acute pancreatitis with uninfected necrosis: Secondary | ICD-10-CM | POA: Diagnosis not present

## 2017-09-22 DIAGNOSIS — I482 Chronic atrial fibrillation: Secondary | ICD-10-CM | POA: Diagnosis not present

## 2017-09-22 DIAGNOSIS — N200 Calculus of kidney: Secondary | ICD-10-CM | POA: Diagnosis not present

## 2017-09-22 DIAGNOSIS — I10 Essential (primary) hypertension: Secondary | ICD-10-CM | POA: Diagnosis not present

## 2017-09-24 DIAGNOSIS — M8580 Other specified disorders of bone density and structure, unspecified site: Secondary | ICD-10-CM | POA: Diagnosis not present

## 2017-09-24 DIAGNOSIS — S72002D Fracture of unspecified part of neck of left femur, subsequent encounter for closed fracture with routine healing: Secondary | ICD-10-CM | POA: Diagnosis not present

## 2017-09-24 DIAGNOSIS — G2581 Restless legs syndrome: Secondary | ICD-10-CM | POA: Diagnosis not present

## 2017-09-24 DIAGNOSIS — Z7901 Long term (current) use of anticoagulants: Secondary | ICD-10-CM | POA: Diagnosis not present

## 2017-09-24 DIAGNOSIS — I482 Chronic atrial fibrillation: Secondary | ICD-10-CM | POA: Diagnosis not present

## 2017-09-24 DIAGNOSIS — G4700 Insomnia, unspecified: Secondary | ICD-10-CM | POA: Diagnosis not present

## 2017-09-24 DIAGNOSIS — N200 Calculus of kidney: Secondary | ICD-10-CM | POA: Diagnosis not present

## 2017-09-24 DIAGNOSIS — I10 Essential (primary) hypertension: Secondary | ICD-10-CM | POA: Diagnosis not present

## 2017-09-24 DIAGNOSIS — K8581 Other acute pancreatitis with uninfected necrosis: Secondary | ICD-10-CM | POA: Diagnosis not present

## 2017-09-29 ENCOUNTER — Other Ambulatory Visit: Payer: Self-pay

## 2017-09-29 MED ORDER — LEVOTHYROXINE SODIUM 75 MCG PO TABS: 75.0000 ug | ORAL_TABLET | Freq: Every day | ORAL | 0 refills | Status: DC

## 2017-10-02 ENCOUNTER — Other Ambulatory Visit: Payer: Self-pay | Admitting: Family Medicine

## 2017-10-02 DIAGNOSIS — I10 Essential (primary) hypertension: Secondary | ICD-10-CM

## 2017-10-02 NOTE — Telephone Encounter (Signed)
Patient last seen for Hospital follow up 09/09/17 no mention in note if this medication was continued or DC'd last refill was 12/06/16 45 tabs with 3 refills. Please advise

## 2017-10-03 NOTE — Telephone Encounter (Signed)
Medication is still present on Dc summary from hospital as taking every M,W,F. However, call pt and verify use and frequency. If she is still taking it, it can be refilled--> pended order.

## 2017-10-10 ENCOUNTER — Ambulatory Visit: Payer: Medicare Other

## 2017-10-10 DIAGNOSIS — I4891 Unspecified atrial fibrillation: Secondary | ICD-10-CM

## 2017-10-10 DIAGNOSIS — S72032D Displaced midcervical fracture of left femur, subsequent encounter for closed fracture with routine healing: Secondary | ICD-10-CM | POA: Diagnosis not present

## 2017-10-10 DIAGNOSIS — Z7901 Long term (current) use of anticoagulants: Secondary | ICD-10-CM

## 2017-10-10 LAB — POCT INR: INR: 3.1 — AB (ref 2.0–3.0)

## 2017-10-10 NOTE — Patient Instructions (Signed)
Description   Take 1/2 tablet today, then resume same dosage 1 tablet every day except 1.5 tablets on Sundays.   Recheck INR in 4 weeks. # [phone] Coumadin clinic. Main # [phone].

## 2017-11-07 ENCOUNTER — Ambulatory Visit: Payer: Medicare Other | Admitting: Pharmacist

## 2017-11-07 DIAGNOSIS — I4891 Unspecified atrial fibrillation: Secondary | ICD-10-CM | POA: Diagnosis not present

## 2017-11-07 DIAGNOSIS — Z7901 Long term (current) use of anticoagulants: Secondary | ICD-10-CM

## 2017-11-07 LAB — POCT INR: INR: 3 (ref 2.0–3.0)

## 2017-11-07 NOTE — Patient Instructions (Signed)
Description   Take 1/2 tablet today, then resume same dosage 1 tablet every day except 1.5 tablets on Sundays.   Recheck INR in 4 weeks. # [phone] Coumadin clinic. Main # [phone].

## 2017-11-11 ENCOUNTER — Other Ambulatory Visit: Payer: Self-pay | Admitting: Family Medicine

## 2017-11-11 DIAGNOSIS — I10 Essential (primary) hypertension: Secondary | ICD-10-CM

## 2017-11-25 DIAGNOSIS — Z13820 Encounter for screening for osteoporosis: Secondary | ICD-10-CM | POA: Diagnosis not present

## 2017-12-01 ENCOUNTER — Other Ambulatory Visit: Payer: Self-pay | Admitting: *Deleted

## 2017-12-01 DIAGNOSIS — I10 Essential (primary) hypertension: Secondary | ICD-10-CM

## 2017-12-01 MED ORDER — METOPROLOL TARTRATE 50 MG PO TABS: 25.0000 mg | ORAL_TABLET | Freq: Two times a day (BID) | ORAL | 0 refills | Status: DC

## 2017-12-04 ENCOUNTER — Encounter: Payer: Self-pay | Admitting: *Deleted

## 2017-12-04 ENCOUNTER — Other Ambulatory Visit: Payer: Self-pay | Admitting: *Deleted

## 2017-12-04 DIAGNOSIS — E785 Hyperlipidemia, unspecified: Secondary | ICD-10-CM

## 2017-12-04 MED ORDER — EZETIMIBE 10 MG PO TABS: 10.0000 mg | ORAL_TABLET | Freq: Every day | ORAL | 0 refills | Status: DC

## 2017-12-05 ENCOUNTER — Ambulatory Visit: Payer: Medicare Other | Admitting: *Deleted

## 2017-12-05 DIAGNOSIS — I4891 Unspecified atrial fibrillation: Secondary | ICD-10-CM | POA: Diagnosis not present

## 2017-12-05 DIAGNOSIS — Z7901 Long term (current) use of anticoagulants: Secondary | ICD-10-CM

## 2017-12-05 LAB — POCT INR: INR: 2.7 (ref 2.0–3.0)

## 2017-12-05 NOTE — Patient Instructions (Signed)
Description   Continue taking your  same dosage 1 tablet every day except 1.5 tablets on Sundays.   Recheck INR in 4 weeks. # [phone] Coumadin clinic. Main # [phone].

## 2017-12-09 ENCOUNTER — Other Ambulatory Visit: Payer: Self-pay | Admitting: Internal Medicine

## 2017-12-31 ENCOUNTER — Other Ambulatory Visit: Payer: Self-pay | Admitting: Family Medicine

## 2018-01-02 ENCOUNTER — Ambulatory Visit: Payer: Medicare Other | Admitting: *Deleted

## 2018-01-02 DIAGNOSIS — I4891 Unspecified atrial fibrillation: Secondary | ICD-10-CM | POA: Diagnosis not present

## 2018-01-02 DIAGNOSIS — Z7901 Long term (current) use of anticoagulants: Secondary | ICD-10-CM | POA: Diagnosis not present

## 2018-01-02 LAB — POCT INR: INR: 3.5 — AB (ref 2.0–3.0)

## 2018-01-02 NOTE — Patient Instructions (Signed)
Description   Do not take any Coumadin today then continue taking your same dosage 1 tablet every day except 1.5 tablets on Sundays.   Recheck INR in 3 weeks. # [phone] Coumadin clinic. Main # [phone].

## 2018-01-12 ENCOUNTER — Other Ambulatory Visit: Payer: Self-pay

## 2018-01-12 ENCOUNTER — Ambulatory Visit (INDEPENDENT_AMBULATORY_CARE_PROVIDER_SITE_OTHER): Payer: Medicare Other | Admitting: Family Medicine

## 2018-01-12 ENCOUNTER — Encounter: Payer: Self-pay | Admitting: Family Medicine

## 2018-01-12 VITALS — BP 119/78 | HR 70 | Resp 16 | Ht 65.0 in | Wt 199.0 lb

## 2018-01-12 DIAGNOSIS — F331 Major depressive disorder, recurrent, moderate: Secondary | ICD-10-CM

## 2018-01-12 DIAGNOSIS — M8580 Other specified disorders of bone density and structure, unspecified site: Secondary | ICD-10-CM

## 2018-01-12 DIAGNOSIS — E785 Hyperlipidemia, unspecified: Secondary | ICD-10-CM

## 2018-01-12 DIAGNOSIS — I4891 Unspecified atrial fibrillation: Secondary | ICD-10-CM

## 2018-01-12 DIAGNOSIS — G4700 Insomnia, unspecified: Secondary | ICD-10-CM

## 2018-01-12 DIAGNOSIS — I700 Atherosclerosis of aorta: Secondary | ICD-10-CM

## 2018-01-12 DIAGNOSIS — Z23 Encounter for immunization: Secondary | ICD-10-CM

## 2018-01-12 DIAGNOSIS — I10 Essential (primary) hypertension: Secondary | ICD-10-CM | POA: Diagnosis not present

## 2018-01-12 DIAGNOSIS — E559 Vitamin D deficiency, unspecified: Secondary | ICD-10-CM

## 2018-01-12 LAB — LIPID PANEL
CHOLESTEROL: 182 mg/dL (ref 0–200)
HDL: 49.9 mg/dL (ref >39.00)
LDL Cholesterol: 97 mg/dL (ref 0–99)
NonHDL: 132.02
TRIGLYCERIDES: 176 mg/dL — AB (ref 0.0–149.0)
Total CHOL/HDL Ratio: 4
VLDL: 35.2 mg/dL (ref 0.0–40.0)

## 2018-01-12 MED ORDER — EZETIMIBE 10 MG PO TABS: 10.0000 mg | ORAL_TABLET | Freq: Every day | ORAL | 1 refills | Status: DC

## 2018-01-12 MED ORDER — BENAZEPRIL HCL 20 MG PO TABS: 20.0000 mg | ORAL_TABLET | Freq: Every day | ORAL | 1 refills | Status: DC

## 2018-01-12 MED ORDER — AMLODIPINE BESYLATE 10 MG PO TABS: 10.0000 mg | ORAL_TABLET | Freq: Every day | ORAL | 1 refills | Status: DC

## 2018-01-12 MED ORDER — EZETIMIBE 10 MG PO TABS: 10.0000 mg | ORAL_TABLET | Freq: Every day | ORAL | 3 refills | Status: DC

## 2018-01-12 MED ORDER — ESCITALOPRAM OXALATE 20 MG PO TABS: 20.0000 mg | ORAL_TABLET | Freq: Every day | ORAL | 0 refills | Status: DC

## 2018-01-12 MED ORDER — TRAZODONE HCL 100 MG PO TABS: 100.0000 mg | ORAL_TABLET | Freq: Every day | ORAL | 0 refills | Status: DC

## 2018-01-12 MED ORDER — METOPROLOL TARTRATE 50 MG PO TABS: 25.0000 mg | ORAL_TABLET | Freq: Two times a day (BID) | ORAL | 1 refills | Status: DC

## 2018-01-12 MED ORDER — FUROSEMIDE 20 MG PO TABS: ORAL_TABLET | ORAL | 3 refills | Status: DC

## 2018-01-12 NOTE — Progress Notes (Signed)
Jennifer House , [birth year], 72 y.o., female MRN: [id number] Patient Care Team    Relationship Specialty Notifications Start End  Ma Hillock, DO PCP - General Family Medicine  01/23/15   Thompson Grayer, MD Attending Physician Cardiology  07/25/11   Ronald Lobo, MD Consulting Physician Gastroenterology  08/25/15   Rana Snare, MD Consulting Physician Urology  08/25/15   Renelda Loma, OD  Optometry  08/25/15    Comment: "kimberly" at [facility]    Chief Complaint  Patient presents with  . Follow-up    cholesterol medication    Subjective: Pt presents for an OV for follow-up chronic medical conditions.   Hypertension/hyperlipidemia/hypertriglyceridemia/A. fib: Pt reports compliance with Benzapril 20 mg, amlodipine 10 mg, metoprolol 25 mg twice a day, Lasix 20 mg (MWF) today. Blood pressures ranges at home within normal limits. Patient denies chest pain, shortness of breath, dizziness or lower extremity edema.   Pt is on warfarin therapy per cardiology. Pt is prescribed Zetia BMP: 08/30/2017 (low calcium) CBC: 09/02/2017 Mild anemia, otherwise within normal limits Lipids: 09/19/2016 total cholesterol 224, HDL 55, LDL 135, triglycerides 163 TSH 08/25/2017: WNL Diet: Low-sodium Exercise: Tries to exercise  RF: Hypertension, hyperlipidemia, family history of heart disease and stroke, personal history A. Fib, obesity, former smoker  Hypocalcemia: last two collections 08/2017 with mild hypocalcemia. She has known vit d def and osteopenia.   Depression/insomnia: doing well on trazodone and lexapro.   Depression screen [facility] 2/9 01/12/2018 09/09/2017 04/30/2017 03/26/2017 09/19/2016  Decreased Interest 0 0 0 0 0  Down, Depressed, Hopeless 0 0 0 0 0  PHQ - 2 Score 0 0 0 0 0  Altered sleeping 0 - - 0 -  Tired, decreased energy 1 - - 3 -  Change in appetite 0 - - 0 -  Feeling bad or failure about yourself  0 - - 0 -  Trouble concentrating 1 - - 0 -  Moving slowly or fidgety/restless 0 - - 3  -  Suicidal thoughts 0 - - 0 -  PHQ-9 Score 2 - - 6 -  Difficult doing work/chores Not difficult at all - - Not difficult at all -    Allergies  Allergen Reactions  . Ibandronate Sodium Other (See Comments)    Bones hurt  . Pravachol [Pravastatin Sodium] Other (See Comments)    Myalgias   . Ultram [Tramadol Hcl] Nausea Only   Social History   Tobacco Use  . Smoking status: Former Smoker    Last attempt to quit: 03/18/1985    Years since quitting: 32.8  . Smokeless tobacco: Never Used  . Tobacco comment: denies   Substance Use Topics  . Alcohol use: No   Past Medical History:  Diagnosis Date  . Anxiety   . Atrial fibrillation ([place])    longstanding persistent  . 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 Risa Grill  . Colon polyps   . Depression   . GERD (gastroesophageal reflux disease)   . Headache(784.0)    hx of migraines  . Hematuria    hx  . HLD (hyperlipidemia)    mixed  . HTN (hypertension)   . Hypothyroidism   . Insomnia   . Liver function test abnormality    hx fatty liver  . Murmur, cardiac   . Nonalcoholic fatty liver disease   . Obesity   . Osteoarthritis    low back pain with left sciatica  . Osteopenia    dexa  2013  . Renal calculus    hx  . Restless leg syndrome    Past Surgical History:  Procedure Laterality Date  . APPENDECTOMY    . CARDIAC CATHETERIZATION  1/12   revealed normal cors and LV function  . COLONOSCOPY    . cystocopy  10/17/03   left stent placement, TURBT  . FOOT SURGERY    . LUMBAR LAMINECTOMY/DECOMPRESSION MICRODISCECTOMY N/A 01/19/2013   Procedure: LUMBAR THREE FOUR LUMBAR LAMINECTOMY/DECOMPRESSION MICRODISCECTOMY 1 LEVEL;  Surgeon: Faythe Ghee, MD;  Location: [facility];  Service: Neurosurgery;  Laterality: N/A;  . TOTAL ABDOMINAL HYSTERECTOMY  1985  . TOTAL HIP ARTHROPLASTY Left 08/28/2017   Procedure: TOTAL HIP ARTHROPLASTY ANTERIOR APPROACH;  Surgeon: Rod Can, MD;  Location: [facility];   Service: Orthopedics;  Laterality: Left;   Family History  Problem Relation Age of Onset  . Heart failure Mother   . Heart attack Mother   . Hypertension Mother   . Thyroid cancer Mother   . Stroke Father   . Hypertension Father   . Stroke Brother   . Breast cancer Neg Hx   . Colon cancer Neg Hx    Allergies as of 01/12/2018      Reactions   Ibandronate Sodium Other (See Comments)   Bones hurt   Pravachol [pravastatin Sodium] Other (See Comments)   Myalgias   Ultram [tramadol Hcl] Nausea Only      Medication List        Accurate as of 01/12/18  1:10 PM. Always use your most recent med list.          amLODipine 10 MG tablet Commonly known as:  NORVASC Take 1 tablet (10 mg total) by mouth daily.   benazepril 20 MG tablet Commonly known as:  LOTENSIN Take 1 tablet (20 mg total) by mouth daily. Needs office visit prior to anymore refills.   CALTRATE 600 1500 (600 Ca) MG Tabs tablet Generic drug:  calcium carbonate Take 3 tablets by mouth daily.   CENTRUM SILVER tablet Take 1 tablet by mouth daily.   cholecalciferol 1000 units tablet Commonly known as:  VITAMIN D Take 1,000 Units by mouth daily.   escitalopram 20 MG tablet Commonly known as:  LEXAPRO Take 1 tablet (20 mg total) by mouth daily.   ezetimibe 10 MG tablet Commonly known as:  ZETIA Take 1 tablet (10 mg total) by mouth daily. Needs office visit prior to any additional refills.   furosemide 20 MG tablet Commonly known as:  LASIX TAKE 1 TABLET BY MOUTH EVERY MONDAY, WEDNESDAY AND FRIDAY   levothyroxine 75 MCG tablet Commonly known as:  SYNTHROID, LEVOTHROID TAKE 1 TABLET DAILY   metoprolol tartrate 50 MG tablet Commonly known as:  LOPRESSOR Take 0.5 tablets (25 mg total) by mouth 2 (two) times daily.   nitroGLYCERIN 0.4 MG SL tablet Commonly known as:  NITROSTAT PLACE 1 TABLET UNDER THE TONGUE EVERY 5 MINUTES AS NEEDED FOR CHEST PAIN,(MAX 3 TABLETS)   polyethylene glycol packet Commonly  known as:  MIRALAX / GLYCOLAX Take 17 g by mouth daily as needed for mild constipation or moderate constipation.   senna-docusate 8.6-50 MG tablet Commonly known as:  Senokot-S Take 2 tablets by mouth at bedtime.   traZODone 100 MG tablet Commonly known as:  DESYREL Take 1 tablet (100 mg total) by mouth at bedtime.   warfarin 5 MG tablet Commonly known as:  COUMADIN Take as directed by the anticoagulation clinic. If you are unsure how to  take this medication, talk to your nurse or doctor. Original instructions:  TAKE AS DIRECTED BY COUMADIN CLINIC       No results found for this or any previous visit (from the past 24 hour(s)). No results found.   ROS: Negative, with the exception of above mentioned in HPI   Objective:  BP 119/78 (BP Location: Left Arm, Patient Position: Sitting, Cuff Size: Normal)   Pulse 70   Resp 16   Ht 5\' 5"  (1.651 m)   Wt 199 lb (90.3 kg)   SpO2 95%   BMI 33.12 kg/m  Body mass index is 33.12 kg/m. Gen: Afebrile. No acute distress. Nontoxic in presentation.  HENT: AT. [place].  MMM.  Eyes:Pupils Equal Round Reactive to light, Extraocular movements intact,  Conjunctiva without redness, discharge or icterus. Neck/lymp/endocrine: Supple,no lymphadenopathy, no thyromegaly CV: RRR. No edema, +2/4 P posterior tibialis pulses Chest: CTAB, no wheeze or crackles Abd: Soft. NTND. BS present. no Masses palpated.  Skin: no rashes, purpura or petechiae.  Neuro:  Normal gait. PERLA. EOMi. Alert. Oriented. Psych: Normal affect, dress and demeanor. Normal speech. Normal thought content and judgment.   Assessment/Plan: DAMYA COMLEY is a 72 y.o. female present for  OV for  Essential hypertension, benign/hyperlipidemia/A. Fib/morbid obesity/Atrial fibrillation, unspecified type (HCC)/Aortic atherosclerosis (HCC) - BP stable. Continue current regimen. - Continue follow-up with cardiology for atrial fibrillation - low salt diet, increase exercise.  - Continue  Coumadin per Coumadin Clinic - Refills provided on metoprolol, Benzapril, amlodipine, Lasix and Zetia for 6 months. - ezetimibe (ZETIA) 10 MG tablet; Take 1 tablet (10 mg total) by mouth daily. Needs office visit prior to any additional refills.  Dispense: 90 tablet; Refill: 3 - Lipid panel - metoprolol tartrate (LOPRESSOR) 50 MG tablet; Take 0.5 tablets (25 mg total) by mouth 2 (two) times daily.  Dispense: 180 tablet; Refill: 1 - benazepril (LOTENSIN) 20 MG tablet; Take 1 tablet (20 mg total) by mouth daily. Needs office visit prior to anymore refills.  Dispense: 90 tablet; Refill: 1 - amLODipine (NORVASC) 10 MG tablet; Take 1 tablet (10 mg total) by mouth daily.  Dispense: 90 tablet; Refill: 1 - furosemide (LASIX) 20 MG tablet; TAKE 1 TABLET BY MOUTH EVERY MONDAY, WEDNESDAY AND FRIDAY  Dispense: 45 tablet; Refill: 3  Immunization due - Flu vaccine HIGH DOSE PF  Hypocalcemia/Osteopenia, unspecified location - takes vit d 1000u daily. Last dexa 2013 - PTH, Intact and Calcium - Vitamin D (25 hydroxy)  Insomnia, unspecified type/ Moderate episode of recurrent major depressive disorder (HCC) - stable.  - traZODone (DESYREL) 100 MG tablet; Take 1 tablet (100 mg total) by mouth at bedtime.  Dispense: 90 tablet; Refill: 0 - escitalopram (LEXAPRO) 20 MG tablet; Take 1 tablet (20 mg total) by mouth daily.  Dispense: 90 tablet; Refill: 0  electronically signed by:  Howard Pouch, DO  [place]

## 2018-01-12 NOTE — Telephone Encounter (Signed)
Patient has appt scheduled for 03/13/18.

## 2018-01-12 NOTE — Patient Instructions (Signed)
F/u 6 mos. On [place]  Hypocalcemia, Adult Hypocalcemia is when the level of calcium in a person's blood is below normal. Calcium is a mineral that is used by the body in many ways. A lack of blood calcium can affect the heart and muscles, make the bones more likely to break, and cause other problems. What are the causes? This condition may be caused by:  Decreased production (hypoparathyroidism) or improper use of parathyroid hormone.  Problems with the parathyroid glands or surgical removal of these glands.  Problems with parathyroid function after removal of the thyroid gland.  Lack (deficiency) of vitamin D or magnesium or both.  Kidney problems.  Less common causes include:  Intestinal problems that interfere with nutrient absorption.  Alcoholism.  Low levels of a body protein that is called albumin.  Inflammation of the pancreas (pancreatitis).  Certain medicines.  Severe infections (sepsis).  Certain diseases, such as sarcoidosis or hemochromatosis, that cause the parathyroid glands to be filled with cells or substances that are not normally present.  Breakdown of large amounts of muscle fiber.  High levels of phosphate in the body.  Cancer.  Massive blood transfusions, which usually occur with severe trauma.  What are the signs or symptoms? Symptoms of this condition include:  Numbness and tingling in the fingers, toes, or around the mouth.  Muscle aches or cramps, especially in the legs, feet, and back.  Muscle twitches.  Abdominal cramping or pain.  Memory problems, confusion, or difficulty thinking.  Depression, anxiety, irritability, or changes in personality.  Fainting.  Chest pain.  Difficulty swallowing.  Changes in the sound of the voice.  Shortness of breath or wheezing.  General weakness and fatigue.  Symptoms of severe hypocalcemia include:  Shaking uncontrollably (seizures).  Seizure of the voice box (laryngospasm).  Fast  heartbeats (palpitations) and abnormal heart rhythms (arrhythmias).  Long-term symptoms of this condition include:  Coarse, brittle hair and nails.  Dry skin or lasting (chronic) skin diseases (psoriasis, eczema,, or dermatitis).  Clouding of the eye lens (cataracts).  How is this diagnosed? This condition is usually diagnosed with a blood test. You may also have other tests to help determine the underlying cause of the condition. For example, a test may be done that records the electrical activity of the heart (electrocardiogram,or ECG). How is this treated? Treatment for this condition may include:  Calcium given by mouth (orally) or given through an IV tube that is inserted into one of your veins. The method used for giving calcium will depend on the severity of the condition.  Other minerals (electrolytes), such as magnesium.  Other treatment will depend on the cause of the condition. Follow these instructions at home:  Follow diet instructions from your health care provider or dietitian.  Take supplements only as told by your health care provider.  Keep all follow-up visits as told by your health care provider. This is important. Contact a health care provider if:  You have increased fatigue.  You have increased muscle twitching.  You have new swelling in the feet, ankles, or legs.  You develop changes in mood, memory, or personality. Get help right away if:  You have chest pain.  You have persistent rapid or irregular heartbeats.  You have difficulty breathing.  You faint.  You start to have seizures.  You have confusion. This information is not intended to replace advice given to you by your health care provider. Make sure you discuss any questions you have with your health  care provider. Document Released: 08/22/2009 Document Revised: 08/10/2015 Document Reviewed: 07/20/2014 Elsevier Interactive Patient Education  [employer].

## 2018-01-13 ENCOUNTER — Telehealth: Payer: Self-pay | Admitting: Family Medicine

## 2018-01-13 LAB — VITAMIN D 25 HYDROXY (VIT D DEFICIENCY, FRACTURES): VIT D 25 HYDROXY: 39 ng/mL (ref 30–100)

## 2018-01-13 LAB — PTH, INTACT AND CALCIUM
CALCIUM: 9.7 mg/dL (ref 8.6–10.4)
PTH: 71 pg/mL — ABNORMAL HIGH (ref 14–64)

## 2018-01-13 NOTE — Telephone Encounter (Signed)
Please inform patient the following information: Her vit d is normal. Her calcium is now normal. Her parathyroid hormone is mildly elevated, but just mildly. Her cholesterol panel looks good, except her triglycerides are just mildly elevated ay 176 ( NL < 150) - trig: if able to tolerate Omega 3 1000 mg daily would be helpful. Diet higher in fiber and lower in saturated fats is also helpful.  We will continue to monitor her parathyroid hormone at  Her follow in 6 mos-- to ensure it is not rising. Since her calcium is now normal and the PTH is just barely abnormal we will just monitor for now.

## 2018-01-14 NOTE — Telephone Encounter (Signed)
Left detailed message with results and instructions on patient voice mail per DPR 

## 2018-01-23 ENCOUNTER — Ambulatory Visit: Payer: Medicare Other | Admitting: *Deleted

## 2018-01-23 DIAGNOSIS — I4891 Unspecified atrial fibrillation: Secondary | ICD-10-CM | POA: Diagnosis not present

## 2018-01-23 DIAGNOSIS — Z7901 Long term (current) use of anticoagulants: Secondary | ICD-10-CM

## 2018-01-23 LAB — POCT INR: INR: 3.4 — AB (ref 2.0–3.0)

## 2018-01-23 NOTE — Patient Instructions (Signed)
Description   Do not take any Coumadin today then start taking 1 tablet everyday. Recheck INR in 3 weeks per tp request. # [phone] Coumadin clinic. Main # [phone].

## 2018-02-11 ENCOUNTER — Ambulatory Visit: Payer: Medicare Other

## 2018-02-11 DIAGNOSIS — Z7901 Long term (current) use of anticoagulants: Secondary | ICD-10-CM | POA: Diagnosis not present

## 2018-02-11 DIAGNOSIS — I4891 Unspecified atrial fibrillation: Secondary | ICD-10-CM

## 2018-02-11 LAB — POCT INR: INR: 2.6 (ref 2.0–3.0)

## 2018-02-11 NOTE — Patient Instructions (Signed)
Description   Continue on same dosage 1 tablet everyday. Recheck INR in 4 weeks. # [phone] Coumadin clinic. Main # [phone].

## 2018-03-02 DIAGNOSIS — S72032D Displaced midcervical fracture of left femur, subsequent encounter for closed fracture with routine healing: Secondary | ICD-10-CM | POA: Diagnosis not present

## 2018-03-13 ENCOUNTER — Ambulatory Visit: Payer: Medicare Other | Admitting: Family Medicine

## 2018-03-16 ENCOUNTER — Ambulatory Visit: Payer: Medicare Other | Admitting: *Deleted

## 2018-03-16 DIAGNOSIS — Z7901 Long term (current) use of anticoagulants: Secondary | ICD-10-CM

## 2018-03-16 DIAGNOSIS — I4891 Unspecified atrial fibrillation: Secondary | ICD-10-CM | POA: Diagnosis not present

## 2018-03-16 LAB — POCT INR: INR: 2 (ref 2.0–3.0)

## 2018-03-16 NOTE — Patient Instructions (Signed)
Description   Today take 1.5 tablets then continue on same dosage 1 tablet everyday. Recheck INR in 4 weeks. # [phone] Coumadin clinic. Main # [phone].

## 2018-03-31 ENCOUNTER — Other Ambulatory Visit: Payer: Self-pay

## 2018-04-13 ENCOUNTER — Ambulatory Visit: Payer: Medicare Other | Admitting: *Deleted

## 2018-04-13 DIAGNOSIS — Z7901 Long term (current) use of anticoagulants: Secondary | ICD-10-CM

## 2018-04-13 DIAGNOSIS — I4891 Unspecified atrial fibrillation: Secondary | ICD-10-CM

## 2018-04-13 LAB — POCT INR: INR: 2.7 (ref 2.0–3.0)

## 2018-04-13 NOTE — Patient Instructions (Signed)
Description   °Continue on same dosage 1 tablet every day. Recheck INR in 6 weeks. # [phone] Coumadin clinic. Main # [phone].  °  ° °

## 2018-04-14 ENCOUNTER — Other Ambulatory Visit: Payer: Self-pay | Admitting: Internal Medicine

## 2018-05-25 ENCOUNTER — Ambulatory Visit: Payer: Medicare Other | Admitting: Pharmacist

## 2018-05-25 DIAGNOSIS — I4891 Unspecified atrial fibrillation: Secondary | ICD-10-CM | POA: Diagnosis not present

## 2018-05-25 DIAGNOSIS — Z7901 Long term (current) use of anticoagulants: Secondary | ICD-10-CM

## 2018-05-25 LAB — POCT INR: INR: 2.2 (ref 2.0–3.0)

## 2018-05-25 NOTE — Patient Instructions (Signed)
Description   °Continue on same dosage 1 tablet every day. Recheck INR in 6 weeks. # [phone] Coumadin clinic. Main # [phone].  °  ° °

## 2018-06-01 ENCOUNTER — Other Ambulatory Visit: Payer: Self-pay | Admitting: Family Medicine

## 2018-06-01 DIAGNOSIS — I10 Essential (primary) hypertension: Secondary | ICD-10-CM

## 2018-06-01 DIAGNOSIS — F331 Major depressive disorder, recurrent, moderate: Secondary | ICD-10-CM

## 2018-06-01 NOTE — Telephone Encounter (Signed)
Copied from [place] [phone]. Topic: Quick Communication - Rx Refill/Question >> Jun 01, 2018  8:09 AM Leward Quan A wrote: Medication: escitalopram (LEXAPRO) 20 MG tablet, benazepril (LOTENSIN) 20 MG tablet   Patient is all out of both these medication need Rx today please. Has appointment for tomorrow  Has the patient contacted their pharmacy? Yes.   (Agent: If no, request that the patient contact the pharmacy for the refill.) (Agent: If yes, when and what did the pharmacy advise?)  Preferred Pharmacy (with phone number or street name): [facility], [place] - [address] [place] [address] [place] [address] [place] [address] [phone] (Phone) [phone] (Fax)    Agent: Please be advised that RX refills may take up to 3 business days. We ask that you follow-up with your pharmacy.

## 2018-06-01 NOTE — Telephone Encounter (Signed)
Pt was called and asked if she had enough medication to last until appt tomorrow, pt stated she did and it would be fine to call in medication tomorrow at appt.

## 2018-06-02 ENCOUNTER — Other Ambulatory Visit: Payer: Self-pay

## 2018-06-02 ENCOUNTER — Ambulatory Visit (INDEPENDENT_AMBULATORY_CARE_PROVIDER_SITE_OTHER): Payer: Medicare Other | Admitting: Family Medicine

## 2018-06-02 ENCOUNTER — Encounter: Payer: Self-pay | Admitting: Family Medicine

## 2018-06-02 VITALS — BP 150/84 | HR 66 | Temp 97.8°F | Resp 16 | Ht 65.0 in | Wt 204.1 lb

## 2018-06-02 DIAGNOSIS — G4700 Insomnia, unspecified: Secondary | ICD-10-CM

## 2018-06-02 DIAGNOSIS — I10 Essential (primary) hypertension: Secondary | ICD-10-CM | POA: Diagnosis not present

## 2018-06-02 DIAGNOSIS — F331 Major depressive disorder, recurrent, moderate: Secondary | ICD-10-CM

## 2018-06-02 MED ORDER — TRAZODONE HCL 100 MG PO TABS: 100.0000 mg | ORAL_TABLET | Freq: Every day | ORAL | 1 refills | Status: DC

## 2018-06-02 MED ORDER — AMLODIPINE BESYLATE 10 MG PO TABS: 10.0000 mg | ORAL_TABLET | Freq: Every day | ORAL | 1 refills | Status: DC

## 2018-06-02 MED ORDER — BENAZEPRIL HCL 20 MG PO TABS: 30.0000 mg | ORAL_TABLET | Freq: Every day | ORAL | 1 refills | Status: DC

## 2018-06-02 MED ORDER — METOPROLOL TARTRATE 50 MG PO TABS: 25.0000 mg | ORAL_TABLET | Freq: Two times a day (BID) | ORAL | 1 refills | Status: DC

## 2018-06-02 MED ORDER — ESCITALOPRAM OXALATE 20 MG PO TABS: 20.0000 mg | ORAL_TABLET | Freq: Every day | ORAL | 1 refills | Status: DC

## 2018-06-02 NOTE — Progress Notes (Signed)
DOMINIQUE RESSEL , Mar 04, 1946, 73 y.o., female MRN: [id number] Patient Care Team    Relationship Specialty Notifications Start End  Ma Hillock, DO PCP - General Family Medicine  01/23/15   Thompson Grayer, MD Attending Physician Cardiology  07/25/11   Ronald Lobo, MD Consulting Physician Gastroenterology  08/25/15   Rana Snare, MD Consulting Physician Urology  08/25/15   Renelda Loma, OD  Optometry  08/25/15    Comment: "kimberly" at [facility]    Chief Complaint  Patient presents with  . Hypertension    Not fasting. No complaints. Needs refills on medications. Pt took BP meds around 9am. Pt takes BP at home every once in a while and she states it runs "good"  . Depression    Subjective: Pt presents for an OV for follow-up chronic medical conditions.   Hypertension/hyperlipidemia/hypertriglyceridemia/A. fib: Pt reports appliance with Benzapril 20 mg, amlodipine 10 mg, metoprolol 25 mg twice a day, Lasix 20 mg (MWF) today. Blood pressures ranges at home not routinely checked.Patient denies chest pain, shortness of breath, dizziness or lower extremity edema.  Pt is on warfarin therapy per cardiology. Pt is prescribed Zetia BMP: 08/30/2017 CBC: 09/02/2017 Mild anemia, otherwise within normal limits Lipids: 12/2017 WNL- tr 176 TSH 08/25/2017: WNL PTH/CA- 01/12/2018--> mild elevation in PTH- calcium now normal.  Vit d 09/2016: 45 Diet: Low-sodium Exercise: Tries to exercise  RF: Hypertension, hyperlipidemia, family history of heart disease and stroke, personal history A. Fib, obesity, former smoker  Depression/insomnia: doing well  on trazodone and lexapro. She needs refills today- no complaints.   Depression screen [facility] 2/9 06/02/2018 01/12/2018 09/09/2017 04/30/2017 03/26/2017  Decreased Interest 0 0 0 0 0  Down, Depressed, Hopeless 0 0 0 0 0  PHQ - 2 Score 0 0 0 0 0  Altered sleeping 0 0 - - 0  Tired, decreased energy 0 1 - - 3  Change in appetite 0 0 - - 0  Feeling bad or failure  about yourself  0 0 - - 0  Trouble concentrating 0 1 - - 0  Moving slowly or fidgety/restless 0 0 - - 3  Suicidal thoughts 0 0 - - 0  PHQ-9 Score 0 2 - - 6  Difficult doing work/chores Not difficult at all Not difficult at all - - Not difficult at all    Allergies  Allergen Reactions  . Ibandronate Sodium Other (See Comments)    Bones hurt  . Pravachol [Pravastatin Sodium] Other (See Comments)    Myalgias   . Ultram [Tramadol Hcl] Nausea Only   Social History   Tobacco Use  . Smoking status: Former Smoker    Last attempt to quit: 03/18/1985    Years since quitting: 33.2  . Smokeless tobacco: Never Used  . Tobacco comment: denies   Substance Use Topics  . Alcohol use: No   Past Medical History:  Diagnosis Date  . Anxiety   . Atrial fibrillation ([place])    longstanding persistent  . 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 Risa Grill  . Colon polyps   . Depression   . GERD (gastroesophageal reflux disease)   . Headache(784.0)    hx of migraines  . Hematuria    hx  . HLD (hyperlipidemia)    mixed  . HTN (hypertension)   . Hypothyroidism   . Insomnia   . Liver function test abnormality    hx fatty liver  . Murmur, cardiac   . Nonalcoholic  fatty liver disease   . Obesity   . Osteoarthritis    low back pain with left sciatica  . Osteopenia    dexa 2013  . Renal calculus    hx  . Restless leg syndrome    Past Surgical History:  Procedure Laterality Date  . APPENDECTOMY    . CARDIAC CATHETERIZATION  1/12   revealed normal cors and LV function  . COLONOSCOPY    . cystocopy  10/17/03   left stent placement, TURBT  . FOOT SURGERY    . LUMBAR LAMINECTOMY/DECOMPRESSION MICRODISCECTOMY N/A 01/19/2013   Procedure: LUMBAR THREE FOUR LUMBAR LAMINECTOMY/DECOMPRESSION MICRODISCECTOMY 1 LEVEL;  Surgeon: Faythe Ghee, MD;  Location: [facility];  Service: Neurosurgery;  Laterality: N/A;  . TOTAL ABDOMINAL HYSTERECTOMY  1985  . TOTAL HIP ARTHROPLASTY Left  08/28/2017   Procedure: TOTAL HIP ARTHROPLASTY ANTERIOR APPROACH;  Surgeon: Rod Can, MD;  Location: [facility];  Service: Orthopedics;  Laterality: Left;   Family History  Problem Relation Age of Onset  . Heart failure Mother   . Heart attack Mother   . Hypertension Mother   . Thyroid cancer Mother   . Stroke Father   . Hypertension Father   . Stroke Brother   . Breast cancer Neg Hx   . Colon cancer Neg Hx    Allergies as of 06/02/2018      Reactions   Ibandronate Sodium Other (See Comments)   Bones hurt   Pravachol [pravastatin Sodium] Other (See Comments)   Myalgias   Ultram [tramadol Hcl] Nausea Only      Medication List       Accurate as of June 02, 2018  5:24 PM. Always use your most recent med list.        amLODipine 10 MG tablet Commonly known as:  NORVASC Take 1 tablet (10 mg total) by mouth daily.   benazepril 20 MG tablet Commonly known as:  LOTENSIN Take 1.5 tablets (30 mg total) by mouth daily. Needs office visit prior to anymore refills.   Caltrate 600 1500 (600 Ca) MG Tabs tablet Generic drug:  calcium carbonate Take 3 tablets by mouth daily.   Centrum Silver tablet Take 1 tablet by mouth daily.   cholecalciferol 1000 units tablet Commonly known as:  VITAMIN D Take 1,000 Units by mouth daily.   escitalopram 20 MG tablet Commonly known as:  Lexapro Take 1 tablet (20 mg total) by mouth daily.   ezetimibe 10 MG tablet Commonly known as:  ZETIA Take 1 tablet (10 mg total) by mouth daily. Needs office visit prior to any additional refills.   furosemide 20 MG tablet Commonly known as:  LASIX TAKE 1 TABLET BY MOUTH EVERY MONDAY, WEDNESDAY AND FRIDAY   levothyroxine 75 MCG tablet Commonly known as:  SYNTHROID, LEVOTHROID TAKE 1 TABLET DAILY   metoprolol tartrate 50 MG tablet Commonly known as:  LOPRESSOR Take 0.5 tablets (25 mg total) by mouth 2 (two) times daily.   nitroGLYCERIN 0.4 MG SL tablet Commonly known as:  NITROSTAT PLACE 1  TABLET UNDER THE TONGUE EVERY 5 MINUTES AS NEEDED FOR CHEST PAIN,(MAX 3 TABLETS)   polyethylene glycol packet Commonly known as:  MIRALAX / GLYCOLAX Take 17 g by mouth daily as needed for mild constipation or moderate constipation.   senna-docusate 8.6-50 MG tablet Commonly known as:  Senokot S Take 2 tablets by mouth at bedtime.   traZODone 100 MG tablet Commonly known as:  DESYREL Take 1 tablet (100 mg total) by  mouth at bedtime.   warfarin 5 MG tablet Commonly known as:  COUMADIN Take as directed by the anticoagulation clinic. If you are unsure how to take this medication, talk to your nurse or doctor. Original instructions:  TAKE AS DIRECTED BY COUMADIN CLINIC       No results found for this or any previous visit (from the past 24 hour(s)). No results found.   ROS: Negative, with the exception of above mentioned in HPI   Objective:  BP (!) 150/84 (BP Location: Right Arm, Patient Position: Sitting, Cuff Size: Large)   Pulse 66   Temp 97.8 F (36.6 C) (Oral)   Resp 16   Ht 5\' 5"  (1.651 m)   Wt 204 lb 2 oz (92.6 kg)   SpO2 95%   BMI 33.97 kg/m  Body mass index is 33.97 kg/m. Gen: Afebrile. No acute distress.  Nontoxic in presentation, well-developed, well-nourished, obese Caucasian female. HENT: AT. [place].  MMM.  Eyes:Pupils Equal Round Reactive to light, Extraocular movements intact,  Conjunctiva without redness, discharge or icterus. Neck/lymp/endocrine: Supple,no lymphadenopathy, no thyromegaly CV: RRR no murmur, no edema, +2/4 P posterior tibialis pulses Chest: CTAB, no wheeze or crackles Abd: Soft. NTND. BS present. no Masses palpated.  Skin: no rashes, purpura or petechiae.  Neuro:  Normal gait. PERLA. EOMi. Alert. Oriented x3  Psych: Normal affect, dress and demeanor. Normal speech. Normal thought content and judgment..   Assessment/Plan: ANNEBELLE BOSTIC is a 73 y.o. female present for  OV for  Essential hypertension, benign/hyperlipidemia/A. Fib/morbid  obesity/Atrial fibrillation, unspecified type (HCC)/Aortic atherosclerosis (HCC) - BP above goal today on repeat x2. -Increase Benzapril to 30 mg daily, continue amlodipine, Lasix, metoprolol and Zetia at its current doses.  Refills provided on these meds today. - Continue follow-up with cardiology for atrial fibrillation - low salt diet, increase exercise.  - Continue Coumadin per Coumadin Clinic -Follow up 6 months.-->  With fasting labs/provider  appointment  Hypocalcemia/Osteopenia, unspecified location - takes vit d 1000u daily. Last dexa 2013 - PTH, Intact and Calcium, vit d every 6 months.  Last collected 01/12/2018 with normal calcium and mildly elevated PTH of 71. Insomnia, unspecified type/ Moderate episode of recurrent major depressive disorder (HCC) Stable - traZODone (DESYREL) 100 MG tablet; Take 1 tablet (100 mg total) by mouth at bedtime.  Dispense: 90 tablet; Refill: 0 - escitalopram (LEXAPRO) 20 MG tablet; Take 1 tablet (20 mg total) by mouth daily.  Dispense: 90 tablet; Refill: 0 -6 months  electronically signed by:  Howard Pouch, DO  [place]

## 2018-06-02 NOTE — Patient Instructions (Signed)
Refilled all meds. Only change to meds is increase in benazepril to 1.5 tabs.   Follow up in 6 months--> fasting labs with provider appt.    Please help [place] help you:  We are honored you have chosen [facility] for your Primary Care home. Below you will find basic instructions that you may need to access in the future. Please help [place] help you by reading the instructions, which cover many of the frequent questions we experience.   Prescription refills and request:  -In order to allow more efficient response time, please call your pharmacy for all refills. They will forward the request electronically to [place]. This allows for the quickest possible response. Request left on a nurse line can take longer to refill, since these are checked as time allows between office patients and other phone calls.  - refill request can take up to 3-5 working days to complete.  - If request is sent electronically and request is appropiate, it is usually completed in 1-2 business days.  - all patients will need to be seen routinely for all chronic medical conditions requiring prescription medications (see follow-up below). If you are overdue for follow up on your condition, you will be asked to make an appointment and we will call in enough medication to cover you until your appointment (up to 30 days).  - all controlled substances will require a face to face visit to request/refill.  - if you desire your prescriptions to go through a new pharmacy, and have an active script at original pharmacy, you will need to call your pharmacy and have scripts transferred to new pharmacy. This is completed between the pharmacy locations and not by your provider.    Results: If any images or labs were ordered, it can take up to 1 week to get results depending on the test ordered and the lab/facility running and resulting the test. - Normal or stable results, which do not need further discussion, may be released to your mychart  immediately with attached note to you. A call may not be generated for normal results. Please make certain to sign up for mychart. If you have questions on how to activate your mychart you can call the front office.  - If your results need further discussion, our office will attempt to contact you via phone, and if unable to reach you after 2 attempts, we will release your abnormal result to your mychart with instructions.  - All results will be automatically released in mychart after 1 week.  - Your provider will provide you with explanation and instruction on all relevant material in your results. Please keep in mind, results and labs may appear confusing or abnormal to the untrained eye, but it does not mean they are actually abnormal for you personally. If you have any questions about your results that are not covered, or you desire more detailed explanation than what was provided, you should make an appointment with your provider to do so.   Our office handles many outgoing and incoming calls daily. If we have not contacted you within 1 week about your results, please check your mychart to see if there is a message first and if not, then contact our office.  In helping with this matter, you help decrease call volume, and therefore allow [place] to be able to respond to patients needs more efficiently.   Acute office visits (sick visit):  An acute visit is intended for a new problem and are scheduled  in shorter time slots to allow schedule openings for patients with new problems. This is the appropriate visit to discuss a new problem. Problems will not be addressed by phone call or Echart message. Appointment is needed if requesting treatment. In order to provide you with excellent quality medical care with proper time for you to explain your problem, have an exam and receive treatment with instructions, these appointments should be limited to one new problem per visit. If you experience a new problem, in  which you desire to be addressed, please make an acute office visit, we save openings on the schedule to accommodate you. Please do not save your new problem for any other type of visit, let us take care of it properly and quickly for you.   Follow up visits:  Depending on your condition(s) your provider will need to see you routinely in order to provide you with quality care and prescribe medication(s). Most chronic conditions (Example: hypertension, Diabetes, depression/anxiety... etc), require visits a couple times a year. Your provider will instruct you on proper follow up for your personal medical conditions and history. Please make certain to make follow up appointments for your condition as instructed. Failing to do so could result in lapse in your medication treatment/refills. If you request a refill, and are overdue to be seen on a condition, we will always provide you with a 30 day script (once) to allow you time to schedule.    Medicare wellness (well visit): - we have a wonderful Nurse Maudie Mercury), that will meet with you and provide you will yearly medicare wellness visits. These visits should occur yearly (can not be scheduled less than 1 calendar year apart) and cover preventive health, immunizations, advance directives and screenings you are entitled to yearly through your medicare benefits. Do not miss out on your entitled benefits, this is when medicare will pay for these benefits to be ordered for you.  These are strongly encouraged by your provider and is the appropriate type of visit to make certain you are up to date with all preventive health benefits. If you have not had your medicare wellness exam in the last 12 months, please make certain to schedule one by calling the office and schedule your medicare wellness with Maudie Mercury as soon as possible.   Yearly physical (well visit):  - Adults are recommended to be seen yearly for physicals. Check with your insurance and date of your last physical,  most insurances require one calendar year between physicals. Physicals include all preventive health topics, screenings, medical exam and labs that are appropriate for gender/age and history. You may have fasting labs needed at this visit. This is a well visit (not a sick visit), new problems should not be covered during this visit (see acute visit).  - Pediatric patients are seen more frequently when they are younger. Your provider will advise you on well child visit timing that is appropriate for your their age. - This is not a medicare wellness visit. Medicare wellness exams do not have an exam portion to the visit. Some medicare companies allow for a physical, some do not allow a yearly physical. If your medicare allows a yearly physical you can schedule the medicare wellness with our nurse Maudie Mercury and have your physical with your provider after, on the same day. Please check with insurance for your full benefits.   Late Policy/No Shows:  - all new patients should arrive 15-30 minutes earlier than appointment to allow [place] time  to  obtain all personal demographics,  insurance information and for you to complete office paperwork. - All established patients should arrive 10-15 minutes earlier than appointment time to update all information and be checked in .  - In our best efforts to run on time, if you are late for your appointment you will be asked to either reschedule or if able, we will work you back into the schedule. There will be a wait time to work you back in the schedule,  depending on availability.  - If you are unable to make it to your appointment as scheduled, please call 24 hours ahead of time to allow [place] to fill the time slot with someone else who needs to be seen. If you do not cancel your appointment ahead of time, you may be charged a no show fee.

## 2018-07-03 ENCOUNTER — Telehealth: Payer: Self-pay

## 2018-07-03 NOTE — Telephone Encounter (Signed)

## 2018-07-06 ENCOUNTER — Ambulatory Visit (INDEPENDENT_AMBULATORY_CARE_PROVIDER_SITE_OTHER): Payer: Medicare Other | Admitting: *Deleted

## 2018-07-06 ENCOUNTER — Other Ambulatory Visit: Payer: Self-pay

## 2018-07-06 DIAGNOSIS — Z7901 Long term (current) use of anticoagulants: Secondary | ICD-10-CM

## 2018-07-06 DIAGNOSIS — I4891 Unspecified atrial fibrillation: Secondary | ICD-10-CM | POA: Diagnosis not present

## 2018-07-06 LAB — POCT INR: INR: 2.2 (ref 2.0–3.0)

## 2018-07-13 ENCOUNTER — Other Ambulatory Visit: Payer: Self-pay

## 2018-07-13 ENCOUNTER — Ambulatory Visit (INDEPENDENT_AMBULATORY_CARE_PROVIDER_SITE_OTHER): Payer: Medicare Other | Admitting: Family Medicine

## 2018-07-13 ENCOUNTER — Encounter: Payer: Self-pay | Admitting: Family Medicine

## 2018-07-13 VITALS — BP 135/70 | HR 79 | Ht 65.0 in | Wt 197.0 lb

## 2018-07-13 DIAGNOSIS — I10 Essential (primary) hypertension: Secondary | ICD-10-CM | POA: Diagnosis not present

## 2018-07-13 DIAGNOSIS — I4891 Unspecified atrial fibrillation: Secondary | ICD-10-CM

## 2018-07-13 DIAGNOSIS — E785 Hyperlipidemia, unspecified: Secondary | ICD-10-CM

## 2018-07-13 DIAGNOSIS — I700 Atherosclerosis of aorta: Secondary | ICD-10-CM

## 2018-07-13 NOTE — Progress Notes (Addendum)
VIRTUAL VISIT VIA VIDEO-Fail  I connected with Letta Moynahan on 07/13/18 at 10:00 AM EDT by a video enabled telemedicine application and verified that I am speaking with the correct person using two identifiers. Location patient: Home Location provider: [facility], Office Persons participating in the virtual visit: Patient, Dr. Raoul Pitch and R.Baker, LPN  I discussed the limitations of evaluation and management by telemedicine and the availability of in person appointments. The patient expressed understanding and agreed to proceed.  Interactive audio and video telecommunications were attempted between this provider and patient, however failed, due to patient having technical difficulties OR patient did not have access to video capability. We continued and completed visit with audio only.    SUBJECTIVE Chief Complaint  Patient presents with  . Hypertension    No complaints. Takes BP everyday and she states it is running good. Pt took all meds around 9am   . Depression    HPI: Hypertension/hyperlipidemia/hypertriglyceridemia/A. fib: Pt reports appliance with Benzapril 30 mg, amlodipine 10 mg, metoprolol 25 mg twice a day, Lasix 20 mg (MWF) today. Blood pressures ranges at home are now within normal limits since the increase in benzapril from 20-30 mg QD. Patient denies chest pain, shortness of breath, dizziness or lower extremity edema.  Pt is on warfarin therapy per cardiology. Pt is prescribed Zetia BMP: 08/30/2017 CBC: 09/02/2017 Mild anemia, otherwise within normal limits Lipids: 12/2017 WNL- tr 176 TSH 08/25/2017: WNL PTH/CA- 01/12/2018--> mild elevation in PTH- calcium now normal.  Vit d 09/2016: 45 Diet: Low-sodium Exercise: Tries to exercise  RF: Hypertension, hyperlipidemia, family history of heart disease and stroke, personal history A. Fib, obesity, former smoker  ROS: See pertinent positives and negatives per HPI.  Patient Active Problem List   Diagnosis Date  Noted  . Hypocalcemia 01/12/2018  . Aortic atherosclerosis ([place]) 09/09/2017  . Atherosclerosis of native coronary artery of native heart without angina pectoris 09/09/2017  . Closed displaced fracture of left femoral neck ([place]) 08/28/2017  . Hip fracture ([place]) 08/25/2017  . Medicare annual wellness visit, subsequent 08/25/2015  . Hyperlipidemia LDL goal <100 08/25/2015  . Vitamin D deficiency 08/25/2015  . History of bladder cancer 08/25/2015  . Long term (current) use of anticoagulants 06/25/2010  . Atrial fibrillation ([place]) 06/28/2009  . Hypothyroidism 01/31/2009  . Depression 01/31/2009  . Essential hypertension, benign 01/31/2009  . Arthritis 01/31/2009  . Osteopenia 01/31/2009  . Insomnia 01/31/2009  . RENAL CALCULUS, HX OF 01/31/2009    Social History   Tobacco Use  . Smoking status: Former Smoker    Last attempt to quit: 03/18/1985    Years since quitting: 33.3  . Smokeless tobacco: Never Used  . Tobacco comment: denies   Substance Use Topics  . Alcohol use: No    Current Outpatient Medications:  .  amLODipine (NORVASC) 10 MG tablet, Take 1 tablet (10 mg total) by mouth daily., Disp: 90 tablet, Rfl: 1 .  benazepril (LOTENSIN) 20 MG tablet, Take 1.5 tablets (30 mg total) by mouth daily. Needs office visit prior to anymore refills., Disp: 135 tablet, Rfl: 1 .  Calcium Carbonate (CALTRATE 600) 1500 MG TABS, Take 3 tablets by mouth daily.  , Disp: , Rfl:  .  cholecalciferol (VITAMIN D) 1000 UNITS tablet, Take 1,000 Units by mouth daily.  , Disp: , Rfl:  .  escitalopram (LEXAPRO) 20 MG tablet, Take 1 tablet (20 mg total) by mouth daily., Disp: 90 tablet, Rfl: 1 .  ezetimibe (ZETIA) 10 MG tablet, Take  1 tablet (10 mg total) by mouth daily. Needs office visit prior to any additional refills., Disp: 90 tablet, Rfl: 3 .  furosemide (LASIX) 20 MG tablet, TAKE 1 TABLET BY MOUTH EVERY MONDAY, WEDNESDAY AND FRIDAY, Disp: 45 tablet, Rfl: 3 .  levothyroxine (SYNTHROID, LEVOTHROID) 75  MCG tablet, TAKE 1 TABLET DAILY, Disp: 90 tablet, Rfl: 3 .  metoprolol tartrate (LOPRESSOR) 50 MG tablet, Take 0.5 tablets (25 mg total) by mouth 2 (two) times daily., Disp: 180 tablet, Rfl: 1 .  Multiple Vitamins-Minerals (CENTRUM SILVER) tablet, Take 1 tablet by mouth daily.  , Disp: , Rfl:  .  nitroGLYCERIN (NITROSTAT) 0.4 MG SL tablet, PLACE 1 TABLET UNDER THE TONGUE EVERY 5 MINUTES AS NEEDED FOR CHEST PAIN,(MAX 3 TABLETS), Disp: 75 tablet, Rfl: 0 .  polyethylene glycol (MIRALAX / GLYCOLAX) packet, Take 17 g by mouth daily as needed for mild constipation or moderate constipation., Disp: 30 each, Rfl: 0 .  senna-docusate (SENOKOT S) 8.6-50 MG tablet, Take 2 tablets by mouth at bedtime., Disp: 60 tablet, Rfl: 0 .  traZODone (DESYREL) 100 MG tablet, Take 1 tablet (100 mg total) by mouth at bedtime., Disp: 90 tablet, Rfl: 1 .  warfarin (COUMADIN) 5 MG tablet, TAKE AS DIRECTED BY COUMADIN CLINIC, Disp: 40 tablet, Rfl: 3  Allergies  Allergen Reactions  . Ibandronate Sodium Other (See Comments)    Bones hurt  . Pravachol [Pravastatin Sodium] Other (See Comments)    Myalgias   . Ultram [Tramadol Hcl] Nausea Only    OBJECTIVE: BP 135/70   Pulse 79   Ht 5\' 5"  (1.651 m)   Wt 197 lb (89.4 kg)   BMI 32.78 kg/m  Gen: No acute distress. Nontoxic CV: no edema Chest: Cough or shortness of breath not present.  Neuro:  Alert. Oriented x3   ASSESSMENT AND PLAN: Jennifer House is a 73 y.o. female present for  Essential hypertension, benign/hyperlipidemia/A. Fib/morbid obesity/Atrial fibrillation, unspecified type (HCC)/Aortic atherosclerosis (HCC) - BP much improved after increase in benazepril to 30 mg Qd. She is tolerating the change.  - continue amlodipine, Lasix, metoprolol and Zetia at its current doses.  - Continue follow-up with cardiology for atrial fibrillation - low salt diet, increase exercise.  - Continue Coumadin per Coumadin Clinic -Follow up 6 months.-->  With fasting  labs/provider  appointment  > 15 minutes spent with patient, >50% of time spent face to face  Howard Pouch, DO 07/13/2018

## 2018-07-27 ENCOUNTER — Other Ambulatory Visit: Payer: Self-pay

## 2018-07-27 ENCOUNTER — Ambulatory Visit (INDEPENDENT_AMBULATORY_CARE_PROVIDER_SITE_OTHER): Payer: Medicare Other | Admitting: Family Medicine

## 2018-07-27 ENCOUNTER — Encounter: Payer: Self-pay | Admitting: Family Medicine

## 2018-07-27 VITALS — BP 112/72 | HR 75 | Temp 97.3°F | Resp 16 | Ht 65.0 in | Wt 201.0 lb

## 2018-07-27 DIAGNOSIS — B078 Other viral warts: Secondary | ICD-10-CM

## 2018-07-27 DIAGNOSIS — Z1283 Encounter for screening for malignant neoplasm of skin: Secondary | ICD-10-CM | POA: Diagnosis not present

## 2018-07-27 NOTE — Progress Notes (Signed)
OFFICE VISIT  07/27/2018   CC:  Chief Complaint  Patient presents with  . Hand Pain    Middle finger on L hand has some swelling and sore x3-4 weeks. Pt thought was Wart and used Compoud W with no success.      HPI:    Patient is a 73 y.o. Caucasian female who presents for finger pain. Noted a warty growth on palmar surface of middle phalanx of ring finger of left hand. Hurts her/bothers her mainly when she grabs something or bumps against something with the finger. No redness, no joint pain. She has tried otc compound W but no help.   Past Medical History:  Diagnosis Date  . Anxiety   . Atrial fibrillation ([place])    longstanding persistent  . 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 Risa Grill  . Colon polyps   . Depression   . GERD (gastroesophageal reflux disease)   . Headache(784.0)    hx of migraines  . Hematuria    hx  . HLD (hyperlipidemia)    mixed  . HTN (hypertension)   . Hypothyroidism   . Insomnia   . Liver function test abnormality    hx fatty liver  . Murmur, cardiac   . Nonalcoholic fatty liver disease   . Obesity   . Osteoarthritis    low back pain with left sciatica  . Osteopenia    dexa 2013  . Renal calculus    hx  . Restless leg syndrome     Past Surgical History:  Procedure Laterality Date  . APPENDECTOMY    . CARDIAC CATHETERIZATION  1/12   revealed normal cors and LV function  . COLONOSCOPY    . cystocopy  10/17/03   left stent placement, TURBT  . FOOT SURGERY    . LUMBAR LAMINECTOMY/DECOMPRESSION MICRODISCECTOMY N/A 01/19/2013   Procedure: LUMBAR THREE FOUR LUMBAR LAMINECTOMY/DECOMPRESSION MICRODISCECTOMY 1 LEVEL;  Surgeon: Faythe Ghee, MD;  [facility];  Service: Neurosurgery;  Laterality: N/A;  . TOTAL ABDOMINAL HYSTERECTOMY  1985  . TOTAL HIP ARTHROPLASTY Left 08/28/2017   Procedure: TOTAL HIP ARTHROPLASTY ANTERIOR APPROACH;  Surgeon: Rod Can, MD;  Location: [facility];  Service: Orthopedics;   Laterality: Left;    Outpatient Medications Prior to Visit  Medication Sig Dispense Refill  . amLODipine (NORVASC) 10 MG tablet Take 1 tablet (10 mg total) by mouth daily. 90 tablet 1  . benazepril (LOTENSIN) 20 MG tablet Take 1.5 tablets (30 mg total) by mouth daily. Needs office visit prior to anymore refills. 135 tablet 1  . Calcium Carbonate (CALTRATE 600) 1500 MG TABS Take 3 tablets by mouth daily.      . cholecalciferol (VITAMIN D) 1000 UNITS tablet Take 1,000 Units by mouth daily.      Marland Kitchen escitalopram (LEXAPRO) 20 MG tablet Take 1 tablet (20 mg total) by mouth daily. 90 tablet 1  . ezetimibe (ZETIA) 10 MG tablet Take 1 tablet (10 mg total) by mouth daily. Needs office visit prior to any additional refills. 90 tablet 3  . furosemide (LASIX) 20 MG tablet TAKE 1 TABLET BY MOUTH EVERY MONDAY, WEDNESDAY AND FRIDAY 45 tablet 3  . levothyroxine (SYNTHROID, LEVOTHROID) 75 MCG tablet TAKE 1 TABLET DAILY 90 tablet 3  . metoprolol tartrate (LOPRESSOR) 50 MG tablet Take 0.5 tablets (25 mg total) by mouth 2 (two) times daily. 180 tablet 1  . Multiple Vitamins-Minerals (CENTRUM SILVER) tablet Take 1 tablet by mouth daily.      Marland Kitchen  nitroGLYCERIN (NITROSTAT) 0.4 MG SL tablet PLACE 1 TABLET UNDER THE TONGUE EVERY 5 MINUTES AS NEEDED FOR CHEST PAIN,(MAX 3 TABLETS) 75 tablet 0  . polyethylene glycol (MIRALAX / GLYCOLAX) packet Take 17 g by mouth daily as needed for mild constipation or moderate constipation. 30 each 0  . senna-docusate (SENOKOT S) 8.6-50 MG tablet Take 2 tablets by mouth at bedtime. 60 tablet 0  . traZODone (DESYREL) 100 MG tablet Take 1 tablet (100 mg total) by mouth at bedtime. 90 tablet 1  . warfarin (COUMADIN) 5 MG tablet TAKE AS DIRECTED BY COUMADIN CLINIC 40 tablet 3   No facility-administered medications prior to visit.     Allergies  Allergen Reactions  . Ibandronate Sodium Other (See Comments)    Bones hurt  . Pravachol [Pravastatin Sodium] Other (See Comments)    Myalgias    . Ultram [Tramadol Hcl] Nausea Only    ROS As per HPI  PE: Blood pressure 112/72, pulse 75, temperature (!) 97.3 F (36.3 C), temperature source Oral, resp. rate 16, height 5\' 5"  (1.651 m), weight 201 lb (91.2 kg), SpO2 99 %. Gen: Alert, well appearing.  Patient is oriented to person, place, time, and situation. AFFECT: pleasant, lucid thought and speech. Left hand ring finger volar surface of middle phalanx with 2-3 mm round verrucous skin lesion that is flesh colored.  No callus. Mild TTP when I press firmly on it.  ROM of finger intact.  No erythema or swelling.  LABS:    Chemistry      Component Value Date/Time   NA 137 08/30/2017 0600   K 3.8 08/30/2017 0600   CL 104 08/30/2017 0600   CO2 27 08/30/2017 0600   BUN 18 08/30/2017 0600   CREATININE 0.84 08/30/2017 0600   CREATININE 0.91 09/19/2016 1009      Component Value Date/Time   CALCIUM 9.7 01/12/2018 1045   ALKPHOS 73 09/19/2016 1009   AST 23 09/19/2016 1009   ALT 24 09/19/2016 1009   BILITOT 0.6 09/19/2016 1009     Lab Results  Component Value Date   WBC 5.7 09/02/2017   HGB 11.2 (L) 09/02/2017   HCT 36.6 09/02/2017   MCV 87.6 09/02/2017   PLT 213 09/02/2017   Lab Results  Component Value Date   INR 2.2 07/06/2018   INR 2.2 05/25/2018   INR 2.7 04/13/2018    IMPRESSION AND PLAN:  Common wart on finger: will have her continue compound W. I did three freeze/thaw cycles of 40 seconds each with histofreezer today in office. Pt tolerated this w/out problem.  Will refer to derm for skin ca screening AND to take care of this wart (if it has not resolved on it's own prior to derm appt).  An After Visit Summary was printed and given to the patient.  FOLLOW UP: No follow-ups on file.  Signed:  Crissie Sickles, MD           07/27/2018

## 2018-08-09 ENCOUNTER — Other Ambulatory Visit: Payer: Self-pay | Admitting: Internal Medicine

## 2018-08-11 DIAGNOSIS — L821 Other seborrheic keratosis: Secondary | ICD-10-CM | POA: Diagnosis not present

## 2018-08-11 DIAGNOSIS — B079 Viral wart, unspecified: Secondary | ICD-10-CM | POA: Diagnosis not present

## 2018-08-11 DIAGNOSIS — D229 Melanocytic nevi, unspecified: Secondary | ICD-10-CM | POA: Diagnosis not present

## 2018-08-18 ENCOUNTER — Telehealth: Payer: Self-pay

## 2018-08-18 NOTE — Telephone Encounter (Signed)
lmom for prescreen  

## 2018-08-21 ENCOUNTER — Other Ambulatory Visit: Payer: Self-pay

## 2018-08-21 ENCOUNTER — Ambulatory Visit: Payer: Medicare Other | Admitting: *Deleted

## 2018-08-21 DIAGNOSIS — Z7901 Long term (current) use of anticoagulants: Secondary | ICD-10-CM | POA: Diagnosis not present

## 2018-08-21 DIAGNOSIS — I4891 Unspecified atrial fibrillation: Secondary | ICD-10-CM | POA: Diagnosis not present

## 2018-08-21 LAB — POCT INR: INR: 2.1 (ref 2.0–3.0)

## 2018-08-21 NOTE — Patient Instructions (Signed)
Description   Continue on same dosage 1 tablet everyday. Recheck INR in 8 weeks. # [phone] Coumadin clinic. Main # [phone].

## 2018-08-26 ENCOUNTER — Telehealth: Payer: Self-pay

## 2018-08-28 DIAGNOSIS — S72032D Displaced midcervical fracture of left femur, subsequent encounter for closed fracture with routine healing: Secondary | ICD-10-CM | POA: Diagnosis not present

## 2018-08-28 NOTE — Telephone Encounter (Signed)
Spoke with pt regarding appt on 08/31/18. Pt stated she will check vitals prior to appt and did not have any questions at this moment.  

## 2018-08-31 ENCOUNTER — Encounter: Payer: Self-pay | Admitting: Internal Medicine

## 2018-08-31 ENCOUNTER — Telehealth (INDEPENDENT_AMBULATORY_CARE_PROVIDER_SITE_OTHER): Payer: Medicare Other | Admitting: Internal Medicine

## 2018-08-31 VITALS — BP 148/79 | HR 79 | Ht 65.0 in | Wt 198.2 lb

## 2018-08-31 DIAGNOSIS — I482 Chronic atrial fibrillation, unspecified: Secondary | ICD-10-CM | POA: Diagnosis not present

## 2018-08-31 DIAGNOSIS — I10 Essential (primary) hypertension: Secondary | ICD-10-CM | POA: Diagnosis not present

## 2018-08-31 NOTE — Progress Notes (Signed)
Electrophysiology TeleHealth Note   Due to national recommendations of social distancing due to COVID 19, an audio/video telehealth visit is felt to be most appropriate for this patient at this time.  See MyChart message from today for the patient's consent to telehealth for [facility].   Date:  08/31/2018   ID:  Jennifer House, DOB February 05, 1946, MRN [id number]  Location: patient's home  Provider location:  [place]  Evaluation Performed: Follow-up visit  PCP:  Ma Hillock, DO   Electrophysiologist:  Dr Rayann Heman  Chief Complaint:  palpitations  History of Present Illness:    Jennifer House is a 73 y.o. female who presents via telehealth conferencing today.  Since last being seen in our clinic, the patient reports doing very well.  Today, she denies symptoms of palpitations, chest pain, shortness of breath,  lower extremity edema, dizziness, presyncope, or syncope.  The patient is otherwise without complaint today.  The patient denies symptoms of fevers, chills, cough, or new SOB worrisome for COVID 19.  Past Medical History:  Diagnosis Date  . Anxiety   . Atrial fibrillation ([place])    longstanding persistent  . 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 Risa Grill  . Colon polyps   . Depression   . GERD (gastroesophageal reflux disease)   . Headache(784.0)    hx of migraines  . Hematuria    hx  . HLD (hyperlipidemia)    mixed  . HTN (hypertension)   . Hypothyroidism   . Insomnia   . Liver function test abnormality    hx fatty liver  . Murmur, cardiac   . Nonalcoholic fatty liver disease   . Obesity   . Osteoarthritis    low back pain with left sciatica  . Osteopenia    dexa 2013  . Renal calculus    hx  . Restless leg syndrome     Past Surgical History:  Procedure Laterality Date  . APPENDECTOMY    . CARDIAC CATHETERIZATION  1/12   revealed normal cors and LV function  . COLONOSCOPY    . cystocopy  10/17/03   left stent  placement, TURBT  . FOOT SURGERY    . LUMBAR LAMINECTOMY/DECOMPRESSION MICRODISCECTOMY N/A 01/19/2013   Procedure: LUMBAR THREE FOUR LUMBAR LAMINECTOMY/DECOMPRESSION MICRODISCECTOMY 1 LEVEL;  Surgeon: Faythe Ghee, MD;  [facility];  Service: Neurosurgery;  Laterality: N/A;  . TOTAL ABDOMINAL HYSTERECTOMY  1985  . TOTAL HIP ARTHROPLASTY Left 08/28/2017   Procedure: TOTAL HIP ARTHROPLASTY ANTERIOR APPROACH;  Surgeon: Rod Can, MD;  Location: [facility];  Service: Orthopedics;  Laterality: Left;    Current Outpatient Medications  Medication Sig Dispense Refill  . amLODipine (NORVASC) 10 MG tablet Take 1 tablet (10 mg total) by mouth daily. 90 tablet 1  . benazepril (LOTENSIN) 20 MG tablet Take 1.5 tablets (30 mg total) by mouth daily. Needs office visit prior to anymore refills. 135 tablet 1  . Calcium Carbonate (CALTRATE 600) 1500 MG TABS Take 3 tablets by mouth daily.      . cholecalciferol (VITAMIN D) 1000 UNITS tablet Take 1,000 Units by mouth daily.      Marland Kitchen escitalopram (LEXAPRO) 20 MG tablet Take 1 tablet (20 mg total) by mouth daily. 90 tablet 1  . ezetimibe (ZETIA) 10 MG tablet Take 1 tablet (10 mg total) by mouth daily. Needs office visit prior to any additional refills. 90 tablet 3  . furosemide (LASIX) 20 MG tablet TAKE  1 TABLET BY MOUTH EVERY MONDAY, WEDNESDAY AND FRIDAY 45 tablet 3  . levothyroxine (SYNTHROID, LEVOTHROID) 75 MCG tablet TAKE 1 TABLET DAILY 90 tablet 3  . metoprolol tartrate (LOPRESSOR) 50 MG tablet Take 0.5 tablets (25 mg total) by mouth 2 (two) times daily. 180 tablet 1  . Multiple Vitamins-Minerals (CENTRUM SILVER) tablet Take 1 tablet by mouth daily.      . nitroGLYCERIN (NITROSTAT) 0.4 MG SL tablet PLACE 1 TABLET UNDER THE TONGUE EVERY 5 MINUTES AS NEEDED FOR CHEST PAIN,(MAX 3 TABLETS) 75 tablet 0  . polyethylene glycol (MIRALAX / GLYCOLAX) packet Take 17 g by mouth daily as needed for mild constipation or moderate constipation. 30 each 0  .  senna-docusate (SENOKOT S) 8.6-50 MG tablet Take 2 tablets by mouth at bedtime. 60 tablet 0  . traZODone (DESYREL) 100 MG tablet Take 1 tablet (100 mg total) by mouth at bedtime. 90 tablet 1  . warfarin (COUMADIN) 5 MG tablet TAKE AS DIRECTED BY COUMADIN CLINIC 40 tablet 3   No current facility-administered medications for this visit.     Allergies:   Ibandronate sodium, Pravachol [pravastatin sodium], and Ultram [tramadol hcl]   Social History:  The patient  reports that she quit smoking about 33 years ago. She has never used smokeless tobacco. She reports that she does not drink alcohol or use drugs.   Family History:  The patient's  family history includes Heart attack in her mother; Heart failure in her mother; Hypertension in her father and mother; Stroke in her brother and father; Thyroid cancer in her mother.   ROS:  Please see the history of present illness.   All other systems are personally reviewed and negative.    Exam:    Vital Signs:  BP (!) 148/79   Pulse 79   Ht 5\' 5"  (1.651 m)   Wt 198 lb 3.2 oz (89.9 kg)   BMI 32.98 kg/m   Well appearing, NAD, A&Ox3, normal WOB   Labs/Other Tests and Data Reviewed:    Recent Labs: 09/02/2017: Hemoglobin 11.2; Platelets 213   Wt Readings from Last 3 Encounters:  08/31/18 198 lb 3.2 oz (89.9 kg)  07/27/18 201 lb (91.2 kg)  07/13/18 197 lb (89.4 kg)     ASSESSMENT & PLAN:    1.  Permanent afib Rate controlled Continue coumadin  2. HTN Stable No change required today  3. Overweight Lifestyle modification encouraged  4. HL Stable No change required today   Follow-up:  12 months with EP PA   Patient Risk:  after full review of this patients clinical status, I feel that they are at moderate risk at this time.  Today, I have spent 15 minutes with the patient with telehealth technology discussing arrhythmia management .    Army Fossa, MD  08/31/2018 11:20 AM     [facility] [address] [place] [address] [phone] (office) [phone] (fax)

## 2018-09-15 DIAGNOSIS — D229 Melanocytic nevi, unspecified: Secondary | ICD-10-CM | POA: Diagnosis not present

## 2018-09-15 DIAGNOSIS — L821 Other seborrheic keratosis: Secondary | ICD-10-CM | POA: Diagnosis not present

## 2018-10-13 ENCOUNTER — Telehealth: Payer: Self-pay | Admitting: *Deleted

## 2018-10-13 NOTE — Telephone Encounter (Signed)

## 2018-10-16 ENCOUNTER — Ambulatory Visit (INDEPENDENT_AMBULATORY_CARE_PROVIDER_SITE_OTHER): Payer: Medicare Other | Admitting: *Deleted

## 2018-10-16 ENCOUNTER — Other Ambulatory Visit: Payer: Self-pay

## 2018-10-16 DIAGNOSIS — Z7901 Long term (current) use of anticoagulants: Secondary | ICD-10-CM | POA: Diagnosis not present

## 2018-10-16 LAB — POCT INR: INR: 2 (ref 2.0–3.0)

## 2018-10-16 NOTE — Patient Instructions (Signed)
Description   Continue on same dosage 1 tablet everyday. Recheck INR in 8 weeks. # [phone] Coumadin clinic. Main # [phone].

## 2018-10-23 ENCOUNTER — Telehealth: Payer: Self-pay | Admitting: Family Medicine

## 2018-10-23 NOTE — Telephone Encounter (Signed)
Caller name: Suanne Marker  Relation to pt: NP with [employer] from [facility]  Call back number: No call back needed as per nurse   Reason for call:  Nurse yearly house call visit shows patient is a high fall risk 6 out of 6 and patient is not using any durable medical equipment, nurse advises patient should follow up with PCP.

## 2018-10-23 NOTE — Telephone Encounter (Signed)
Please advise 

## 2018-10-23 NOTE — Telephone Encounter (Signed)
Noted  

## 2018-10-23 NOTE — Telephone Encounter (Signed)
I think this may have accidentally been routed to me.

## 2018-11-20 ENCOUNTER — Ambulatory Visit: Payer: Medicare Other | Admitting: Medical

## 2018-11-30 ENCOUNTER — Encounter: Payer: Self-pay | Admitting: Family Medicine

## 2018-11-30 ENCOUNTER — Ambulatory Visit (INDEPENDENT_AMBULATORY_CARE_PROVIDER_SITE_OTHER): Payer: Medicare Other | Admitting: Family Medicine

## 2018-11-30 ENCOUNTER — Other Ambulatory Visit: Payer: Self-pay

## 2018-11-30 VITALS — BP 121/76 | HR 62 | Temp 97.2°F | Resp 17 | Ht 65.0 in | Wt 204.2 lb

## 2018-11-30 DIAGNOSIS — M898X9 Other specified disorders of bone, unspecified site: Secondary | ICD-10-CM

## 2018-11-30 DIAGNOSIS — Z23 Encounter for immunization: Secondary | ICD-10-CM

## 2018-11-30 DIAGNOSIS — E213 Hyperparathyroidism, unspecified: Secondary | ICD-10-CM

## 2018-11-30 DIAGNOSIS — Z8551 Personal history of malignant neoplasm of bladder: Secondary | ICD-10-CM | POA: Diagnosis not present

## 2018-11-30 DIAGNOSIS — M549 Dorsalgia, unspecified: Secondary | ICD-10-CM | POA: Diagnosis not present

## 2018-11-30 DIAGNOSIS — M791 Myalgia, unspecified site: Secondary | ICD-10-CM | POA: Diagnosis not present

## 2018-11-30 DIAGNOSIS — M6281 Muscle weakness (generalized): Secondary | ICD-10-CM

## 2018-11-30 DIAGNOSIS — Z8639 Personal history of other endocrine, nutritional and metabolic disease: Secondary | ICD-10-CM | POA: Diagnosis not present

## 2018-11-30 DIAGNOSIS — Z1231 Encounter for screening mammogram for malignant neoplasm of breast: Secondary | ICD-10-CM

## 2018-11-30 MED ORDER — TRAMADOL HCL 50 MG PO TABS: 50.0000 mg | ORAL_TABLET | Freq: Three times a day (TID) | ORAL | 0 refills | Status: AC | PRN

## 2018-11-30 NOTE — Progress Notes (Signed)
Jennifer House , [birth year], 73 y.o., female MRN: [id number] Patient Care Team    Relationship Specialty Notifications Start End  Ma Hillock, DO PCP - General Family Medicine  01/23/15   Thompson Grayer, MD Attending Physician Cardiology  07/25/11   Ronald Lobo, MD Consulting Physician Gastroenterology  08/25/15   Rana Snare, MD Consulting Physician Urology  08/25/15   Renelda Loma, OD  Optometry  08/25/15    Comment: "kimberly" at [facility]    Chief Complaint  Patient presents with  . Back Pain    Pt has had back pain x2 weeks. Pt states arms, legs, and all bones feel as they hurt all the time. No injury.      Subjective: Pt presents for an OV with complaints of back pain of 2 weeks  duration.  Associated symptoms include muscles of arms and legs hurt all the time and her bones hurt.  She complains of back pain both thoracic and lumbar spine.  She states her back pain is worsened with movement.  She denies any bladder or bowel changes or dysfunction. She has a known significant history of osteopenia, she was intolerant to Boniva secondary to bone pain.  She has a history of lumbar fusion L3-L4.  She also has a history of renal calculus and bladder cancer.  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  She reports her muscles and bones just feels fatigued and uncomfortable all the time for the past 2 weeks.  Pt has tried OTC pain reflief products to ease their symptoms.   Depression screen [facility] 2/9 06/02/2018 01/12/2018 09/09/2017 04/30/2017 03/26/2017  Decreased Interest 0 0 0 0 0  Down, Depressed, Hopeless 0 0 0 0 0  PHQ - 2 Score 0 0 0 0 0  Altered sleeping 0 0 - - 0  Tired, decreased energy 0 1 - - 3  Change in appetite 0 0 - - 0  Feeling bad or failure about yourself  0 0 - - 0  Trouble concentrating 0 1 - - 0  Moving slowly or fidgety/restless 0 0 - - 3  Suicidal thoughts 0 0 - - 0  PHQ-9 Score 0 2 - - 6  Difficult doing  work/chores Not difficult at all Not difficult at all - - Not difficult at all    Allergies  Allergen Reactions  . Ibandronate Sodium Other (See Comments)    Bones hurt  . Pravachol [Pravastatin Sodium] Other (See Comments)    Myalgias   . Ultram [Tramadol Hcl] Nausea Only   Social History   Social History Narrative   Lives in [place], with husband Eddie Dibbles.   Has 4 adult children , highest level of education is seventh grade.   Former smoker, denies recreational drugs or alcohol use.   Patient drink caffeine beverages, takes a daily vitamin   Patient was her seatbelt , she has dentures , she has a smoke detector in the home , there are guns in the home a  Locked case    Patient feels safe in her relationships       Past Medical History:  Diagnosis Date  . Anxiety   . Atrial fibrillation ([place])    longstanding persistent  . 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 Risa Grill  . Colon polyps   . Depression   . GERD (gastroesophageal reflux disease)   . Headache(784.0)    hx  of migraines  . Hematuria    hx  . HLD (hyperlipidemia)    mixed  . HTN (hypertension)   . Hypothyroidism   . Insomnia   . Liver function test abnormality    hx fatty liver  . Murmur, cardiac   . Nonalcoholic fatty liver disease   . Obesity   . Osteoarthritis    low back pain with left sciatica  . Osteopenia    dexa 2013  . Renal calculus    hx  . Restless leg syndrome    Past Surgical History:  Procedure Laterality Date  . APPENDECTOMY    . CARDIAC CATHETERIZATION  1/12   revealed normal cors and LV function  . COLONOSCOPY    . cystocopy  10/17/03   left stent placement, TURBT  . FOOT SURGERY    . LUMBAR LAMINECTOMY/DECOMPRESSION MICRODISCECTOMY N/A 01/19/2013   Procedure: LUMBAR THREE FOUR LUMBAR LAMINECTOMY/DECOMPRESSION MICRODISCECTOMY 1 LEVEL;  Surgeon: Faythe Ghee, MD;  [facility];  Service: Neurosurgery;  Laterality: N/A;  . TOTAL ABDOMINAL  HYSTERECTOMY  1985  . TOTAL HIP ARTHROPLASTY Left 08/28/2017   Procedure: TOTAL HIP ARTHROPLASTY ANTERIOR APPROACH;  Surgeon: Rod Can, MD;  Location: [facility];  Service: Orthopedics;  Laterality: Left;   Family History  Problem Relation Age of Onset  . Heart failure Mother   . Heart attack Mother   . Hypertension Mother   . Thyroid cancer Mother   . Stroke Father   . Hypertension Father   . Stroke Brother   . Breast cancer Neg Hx   . Colon cancer Neg Hx    Allergies as of 11/30/2018      Reactions   Ibandronate Sodium Other (See Comments)   Bones hurt   Pravachol [pravastatin Sodium] Other (See Comments)   Myalgias   Ultram [tramadol Hcl] Nausea Only      Medication List       Accurate as of November 30, 2018  1:58 PM. If you have any questions, ask your nurse or doctor.        amLODipine 10 MG tablet Commonly known as: NORVASC Take 1 tablet (10 mg total) by mouth daily.   benazepril 20 MG tablet Commonly known as: LOTENSIN Take 1.5 tablets (30 mg total) by mouth daily. Needs office visit prior to anymore refills.   Caltrate 600 1500 (600 Ca) MG Tabs tablet Generic drug: calcium carbonate Take 3 tablets by mouth daily.   Centrum Silver tablet Take 1 tablet by mouth daily.   cholecalciferol 1000 units tablet Commonly known as: VITAMIN D Take 1,000 Units by mouth daily.   escitalopram 20 MG tablet Commonly known as: Lexapro Take 1 tablet (20 mg total) by mouth daily.   ezetimibe 10 MG tablet Commonly known as: ZETIA Take 1 tablet (10 mg total) by mouth daily. Needs office visit prior to any additional refills.   furosemide 20 MG tablet Commonly known as: LASIX TAKE 1 TABLET BY MOUTH EVERY MONDAY, WEDNESDAY AND FRIDAY   levothyroxine 75 MCG tablet Commonly known as: SYNTHROID TAKE 1 TABLET DAILY   metoprolol tartrate 50 MG tablet Commonly known as: LOPRESSOR Take 0.5 tablets (25 mg total) by mouth 2 (two) times daily.   nitroGLYCERIN 0.4 MG SL  tablet Commonly known as: NITROSTAT PLACE 1 TABLET UNDER THE TONGUE EVERY 5 MINUTES AS NEEDED FOR CHEST PAIN,(MAX 3 TABLETS)   polyethylene glycol 17 g packet Commonly known as: MIRALAX / GLYCOLAX Take 17 g by mouth daily as needed  for mild constipation or moderate constipation.   senna-docusate 8.6-50 MG tablet Commonly known as: Senokot S Take 2 tablets by mouth at bedtime.   traZODone 100 MG tablet Commonly known as: DESYREL Take 1 tablet (100 mg total) by mouth at bedtime.   warfarin 5 MG tablet Commonly known as: COUMADIN Take as directed by the anticoagulation clinic. If you are unsure how to take this medication, talk to your nurse or doctor. Original instructions: TAKE AS DIRECTED BY COUMADIN CLINIC       All past medical history, surgical history, allergies, family history, immunizations andmedications were updated in the EMR today and reviewed under the history and medication portions of their EMR.     ROS: Negative, with the exception of above mentioned in HPI   Objective:  BP 121/76 (BP Location: Right Arm, Patient Position: Sitting, Cuff Size: Normal)   Pulse 62   Temp (!) 97.2 F (36.2 C) (Temporal)   Resp 17   Ht _0  (1.651 m)   Wt 204 lb 4 oz (92.6 kg)   SpO2 98%   BMI 33.99 kg/m  Body mass index is 33.99 kg/m. Gen: Afebrile. No acute distress. Nontoxic in appearance, well developed, well nourished.  Very pleasant, obese Caucasian female. HENT: AT. [place]. MMM Eyes:Pupils Equal Round Reactive to light, Extraocular movements intact,  Conjunctiva without redness, discharge or icterus. Neck/lymp/endocrine: Supple, no lymphadenopathy, no thyromegaly CV: RRR no murmur, no edema Chest: CTAB, no wheeze or crackles. Good air movement, normal resp effort.  Abd: Soft. NTND. BS present.  No masses palpated. No rebound or guarding. MSK: Thoracic and lumbar spine without erythema, no bony tenderness.  Left thoracic paraspinal muscle ropiness.  Mild tenderness at this  location.  No SI discomfort bilaterally.  Muscle strength 5/5 bilateral upper and lower extremity.  Neurovascularly intact distally. No CVA tenderness bilaterally Skin: No rashes rashes, purpura or petechiae.  Neuro: Normal gait, guarded approach to stepping up on step to table but was able to complete on her own. Alert. Oriented x3 Psych: Normal affect, dress and demeanor. Normal speech. Normal thought content and judgment.  No exam data present No results found. No results found for this or any previous visit (from the past 24 hour(s)).  Assessment/Plan: SHADAI MCCLANE is a 73 y.o. female present for OV for  Need for influenza vaccination - Flu Vaccine QUAD High Dose(Fluad) Myalgia/muscle weakness/bone pain -Uncertain etiology of her complaints.  Discussed with her some possible differential diagnosis including vitamin deficiencies, iron deficiency, autoimmune disorders etc.  She has had a mildly elevated parathyroid hormone October last year when she had a low calcium level.  However her calcium levels returned to normal.  We will rule out a parathyroid hormone disorder.  Her mother had a history of thyroid cancer. Consider further eval.  - She has had a history of bladder cancer in 2000 which can be concerning.  She has no urinary complaints today.  Will further evaluate with urinalysis - PTH, Intact and Calcium - Vitamin D (25 hydroxy) - TSH - B12 - CK (Creatine Kinase) - Comp Met (CMET) - Iron, TIBC and Ferritin Panel  Acute bilateral back pain, unspecified back location Pain of both her thoracic and lumbar spine.  She complains more of her upper thoracic pain. -Last mammogram 10/25/2016 was normal.  No family history of breast cancer.  Would encourage her to update her mammogram>>ordered. - PTH, Intact and Calcium - Vitamin D (25 hydroxy) - TSH - B12 - CK (Creatine  Kinase) - Comp Met (CMET) - Iron, TIBC and Ferritin Panel   F/u dependent upon image and lab results.    Reviewed expectations re: course of current medical issues.  Discussed self-management of symptoms.  Outlined signs and symptoms indicating need for more acute intervention.  Patient verbalized understanding and all questions were answered.  Patient received an After-Visit Summary.    Orders Placed This Encounter  Procedures  . Flu Vaccine QUAD High Dose(Fluad)     Note is dictated utilizing voice recognition software. Although note has been proof read prior to signing, occasional typographical errors still can be missed. If any questions arise, please do not hesitate to call for verification.   electronically signed by:  Howard Pouch, DO  [place]

## 2018-11-30 NOTE — Patient Instructions (Signed)
We will call you after we get your labs.  If labs are normal we will discuss next step.  I called in the tramadol for you. Use only if needed. This is a controlled substance and will need a face to face visit for any refills.

## 2018-12-01 LAB — COMPREHENSIVE METABOLIC PANEL
ALT: 25 U/L (ref 0–35)
AST: 27 U/L (ref 0–37)
Albumin: 4.3 g/dL (ref 3.5–5.2)
Alkaline Phosphatase: 64 U/L (ref 39–117)
BUN: 15 mg/dL (ref 6–23)
CO2: 29 mEq/L (ref 19–32)
Calcium: 9.5 mg/dL (ref 8.4–10.5)
Chloride: 103 mEq/L (ref 96–112)
Creatinine, Ser: 1.21 mg/dL — ABNORMAL HIGH (ref 0.40–1.20)
GFR: 43.35 mL/min — ABNORMAL LOW (ref >60.00)
Glucose, Bld: 76 mg/dL (ref 70–99)
Potassium: 4 mEq/L (ref 3.5–5.1)
Sodium: 141 mEq/L (ref 135–145)
Total Bilirubin: 0.4 mg/dL (ref 0.2–1.2)
Total Protein: 6.7 g/dL (ref 6.0–8.3)

## 2018-12-01 LAB — EXTRA SPECIMEN

## 2018-12-01 LAB — IRON,TIBC AND FERRITIN PANEL
%SAT: 32 % (calc) (ref 16–45)
Ferritin: 182 ng/mL (ref 16–288)
Iron: 96 ug/dL (ref 45–160)
TIBC: 303 mcg/dL (calc) (ref 250–450)

## 2018-12-01 LAB — VITAMIN D 25 HYDROXY (VIT D DEFICIENCY, FRACTURES): VITD: 30.19 ng/mL (ref 30.00–100.00)

## 2018-12-01 LAB — VITAMIN B12: Vitamin B-12: 361 pg/mL (ref 211–911)

## 2018-12-01 LAB — PTH, INTACT AND CALCIUM
Calcium: 9.5 mg/dL (ref 8.6–10.4)
PTH: 138 pg/mL — ABNORMAL HIGH (ref 14–64)

## 2018-12-01 LAB — TSH: TSH: 1.73 u[IU]/mL (ref 0.35–4.50)

## 2018-12-01 LAB — CK: Total CK: 59 U/L (ref 7–177)

## 2018-12-02 ENCOUNTER — Telehealth: Payer: Self-pay | Admitting: Family Medicine

## 2018-12-02 DIAGNOSIS — E213 Hyperparathyroidism, unspecified: Secondary | ICD-10-CM

## 2018-12-02 DIAGNOSIS — M8580 Other specified disorders of bone density and structure, unspecified site: Secondary | ICD-10-CM

## 2018-12-02 DIAGNOSIS — E2839 Other primary ovarian failure: Secondary | ICD-10-CM

## 2018-12-02 DIAGNOSIS — N183 Chronic kidney disease, stage 3 unspecified: Secondary | ICD-10-CM

## 2018-12-02 DIAGNOSIS — M549 Dorsalgia, unspecified: Secondary | ICD-10-CM

## 2018-12-02 NOTE — Telephone Encounter (Signed)
Please inform patient the following information: Her labs are normal with following exceptions-    - her parathyroid hormone level is rising >> these can cause the symptoms she is experiencing. I am referring her to an endocrinologist for further evaluation and ordering a bone density test.    - her b12 is low at 361>> start daily b12 1000  Mcg Qd.     - Her vit d is low normal 30. >>increase vit d to 2000u daily.     - her kidney function has declined over the last year. This is concerning. I would like her to hydrate and I want to repeat that test for verification. Please schedule her a lab appt next week and if tramadol is working for her and she wants to continue she will need a provider appt next week as well so we can continue tramadol "long term". - I have also ordered an xray of her back- she can have this completed at [facility] today or early tomorrow (otherwise I will not get it back until Monday).

## 2018-12-02 NOTE — Telephone Encounter (Signed)
Pt was called and pt asked for me to speak with daughter, spoke with daughter, was given lab results and all instructions. They verbalized understanding. Daughter will call me Friday or Monday morning to schedule lab/visit depending on how tramadol works for patient

## 2018-12-03 ENCOUNTER — Ambulatory Visit: Payer: Medicare Other | Admitting: Family Medicine

## 2018-12-04 ENCOUNTER — Other Ambulatory Visit: Payer: Self-pay

## 2018-12-04 DIAGNOSIS — F331 Major depressive disorder, recurrent, moderate: Secondary | ICD-10-CM

## 2018-12-04 DIAGNOSIS — I10 Essential (primary) hypertension: Secondary | ICD-10-CM

## 2018-12-04 NOTE — Telephone Encounter (Signed)
Faxed refill request for patients medications from [facility]   RF request for Lexapro  LOV:11/30/2018 Next ov: Not scheduled  Last written: 06/02/2018 #90 x1  Amlodopine  LOV 11/30/2018  NOV Not scheduled  Last written 06/02/2018 #90 x1

## 2018-12-07 MED ORDER — AMLODIPINE BESYLATE 10 MG PO TABS: 10.0000 mg | ORAL_TABLET | Freq: Every day | ORAL | 0 refills | Status: DC

## 2018-12-07 MED ORDER — ESCITALOPRAM OXALATE 20 MG PO TABS: 20.0000 mg | ORAL_TABLET | Freq: Every day | ORAL | 0 refills | Status: DC

## 2018-12-07 NOTE — Addendum Note (Signed)
Addended by: Howard Pouch A on: 12/07/2018 07:07 AM   Modules accepted: Orders

## 2018-12-07 NOTE — Telephone Encounter (Signed)
Please call patient. I have received refill request and pt is due for 6 mos follow up mid -Oct. I have refilled her meds for 90 days. Please schedule her follow up in mid-Oct.  Thanks!

## 2018-12-08 ENCOUNTER — Ambulatory Visit (HOSPITAL_BASED_OUTPATIENT_CLINIC_OR_DEPARTMENT_OTHER)
Admission: RE | Admit: 2018-12-08 | Discharge: 2018-12-08 | Disposition: A | Discharge status: Home / Self Care | Payer: Medicare Other | Source: Ambulatory Visit | Attending: Family Medicine | Admitting: Family Medicine

## 2018-12-08 ENCOUNTER — Telehealth: Payer: Self-pay | Admitting: Family Medicine

## 2018-12-08 ENCOUNTER — Other Ambulatory Visit: Payer: Self-pay

## 2018-12-08 DIAGNOSIS — E213 Hyperparathyroidism, unspecified: Secondary | ICD-10-CM | POA: Diagnosis not present

## 2018-12-08 DIAGNOSIS — M47816 Spondylosis without myelopathy or radiculopathy, lumbar region: Secondary | ICD-10-CM | POA: Diagnosis not present

## 2018-12-08 DIAGNOSIS — E2839 Other primary ovarian failure: Secondary | ICD-10-CM

## 2018-12-08 DIAGNOSIS — M8580 Other specified disorders of bone density and structure, unspecified site: Secondary | ICD-10-CM | POA: Diagnosis not present

## 2018-12-08 DIAGNOSIS — Z780 Asymptomatic menopausal state: Secondary | ICD-10-CM | POA: Diagnosis not present

## 2018-12-08 DIAGNOSIS — M549 Dorsalgia, unspecified: Secondary | ICD-10-CM

## 2018-12-08 DIAGNOSIS — M4804 Spinal stenosis, thoracic region: Secondary | ICD-10-CM | POA: Diagnosis not present

## 2018-12-08 DIAGNOSIS — M85851 Other specified disorders of bone density and structure, right thigh: Secondary | ICD-10-CM | POA: Diagnosis not present

## 2018-12-08 DIAGNOSIS — M48061 Spinal stenosis, lumbar region without neurogenic claudication: Secondary | ICD-10-CM | POA: Diagnosis not present

## 2018-12-08 DIAGNOSIS — M85852 Other specified disorders of bone density and structure, left thigh: Secondary | ICD-10-CM | POA: Diagnosis not present

## 2018-12-08 NOTE — Telephone Encounter (Signed)
Please inform patient the following information: - Thoracic or mid back: Mild arthritis changes on several levels including some bone spurs visualized. - Lumbar or lower back: She has significant arthritic changes on several levels of her lower back including narrowing of the disc space, which typically means some mild disc protrusions.  - try the tramadol prescribed and on follow up we will discuss in more detail  Her bone density resulted with osteopenia. She has been unable to tolerate oral mediations for this in the past by her prior allergy list. We will discuss management at her upcoming appt.

## 2018-12-09 NOTE — Telephone Encounter (Signed)
Pt was called and given information, she verbalized understanding  

## 2018-12-10 NOTE — Telephone Encounter (Signed)
Pt was called and stated medication was working but felt the old medication, hydrocodone worked better. She has appt 12/17/2018 and will speak with Dr Raoul Pitch about which medication would be the best option for her. She agreed.

## 2018-12-11 ENCOUNTER — Ambulatory Visit (INDEPENDENT_AMBULATORY_CARE_PROVIDER_SITE_OTHER): Payer: Medicare Other | Admitting: *Deleted

## 2018-12-11 ENCOUNTER — Other Ambulatory Visit: Payer: Self-pay

## 2018-12-11 DIAGNOSIS — I4891 Unspecified atrial fibrillation: Secondary | ICD-10-CM

## 2018-12-11 DIAGNOSIS — Z7901 Long term (current) use of anticoagulants: Secondary | ICD-10-CM | POA: Diagnosis not present

## 2018-12-11 LAB — POCT INR: INR: 2.6 (ref 2.0–3.0)

## 2018-12-11 NOTE — Patient Instructions (Signed)
Description   Continue on same dosage 1 tablet everyday. Recheck INR in 8 weeks. # [phone] Coumadin clinic. Main # [phone].      

## 2018-12-17 ENCOUNTER — Encounter: Payer: Self-pay | Admitting: Family Medicine

## 2018-12-17 ENCOUNTER — Other Ambulatory Visit: Payer: Self-pay

## 2018-12-17 ENCOUNTER — Ambulatory Visit (INDEPENDENT_AMBULATORY_CARE_PROVIDER_SITE_OTHER): Payer: Medicare Other | Admitting: Family Medicine

## 2018-12-17 ENCOUNTER — Telehealth: Payer: Self-pay | Admitting: Family Medicine

## 2018-12-17 VITALS — BP 162/100 | HR 67 | Temp 98.0°F | Resp 16 | Ht 64.0 in | Wt 204.2 lb

## 2018-12-17 DIAGNOSIS — M8580 Other specified disorders of bone density and structure, unspecified site: Secondary | ICD-10-CM

## 2018-12-17 DIAGNOSIS — M47816 Spondylosis without myelopathy or radiculopathy, lumbar region: Secondary | ICD-10-CM | POA: Diagnosis not present

## 2018-12-17 DIAGNOSIS — E213 Hyperparathyroidism, unspecified: Secondary | ICD-10-CM

## 2018-12-17 DIAGNOSIS — M4316 Spondylolisthesis, lumbar region: Secondary | ICD-10-CM

## 2018-12-17 DIAGNOSIS — M1990 Unspecified osteoarthritis, unspecified site: Secondary | ICD-10-CM | POA: Diagnosis not present

## 2018-12-17 DIAGNOSIS — M47814 Spondylosis without myelopathy or radiculopathy, thoracic region: Secondary | ICD-10-CM

## 2018-12-17 DIAGNOSIS — N1831 Chronic kidney disease, stage 3a: Secondary | ICD-10-CM

## 2018-12-17 LAB — BASIC METABOLIC PANEL
BUN: 15 mg/dL (ref 6–23)
CO2: 26 mEq/L (ref 19–32)
Calcium: 9.2 mg/dL (ref 8.4–10.5)
Chloride: 103 mEq/L (ref 96–112)
Creatinine, Ser: 0.76 mg/dL (ref 0.40–1.20)
GFR: 74.13 mL/min (ref >60.00)
Glucose, Bld: 94 mg/dL (ref 70–99)
Potassium: 4.1 mEq/L (ref 3.5–5.1)
Sodium: 138 mEq/L (ref 135–145)

## 2018-12-17 MED ORDER — HYDROCODONE-ACETAMINOPHEN 5-325 MG PO TABS: 1.0000 | ORAL_TABLET | Freq: Three times a day (TID) | ORAL | 0 refills | Status: DC | PRN

## 2018-12-17 NOTE — Patient Instructions (Addendum)
Please call me and let me know the name of the endocrine you have an appt with. If they do not do the reclast (for osteopenia) we will also refer you to a specialist that does.   I have referred you to orthopedics for your back to discuss options.  I have called in hydrocodone- follow up in 10 days.   If kidney function still lower than your normal on repeat today- we will discuss additional labs to find out cause.

## 2018-12-17 NOTE — Telephone Encounter (Signed)
Patient's daughter left a VM on the front desk stating that patient has an appointment for a mammogram not with an endocrinologist. No further information was given on the VM.

## 2018-12-17 NOTE — Progress Notes (Signed)
Jennifer House , 1946-03-01, 73 y.o., female MRN: [id number] Patient Care Team    Relationship Specialty Notifications Start End  Ma Hillock, DO PCP - General Family Medicine  01/23/15   Thompson Grayer, MD Attending Physician Cardiology  07/25/11   Ronald Lobo, MD Consulting Physician Gastroenterology  08/25/15   Rana Snare, MD Consulting Physician Urology  08/25/15   Renelda Loma, OD  Optometry  08/25/15    Comment: "kimberly" at [facility]    Chief Complaint  Patient presents with  . Follow-up    patient wants to discuss bone density test and x ray results.   . Back Pain    pain medication is not working. she stated that her hydrocodone worked better.     Subjective:Jennifer House is a 73 y.o. female present for follow-up on multiple complaints.  Back pain: Discussed results of her x-rays which showed arthritis in the thoracic and lumbar spine, most significantly in the lumbar spine.  She was tried on tramadol secondary to NSAID contraindication on chronic anticoagulation.  Patient reports tramadol was not very effective.  She has been on Vicodin in the past and states that was more effective.  She has a history of lumbar surgery many years ago.  Hyperparathyroidism/osteopenia: Discussed results with her today vitamin D 30.19, bone density with significant osteopenia -2.2.  PTH 138.  She has been referred to endocrinology to discuss further, and reports she has an appointment for the beginning of November.  Patient has been unable to tolerate bisphosphonates in the past.  CKD 3: Patient's creatinine increased to 1.21 with a GFR of 43.  This is new for her and has always had normal creatinine and kidney function up until June 2018. Patient is not taking any NSAIDs secondary to anticoagulation.  Patient did have a significant medical history of bladder cancer and kidney stones. Prior note: Pt presents for an OV with complaints of back pain of 2 weeks  duration.  Associated  symptoms include muscles of arms and legs hurt all the time and her bones hurt.  She complains of back pain both thoracic and lumbar spine.  She states her back pain is worsened with movement.  She denies any bladder or bowel changes or dysfunction. She has a known significant history of osteopenia, she was intolerant to Boniva secondary to bone pain.  She has a history of lumbar fusion L3-L4.  She also has a history of renal calculus and bladder cancer.  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  She reports her muscles and bones just feels fatigued and uncomfortable all the time for the past 2 weeks.  Pt has tried OTC pain reflief products to ease their symptoms.   Dg Thoracic Spine W/swimmers Result Date: 12/08/2018  IMPRESSION: No fracture or spondylolisthesis. Relatively mild osteoarthritic change at several levels. Electronically Signed   By: Lowella Grip III M.D.   On: 12/08/2018 13:53   Dg Lumbar Spine Complete Result Date: 12/08/2018 IMPRESSION: Osteoarthritic change at several levels, most notably at L4-5. Slight spondylolisthesis at L3-4 is felt to be due to underlying spondylosis. No other spondylolisthesis. No fracture. Aortic Atherosclerosis (ICD10-I70.0).     Depression screen [facility] 2/9 06/02/2018 01/12/2018 09/09/2017 04/30/2017 03/26/2017  Decreased Interest 0 0 0 0 0  Down, Depressed, Hopeless 0 0 0 0 0  PHQ - 2 Score 0 0 0 0 0  Altered sleeping 0 0 - - 0  Tired, decreased energy 0 1 - - 3  Change in appetite 0 0 - - 0  Feeling bad or failure about yourself  0 0 - - 0  Trouble concentrating 0 1 - - 0  Moving slowly or fidgety/restless 0 0 - - 3  Suicidal thoughts 0 0 - - 0  PHQ-9 Score 0 2 - - 6  Difficult doing work/chores Not difficult at all Not difficult at all - - Not difficult at all    Allergies  Allergen Reactions  . Ibandronate Sodium Other (See Comments)    Bones hurt  . Pravachol [Pravastatin Sodium]  Other (See Comments)    Myalgias   . Nsaids     anticoagulated  . Ultram [Tramadol Hcl] Nausea Only   Social History   Social History Narrative   Lives in [place], with husband Eddie Dibbles.   Has 4 adult children , highest level of education is seventh grade.   Former smoker, denies recreational drugs or alcohol use.   Patient drink caffeine beverages, takes a daily vitamin   Patient was her seatbelt , she has dentures , she has a smoke detector in the home , there are guns in the home a  Locked case    Patient feels safe in her relationships       Past Medical History:  Diagnosis Date  . Anxiety   . Atrial fibrillation ([place])    longstanding persistent  . Carcinoma of bladder ([place]) 2000   transitional cell hx; Dr. Risa Grill  . Colon polyps   . Depression   . GERD (gastroesophageal reflux disease)   . Headache(784.0)    hx of migraines  . Hematuria    hx  . HLD (hyperlipidemia)    mixed  . HTN (hypertension)   . Hypothyroidism   . Insomnia   . Liver function test abnormality    hx fatty liver  . Murmur, cardiac   . Nonalcoholic fatty liver disease   . Obesity   . Osteoarthritis    low back pain with left sciatica  . Osteopenia    dexa 2013  . Renal calculus    hx  . Restless leg syndrome    Past Surgical History:  Procedure Laterality Date  . APPENDECTOMY    . CARDIAC CATHETERIZATION  1/12   revealed normal cors and LV function  . COLONOSCOPY    . cystocopy  10/17/03   left stent placement, TURBT  . FOOT SURGERY    . LUMBAR LAMINECTOMY/DECOMPRESSION MICRODISCECTOMY N/A 01/19/2013   Procedure: LUMBAR THREE FOUR LUMBAR LAMINECTOMY/DECOMPRESSION MICRODISCECTOMY 1 LEVEL;  Surgeon: Faythe Ghee, MD;  Location: [facility];  Service: Neurosurgery;  Laterality: N/A;  . TOTAL ABDOMINAL HYSTERECTOMY  1985  . TOTAL HIP ARTHROPLASTY Left 08/28/2017   Procedure: TOTAL HIP ARTHROPLASTY ANTERIOR APPROACH;  Surgeon: Rod Can, MD;  Location: [facility];  Service: Orthopedics;   Laterality: Left;   Family History  Problem Relation Age of Onset  . Heart failure Mother   . Heart attack Mother   . Hypertension Mother   . Thyroid cancer Mother   . Stroke Father   . Hypertension Father   . Stroke Brother   . Breast cancer Neg Hx   . Colon cancer Neg Hx    Allergies as of 12/17/2018      Reactions   Ibandronate Sodium Other (See Comments)   Bones hurt   Pravachol [pravastatin Sodium] Other (See Comments)   Myalgias   Nsaids  anticoagulated   Ultram [tramadol Hcl] Nausea Only      Medication List       Accurate as of December 17, 2018  9:08 AM. If you have any questions, ask your nurse or doctor.        amLODipine 10 MG tablet Commonly known as: NORVASC Take 1 tablet (10 mg total) by mouth daily.   benazepril 20 MG tablet Commonly known as: LOTENSIN Take 1.5 tablets (30 mg total) by mouth daily. Needs office visit prior to anymore refills.   Caltrate 600 1500 (600 Ca) MG Tabs tablet Generic drug: calcium carbonate Take 3 tablets by mouth daily.   Centrum Silver tablet Take 1 tablet by mouth daily.   cholecalciferol 1000 units tablet Commonly known as: VITAMIN D Take 1,000 Units by mouth daily.   escitalopram 20 MG tablet Commonly known as: Lexapro Take 1 tablet (20 mg total) by mouth daily.   ezetimibe 10 MG tablet Commonly known as: ZETIA Take 1 tablet (10 mg total) by mouth daily. Needs office visit prior to any additional refills.   furosemide 20 MG tablet Commonly known as: LASIX TAKE 1 TABLET BY MOUTH EVERY MONDAY, WEDNESDAY AND FRIDAY   levothyroxine 75 MCG tablet Commonly known as: SYNTHROID TAKE 1 TABLET DAILY   metoprolol tartrate 50 MG tablet Commonly known as: LOPRESSOR Take 0.5 tablets (25 mg total) by mouth 2 (two) times daily.   nitroGLYCERIN 0.4 MG SL tablet Commonly known as: NITROSTAT PLACE 1 TABLET UNDER THE TONGUE EVERY 5 MINUTES AS NEEDED FOR CHEST PAIN,(MAX 3 TABLETS)   polyethylene glycol 17 g packet  Commonly known as: MIRALAX / GLYCOLAX Take 17 g by mouth daily as needed for mild constipation or moderate constipation.   senna-docusate 8.6-50 MG tablet Commonly known as: Senokot S Take 2 tablets by mouth at bedtime.   traZODone 100 MG tablet Commonly known as: DESYREL Take 1 tablet (100 mg total) by mouth at bedtime.   vitamin B-12 1000 MCG tablet Commonly known as: CYANOCOBALAMIN Take 1,000 mcg by mouth daily.   warfarin 5 MG tablet Commonly known as: COUMADIN Take as directed by the anticoagulation clinic. If you are unsure how to take this medication, talk to your nurse or doctor. Original instructions: TAKE AS DIRECTED BY COUMADIN CLINIC       All past medical history, surgical history, allergies, family history, immunizations andmedications were updated in the EMR today and reviewed under the history and medication portions of their EMR.     ROS: Negative, with the exception of above mentioned in HPI   Objective:  BP (!) 148/79 (BP Location: Left Arm, Patient Position: Sitting, Cuff Size: Large)   Pulse 67   Temp 98 F (36.7 C) (Temporal)   Resp 16   Ht 5\' 4"  (1.626 m)   Wt 204 lb 4 oz (92.6 kg)   SpO2 97%   BMI 35.06 kg/m  Body mass index is 35.06 kg/m. Gen: Afebrile. No acute distress.  HENT: AT. [place].  Eyes:Pupils Equal Round Reactive to light, Extraocular movements intact,  Conjunctiva without redness, discharge or icterus. CV: RRR  Neuro:  Normal gait. PERLA. EOMi. Alert. Oriented x3  Psych: Normal affect, dress and demeanor. Normal speech. Normal thought content and judgment.   Assessment/Plan: Jennifer House is a 73 y.o. female present for OV for  Stage 3a chronic kidney disease Discussed changes in her kidney function with her today in detail.  No known cause for the recent changes in function. -Her blood  pressure is elevated today, suspect secondary to pain and nervousness secondary to pain.  Will monitor closely at home and at her follow-up  appointment here. - Basic Metabolic Panel (BMET) - Consider SPEP/UPEP and ultrasound if repeat kidney function warrants.  Osteopenia, unspecified location Discussed options with her today as well as her results.  She is not a candidate for bisphosphonates secondary to intolerance.  She is not a candidate for Forteo secondary to elevated parathyroid hormone. Could consider Reclast, however uncertain if she would have same reaction of bone pain giving different format of medication.  She has been referred to endocrinology for her hyperparathyroidism.  I have asked them to check to see which endocrinologist she had been referred so that we can also see if they would consider managing if reclast appropriate.  Hyperparathyroidism ([place]) Has been referred to endocrine for further evaluation.  Arthritis, lumbar spine/Osteoarthritis of thoracic spine, unspecified spinal osteoarthritis complication status/Spondylolisthesis at L3-L4 level Discussed options with patient and her daughter today.  Tramadol was not effective.  Advised them controlled substances are addictive and not ideal for pain control unless absolutely needed.  Unfortunately she is unable to be prescribed NSAIDs secondary to chronic anticoagulation. DC tramadol. Hydrocodone 5-325 mg every 8 hours as needed prescribed.  Patient is aware to take as least amount of medication as possible.  If desiring long-term prescription will need to follow-up in 7 to 10 days to discuss chronic prescription.  [place] controlled substance database reviewed today. Lengthy discussion surrounding referrals to orthopedic to consider further evaluation and see if she is a candidate for injections to help with her low back pain. Once lab result returns we will also add gabapentin to her regimen/renally dosed. F/u 1 week   Reviewed expectations re: course of current medical issues.  Discussed self-management of symptoms.  Outlined signs and symptoms  indicating need for more acute intervention.  Patient verbalized understanding and all questions were answered.  Patient received an After-Visit Summary.    No orders of the defined types were placed in this encounter.  > 25 minutes spent with patient, >50% of time spent face to face     Note is dictated utilizing voice recognition software. Although note has been proof read prior to signing, occasional typographical errors still can be missed. If any questions arise, please do not hesitate to call for verification.   electronically signed by:  Howard Pouch, DO  [place]

## 2018-12-18 MED ORDER — GABAPENTIN 100 MG PO CAPS: ORAL_CAPSULE | ORAL | 2 refills | Status: DC

## 2018-12-18 NOTE — Telephone Encounter (Signed)
I saw in the last office note that you wanted to know what endo doctor she has a visit with. Patient doesn't have an appointment with one. Just for her mamm.

## 2018-12-18 NOTE — Telephone Encounter (Signed)
Called patient and she verbalized understanding of recommendations

## 2018-12-18 NOTE — Telephone Encounter (Signed)
Please inform patient the following information: - She should be receiving a call from endocrine to est. And discuss her hyperparathyroid and osteopenia. If she does not hear from someone to schedule within the next week please ask her to call back and let us know.   Her kidney fx has returned to normal. No further studies needed.  - since she has normal kidney function, I have also  Added a medication called gabapentin to help with her back pain. This medicine works on nerve pain and should be taken daily. Start 2 tabs before bed for 1 week, then add 1 tab in the morning. This can be tapered up if needed on follow up.  The Vicodin called in yesterday is written as TID PRN. She only use if needed. If needed once a day then only use once a day etc.  Follow up in 7-10 days, if not already scheduled.

## 2018-12-23 ENCOUNTER — Other Ambulatory Visit: Payer: Self-pay

## 2018-12-23 ENCOUNTER — Encounter: Payer: Self-pay | Admitting: Family Medicine

## 2018-12-23 ENCOUNTER — Ambulatory Visit (INDEPENDENT_AMBULATORY_CARE_PROVIDER_SITE_OTHER): Payer: Medicare Other | Admitting: Family Medicine

## 2018-12-23 VITALS — BP 134/80 | HR 66 | Temp 97.3°F | Resp 18 | Ht 64.0 in | Wt 204.2 lb

## 2018-12-23 DIAGNOSIS — M4316 Spondylolisthesis, lumbar region: Secondary | ICD-10-CM

## 2018-12-23 DIAGNOSIS — M47816 Spondylosis without myelopathy or radiculopathy, lumbar region: Secondary | ICD-10-CM | POA: Diagnosis not present

## 2018-12-23 DIAGNOSIS — M1990 Unspecified osteoarthritis, unspecified site: Secondary | ICD-10-CM

## 2018-12-23 DIAGNOSIS — G8929 Other chronic pain: Secondary | ICD-10-CM | POA: Insufficient documentation

## 2018-12-23 MED ORDER — HYDROCODONE-ACETAMINOPHEN 5-325 MG PO TABS: 1.0000 | ORAL_TABLET | Freq: Two times a day (BID) | ORAL | 0 refills | Status: DC | PRN

## 2018-12-23 MED ORDER — GABAPENTIN 100 MG PO CAPS: ORAL_CAPSULE | ORAL | 1 refills | Status: DC

## 2018-12-23 NOTE — Progress Notes (Signed)
Jennifer House , 1945-05-12, 73 y.o., female MRN: [id number] Patient Care Team    Relationship Specialty Notifications Start End  Ma Hillock, DO PCP - General Family Medicine  01/23/15   Thompson Grayer, MD Attending Physician Cardiology  07/25/11   Ronald Lobo, MD Consulting Physician Gastroenterology  08/25/15   Rana Snare, MD Consulting Physician Urology  08/25/15   Renelda Loma, OD  Optometry  08/25/15    Comment: "kimberly" at [facility]    Chief Complaint  Patient presents with   Follow-up    Pt states the pills have been helping with pain and she is here for the 7-10 F/U appt      Subjective:Jennifer House is a 73 y.o. female present for follow-up on multiple complaints. Encounter for chronic pain/lumbar arthritis: tramadol not effective. Vicodin has helped a great deal. She is mostly on needing to take it before bed. She is tolerating the gabapentin addition.  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  Prior note:  Discussed results with her today vitamin D 30.19, bone density with significant osteopenia -2.2.  PTH 138.  She has been referred to endocrinology to discuss further, and reports she has an appointment for the beginning of November.  Patient has been unable to tolerate bisphosphonates in the past.  CKD 3: Resolved on repeat.  Prior note:  Patient's creatinine increased to 1.21 with a GFR of 43.  This is new for her and has always had normal creatinine and kidney function up until June 2018. Patient is not taking any NSAIDs secondary to anticoagulation.  Patient did have a significant medical history of  bladder cancer and kidney stones. Prior note: Pt presents for an OV with complaints of back pain of 2 weeks  duration.  Associated symptoms include muscles of arms and legs hurt all the time and her bones hurt.  She complains of back pain both thoracic and lumbar spine.  She states her back pain is worsened with movement.  She denies any bladder or bowel changes or dysfunction. She has a known significant history of osteopenia, she was intolerant to Boniva secondary to bone pain.  She has a history of lumbar fusion L3-L4.  She also has a history of renal calculus and bladder cancer.  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  She reports her muscles and bones just feels fatigued and uncomfortable all the time for the past 2 weeks.  Pt has tried OTC pain reflief products to ease their symptoms.   Dg Thoracic Spine W/swimmers Result Date: 12/08/2018  IMPRESSION: No fracture or spondylolisthesis. Relatively mild osteoarthritic change at several levels. Electronically Signed   By: Lowella Grip III M.D.   On: 12/08/2018 13:53   Dg Lumbar Spine Complete Result Date: 12/08/2018 IMPRESSION: Osteoarthritic change at several levels, most notably at L4-5. Slight spondylolisthesis at L3-4 is felt to be due to underlying spondylosis. No other spondylolisthesis. No fracture. Aortic Atherosclerosis (ICD10-I70.0).     Depression screen [facility] 2/9 06/02/2018 01/12/2018 09/09/2017 04/30/2017 03/26/2017  Decreased Interest 0 0 0 0 0  Down, Depressed, Hopeless 0 0 0 0 0  PHQ - 2 Score 0 0 0 0 0  Altered sleeping 0 0 - - 0  Tired, decreased energy 0 1 - - 3  Change in appetite 0 0 - - 0  Feeling bad or failure about yourself  0 0 - - 0  Trouble concentrating 0 1 - - 0  Moving slowly or fidgety/restless 0 0 - - 3  Suicidal thoughts 0 0 - - 0  PHQ-9 Score 0 2 - - 6  Difficult doing work/chores Not difficult at all Not difficult at all - - Not difficult at all      Allergies  Allergen Reactions   Ibandronate Sodium Other (See Comments)    Bones hurt   Pravachol [Pravastatin Sodium] Other (See Comments)    Myalgias    Nsaids     anticoagulated   Ultram [Tramadol Hcl] Nausea Only   Social History   Social History Narrative   Lives in [place], with husband [place].   Has 4 adult children , highest level of education is seventh grade.   Former smoker, denies recreational drugs or alcohol use.   Patient drink caffeine beverages, takes a daily vitamin   Patient was her seatbelt , she has dentures , she has a smoke detector in the home , there are guns in the home a  Locked case    Patient feels safe in her relationships       Past Medical History:  Diagnosis Date   Anxiety    Atrial fibrillation ([place])    longstanding persistent   Carcinoma of bladder ([place]) 2000   transitional cell hx; Dr. Risa Grill   Colon polyps    Depression    GERD (gastroesophageal reflux disease)    Headache(784.0)    hx of migraines   Hematuria    hx   HLD (hyperlipidemia)    mixed   HTN (hypertension)    Hypothyroidism    Insomnia    Liver function test abnormality    hx fatty liver   Murmur, cardiac    Nonalcoholic fatty liver disease    Obesity    Osteoarthritis    low back pain with left sciatica   Osteopenia    dexa 2013   Renal calculus    hx   Restless leg syndrome    Past Surgical History:  Procedure Laterality Date   APPENDECTOMY     CARDIAC CATHETERIZATION  1/12   revealed normal cors and LV function   COLONOSCOPY     cystocopy  10/17/03   left stent placement, TURBT   FOOT SURGERY     LUMBAR LAMINECTOMY/DECOMPRESSION MICRODISCECTOMY N/A 01/19/2013   Procedure: LUMBAR THREE FOUR LUMBAR LAMINECTOMY/DECOMPRESSION MICRODISCECTOMY 1 LEVEL;  Surgeon: Faythe Ghee, MD;  Location: [facility];  Service: Neurosurgery;  Laterality: N/A;   TOTAL ABDOMINAL HYSTERECTOMY  1985   TOTAL HIP ARTHROPLASTY Left  08/28/2017   Procedure: TOTAL HIP ARTHROPLASTY ANTERIOR APPROACH;  Surgeon: Rod Can, MD;  Location: [place];  Service: Orthopedics;  Laterality: Left;   Family History  Problem Relation Age of Onset   Heart failure Mother    Heart attack Mother    Hypertension Mother    Thyroid cancer Mother    Stroke Father    Hypertension Father    Stroke Brother    Breast cancer Neg Hx    Colon cancer Neg Hx    Allergies as of 12/23/2018      Reactions   Ibandronate Sodium Other (See Comments)  Bones hurt   Pravachol [pravastatin Sodium] Other (See Comments)   Myalgias   Nsaids    anticoagulated   Ultram [tramadol Hcl] Nausea Only      Medication List       Accurate as of December 23, 2018  1:28 PM. If you have any questions, ask your nurse or doctor.        amLODipine 10 MG tablet Commonly known as: NORVASC Take 1 tablet (10 mg total) by mouth daily.   benazepril 20 MG tablet Commonly known as: LOTENSIN Take 1.5 tablets (30 mg total) by mouth daily. Needs office visit prior to anymore refills.   Caltrate 600 1500 (600 Ca) MG Tabs tablet Generic drug: calcium carbonate Take 3 tablets by mouth daily.   Centrum Silver tablet Take 1 tablet by mouth daily.   cholecalciferol 1000 units tablet Commonly known as: VITAMIN D Take 1,000 Units by mouth daily.   escitalopram 20 MG tablet Commonly known as: Lexapro Take 1 tablet (20 mg total) by mouth daily.   ezetimibe 10 MG tablet Commonly known as: ZETIA Take 1 tablet (10 mg total) by mouth daily. Needs office visit prior to any additional refills.   furosemide 20 MG tablet Commonly known as: LASIX TAKE 1 TABLET BY MOUTH EVERY MONDAY, WEDNESDAY AND FRIDAY   gabapentin 100 MG capsule Commonly known as: NEURONTIN 2 caps QHS for 1 week, then add 1 cap in morning   HYDROcodone-acetaminophen 5-325 MG tablet Commonly known as: NORCO/VICODIN Take 1 tablet by mouth every 8 (eight) hours as needed for moderate pain.    levothyroxine 75 MCG tablet Commonly known as: SYNTHROID TAKE 1 TABLET DAILY   metoprolol tartrate 50 MG tablet Commonly known as: LOPRESSOR Take 0.5 tablets (25 mg total) by mouth 2 (two) times daily.   nitroGLYCERIN 0.4 MG SL tablet Commonly known as: NITROSTAT PLACE 1 TABLET UNDER THE TONGUE EVERY 5 MINUTES AS NEEDED FOR CHEST PAIN,(MAX 3 TABLETS)   traZODone 100 MG tablet Commonly known as: DESYREL Take 1 tablet (100 mg total) by mouth at bedtime.   vitamin B-12 1000 MCG tablet Commonly known as: CYANOCOBALAMIN Take 1,000 mcg by mouth daily.   warfarin 5 MG tablet Commonly known as: COUMADIN Take as directed by the anticoagulation clinic. If you are unsure how to take this medication, talk to your nurse or doctor. Original instructions: TAKE AS DIRECTED BY COUMADIN CLINIC       All past medical history, surgical history, allergies, family history, immunizations andmedications were updated in the EMR today and reviewed under the history and medication portions of their EMR.     ROS: Negative, with the exception of above mentioned in HPI   Objective:  BP 134/80 (BP Location: Right Arm, Patient Position: Sitting, Cuff Size: Normal)    Pulse 66    Temp (!) 97.3 F (36.3 C) (Temporal)    Resp 18    Ht 5\' 4"  (1.626 m)    Wt 204 lb 4 oz (92.6 kg)    SpO2 97%    BMI 35.06 kg/m  Body mass index is 35.06 kg/m. Gen: Afebrile. No acute distress. Pt appears much more comfortable today HENT: AT. [place].  Eyes:Pupils Equal Round Reactive to light, Extraocular movements intact,  Conjunctiva without redness, discharge or icterus. CV: RRR  Chest: CTAB, no wheeze or crackles Skin: no rashes, purpura or petechiae.  Neuro:  Normal gait. PERLA. EOMi. Alert. Oriented x3  Psych: Normal affect, dress and demeanor. Normal speech. Normal thought content and  judgment.  Assessment/Plan: Jennifer House is a 73 y.o. female present for OV for  Stage 3a chronic kidney  disease resolved  Osteopenia, unspecified location Discussed options with her today as well as her results.  She is not a candidate for bisphosphonates secondary to intolerance.  She is not a candidate for Forteo secondary to elevated parathyroid hormone. Could consider Reclast, however uncertain if she would have same reaction of bone pain giving different format of medication.   She has been referred to endocrinology   Hyperparathyroidism [facility]) Has been referred to endocrine for further evaluation. They are waiting to hear from that office to schedule.   Arthritis, lumbar spine/Osteoarthritis of thoracic spine, unspecified spinal osteoarthritis complication status/Spondylolisthesis at L3-L4 level/Encounter for chronic pain - Pain improved with chronic narcotic. Not ideal to continue chronic narcotic if able to offer her more pain control through ortho/inj etc. She is waiting on ortho call back.  - Chronic pain script: Hydrocodone 5-325 mg BID PRn for chronic back/arthritis pain.    - UDS collected today - contract signed today - [place] controlled substance database reviewed 12/23/18 -  Gabapentin 100 mg 1 tab morning and 2 tabs before bed. - she is tolerating.  - f/u 3 mos.    Reviewed expectations re: course of current medical issues.  Discussed self-management of symptoms.  Outlined signs and symptoms indicating need for more acute intervention.  Patient verbalized understanding and all questions were answered.  Patient received an After-Visit Summary.   No orders of the defined types were placed in this encounter.   Note is dictated utilizing voice recognition software. Although note has been proof read prior to signing, occasional typographical errors still can be missed. If any questions arise, please do not hesitate to call for verification.   electronically signed by:  Howard Pouch, DO  [place]

## 2018-12-23 NOTE — Patient Instructions (Signed)
I have called in your medicine for chronic pain control.  We will need to follow you every 3 mos face to face for this condition if desiring refills. Make sure to schedule the appt for refill when you start last bottle (of three) so we have enough time to work you in the schedule.

## 2018-12-25 LAB — PAIN MGMT, PROFILE 8 W/CONF, U
6 Acetylmorphine: NEGATIVE ng/mL
Alcohol Metabolites: NEGATIVE ng/mL (ref <500)
Amphetamines: NEGATIVE ng/mL
Benzodiazepines: NEGATIVE ng/mL
Buprenorphine, Urine: NEGATIVE ng/mL
Cocaine Metabolite: NEGATIVE ng/mL
Codeine: NEGATIVE ng/mL
Creatinine: 38.7 mg/dL
Hydrocodone: 61 ng/mL
Hydromorphone: NEGATIVE ng/mL
MDMA: NEGATIVE ng/mL
Marijuana Metabolite: NEGATIVE ng/mL
Morphine: NEGATIVE ng/mL
Norhydrocodone: 129 ng/mL
Opiates: POSITIVE ng/mL
Oxidant: NEGATIVE ug/mL
Oxycodone: NEGATIVE ng/mL
pH: 7.1 (ref 4.5–9.0)

## 2018-12-31 ENCOUNTER — Ambulatory Visit (INDEPENDENT_AMBULATORY_CARE_PROVIDER_SITE_OTHER): Payer: Medicare Other | Admitting: Family Medicine

## 2018-12-31 ENCOUNTER — Other Ambulatory Visit: Payer: Self-pay

## 2018-12-31 ENCOUNTER — Encounter: Payer: Self-pay | Admitting: Family Medicine

## 2018-12-31 VITALS — BP 123/73 | HR 63 | Temp 97.6°F | Resp 16 | Ht 64.0 in | Wt 205.1 lb

## 2018-12-31 DIAGNOSIS — E785 Hyperlipidemia, unspecified: Secondary | ICD-10-CM | POA: Diagnosis not present

## 2018-12-31 DIAGNOSIS — I700 Atherosclerosis of aorta: Secondary | ICD-10-CM

## 2018-12-31 DIAGNOSIS — G4700 Insomnia, unspecified: Secondary | ICD-10-CM

## 2018-12-31 DIAGNOSIS — F331 Major depressive disorder, recurrent, moderate: Secondary | ICD-10-CM

## 2018-12-31 DIAGNOSIS — M8580 Other specified disorders of bone density and structure, unspecified site: Secondary | ICD-10-CM

## 2018-12-31 DIAGNOSIS — I2510 Atherosclerotic heart disease of native coronary artery without angina pectoris: Secondary | ICD-10-CM | POA: Diagnosis not present

## 2018-12-31 DIAGNOSIS — E669 Obesity, unspecified: Secondary | ICD-10-CM | POA: Insufficient documentation

## 2018-12-31 DIAGNOSIS — I4891 Unspecified atrial fibrillation: Secondary | ICD-10-CM | POA: Diagnosis not present

## 2018-12-31 DIAGNOSIS — Z7901 Long term (current) use of anticoagulants: Secondary | ICD-10-CM

## 2018-12-31 DIAGNOSIS — E213 Hyperparathyroidism, unspecified: Secondary | ICD-10-CM

## 2018-12-31 DIAGNOSIS — I10 Essential (primary) hypertension: Secondary | ICD-10-CM

## 2018-12-31 DIAGNOSIS — E559 Vitamin D deficiency, unspecified: Secondary | ICD-10-CM

## 2018-12-31 DIAGNOSIS — E039 Hypothyroidism, unspecified: Secondary | ICD-10-CM

## 2018-12-31 LAB — CBC
HCT: 41.6 % (ref 36.0–46.0)
Hemoglobin: 13.8 g/dL (ref 12.0–15.0)
MCHC: 33.1 g/dL (ref 30.0–36.0)
MCV: 83.4 fl (ref 78.0–100.0)
Platelets: 212 K/uL (ref 150.0–400.0)
RBC: 4.99 Mil/uL (ref 3.87–5.11)
RDW: 13.8 % (ref 11.5–15.5)
WBC: 6 K/uL (ref 4.0–10.5)

## 2018-12-31 LAB — LIPID PANEL
Cholesterol: 227 mg/dL — ABNORMAL HIGH (ref 0–200)
HDL: 50.7 mg/dL (ref >39.00)
LDL Cholesterol: 137 mg/dL — ABNORMAL HIGH (ref 0–99)
NonHDL: 176.5
Total CHOL/HDL Ratio: 4
Triglycerides: 199 mg/dL — ABNORMAL HIGH (ref 0.0–149.0)
VLDL: 39.8 mg/dL (ref 0.0–40.0)

## 2018-12-31 MED ORDER — AMLODIPINE BESYLATE 10 MG PO TABS: 10.0000 mg | ORAL_TABLET | Freq: Every day | ORAL | 0 refills | Status: DC

## 2018-12-31 MED ORDER — ESCITALOPRAM OXALATE 20 MG PO TABS: 20.0000 mg | ORAL_TABLET | Freq: Every day | ORAL | 0 refills | Status: DC

## 2018-12-31 MED ORDER — TRAZODONE HCL 100 MG PO TABS: 100.0000 mg | ORAL_TABLET | Freq: Every day | ORAL | 1 refills | Status: DC

## 2018-12-31 MED ORDER — EZETIMIBE 10 MG PO TABS: 10.0000 mg | ORAL_TABLET | Freq: Every day | ORAL | 3 refills | Status: DC

## 2018-12-31 MED ORDER — METOPROLOL TARTRATE 50 MG PO TABS: 25.0000 mg | ORAL_TABLET | Freq: Two times a day (BID) | ORAL | 1 refills | Status: DC

## 2018-12-31 MED ORDER — BENAZEPRIL HCL 20 MG PO TABS: 30.0000 mg | ORAL_TABLET | Freq: Every day | ORAL | 1 refills | Status: DC

## 2018-12-31 MED ORDER — FUROSEMIDE 20 MG PO TABS: ORAL_TABLET | ORAL | 3 refills | Status: DC

## 2018-12-31 NOTE — Progress Notes (Signed)
Jennifer House , October 18, 1945, 73 y.o., female MRN: [id number] Patient Care Team    Relationship Specialty Notifications Start End  Ma Hillock, DO PCP - General Family Medicine  01/23/15   Thompson Grayer, MD Attending Physician Cardiology  07/25/11   Ronald Lobo, MD Consulting Physician Gastroenterology  08/25/15   Rana Snare, MD Consulting Physician Urology  08/25/15   Renelda Loma, OD  Optometry  08/25/15    Comment: "kimberly" at [facility]    Chief Complaint  Patient presents with  . Hypertension    Pt is doing well with no complaints. Pt checks BP at home and has been WNL.   . Insomnia  . Pain    Subjective: Pt presents for an OV for follow-up chronic medical conditions.   Hypertension/hyperlipidemia/hypertriglyceridemia/A. Fib/aortic atherosclerosis: Pt reports compliance with Benzapril 30 mg, amlodipine 10 mg, metoprolol 25 mg twice a day, Lasix 20 mg (MWF) today. Patient denies chest pain, shortness of breath, dizziness or lower extremity edema.  Pt is on warfarin therapy per cardiology. Pt is prescribed Zetia BMP: 12/2018 WNL CBC: 09/02/2017 Mild anemia, otherwise within normal limits Lipids: 12/2017 WNL- tr 176 TSH 9/20020 WNL PTH/CA- 11/2018 Vit d : 11/2018 Diet: Low-sodium Exercise: Tries to exercise  RF: Hypertension, hyperlipidemia, family history of heart disease and stroke, personal history A. Fib, obesity, former smoker  Depression/insomnia: doing great  on trazodone and lexapro. She needs refills today- no complaints. States she feels really good.  Depression screen [facility] 2/9 12/31/2018 06/02/2018 01/12/2018 09/09/2017 04/30/2017  Decreased Interest 0 0 0 0 0  Down, Depressed, Hopeless 0 0 0 0 0  PHQ - 2 Score 0 0 0 0 0  Altered sleeping - 0 0 - -  Tired, decreased energy - 0 1 - -  Change in appetite - 0 0 - -  Feeling bad or failure about yourself  - 0 0 - -  Trouble concentrating - 0 1 - -  Moving slowly or fidgety/restless - 0 0 - -  Suicidal  thoughts - 0 0 - -  PHQ-9 Score - 0 2 - -  Difficult doing work/chores - Not difficult at all Not difficult at all - -   GAD 7 : Generalized Anxiety Score 12/31/2018 06/02/2018 07/08/2016  Nervous, Anxious, on Edge 0 0 3  Control/stop worrying 0 0 2  Worry too much - different things 3 0 0  Trouble relaxing 0 0 2  Restless 3 0 2  Easily annoyed or irritable 0 0 3  Afraid - awful might happen 0 0 2  Total GAD 7 Score 6 0 14  Anxiety Difficulty Not difficult at all Not difficult at all Somewhat difficult    Allergies  Allergen Reactions  . Ibandronate Sodium Other (See Comments)    Bones hurt  . Pravachol [Pravastatin Sodium] Other (See Comments)    Myalgias   . Nsaids     anticoagulated  . Ultram [Tramadol Hcl] Nausea Only   Social History   Tobacco Use  . Smoking status: Former Smoker    Quit date: 03/18/1985    Years since quitting: 33.8  . Smokeless tobacco: Never Used  . Tobacco comment: denies   Substance Use Topics  . Alcohol use: No   Past Medical History:  Diagnosis Date  . Anxiety   . Atrial fibrillation ([place])    longstanding persistent  . Carcinoma of bladder ([place]) 2000   transitional cell hx; Dr. Risa Grill  . Colon polyps   .  Depression   . GERD (gastroesophageal reflux disease)   . Headache(784.0)    hx of migraines  . Hematuria    hx  . HLD (hyperlipidemia)    mixed  . HTN (hypertension)   . Hypothyroidism   . Insomnia   . Liver function test abnormality    hx fatty liver  . Murmur, cardiac   . Nonalcoholic fatty liver disease   . Obesity   . Osteoarthritis    low back pain with left sciatica  . Osteopenia    dexa 2013  . Renal calculus    hx  . Restless leg syndrome    Past Surgical History:  Procedure Laterality Date  . APPENDECTOMY    . CARDIAC CATHETERIZATION  1/12   revealed normal cors and LV function  . COLONOSCOPY    . cystocopy  10/17/03   left stent placement, TURBT  . FOOT SURGERY    . LUMBAR LAMINECTOMY/DECOMPRESSION  MICRODISCECTOMY N/A 01/19/2013   Procedure: LUMBAR THREE FOUR LUMBAR LAMINECTOMY/DECOMPRESSION MICRODISCECTOMY 1 LEVEL;  Surgeon: Faythe Ghee, MD;  Location: [facility];  Service: Neurosurgery;  Laterality: N/A;  . TOTAL ABDOMINAL HYSTERECTOMY  1985  . TOTAL HIP ARTHROPLASTY Left 08/28/2017   Procedure: TOTAL HIP ARTHROPLASTY ANTERIOR APPROACH;  Surgeon: Rod Can, MD;  Location: [place];  Service: Orthopedics;  Laterality: Left;   Family History  Problem Relation Age of Onset  . Heart failure Mother   . Heart attack Mother   . Hypertension Mother   . Thyroid cancer Mother   . Stroke Father   . Hypertension Father   . Stroke Brother   . Breast cancer Neg Hx   . Colon cancer Neg Hx    Allergies as of 12/31/2018      Reactions   Ibandronate Sodium Other (See Comments)   Bones hurt   Pravachol [pravastatin Sodium] Other (See Comments)   Myalgias   Nsaids    anticoagulated   Ultram [tramadol Hcl] Nausea Only      Medication List       Accurate as of December 31, 2018 11:16 AM. If you have any questions, ask your nurse or doctor.        amLODipine 10 MG tablet Commonly known as: NORVASC Take 1 tablet (10 mg total) by mouth daily.   benazepril 20 MG tablet Commonly known as: LOTENSIN Take 1.5 tablets (30 mg total) by mouth daily. Needs office visit prior to anymore refills.   Caltrate 600 1500 (600 Ca) MG Tabs tablet Generic drug: calcium carbonate Take 3 tablets by mouth daily.   Centrum Silver tablet Take 1 tablet by mouth daily.   cholecalciferol 1000 units tablet Commonly known as: VITAMIN D Take 1,000 Units by mouth daily.   escitalopram 20 MG tablet Commonly known as: Lexapro Take 1 tablet (20 mg total) by mouth daily.   ezetimibe 10 MG tablet Commonly known as: ZETIA Take 1 tablet (10 mg total) by mouth daily. Needs office visit prior to any additional refills.   furosemide 20 MG tablet Commonly known as: LASIX TAKE 1 TABLET BY MOUTH EVERY  MONDAY, WEDNESDAY AND FRIDAY   gabapentin 100 MG capsule Commonly known as: NEURONTIN 1 cap in morning and 2 caps at night   HYDROcodone-acetaminophen 5-325 MG tablet Commonly known as: NORCO/VICODIN Take 1 tablet by mouth 2 (two) times daily as needed for moderate pain.   levothyroxine 75 MCG tablet Commonly known as: SYNTHROID TAKE 1 TABLET DAILY   metoprolol tartrate 50 MG  tablet Commonly known as: LOPRESSOR Take 0.5 tablets (25 mg total) by mouth 2 (two) times daily.   nitroGLYCERIN 0.4 MG SL tablet Commonly known as: NITROSTAT PLACE 1 TABLET UNDER THE TONGUE EVERY 5 MINUTES AS NEEDED FOR CHEST PAIN,(MAX 3 TABLETS)   traZODone 100 MG tablet Commonly known as: DESYREL Take 1 tablet (100 mg total) by mouth at bedtime.   vitamin B-12 1000 MCG tablet Commonly known as: CYANOCOBALAMIN Take 1,000 mcg by mouth daily.   warfarin 5 MG tablet Commonly known as: COUMADIN Take as directed by the anticoagulation clinic. If you are unsure how to take this medication, talk to your nurse or doctor. Original instructions: TAKE AS DIRECTED BY COUMADIN CLINIC       No results found for this or any previous visit (from the past 24 hour(s)). No results found.   ROS: Negative, with the exception of above mentioned in HPI   Objective:  BP 123/73 (BP Location: Right Arm, Patient Position: Sitting, Cuff Size: Normal)   Pulse 63   Temp 97.6 F (36.4 C) (Temporal)   Resp 16   Ht 5\' 4"  (1.626 m)   Wt 205 lb 2 oz (93 kg)   SpO2 97%   BMI 35.21 kg/m  Body mass index is 35.21 kg/m. Gen: Afebrile. No acute distress.  HENT: AT. [place].  Eyes:Pupils Equal Round Reactive to light, Extraocular movements intact,  Conjunctiva without redness, discharge or icterus. Neck/lymp/endocrine: Supple,no lymphadenopathy, no thyromegaly CV: RRR no murmur, no edema, +2/4 P posterior tibialis pulses Chest: CTAB, no wheeze or crackles Abd: Soft. NTND. BS present. no Masses palpated.  Skin: no rashes,  purpura or petechiae.  Neuro:  Normal gait. PERLA. EOMi. Alert. Oriented x3  Psych: Normal affect, dress and demeanor. Normal speech. Normal thought content and judgment.  Assessment/Plan: Jennifer House is a 73 y.o. female present for  OV for  Essential hypertension, benign/hyperlipidemia/A. Fib/ obesity/Atrial fibrillation, unspecified type (HCC)/Aortic atherosclerosis (HCC) - stable. Looks great - Continue Benzapril to 30 mg daily, continue amlodipine 10 , Lasix, metoprolol 25 BID and Zetia at its current doses.  Refills provided today- Continue follow-up with cardiology for atrial fibrillation and coumadin management - low salt diet, increase exercise.  - Continue Coumadin per Coumadin Clinic - lipid and CBC collected today -Follow up 6 months.  Hypothyroid: Stable. TSH UTD. Continue current dose.   Hypocalcemia/Osteopenia, unspecified location - takes vit d 1000u daily. Last dexa 2013 - PTH, Intact and Calcium, vit d every 6 months.  Last collected 11/2018 with normal calcium and rather significant elevation in PTH>> referred to endocrine.   Insomnia, unspecified type/ Moderate episode of recurrent major depressive disorder (HCC) Stable, doing well. Refilled meds today.  - traZODone (DESYREL) 100 MG tablet; Take 1 tablet (100 mg total) by mouth at bedtime.  Dispense: 90 tablet; Refill: 0 - escitalopram (LEXAPRO) 20 MG tablet; Take 1 tablet (20 mg total) by mouth daily.  Dispense: 90 tablet; Refill: 0 -6 months   Orders Placed This Encounter  Procedures  . Lipid panel  . CBC   > 25 minutes spent with patient, >50% of time spent face to face    electronically signed by:  Howard Pouch, DO  [place]

## 2018-12-31 NOTE — Patient Instructions (Signed)
You look great. I am glad you are feeling better.  I have refilled your meds and we will call you when labs are resulted  Your BP looks great! Follow up chronic conditions in 6 months. They will call to get you on the schedule.    Please help [place] help you:  We are honored you have chosen [facility] for your [facility] home. Below you will find basic instructions that you may need to access in the future. Please help [place] help you by reading the instructions, which cover many of the frequent questions we experience.   Prescription refills and request:  -In order to allow more efficient response time, please call your pharmacy for all refills. They will forward the request electronically to [place]. This allows for the quickest possible response. Request left on a nurse line can take longer to refill, since these are checked as time allows between office patients and other phone calls.  - refill request can take up to 3-5 working days to complete.  - If request is sent electronically and request is appropiate, it is usually completed in 1-2 business days.  - all patients will need to be seen routinely for all chronic medical conditions requiring prescription medications (see follow-up below). If you are overdue for follow up on your condition, you will be asked to make an appointment and we will call in enough medication to cover you until your appointment (up to 30 days).  - all controlled substances will require a face to face visit to request/refill.  - if you desire your prescriptions to go through a new pharmacy, and have an active script at original pharmacy, you will need to call your pharmacy and have scripts transferred to new pharmacy. This is completed between the pharmacy locations and not by your provider.    Results: If any images or labs were ordered, it can take up to 1 week to get results depending on the test ordered and the lab/facility running and resulting the test. - Normal  or stable results, which do not need further discussion, may be released to your mychart immediately with attached note to you. A call may not be generated for normal results. Please make certain to sign up for mychart. If you have questions on how to activate your mychart you can call the front office.  - If your results need further discussion, our office will attempt to contact you via phone, and if unable to reach you after 2 attempts, we will release your abnormal result to your mychart with instructions.  - All results will be automatically released in mychart after 1 week.  - Your provider will provide you with explanation and instruction on all relevant material in your results. Please keep in mind, results and labs may appear confusing or abnormal to the untrained eye, but it does not mean they are actually abnormal for you personally. If you have any questions about your results that are not covered, or you desire more detailed explanation than what was provided, you should make an appointment with your provider to do so.   Our office handles many outgoing and incoming calls daily. If we have not contacted you within 1 week about your results, please check your mychart to see if there is a message first and if not, then contact our office.  In helping with this matter, you help decrease call volume, and therefore allow [place] to be able to respond to patients needs more efficiently.  Acute office visits (sick visit):  An acute visit is intended for a new problem and are scheduled in shorter time slots to allow schedule openings for patients with new problems. This is the appropriate visit to discuss a new problem. Problems will not be addressed by phone call or Echart message. Appointment is needed if requesting treatment. In order to provide you with excellent quality medical care with proper time for you to explain your problem, have an exam and receive treatment with instructions, these  appointments should be limited to one new problem per visit. If you experience a new problem, in which you desire to be addressed, please make an acute office visit, we save openings on the schedule to accommodate you. Please do not save your new problem for any other type of visit, let us take care of it properly and quickly for you.   Follow up visits:  Depending on your condition(s) your provider will need to see you routinely in order to provide you with quality care and prescribe medication(s). Most chronic conditions (Example: hypertension, Diabetes, depression/anxiety... etc), require visits a couple times a year. Your provider will instruct you on proper follow up for your personal medical conditions and history. Please make certain to make follow up appointments for your condition as instructed. Failing to do so could result in lapse in your medication treatment/refills. If you request a refill, and are overdue to be seen on a condition, we will always provide you with a 30 day script (once) to allow you time to schedule.    Medicare wellness (well visit): - we have a wonderful Nurse Maudie Mercury), that will meet with you and provide you will yearly medicare wellness visits. These visits should occur yearly (can not be scheduled less than 1 calendar year apart) and cover preventive health, immunizations, advance directives and screenings you are entitled to yearly through your medicare benefits. Do not miss out on your entitled benefits, this is when medicare will pay for these benefits to be ordered for you.  These are strongly encouraged by your provider and is the appropriate type of visit to make certain you are up to date with all preventive health benefits. If you have not had your medicare wellness exam in the last 12 months, please make certain to schedule one by calling the office and schedule your medicare wellness with Maudie Mercury as soon as possible.   Yearly physical (well visit):  - Adults are  recommended to be seen yearly for physicals. Check with your insurance and date of your last physical, most insurances require one calendar year between physicals. Physicals include all preventive health topics, screenings, medical exam and labs that are appropriate for gender/age and history. You may have fasting labs needed at this visit. This is a well visit (not a sick visit), new problems should not be covered during this visit (see acute visit).  - Pediatric patients are seen more frequently when they are younger. Your provider will advise you on well child visit timing that is appropriate for your their age. - This is not a medicare wellness visit. Medicare wellness exams do not have an exam portion to the visit. Some medicare companies allow for a physical, some do not allow a yearly physical. If your medicare allows a yearly physical you can schedule the medicare wellness with our nurse Maudie Mercury and have your physical with your provider after, on the same day. Please check with insurance for your full benefits.   Late Policy/No Shows:  -  all new patients should arrive 15-30 minutes earlier than appointment to allow [place] time  to  obtain all personal demographics,  insurance information and for you to complete office paperwork. - All established patients should arrive 10-15 minutes earlier than appointment time to update all information and be checked in .  - In our best efforts to run on time, if you are late for your appointment you will be asked to either reschedule or if able, we will work you back into the schedule. There will be a wait time to work you back in the schedule,  depending on availability.  - If you are unable to make it to your appointment as scheduled, please call 24 hours ahead of time to allow [place] to fill the time slot with someone else who needs to be seen. If you do not cancel your appointment ahead of time, you may be charged a no show fee.

## 2019-01-01 ENCOUNTER — Telehealth: Payer: Self-pay | Admitting: Family Medicine

## 2019-01-01 MED ORDER — ATORVASTATIN CALCIUM 20 MG PO TABS: 20.0000 mg | ORAL_TABLET | Freq: Every day | ORAL | 3 refills | Status: DC

## 2019-01-01 NOTE — Telephone Encounter (Signed)
lipitor prescribed. Please make pt aware

## 2019-01-01 NOTE — Telephone Encounter (Signed)
Please inform patient the following information: - her blood counts are normal.  Her cholesterol panel is higher than last year with an increase in her total cholesterol from 182, now 227.  And an increase in her LDL/bad cholesterol from 97, now 137 and an increase in her triglycerides from 176, now 199. - Her history states she has been intolerant to pravastatin in the past, with myalgia. Does she recall ever being any other statin?     - If one statin causes myalgia- it does not mean the entire class of medicine will cause myalgia and routinely a different statin is tolerated  Without side effect.      - She would benefit from adding a different statin to her medication regimen, to not only decrease her cholesterol, but it will also provided her cardiovascular protection, which is protection from stroke or heart attack>>since her stroke/MI chance over the next 10 years is significantly increased at 21.2% with her age, HTN and cholesterol levels.    1. If she has tried other statins, please find out which ones.  2. If she is agreeable to start a statin I will call it for her and we will recheck her levels on her next appt for [facility].  3. I would recommend a mediterranean diet and regular exercise (at least 150 min a week with help).  A mediterranean diet is high in fruits, vegetables, whole grains, fish, chicken, nuts, healthy fats (olive oil or canola oil). Low fat dairy. There are many online resources and books on this diet. Limit butter, margarine, red meat and sweets.  4. Lastly adding a fiber supplement such as metamucil will also help improve her cholesterol panel.

## 2019-01-01 NOTE — Telephone Encounter (Signed)
Pt was in the office with husband, lab results were reviewed. Patient states she does not remember if she has tried any other medications before but is willing to try one. [facility]. Pt verbalized understanding and will call if there are any side effects with medication

## 2019-01-01 NOTE — Addendum Note (Signed)
Addended by: Howard Pouch A on: 01/01/2019 12:53 PM   Modules accepted: Orders

## 2019-01-04 ENCOUNTER — Other Ambulatory Visit: Payer: Self-pay

## 2019-01-04 MED ORDER — LEVOTHYROXINE SODIUM 75 MCG PO TABS: 75.0000 ug | ORAL_TABLET | Freq: Every day | ORAL | 3 refills | Status: DC

## 2019-01-04 NOTE — Telephone Encounter (Signed)
RF request for Levothyroxine 30mcg for [facility]  LOV: 12/31/2018 Next ov: Not scheduled  Last written: 12/31/2017 #90 x3 refills   Approved and sent to pharmacy

## 2019-01-19 ENCOUNTER — Other Ambulatory Visit: Payer: Self-pay

## 2019-01-19 ENCOUNTER — Ambulatory Visit
Admission: RE | Admit: 2019-01-19 | Discharge: 2019-01-19 | Disposition: A | Discharge status: Home / Self Care | Payer: Medicare Other | Source: Ambulatory Visit | Attending: Family Medicine | Admitting: Family Medicine

## 2019-01-19 DIAGNOSIS — Z1231 Encounter for screening mammogram for malignant neoplasm of breast: Secondary | ICD-10-CM

## 2019-01-26 ENCOUNTER — Ambulatory Visit: Payer: Medicare Other | Admitting: Internal Medicine

## 2019-02-05 ENCOUNTER — Ambulatory Visit: Payer: Medicare Other | Admitting: *Deleted

## 2019-02-05 ENCOUNTER — Other Ambulatory Visit: Payer: Self-pay

## 2019-02-05 DIAGNOSIS — Z7901 Long term (current) use of anticoagulants: Secondary | ICD-10-CM

## 2019-02-05 DIAGNOSIS — Z5181 Encounter for therapeutic drug level monitoring: Secondary | ICD-10-CM

## 2019-02-05 LAB — POCT INR: INR: 2 (ref 2.0–3.0)

## 2019-02-05 NOTE — Patient Instructions (Signed)
Description   Continue on same dosage 1 tablet everyday. Recheck INR in 8 weeks. # [phone] Coumadin clinic. Main # [phone].      

## 2019-02-19 ENCOUNTER — Ambulatory Visit (INDEPENDENT_AMBULATORY_CARE_PROVIDER_SITE_OTHER): Payer: Medicare Other | Admitting: Internal Medicine

## 2019-02-19 ENCOUNTER — Other Ambulatory Visit: Payer: Self-pay

## 2019-02-19 ENCOUNTER — Encounter: Payer: Self-pay | Admitting: Internal Medicine

## 2019-02-19 VITALS — BP 124/78 | HR 66 | Temp 97.5°F | Ht 64.0 in | Wt 207.4 lb

## 2019-02-19 DIAGNOSIS — N2581 Secondary hyperparathyroidism of renal origin: Secondary | ICD-10-CM

## 2019-02-19 DIAGNOSIS — M85859 Other specified disorders of bone density and structure, unspecified thigh: Secondary | ICD-10-CM | POA: Diagnosis not present

## 2019-02-19 MED ORDER — ALENDRONATE SODIUM 70 MG PO TABS: 70.0000 mg | ORAL_TABLET | ORAL | 11 refills | Status: DC

## 2019-02-19 NOTE — Patient Instructions (Addendum)
-   Take Calcium Carbonate 1 tablet with Lunch and 1 tablet with supper - Increase Vitamin D3 to 2000 iu daily  - Start Alendronate 70 mg , once a week with a glass of water

## 2019-02-19 NOTE — Progress Notes (Signed)
Name: Jennifer House  MRN/ DOB: [id number], 02-04-1946    Age/ Sex: 73 y.o., female    PCP: Ma Hillock, DO   Reason for Endocrinology Evaluation: Hyperparathyroidism     Date of Initial Endocrinology Evaluation: 02/19/2019     HPI: Jennifer House is a 73 y.o. female with a past medical history of osteopenia. The patient presented for initial endocrinology clinic visit on 02/19/2019 for consultative assistance with her hyperparathyroidism and osteopenia   Pt was noted to have elevated PTH level on routine blood work in 11/2018  with normocalcemia .  Pt was diagnosed with osteopenia: years ago   Menarche at age :  25 yrs of age  Menopausal at age : total hysterectomy at age 82  Fracture Hx: Left hip fracture- S/P out of bed fall  Hx of HRT: Yes- for years  FH of osteoporosis or hip fracture: No Prior Hx of anti-estrogenic therapy : No  Prior Hx of anti-resorptive therapy : Does not recall    Calcium carbonate 1 daily  Vitamin D 1000 iu daily    HISTORY:  Past Medical History:  Past Medical History:  Diagnosis Date  . Anxiety   . Atrial fibrillation ([place])    longstanding persistent  . 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 Risa Grill  . Colon polyps   . Depression   . GERD (gastroesophageal reflux disease)   . Headache(784.0)    hx of migraines  . Hematuria    hx  . HLD (hyperlipidemia)    mixed  . HTN (hypertension)   . Hypothyroidism   . Insomnia   . Liver function test abnormality    hx fatty liver  . Murmur, cardiac   . Nonalcoholic fatty liver disease   . Obesity   . Osteoarthritis    low back pain with left sciatica  . Osteopenia    dexa 2013  . Renal calculus    hx  . Restless leg syndrome    Past Surgical History:  Past Surgical History:  Procedure Laterality Date  . APPENDECTOMY    . CARDIAC CATHETERIZATION  1/12   revealed normal cors and LV function  . COLONOSCOPY    . cystocopy  10/17/03   left  stent placement, TURBT  . FOOT SURGERY    . LUMBAR LAMINECTOMY/DECOMPRESSION MICRODISCECTOMY N/A 01/19/2013   Procedure: LUMBAR THREE FOUR LUMBAR LAMINECTOMY/DECOMPRESSION MICRODISCECTOMY 1 LEVEL;  Surgeon: Faythe Ghee, MD;  Location: [facility];  Service: Neurosurgery;  Laterality: N/A;  . TOTAL ABDOMINAL HYSTERECTOMY  1985  . TOTAL HIP ARTHROPLASTY Left 08/28/2017   Procedure: TOTAL HIP ARTHROPLASTY ANTERIOR APPROACH;  Surgeon: Rod Can, MD;  Location: [facility];  Service: Orthopedics;  Laterality: Left;      Social History:  reports that she quit smoking about 33 years ago. She has never used smokeless tobacco. She reports that she does not drink alcohol or use drugs.  Family History: family history includes Heart attack in her mother; Heart failure in her mother; Hypertension in her father and mother; Stroke in her brother and father; Thyroid cancer in her mother.   HOME MEDICATIONS: Allergies as of 02/19/2019      Reactions   Ibandronate Sodium Other (See Comments)   Bones hurt   Pravachol [pravastatin Sodium] Other (See Comments)   Myalgias   Nsaids    anticoagulated   Ultram [tramadol Hcl] Nausea Only      Medication List  Accurate as of February 19, 2019  1:16 PM. If you have any questions, ask your nurse or doctor.        amLODipine 10 MG tablet Commonly known as: NORVASC Take 1 tablet (10 mg total) by mouth daily.   atorvastatin 20 MG tablet Commonly known as: LIPITOR Take 1 tablet (20 mg total) by mouth daily.   benazepril 20 MG tablet Commonly known as: LOTENSIN Take 1.5 tablets (30 mg total) by mouth daily. Needs office visit prior to anymore refills.   Caltrate 600 1500 (600 Ca) MG Tabs tablet Generic drug: calcium carbonate Take 3 tablets by mouth daily.   Centrum Silver tablet Take 1 tablet by mouth daily.   cholecalciferol 1000 units tablet Commonly known as: VITAMIN D Take 1,000 Units by mouth daily.   escitalopram 20 MG tablet  Commonly known as: Lexapro Take 1 tablet (20 mg total) by mouth daily.   ezetimibe 10 MG tablet Commonly known as: ZETIA Take 1 tablet (10 mg total) by mouth daily. Needs office visit prior to any additional refills.   furosemide 20 MG tablet Commonly known as: LASIX TAKE 1 TABLET BY MOUTH EVERY MONDAY, WEDNESDAY AND FRIDAY   gabapentin 100 MG capsule Commonly known as: NEURONTIN 1 cap in morning and 2 caps at night   HYDROcodone-acetaminophen 5-325 MG tablet Commonly known as: NORCO/VICODIN Take 1 tablet by mouth 2 (two) times daily as needed for moderate pain.   levothyroxine 75 MCG tablet Commonly known as: SYNTHROID Take 1 tablet (75 mcg total) by mouth daily.   metoprolol tartrate 50 MG tablet Commonly known as: LOPRESSOR Take 0.5 tablets (25 mg total) by mouth 2 (two) times daily.   nitroGLYCERIN 0.4 MG SL tablet Commonly known as: NITROSTAT PLACE 1 TABLET UNDER THE TONGUE EVERY 5 MINUTES AS NEEDED FOR CHEST PAIN,(MAX 3 TABLETS)   traZODone 100 MG tablet Commonly known as: DESYREL Take 1 tablet (100 mg total) by mouth at bedtime.   vitamin B-12 1000 MCG tablet Commonly known as: CYANOCOBALAMIN Take 1,000 mcg by mouth daily.   warfarin 5 MG tablet Commonly known as: COUMADIN Take as directed by the anticoagulation clinic. If you are unsure how to take this medication, talk to your nurse or doctor. Original instructions: TAKE AS DIRECTED BY COUMADIN CLINIC         REVIEW OF SYSTEMS: A comprehensive ROS was conducted with the patient and is negative except as per HPI and below:  Review of Systems  Constitutional: Negative for chills and fever.  HENT: Negative for congestion and sore throat.   Respiratory: Negative for cough and shortness of breath.   Cardiovascular: Negative for chest pain and palpitations.  Gastrointestinal: Positive for diarrhea and heartburn. Negative for nausea and vomiting.  Musculoskeletal: Positive for back pain, joint pain and  myalgias.       OBJECTIVE:  VS: BP 124/78 (BP Location: Left Arm, Patient Position: Sitting, Cuff Size: Normal)   Pulse 66   Temp (!) 97.5 F (36.4 C)   Ht 5\' 4"  (1.626 m)   Wt 207 lb 6.4 oz (94.1 kg)   SpO2 95%   BMI 35.60 kg/m    Wt Readings from Last 3 Encounters:  02/19/19 207 lb 6.4 oz (94.1 kg)  12/31/18 205 lb 2 oz (93 kg)  12/23/18 204 lb 4 oz (92.6 kg)     EXAM: General: Pt appears well and is in NAD  Hydration: Well-hydrated with moist mucous membranes and good skin turgor  Eyes: External eye  exam normal without stare, lid lag or exophthalmos.  EOM intact.  PERRL.  Ears, Nose, Throat: Hearing: Grossly intact bilaterally Dental: Good dentition  Throat: Clear without mass, erythema or exudate  Neck: General: Supple without adenopathy. Thyroid: Thyroid size normal.  No goiter or nodules appreciated. No thyroid bruit.  Lungs: Clear with good BS bilat with no rales, rhonchi, or wheezes  Heart: Auscultation: RRR.  Abdomen: Normoactive bowel sounds, soft, nontender, without masses or organomegaly palpable  Extremities: Gait and station: Normal gait  Digits and nails: No clubbing, cyanosis, petechiae, or nodes Head and neck: Normal alignment and mobility BL UE: Normal ROM and strength. BL LE: No pretibial edema normal ROM and strength.  Skin: Hair: Texture and amount normal with gender appropriate distribution Skin Inspection: No rashes, acanthosis nigricans/skin tags. No lipohypertrophy Skin Palpation: Skin temperature, texture, and thickness normal to palpation  Neuro: Cranial nerves: II - XII grossly intact  Cerebellar: Normal coordination and movement; no tremor Motor: Normal strength throughout DTRs: 2+ and symmetric in UE without delay in relaxation phase  Mental Status: Judgment, insight: Intact Orientation: Oriented to time, place, and person Memory: Intact for recent and remote events Mood and affect: No depression, anxiety, or agitation     DATA  REVIEWED:   Results for Jennifer, House [facility] (MRN [id number]) as of 02/19/2019 12:35  Ref. Range 12/17/2018 09:35  Sodium Latest Ref Range: 135 - 145 mEq/L 138  Potassium Latest Ref Range: 3.5 - 5.1 mEq/L 4.1  Chloride Latest Ref Range: 96 - 112 mEq/L 103  CO2 Latest Ref Range: 19 - 32 mEq/L 26  Glucose Latest Ref Range: 70 - 99 mg/dL 94  BUN Latest Ref Range: 6 - 23 mg/dL 15  Creatinine Latest Ref Range: 0.40 - 1.20 mg/dL 0.76  Calcium Latest Ref Range: 8.4 - 10.5 mg/dL 9.2  GFR Latest Ref Range: >60.00 mL/min 74.13  Results for Jennifer, House [facility] (MRN [id number]) as of 02/19/2019 12:35  Ref. Range 11/30/2018 14:30  VITD Latest Ref Range: 30.00 - 100.00 ng/mL 30.19   Results for Jennifer, House [facility] (MRN [id number]) as of 02/19/2019 12:35  Ref. Range 01/12/2018 10:45 11/30/2018 14:30  PTH, Intact Latest Ref Range: 14 - 64 pg/mL 71 (H) 138 (H)   DXA 12/08/2018  AP Spine L1-L4 (L3) 12/08/2018 75.2 Normal 1.2 1.310 g/cm2  DualFemur Neck 12/08/2018 75.2 years Osteopenia -2.3 0.713 g/cm2  Right Forearm Radius 33% 12/08/2018 75.2 Normal 0.5 0.922 g/cm2   FRAX MOP : 21%  And Hip 6.0%  ASSESSMENT/PLAN/RECOMMENDATIONS:   1. Secondary Hyperparathyroidism:   - This is most likely secondary to decrease calcium and Vitamin D absorption . She had been on calcium carbonate 1 tablet daily for the past 3 months. As well as Vitamin D.   - She has no hx of chronic PPI use ,  Or gastric sx.  - She was advised to take calcium with lunch and supper , to avoid interference  With LT-4 absorption.    Medications :  - Will increase Calcium carbonate 1 tablet BID  - Vitamin D3 2000 iu daily   2. Osteopenia with a Clinical diagnosis of osteoporosis  - Pt with clinical diagnosis of osteoporosis given left hip fragility fracture. She also meets criteria for treatment due to high FRAX rate.  - This is age related, insufficieny calcium intake /absorption would exacerbate this condition.  - She does  have occasional heartburn, per records she has been intolerant to ibandronate.  - Pt agreed to try alendronate .  We discussed rare side effect of atypical fractures and osteonecrosis. Pt is edentulous .  - She was encouraged to contact us with any GI side effects with alendronate    Medications : Alendronate 70 mg weekly    F/U in 4 months   Signed electronically by: Mack Guise, MD  [facility]  [facility] [address] [place], [place] [address] Phone: [phone] FAX: [phone]   CC: Ma Hillock, DO [address] [place] [address] Phone: [phone] Fax: [phone]   Return to Endocrinology clinic as below: Future Appointments  Date Time Provider [place]  04/02/2019 10:00 AM [facility]

## 2019-03-08 ENCOUNTER — Other Ambulatory Visit: Payer: Medicare Other

## 2019-03-09 ENCOUNTER — Other Ambulatory Visit (INDEPENDENT_AMBULATORY_CARE_PROVIDER_SITE_OTHER): Payer: Medicare Other

## 2019-03-09 ENCOUNTER — Other Ambulatory Visit: Payer: Self-pay

## 2019-03-09 DIAGNOSIS — N2581 Secondary hyperparathyroidism of renal origin: Secondary | ICD-10-CM

## 2019-03-09 LAB — BASIC METABOLIC PANEL
BUN: 15 mg/dL (ref 6–23)
CO2: 26 mEq/L (ref 19–32)
Calcium: 9.6 mg/dL (ref 8.4–10.5)
Chloride: 101 mEq/L (ref 96–112)
Creatinine, Ser: 0.93 mg/dL (ref 0.40–1.20)
GFR: 58.69 mL/min — ABNORMAL LOW (ref >60.00)
Glucose, Bld: 111 mg/dL — ABNORMAL HIGH (ref 70–99)
Potassium: 4.2 mEq/L (ref 3.5–5.1)
Sodium: 136 mEq/L (ref 135–145)

## 2019-03-09 LAB — VITAMIN D 25 HYDROXY (VIT D DEFICIENCY, FRACTURES): VITD: 43.01 ng/mL (ref 30.00–100.00)

## 2019-03-10 LAB — PARATHYROID HORMONE, INTACT (NO CA): PTH: 83 pg/mL — ABNORMAL HIGH (ref 14–64)

## 2019-04-02 ENCOUNTER — Other Ambulatory Visit: Payer: Self-pay

## 2019-04-02 ENCOUNTER — Ambulatory Visit: Payer: Medicare Other | Admitting: *Deleted

## 2019-04-02 DIAGNOSIS — Z7901 Long term (current) use of anticoagulants: Secondary | ICD-10-CM | POA: Diagnosis not present

## 2019-04-02 DIAGNOSIS — I4891 Unspecified atrial fibrillation: Secondary | ICD-10-CM

## 2019-04-02 LAB — POCT INR: INR: 1.9 — AB (ref 2.0–3.0)

## 2019-04-02 NOTE — Patient Instructions (Signed)
Description   Today take 1.5 tablets then continue on same dosage 1 tablet everyday. Recheck INR in 6 weeks. # [phone] Coumadin clinic. Main # [phone].

## 2019-04-22 ENCOUNTER — Telehealth: Payer: Self-pay | Admitting: Family Medicine

## 2019-04-22 ENCOUNTER — Other Ambulatory Visit: Payer: Self-pay

## 2019-04-22 ENCOUNTER — Encounter: Payer: Self-pay | Admitting: Family Medicine

## 2019-04-22 ENCOUNTER — Ambulatory Visit (HOSPITAL_BASED_OUTPATIENT_CLINIC_OR_DEPARTMENT_OTHER)
Admission: RE | Admit: 2019-04-22 | Discharge: 2019-04-22 | Disposition: A | Discharge status: Home / Self Care | Payer: Medicare Other | Source: Ambulatory Visit | Attending: Family Medicine | Admitting: Family Medicine

## 2019-04-22 ENCOUNTER — Ambulatory Visit (INDEPENDENT_AMBULATORY_CARE_PROVIDER_SITE_OTHER): Payer: Medicare Other | Admitting: Family Medicine

## 2019-04-22 VITALS — BP 130/80 | HR 78 | Temp 96.3°F | Resp 16 | Ht 64.0 in | Wt 210.0 lb

## 2019-04-22 DIAGNOSIS — R0781 Pleurodynia: Secondary | ICD-10-CM | POA: Diagnosis not present

## 2019-04-22 NOTE — Telephone Encounter (Signed)
Pt was called, states she is sore but is doing okay. She wanted to make appt to get medication refilled. This was scheduled and she wanted to do a virtual visit. Pts son has smart phone and will be there to help with visit.

## 2019-04-22 NOTE — Progress Notes (Signed)
[place] at [facility] [place], [place], [place] [address] [phone]  Date:  04/22/2019   Name:  Jennifer House   DOB:  03/30/1945   MRN:  [id number]  PCP:  Ma Hillock, DO    Chief Complaint: Chest Pain (rib pain to back pain, since january)   History of Present Illness:  Jennifer House is a 74 y.o. very pleasant female patient who presents with the following:  Pt of Dr Raoul Pitch whom I have not seen in the past, history of hypertension, A. fib, hypothyroidism/hyperparathyroidism She is an endocrinology patient for her thyroid and parathyroid issues Coumadin is managed by cardiology anticoagulation clinic  Here today with her daughter who contributes to the history.  Her daughter states that her mom complained about her side hurting last night, she got worried and made this appointment Lab Results  Component Value Date   INR 1.9 (A) 04/02/2019   INR 2.0 02/05/2019   INR 2.6 12/11/2018    Fosamax Amlodipine Lipitor Benazepril Lexapro Zetia Lasix 3 times a week Gabapentin Levothyroxine Lopressor Trazodone Coumadin  She has noted right sided rib pain for 3-4 weeks ago; she loaded a Christmas tree into a van the day prior to onset of symptoms.  She did not have any acute pain during this activity, but suspects she may have strained herself as pain started the next day She notes a sharp, intermittent pain at the right lower rib border, will come and go No CP or heaviness, no SOB No GI symptoms, no skin findings, no fever or chills, no cough  No rash on the skin   She is using hydrocodone for back pain, and also does take tylenol prn  Phone numbers for result message [phone]   Patient Active Problem List   Diagnosis Date Noted  . Osteopenia of hip 02/19/2019  . Secondary hyperparathyroidism ([place]) 02/19/2019  . Obesity (BMI 30-39.9) 12/31/2018  . Encounter for chronic pain  management 12/23/2018  . Hyperparathyroidism ([place]) 12/17/2018  . Spondylolisthesis at L3-L4 level 12/17/2018  . 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  . Arthritis, lumbar spine 12/17/2018  . Aortic atherosclerosis ([place]) 09/09/2017  . Atherosclerosis of native coronary artery of native heart without angina pectoris 09/09/2017  . Closed displaced fracture of left femoral neck ([place]) 08/28/2017  . Medicare annual wellness visit, subsequent 08/25/2015  . Hyperlipidemia LDL goal <100 08/25/2015  . Vitamin D deficiency 08/25/2015  . History of bladder cancer 08/25/2015  . Long term (current) use of anticoagulants 06/25/2010  . Atrial fibrillation ([place]) 06/28/2009  . Hypothyroidism 01/31/2009  . Depression 01/31/2009  . Essential hypertension, benign 01/31/2009  . Arthritis 01/31/2009  . Osteopenia 01/31/2009  . Insomnia 01/31/2009  . RENAL CALCULUS, HX OF 01/31/2009    Past Medical History:  Diagnosis Date  . Anxiety   . Atrial fibrillation ([place])    longstanding persistent  . 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 Risa Grill  . Colon polyps   . Depression   . GERD (gastroesophageal reflux disease)   . Headache(784.0)    hx of migraines  . Hematuria    hx  . HLD (hyperlipidemia)    mixed  . HTN (hypertension)   . Hypothyroidism   . Insomnia   . Liver function test abnormality    hx fatty liver  . Murmur, cardiac   . Nonalcoholic fatty liver disease   .  Obesity   . Osteoarthritis    low back pain with left sciatica  . Osteopenia    dexa 2013  . Renal calculus    hx  . Restless leg syndrome     Past Surgical History:  Procedure Laterality Date  . APPENDECTOMY    . CARDIAC CATHETERIZATION  1/12   revealed normal cors and LV function  . COLONOSCOPY    . cystocopy  10/17/03   left stent placement, TURBT  . FOOT SURGERY    . LUMBAR LAMINECTOMY/DECOMPRESSION MICRODISCECTOMY N/A 01/19/2013   Procedure: LUMBAR THREE FOUR LUMBAR  LAMINECTOMY/DECOMPRESSION MICRODISCECTOMY 1 LEVEL;  Surgeon: Faythe Ghee, MD;  Location: [facility];  Service: Neurosurgery;  Laterality: N/A;  . TOTAL ABDOMINAL HYSTERECTOMY  1985  . TOTAL HIP ARTHROPLASTY Left 08/28/2017   Procedure: TOTAL HIP ARTHROPLASTY ANTERIOR APPROACH;  Surgeon: Rod Can, MD;  Location: [facility];  Service: Orthopedics;  Laterality: Left;    Social History   Tobacco Use  . Smoking status: Former Smoker    Quit date: 03/18/1985    Years since quitting: 34.1  . Smokeless tobacco: Never Used  . Tobacco comment: denies   Substance Use Topics  . Alcohol use: No  . Drug use: No    Family History  Problem Relation Age of Onset  . Heart failure Mother   . Heart attack Mother   . Hypertension Mother   . Thyroid cancer Mother   . Stroke Father   . Hypertension Father   . Stroke Brother   . Breast cancer Neg Hx   . Colon cancer Neg Hx     Allergies  Allergen Reactions  . Ibandronate Sodium Other (See Comments)    Bones hurt  . Pravachol [Pravastatin Sodium] Other (See Comments)    Myalgias   . Nsaids     anticoagulated  . Ultram [Tramadol Hcl] Nausea Only    Medication list has been reviewed and updated.  Current Outpatient Medications on File Prior to Visit  Medication Sig Dispense Refill  . alendronate (FOSAMAX) 70 MG tablet Take 1 tablet (70 mg total) by mouth every 7 (seven) days. Take with a full glass of water on an empty stomach. 4 tablet 11  . amLODipine (NORVASC) 10 MG tablet Take 1 tablet (10 mg total) by mouth daily. 90 tablet 0  . atorvastatin (LIPITOR) 20 MG tablet Take 1 tablet (20 mg total) by mouth daily. 90 tablet 3  . benazepril (LOTENSIN) 20 MG tablet Take 1.5 tablets (30 mg total) by mouth daily. Needs office visit prior to anymore refills. 135 tablet 1  . Calcium Carbonate (CALTRATE 600) 1500 MG TABS Take 3 tablets by mouth daily.      . cholecalciferol (VITAMIN D) 1000 UNITS tablet Take 1,000 Units by mouth daily.      Marland Kitchen  escitalopram (LEXAPRO) 20 MG tablet Take 1 tablet (20 mg total) by mouth daily. 90 tablet 0  . ezetimibe (ZETIA) 10 MG tablet Take 1 tablet (10 mg total) by mouth daily. Needs office visit prior to any additional refills. 90 tablet 3  . furosemide (LASIX) 20 MG tablet TAKE 1 TABLET BY MOUTH EVERY MONDAY, WEDNESDAY AND FRIDAY 45 tablet 3  . gabapentin (NEURONTIN) 100 MG capsule 1 cap in morning and 2 caps at night 270 capsule 1  . HYDROcodone-acetaminophen (NORCO/VICODIN) 5-325 MG tablet Take 1 tablet by mouth 2 (two) times daily as needed for moderate pain. 60 tablet 0  . levothyroxine (SYNTHROID) 75 MCG tablet Take  1 tablet (75 mcg total) by mouth daily. 90 tablet 3  . metoprolol tartrate (LOPRESSOR) 50 MG tablet Take 0.5 tablets (25 mg total) by mouth 2 (two) times daily. 180 tablet 1  . Multiple Vitamins-Minerals (CENTRUM SILVER) tablet Take 1 tablet by mouth daily.      . nitroGLYCERIN (NITROSTAT) 0.4 MG SL tablet PLACE 1 TABLET UNDER THE TONGUE EVERY 5 MINUTES AS NEEDED FOR CHEST PAIN,(MAX 3 TABLETS) 75 tablet 0  . traZODone (DESYREL) 100 MG tablet Take 1 tablet (100 mg total) by mouth at bedtime. 90 tablet 1  . vitamin B-12 (CYANOCOBALAMIN) 1000 MCG tablet Take 1,000 mcg by mouth daily.    Marland Kitchen warfarin (COUMADIN) 5 MG tablet TAKE AS DIRECTED BY COUMADIN CLINIC 40 tablet 3   No current facility-administered medications on file prior to visit.    Review of Systems:  As per HPI- otherwise negative.  Physical Examination: Vitals:   04/22/19 1020  BP: 130/80  Pulse: 78  Resp: 16  Temp: (!) 96.3 F (35.7 C)  SpO2: 98%   Vitals:   04/22/19 1020  Weight: 210 lb (95.3 kg)  Height: 5\' 4"  (1.626 m)   Body mass index is 36.05 kg/m. Ideal Body Weight: Weight in (lb) to have BMI = 25: 145.3  GEN: WDWN, NAD, Non-toxic, A & O x 3, obese, looks well  HEENT: Atraumatic, Normocephalic. Neck supple. No masses, No LAD.  TM wnl bilaterally, PEERL Ears and Nose: No external deformity. CV: RRR,  No M/G/R. No JVD. No thrill. No extra heart sounds. PULM: CTA B, no wheezes, crackles, rhonchi. No retractions. No resp. distress. No accessory muscle use. ABD: S, NT, ND, +BS. No rebound. No HSM.  Belly is benign She has poorly defined tenderness over the right lower rib border, from lateral to posterior.  No bruise, vesicle or other skin finding, no swelling.  Tenderness is mild but pt states this is the pain she has noticed  EXTR: No c/c/e NEURO Normal gait.  PSYCH: Normally interactive. Conversant. Not depressed or anxious appearing.  Calm demeanor.   DG Ribs Unilateral W/Chest Right  Result Date: 04/22/2019 CLINICAL DATA:  Patient states that she is having posterior right lower rib pains, strained this area but no specific injury, no other complaintspain in right ribs, strain but no fall or other injury noted EXAM: RIGHT RIBS AND CHEST - 3+ VIEW FINDINGS: No fracture or other bone lesions are seen involving the ribs. There is no evidence of pneumothorax or pleural effusion. Both lungs are clear. Heart size and mediastinal contours are within normal limits. IMPRESSION: 1.  No acute cardiopulmonary process. 2. No RIGHT rib fracture or pneumothorax. Electronically Signed   By: Suzy Bouchard M.D.   On: 04/22/2019 11:20    Assessment and Plan: Rib pain on right side - Plan: DG Ribs Unilateral W/Chest Right  Here today with rib pain on the right side.  I suspect she tore some muscle fibers in this area, which is why she continues to have pain.  We will get x-rays today to make sure lungs are clear and there is no apparent fracture Assuming films are okay, I suggested that she continue her current oral medications, might also try a topical Salonpas patch They are asked to let me know if she is getting worse, or not getting better in the next 1 to 2 weeks Invited and answered all questions from the patient and her daughter Moderate medical decision making This visit occurred during the SARS-CoV-2  public health emergency.  Safety protocols were in place, including screening questions prior to the visit, additional usage of staff PPE, and extensive cleaning of exam room while observing appropriate contact time as indicated for disinfecting solutions.    Signed Lamar Blinks, MD  I called with x-ray report, communicated with patient and her daughter.  Lungs are clear, no fracture

## 2019-04-22 NOTE — Patient Instructions (Addendum)
It was good to see you today- I am sorry that your ribs are hurting Please have x-rays taken on the ground floor, then you may go home and I will be in touch with your reports I would suggest trying a topical lidocaine patch such as Salonpas for your pain I will also let Dr Raoul Pitch know that you need a hydrocodone refill Take care, please let me know if you are getting worse or have any other concerns

## 2019-04-22 NOTE — Telephone Encounter (Signed)
Please call patient: I see she was seen at Dr. Arlyn Dunning office today for acute rib pain.  I hope she is doing okay.   She had asked for refill on her pain medications with Dr. Edilia Bo.  Patient has a pain contract with [place], therefore no other providers can refill her pain medications other than this provider.   Please schedule her for an appointment as soon as possible so that we can manage her chronic pain.  Please remind her she has to be seen every 3 months for her chronic pain to receive refills.  I would encourage her to make sure these appointments are made well in advanced in the future, so that she can be fit into the schedule and does not have to go without her pain medications.

## 2019-04-23 ENCOUNTER — Encounter: Payer: Self-pay | Admitting: Family Medicine

## 2019-04-23 ENCOUNTER — Ambulatory Visit (INDEPENDENT_AMBULATORY_CARE_PROVIDER_SITE_OTHER): Payer: Medicare Other | Admitting: Family Medicine

## 2019-04-23 VITALS — BP 132/60 | HR 57 | Ht 64.0 in

## 2019-04-23 DIAGNOSIS — M1990 Unspecified osteoarthritis, unspecified site: Secondary | ICD-10-CM | POA: Diagnosis not present

## 2019-04-23 DIAGNOSIS — M47814 Spondylosis without myelopathy or radiculopathy, thoracic region: Secondary | ICD-10-CM

## 2019-04-23 DIAGNOSIS — G8929 Other chronic pain: Secondary | ICD-10-CM | POA: Diagnosis not present

## 2019-04-23 DIAGNOSIS — M47816 Spondylosis without myelopathy or radiculopathy, lumbar region: Secondary | ICD-10-CM

## 2019-04-23 DIAGNOSIS — M4316 Spondylolisthesis, lumbar region: Secondary | ICD-10-CM

## 2019-04-23 MED ORDER — HYDROCODONE-ACETAMINOPHEN 5-325 MG PO TABS: 1.0000 | ORAL_TABLET | Freq: Two times a day (BID) | ORAL | 0 refills | Status: DC | PRN

## 2019-04-23 MED ORDER — GABAPENTIN 100 MG PO CAPS: ORAL_CAPSULE | ORAL | 1 refills | Status: DC

## 2019-04-23 NOTE — Progress Notes (Signed)
VIRTUAL VISIT VIA VIDEO  I connected with Jennifer House on 04/23/19 at  1:00 PM EST by a video enabled telemedicine application and verified that I am speaking with the correct person using two identifiers. Location patient: Home Location provider: [facility], Office Persons participating in the virtual visit: Patient, Dr. Raoul Pitch and R.Baker, LPN  I discussed the limitations of evaluation and management by telemedicine and the availability of in person appointments. The patient expressed understanding and agreed to proceed.     Jennifer House , 07-Apr-1945, 74 y.o., female MRN: [id number] Patient Care Team    Relationship Specialty Notifications Start End  Jennifer Hillock, DO PCP - General Family Medicine  01/23/15   Jennifer Grayer, MD Attending Physician Cardiology  07/25/11   Ronald Lobo, MD Consulting Physician Gastroenterology  08/25/15   Jennifer Snare, MD Consulting Physician Urology  08/25/15   Renelda Loma, OD  Optometry  08/25/15    Comment: "kimberly" at [facility]    Chief Complaint  Patient presents with  . Pain    Pt needs refills on her pain medication.      Subjective:[name] is a 74 y.o. female present for follow-up on her chronic pain.   Encounter for chronic pain/lumbar arthritis: tramadol not effective.  Vicodin continues to help her a great deal.  She takes every night before bed, and sometimes uses during the day.  She is tolerating the gabapentin addition.  Indication for chronic opioid: arthritis in the thoracic and lumbar spine, most significantly in the lumbar spine Medication and dose: Vicodin 5-325 mg BID PRN # pills per month: 60 Last UDS date: Up-to-date. Pain contract signed (Y/N): Y Date narcotic database last reviewed (include red flags): 04/23/19   Dg Thoracic Spine W/swimmers Result Date: 12/08/2018  IMPRESSION: No fracture or spondylolisthesis. Relatively mild osteoarthritic change at several levels.  Electronically Signed   By: Lowella Grip III M.D.   On: 12/08/2018 13:53   Dg Lumbar Spine Complete Result Date: 12/08/2018 IMPRESSION: Osteoarthritic change at several levels, most notably at L4-5. Slight spondylolisthesis at L3-4 is felt to be due to underlying spondylosis. No other spondylolisthesis. No fracture. Aortic Atherosclerosis (ICD10-I70.0).     Depression screen [facility] 2/9 12/31/2018 06/02/2018 01/12/2018 09/09/2017 04/30/2017  Decreased Interest 0 0 0 0 0  Down, Depressed, Hopeless 0 0 0 0 0  PHQ - 2 Score 0 0 0 0 0  Altered sleeping - 0 0 - -  Tired, decreased energy - 0 1 - -  Change in appetite - 0 0 - -  Feeling bad or failure about yourself  - 0 0 - -  Trouble concentrating - 0 1 - -  Moving slowly or fidgety/restless - 0 0 - -  Suicidal thoughts - 0 0 - -  PHQ-9 Score - 0 2 - -  Difficult doing work/chores - Not difficult at all Not difficult at all - -    Allergies  Allergen Reactions  . Ibandronate Sodium Other (See Comments)    Bones hurt  . Pravachol [Pravastatin Sodium] Other (See Comments)    Myalgias   . Nsaids     anticoagulated  . Ultram [Tramadol Hcl] Nausea Only   Social History   Social History Narrative   Lives in [place], with husband Eddie Dibbles.   Has 4 adult children , highest level of education is seventh grade.   Former smoker, denies recreational drugs or alcohol use.   Patient drink caffeine  beverages, takes a daily vitamin   Patient was her seatbelt , she has dentures , she has a smoke detector in the home , there are guns in the home a  Locked case    Patient feels safe in her relationships       Past Medical History:  Diagnosis Date  . Anxiety   . Atrial fibrillation ([place])    longstanding persistent  . 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 Risa Grill  . Colon polyps   . Depression   . GERD (gastroesophageal reflux disease)   . Headache(784.0)    hx of migraines  . Hematuria    hx  . HLD (hyperlipidemia)     mixed  . HTN (hypertension)   . Hypothyroidism   . Insomnia   . Liver function test abnormality    hx fatty liver  . Murmur, cardiac   . Nonalcoholic fatty liver disease   . Obesity   . Osteoarthritis    low back pain with left sciatica  . Osteopenia    dexa 2013  . Renal calculus    hx  . Restless leg syndrome    Past Surgical History:  Procedure Laterality Date  . APPENDECTOMY    . CARDIAC CATHETERIZATION  1/12   revealed normal cors and LV function  . COLONOSCOPY    . cystocopy  10/17/03   left stent placement, TURBT  . FOOT SURGERY    . LUMBAR LAMINECTOMY/DECOMPRESSION MICRODISCECTOMY N/A 01/19/2013   Procedure: LUMBAR THREE FOUR LUMBAR LAMINECTOMY/DECOMPRESSION MICRODISCECTOMY 1 LEVEL;  Surgeon: Faythe Ghee, MD;  Location: [facility];  Service: Neurosurgery;  Laterality: N/A;  . TOTAL ABDOMINAL HYSTERECTOMY  1985  . TOTAL HIP ARTHROPLASTY Left 08/28/2017   Procedure: TOTAL HIP ARTHROPLASTY ANTERIOR APPROACH;  Surgeon: Rod Can, MD;  Location: [facility];  Service: Orthopedics;  Laterality: Left;   Family History  Problem Relation Age of Onset  . Heart failure Mother   . Heart attack Mother   . Hypertension Mother   . Thyroid cancer Mother   . Stroke Father   . Hypertension Father   . Stroke Brother   . Breast cancer Neg Hx   . Colon cancer Neg Hx    Allergies as of 04/23/2019      Reactions   Ibandronate Sodium Other (See Comments)   Bones hurt   Pravachol [pravastatin Sodium] Other (See Comments)   Myalgias   Nsaids    anticoagulated   Ultram [tramadol Hcl] Nausea Only      Medication List       Accurate as of April 23, 2019  1:20 PM. If you have any questions, ask your nurse or doctor.        alendronate 70 MG tablet Commonly known as: FOSAMAX Take 1 tablet (70 mg total) by mouth every 7 (seven) days. Take with a full glass of water on an empty stomach.   amLODipine 10 MG tablet Commonly known as: NORVASC Take 1 tablet (10 mg total) by  mouth daily.   atorvastatin 20 MG tablet Commonly known as: LIPITOR Take 1 tablet (20 mg total) by mouth daily.   benazepril 20 MG tablet Commonly known as: LOTENSIN Take 1.5 tablets (30 mg total) by mouth daily. Needs office visit prior to anymore refills.   Caltrate 600 1500 (600 Ca) MG Tabs tablet Generic drug: calcium carbonate Take 3 tablets by mouth daily.   Centrum Silver tablet Take 1 tablet by mouth daily.  cholecalciferol 1000 units tablet Commonly known as: VITAMIN D Take 1,000 Units by mouth daily.   escitalopram 20 MG tablet Commonly known as: Lexapro Take 1 tablet (20 mg total) by mouth daily.   ezetimibe 10 MG tablet Commonly known as: ZETIA Take 1 tablet (10 mg total) by mouth daily. Needs office visit prior to any additional refills.   furosemide 20 MG tablet Commonly known as: LASIX TAKE 1 TABLET BY MOUTH EVERY MONDAY, WEDNESDAY AND FRIDAY   gabapentin 100 MG capsule Commonly known as: NEURONTIN 1 cap in morning and 2 caps at night   HYDROcodone-acetaminophen 5-325 MG tablet Commonly known as: NORCO/VICODIN Take 1 tablet by mouth 2 (two) times daily as needed for moderate pain. Do not refill until after 05/17/2019 What changed: additional instructions Changed by: Howard Pouch, DO   levothyroxine 75 MCG tablet Commonly known as: SYNTHROID Take 1 tablet (75 mcg total) by mouth daily.   metoprolol tartrate 50 MG tablet Commonly known as: LOPRESSOR Take 0.5 tablets (25 mg total) by mouth 2 (two) times daily.   nitroGLYCERIN 0.4 MG SL tablet Commonly known as: NITROSTAT PLACE 1 TABLET UNDER THE TONGUE EVERY 5 MINUTES AS NEEDED FOR CHEST PAIN,(MAX 3 TABLETS)   traZODone 100 MG tablet Commonly known as: DESYREL Take 1 tablet (100 mg total) by mouth at bedtime.   vitamin B-12 1000 MCG tablet Commonly known as: CYANOCOBALAMIN Take 1,000 mcg by mouth daily.   warfarin 5 MG tablet Commonly known as: COUMADIN Take as directed by the  anticoagulation clinic. If you are unsure how to take this medication, talk to your nurse or doctor. Original instructions: TAKE AS DIRECTED BY COUMADIN CLINIC       All past medical history, surgical history, allergies, family history, immunizations andmedications were updated in the EMR today and reviewed under the history and medication portions of their EMR.     ROS: Negative, with the exception of above mentioned in HPI   Objective:  BP 132/60   Pulse (!) 57   Ht 5\' 4"  (1.626 m)   BMI 36.05 kg/m  Body mass index is 36.05 kg/m. Gen: Afebrile. No acute distress.  HENT: AT. [place].  Eyes:Pupils Equal Round Reactive to light, Extraocular movements intact,  Conjunctiva without redness, discharge or icterus. Neuro:  Alert. Oriented.  Psych: Normal affect, dress and demeanor. Normal speech. Normal thought content and judgment.    Assessment/Plan: Jennifer House is a 74 y.o. female present for OV for  Arthritis, lumbar spine/Osteoarthritis of thoracic spine, unspecified spinal osteoarthritis complication status/Spondylolisthesis at L3-L4 level/Encounter for chronic pain -Stable.  increase quality of life with low-dose hydrocodone twice daily as needed - Chronic pain script: Continue hydrocodone 5-325 mg BID PRn  (x3 scripts called in )for chronic back/arthritis pain.    - UDS collected  - contract signed  - [place] controlled substance database reviewed 04/23/19 -  Continue Gabapentin 100 mg 1 tab morning and 2 tabs before bed. - she is tolerating.  - f/u 3 mos.    Reviewed expectations re: course of current medical issues.  Discussed self-management of symptoms.  Outlined signs and symptoms indicating need for more acute intervention.  Patient verbalized understanding and all questions were answered.  Patient received an After-Visit Summary.   No orders of the defined types were placed in this encounter.  Meds ordered this encounter  Medications  .  gabapentin (NEURONTIN) 100 MG capsule    Sig: 1 cap in morning and 2 caps at night  Dispense:  270 capsule    Refill:  1  . DISCONTD: HYDROcodone-acetaminophen (NORCO/VICODIN) 5-325 MG tablet    Sig: Take 1 tablet by mouth 2 (two) times daily as needed for moderate pain.    Dispense:  60 tablet    Refill:  0  . DISCONTD: HYDROcodone-acetaminophen (NORCO/VICODIN) 5-325 MG tablet    Sig: Take 1 tablet by mouth 2 (two) times daily as needed for moderate pain. Do not refill until after 05/17/2019    Dispense:  60 tablet    Refill:  0  . HYDROcodone-acetaminophen (NORCO/VICODIN) 5-325 MG tablet    Sig: Take 1 tablet by mouth 2 (two) times daily as needed for moderate pain. Do not refill until after 05/17/2019    Dispense:  60 tablet    Refill:  0    Do not refill until after 06/15/2019     Note is dictated utilizing voice recognition software. Although note has been proof read prior to signing, occasional typographical errors still can be missed. If any questions arise, please do not hesitate to call for verification.   electronically signed by:  Howard Pouch, DO  [place]

## 2019-04-23 NOTE — Patient Instructions (Addendum)
COVID-19 Vaccine Information can be found at: [email or web address].uk For questions related to vaccine distribution or appointments, please email [email or web address] or call [phone].  Covid Vaccine appointment go to [email or web address].   Follow-up every 3 months for your chronic pain Follow-up every 6 months for your other chronic medical conditions.

## 2019-05-14 ENCOUNTER — Ambulatory Visit: Payer: Medicare Other

## 2019-05-14 ENCOUNTER — Other Ambulatory Visit: Payer: Self-pay

## 2019-05-14 DIAGNOSIS — I4891 Unspecified atrial fibrillation: Secondary | ICD-10-CM

## 2019-05-14 DIAGNOSIS — Z7901 Long term (current) use of anticoagulants: Secondary | ICD-10-CM

## 2019-05-14 LAB — POCT INR: INR: 2.6 (ref 2.0–3.0)

## 2019-05-14 NOTE — Patient Instructions (Signed)
Description   °Continue on same dosage 1 tablet every day. Recheck INR in 6 weeks. # [phone] Coumadin clinic. Main # [phone].  °  ° °

## 2019-05-30 ENCOUNTER — Other Ambulatory Visit: Payer: Self-pay | Admitting: Family Medicine

## 2019-05-30 DIAGNOSIS — F331 Major depressive disorder, recurrent, moderate: Secondary | ICD-10-CM

## 2019-06-01 ENCOUNTER — Other Ambulatory Visit: Payer: Self-pay | Admitting: Family Medicine

## 2019-06-01 DIAGNOSIS — I10 Essential (primary) hypertension: Secondary | ICD-10-CM

## 2019-06-21 ENCOUNTER — Other Ambulatory Visit: Payer: Self-pay | Admitting: Internal Medicine

## 2019-06-22 ENCOUNTER — Other Ambulatory Visit: Payer: Self-pay

## 2019-06-22 NOTE — Progress Notes (Signed)
This visit will be cancelled. Pt checked in at 7:45 AM but her appointment was at 9:10 AM., By the time the patient was called for her appointment at ~ 9 Am, she had left home .     Abby Nena Jordan, MD  [facility] Endocrinology  [facility] [address] [place], [place] [address] Phone: [phone] FAX: [phone]

## 2019-06-23 ENCOUNTER — Encounter: Payer: Medicare Other | Admitting: Internal Medicine

## 2019-06-24 LAB — HEMOGLOBIN A1C: Hemoglobin A1C: 5.8

## 2019-06-25 ENCOUNTER — Ambulatory Visit: Payer: Medicare Other | Admitting: Pharmacist

## 2019-06-25 ENCOUNTER — Other Ambulatory Visit: Payer: Self-pay

## 2019-06-25 DIAGNOSIS — I4891 Unspecified atrial fibrillation: Secondary | ICD-10-CM

## 2019-06-25 DIAGNOSIS — Z7901 Long term (current) use of anticoagulants: Secondary | ICD-10-CM | POA: Diagnosis not present

## 2019-06-25 LAB — POCT INR: INR: 2.9 (ref 2.0–3.0)

## 2019-06-25 NOTE — Patient Instructions (Signed)
Description   °Continue on same dosage 1 tablet every day. Recheck INR in 6 weeks. # [phone] Coumadin clinic. Main # [phone].  °  ° °

## 2019-06-29 ENCOUNTER — Telehealth: Payer: Self-pay

## 2019-06-29 NOTE — Telephone Encounter (Signed)
Called in wanting to let the Dr know her A1C was 5.8 on last Thursday no pre diabetes    Please call and advise

## 2019-06-30 NOTE — Telephone Encounter (Signed)
Abstracted into chart. Sent to Dr Raoul Pitch as an Juluis Rainier

## 2019-06-30 NOTE — Telephone Encounter (Signed)
Great. Noted. Thanks.

## 2019-07-28 ENCOUNTER — Ambulatory Visit (INDEPENDENT_AMBULATORY_CARE_PROVIDER_SITE_OTHER): Payer: Medicare Other | Admitting: Family Medicine

## 2019-07-28 ENCOUNTER — Other Ambulatory Visit: Payer: Self-pay

## 2019-07-28 ENCOUNTER — Encounter: Payer: Self-pay | Admitting: Family Medicine

## 2019-07-28 VITALS — BP 124/76 | HR 59 | Temp 97.2°F | Resp 18 | Ht 64.0 in | Wt 199.0 lb

## 2019-07-28 DIAGNOSIS — Z79899 Other long term (current) drug therapy: Secondary | ICD-10-CM | POA: Diagnosis not present

## 2019-07-28 DIAGNOSIS — Z5181 Encounter for therapeutic drug level monitoring: Secondary | ICD-10-CM

## 2019-07-28 DIAGNOSIS — M47814 Spondylosis without myelopathy or radiculopathy, thoracic region: Secondary | ICD-10-CM | POA: Diagnosis not present

## 2019-07-28 DIAGNOSIS — E039 Hypothyroidism, unspecified: Secondary | ICD-10-CM | POA: Diagnosis not present

## 2019-07-28 DIAGNOSIS — M47816 Spondylosis without myelopathy or radiculopathy, lumbar region: Secondary | ICD-10-CM | POA: Diagnosis not present

## 2019-07-28 DIAGNOSIS — E785 Hyperlipidemia, unspecified: Secondary | ICD-10-CM

## 2019-07-28 DIAGNOSIS — F331 Major depressive disorder, recurrent, moderate: Secondary | ICD-10-CM

## 2019-07-28 DIAGNOSIS — I10 Essential (primary) hypertension: Secondary | ICD-10-CM | POA: Diagnosis not present

## 2019-07-28 DIAGNOSIS — G4700 Insomnia, unspecified: Secondary | ICD-10-CM

## 2019-07-28 DIAGNOSIS — G8929 Other chronic pain: Secondary | ICD-10-CM

## 2019-07-28 LAB — COMPREHENSIVE METABOLIC PANEL
ALT: 26 U/L (ref 0–35)
AST: 25 U/L (ref 0–37)
Albumin: 4.3 g/dL (ref 3.5–5.2)
Alkaline Phosphatase: 70 U/L (ref 39–117)
BUN: 18 mg/dL (ref 6–23)
CO2: 27 mEq/L (ref 19–32)
Calcium: 9.4 mg/dL (ref 8.4–10.5)
Chloride: 104 mEq/L (ref 96–112)
Creatinine, Ser: 1 mg/dL (ref 0.40–1.20)
GFR: 53.92 mL/min — ABNORMAL LOW (ref >60.00)
Glucose, Bld: 104 mg/dL — ABNORMAL HIGH (ref 70–99)
Potassium: 4.4 mEq/L (ref 3.5–5.1)
Sodium: 141 mEq/L (ref 135–145)
Total Bilirubin: 0.7 mg/dL (ref 0.2–1.2)
Total Protein: 6.6 g/dL (ref 6.0–8.3)

## 2019-07-28 LAB — LIPID PANEL
Cholesterol: 128 mg/dL (ref 0–200)
HDL: 44.3 mg/dL (ref >39.00)
LDL Cholesterol: 58 mg/dL (ref 0–99)
NonHDL: 83.44
Total CHOL/HDL Ratio: 3
Triglycerides: 127 mg/dL (ref 0.0–149.0)
VLDL: 25.4 mg/dL (ref 0.0–40.0)

## 2019-07-28 MED ORDER — METOPROLOL TARTRATE 50 MG PO TABS: 25.0000 mg | ORAL_TABLET | Freq: Two times a day (BID) | ORAL | 1 refills | Status: DC

## 2019-07-28 MED ORDER — GABAPENTIN 100 MG PO CAPS: ORAL_CAPSULE | ORAL | 1 refills | Status: DC

## 2019-07-28 MED ORDER — HYDROCODONE-ACETAMINOPHEN 5-325 MG PO TABS: 1.0000 | ORAL_TABLET | Freq: Two times a day (BID) | ORAL | 0 refills | Status: DC | PRN

## 2019-07-28 MED ORDER — TRAZODONE HCL 100 MG PO TABS: 100.0000 mg | ORAL_TABLET | Freq: Every day | ORAL | 1 refills | Status: DC

## 2019-07-28 MED ORDER — BENAZEPRIL HCL 20 MG PO TABS: 30.0000 mg | ORAL_TABLET | Freq: Every day | ORAL | 1 refills | Status: DC

## 2019-07-28 MED ORDER — ESCITALOPRAM OXALATE 20 MG PO TABS: 20.0000 mg | ORAL_TABLET | Freq: Every day | ORAL | 1 refills | Status: DC

## 2019-07-28 MED ORDER — AMLODIPINE BESYLATE 10 MG PO TABS: 10.0000 mg | ORAL_TABLET | Freq: Every day | ORAL | 1 refills | Status: DC

## 2019-07-28 NOTE — Progress Notes (Signed)
Jennifer House , [birth year], 74 y.o., female MRN: [id number] Patient Care Team    Relationship Specialty Notifications Start End  Ma Hillock, DO PCP - General Family Medicine  01/23/15   Thompson Grayer, MD Attending Physician Cardiology  07/25/11   Ronald Lobo, MD Consulting Physician Gastroenterology  08/25/15   Rana Snare, MD Consulting Physician Urology  08/25/15   Renelda Loma, OD  Optometry  08/25/15    Comment: "kimberly" at [facility]    Chief Complaint  Patient presents with  . Arthritis    Pt needs refills on meds      Subjective:Jennifer House is a 74 y.o. female present for follow-up on  Hypertension/hyperlipidemia/hypertriglyceridemia/A. fib: Pt reportscompliance with Benzapril 30 mg, amlodipine 10 mg, metoprolol 25 mg twice a day, Lasix 20 mg (MWF) today.Patient denies chest pain, shortness of breath, dizziness or lower extremity edema.  Pt is on warfarin therapy per cardiology. Pt is prescribed Zetia and tolerating the addition of statin.  Labs UTD 11/2018.  Diet: Low-sodium Exercise: Tries to exercise  RF: Hypertension, hyperlipidemia, family history of heart disease and stroke, personal history A. Fib, obesity, former smoker  Depression/insomnia: doing well on  trazodone and lexapro. She needs refills today- no complaints. States she feels really good.  Encounter for chronic pain/lumbar arthritis: tramadol not effective.  Vicodin helps her pain and keeps her active.  She takes almost every night before bed, and sometimes uses during the day if she is having more pain- but not routinely.  She is tolerating the gabapentin addition.  Indication for chronic opioid: arthritis in the thoracic and lumbar spine, most significantly in the lumbar spine Medication and dose: Vicodin 5-325 mg BID PRN # pills per month: 60 Last UDS date: Up-to-date. Pain contract signed (Y/N): Y Date narcotic database last reviewed (include red flags): 07/28/19   Dg  Thoracic Spine W/swimmers Result Date: 12/08/2018  IMPRESSION: No fracture or spondylolisthesis. Relatively mild osteoarthritic change at several levels. Electronically Signed   By: Lowella Grip III M.D.   On: 12/08/2018 13:53   Dg Lumbar Spine Complete Result Date: 12/08/2018 IMPRESSION: Osteoarthritic change at several levels, most notably at L4-5. Slight spondylolisthesis at L3-4 is felt to be due to underlying spondylosis. No other spondylolisthesis. No fracture. Aortic Atherosclerosis (ICD10-I70.0).     Depression screen [facility] 2/9 12/31/2018 06/02/2018 01/12/2018 09/09/2017 04/30/2017  Decreased Interest 0 0 0 0 0  Down, Depressed, Hopeless 0 0 0 0 0  PHQ - 2 Score 0 0 0 0 0  Altered sleeping - 0 0 - -  Tired, decreased energy - 0 1 - -  Change in appetite - 0 0 - -  Feeling bad or failure about yourself  - 0 0 - -  Trouble concentrating - 0 1 - -  Moving slowly or fidgety/restless - 0 0 - -  Suicidal thoughts - 0 0 - -  PHQ-9 Score - 0 2 - -  Difficult doing work/chores - Not difficult at all Not difficult at all - -    Allergies  Allergen Reactions  . Ibandronate Sodium Other (See Comments)    Bones hurt  . Pravachol [Pravastatin Sodium] Other (See Comments)    Myalgias   . Nsaids     anticoagulated  . Ultram [Tramadol Hcl] Nausea Only   Social History   Social History Narrative   Lives in [place], with husband Jennifer House.   Has 4 adult children , highest level of education is  seventh grade.   Former smoker, denies recreational drugs or alcohol use.   Patient drink caffeine beverages, takes a daily vitamin   Patient was her seatbelt , she has dentures , she has a smoke detector in the home , there are guns in the home a  Locked case    Patient feels safe in her relationships       Past Medical History:  Diagnosis Date  . Anxiety   . Atrial fibrillation ([place])    longstanding persistent  . 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 Risa Grill  . Colon  polyps   . Depression   . GERD (gastroesophageal reflux disease)   . Headache(784.0)    hx of migraines  . Hematuria    hx  . HLD (hyperlipidemia)    mixed  . HTN (hypertension)   . Hypothyroidism   . Insomnia   . Liver function test abnormality    hx fatty liver  . Murmur, cardiac   . Nonalcoholic fatty liver disease   . Obesity   . Osteoarthritis    low back pain with left sciatica  . Osteopenia    dexa 2013  . Renal calculus    hx  . Restless leg syndrome    Past Surgical History:  Procedure Laterality Date  . APPENDECTOMY    . CARDIAC CATHETERIZATION  1/12   revealed normal cors and LV function  . COLONOSCOPY    . cystocopy  10/17/03   left stent placement, TURBT  . FOOT SURGERY    . LUMBAR LAMINECTOMY/DECOMPRESSION MICRODISCECTOMY N/A 01/19/2013   Procedure: LUMBAR THREE FOUR LUMBAR LAMINECTOMY/DECOMPRESSION MICRODISCECTOMY 1 LEVEL;  Surgeon: Faythe Ghee, MD;  Location: [facility];  Service: Neurosurgery;  Laterality: N/A;  . TOTAL ABDOMINAL HYSTERECTOMY  1985  . TOTAL HIP ARTHROPLASTY Left 08/28/2017   Procedure: TOTAL HIP ARTHROPLASTY ANTERIOR APPROACH;  Surgeon: Rod Can, MD;  Location: [facility];  Service: Orthopedics;  Laterality: Left;   Family History  Problem Relation Age of Onset  . Heart failure Mother   . Heart attack Mother   . Hypertension Mother   . Thyroid cancer Mother   . Stroke Father   . Hypertension Father   . Stroke Brother   . Breast cancer Neg Hx   . Colon cancer Neg Hx    Allergies as of 07/28/2019      Reactions   Ibandronate Sodium Other (See Comments)   Bones hurt   Pravachol [pravastatin Sodium] Other (See Comments)   Myalgias   Nsaids    anticoagulated   Ultram [tramadol Hcl] Nausea Only      Medication List       Accurate as of Jul 28, 2019 10:35 AM. If you have any questions, ask your nurse or doctor.        alendronate 70 MG tablet Commonly known as: FOSAMAX Take 1 tablet (70 mg total) by mouth every 7  (seven) days. Take with a full glass of water on an empty stomach.   amLODipine 10 MG tablet Commonly known as: NORVASC Take 1 tablet (10 mg total) by mouth daily. What changed: See the new instructions. Changed by: Howard Pouch, DO   atorvastatin 20 MG tablet Commonly known as: LIPITOR Take 1 tablet (20 mg total) by mouth daily.   benazepril 20 MG tablet Commonly known as: LOTENSIN Take 1.5 tablets (30 mg total) by mouth daily. Needs office visit prior to anymore refills.   Caltrate 600 1500 (600  Ca) MG Tabs tablet Generic drug: calcium carbonate Take 3 tablets by mouth daily.   Centrum Silver tablet Take 1 tablet by mouth daily.   cholecalciferol 1000 units tablet Commonly known as: VITAMIN D Take 1,000 Units by mouth daily.   escitalopram 20 MG tablet Commonly known as: LEXAPRO Take 1 tablet (20 mg total) by mouth daily. What changed: See the new instructions. Changed by: Howard Pouch, DO   ezetimibe 10 MG tablet Commonly known as: ZETIA Take 1 tablet (10 mg total) by mouth daily. Needs office visit prior to any additional refills.   furosemide 20 MG tablet Commonly known as: LASIX TAKE 1 TABLET BY MOUTH EVERY MONDAY, WEDNESDAY AND FRIDAY   gabapentin 100 MG capsule Commonly known as: NEURONTIN 1 cap in morning and 2 caps at night   HYDROcodone-acetaminophen 5-325 MG tablet Commonly known as: NORCO/VICODIN Take 1 tablet by mouth 2 (two) times daily as needed for moderate pain. Do not refill until after 05/17/2019   levothyroxine 75 MCG tablet Commonly known as: SYNTHROID Take 1 tablet (75 mcg total) by mouth daily.   metoprolol tartrate 50 MG tablet Commonly known as: LOPRESSOR Take 0.5 tablets (25 mg total) by mouth 2 (two) times daily.   nitroGLYCERIN 0.4 MG SL tablet Commonly known as: NITROSTAT PLACE 1 TABLET UNDER THE TONGUE EVERY 5 MINUTES AS NEEDED FOR CHEST PAIN,(MAX 3 TABLETS)   traZODone 100 MG tablet Commonly known as: DESYREL Take 1 tablet  (100 mg total) by mouth at bedtime.   vitamin B-12 1000 MCG tablet Commonly known as: CYANOCOBALAMIN Take 1,000 mcg by mouth daily.   warfarin 5 MG tablet Commonly known as: COUMADIN Take as directed by the anticoagulation clinic. If you are unsure how to take this medication, talk to your nurse or doctor. Original instructions: TAKE AS DIRECTED BY COUMADIN CLINIC       All past medical history, surgical history, allergies, family history, immunizations andmedications were updated in the EMR today and reviewed under the history and medication portions of their EMR.     ROS: Negative, with the exception of above mentioned in HPI   Objective:  BP 124/76 (BP Location: Left Arm, Patient Position: Sitting, Cuff Size: Large)   Pulse (!) 59   Temp (!) 97.2 F (36.2 C) (Temporal)   Resp 18   Ht '5\' 4"'$  (1.626 m)   Wt 199 lb (90.3 kg)   SpO2 97%   BMI 34.16 kg/m  Body mass index is 34.16 kg/m. Gen: Afebrile. No acute distress. Nontoxic. Pleasant female.  HENT: AT. [place]. Bilateral TM visualized and normal in appearance. no cough.  Eyes:Pupils Equal Round Reactive to light, Extraocular movements intact,  Conjunctiva without redness, discharge or icterus. CV: irreg irreg, no edema Chest: CTAB, no wheeze or crackles Abd: Soft. NTND. BS present. no Masses palpated.   Neuro:  Normal gait. PERLA. EOMi. Alert. Oriented x3  Psych: Normal affect, dress and demeanor. Normal speech. Normal thought content and judgment.  Assessment/Plan: Brady Plant is a 74 y.o. female present for OV for  Arthritis, lumbar spine/Osteoarthritis of thoracic spine, unspecified spinal osteoarthritis complication status/Spondylolisthesis at L3-L4 level/Encounter for chronic pain -stable. increase quality of life with low-dose hydrocodone twice daily as needed - Chronic pain script: continue  hydrocodone 5-325 mg BID PRn  (x3 scripts called in )for chronic back/arthritis pain.    - UDS UTD 12/2018 - contract  signed  - [place] controlled substance database reviewed 07/28/19 -  Continue Gabapentin 100 mg 1  tab morning and 2 tabs before bed. - she is tolerating.  - f/u 3-6 mos. When needing refills.   Essential hypertension, benign/hyperlipidemia/A. Fib/ obesity/Atrial fibrillation, unspecified type (HCC)/Aortic atherosclerosis (HCC) - stable.  - continue  Benzapril to 30 mg daily -  continue amlodipine 10  -  Lasix MWF -  metoprolol 25 BID  - Zetia at its current doses.   - continue statin- tolerating.  - Continue follow-up with cardiology for atrial fibrillation and coumadin management - low salt diet, increase exercise.  - Continue Coumadin per [facility] - lipid and cmp  -Follow up 6 months . Hypothyroid: Stable. TSH UTD. Continue current dose.   Hypocalcemia/Osteopenia, unspecified location - takes vit d 1000u daily. Last dexa 2013 - under the care of  endocrine.   Insomnia, unspecified type/ Moderate episode of recurrent major depressive disorder (HCC) Stable.  - continue trazodone  - continue lexapro -6 months  Reviewed expectations re: course of current medical issues.  Discussed self-management of symptoms.  Outlined signs and symptoms indicating need for more acute intervention.  Patient verbalized understanding and all questions were answered.  Patient received an After-Visit Summary.   Orders Placed This Encounter  Procedures  . Comp Met (CMET)  . Lipid panel   Meds ordered this encounter  Medications  . amLODipine (NORVASC) 10 MG tablet    Sig: Take 1 tablet (10 mg total) by mouth daily.    Dispense:  90 tablet    Refill:  1  . benazepril (LOTENSIN) 20 MG tablet    Sig: Take 1.5 tablets (30 mg total) by mouth daily. Needs office visit prior to anymore refills.    Dispense:  135 tablet    Refill:  1  . escitalopram (LEXAPRO) 20 MG tablet    Sig: Take 1 tablet (20 mg total) by mouth daily.    Dispense:  90 tablet    Refill:  1  .  gabapentin (NEURONTIN) 100 MG capsule    Sig: 1 cap in morning and 2 caps at night    Dispense:  270 capsule    Refill:  1  . metoprolol tartrate (LOPRESSOR) 50 MG tablet    Sig: Take 0.5 tablets (25 mg total) by mouth 2 (two) times daily.    Dispense:  90 tablet    Refill:  1  . traZODone (DESYREL) 100 MG tablet    Sig: Take 1 tablet (100 mg total) by mouth at bedtime.    Dispense:  90 tablet    Refill:  1     Note is dictated utilizing voice recognition software. Although note has been proof read prior to signing, occasional typographical errors still can be missed. If any questions arise, please do not hesitate to call for verification.   electronically signed by:  Howard Pouch, DO  [place]

## 2019-07-28 NOTE — Patient Instructions (Addendum)
I have refilled all your medications.  F/u 3 mos if needed on pain> IF needing refills only.  If no pain refills needed in 3 mos--> you do not need to be seen until refills are needed or your routine every 6 months chronic condition appt for Hypertension etc.

## 2019-08-06 ENCOUNTER — Other Ambulatory Visit: Payer: Self-pay

## 2019-08-06 ENCOUNTER — Ambulatory Visit: Payer: Medicare Other | Admitting: *Deleted

## 2019-08-06 DIAGNOSIS — Z5181 Encounter for therapeutic drug level monitoring: Secondary | ICD-10-CM

## 2019-08-06 DIAGNOSIS — I4891 Unspecified atrial fibrillation: Secondary | ICD-10-CM

## 2019-08-06 DIAGNOSIS — Z7901 Long term (current) use of anticoagulants: Secondary | ICD-10-CM | POA: Diagnosis not present

## 2019-08-06 LAB — POCT INR: INR: 2.2 (ref 2.0–3.0)

## 2019-08-06 NOTE — Patient Instructions (Signed)
Description   °Continue on same dosage 1 tablet every day. Recheck INR in 6 weeks. # [phone] Coumadin clinic. Main # [phone].  °  ° °

## 2019-09-17 ENCOUNTER — Other Ambulatory Visit: Payer: Self-pay

## 2019-09-17 ENCOUNTER — Ambulatory Visit: Payer: Medicare Other | Admitting: *Deleted

## 2019-09-17 DIAGNOSIS — Z5181 Encounter for therapeutic drug level monitoring: Secondary | ICD-10-CM

## 2019-09-17 DIAGNOSIS — Z7901 Long term (current) use of anticoagulants: Secondary | ICD-10-CM | POA: Diagnosis not present

## 2019-09-17 LAB — POCT INR: INR: 2.8 (ref 2.0–3.0)

## 2019-09-17 NOTE — Patient Instructions (Signed)
Description   °Continue on same dosage 1 tablet every day. Recheck INR in 6 weeks. # [phone] Coumadin clinic. Main # [phone].  °  ° °

## 2019-10-29 ENCOUNTER — Ambulatory Visit: Payer: Medicare Other | Admitting: *Deleted

## 2019-10-29 ENCOUNTER — Other Ambulatory Visit: Payer: Self-pay

## 2019-10-29 DIAGNOSIS — Z7901 Long term (current) use of anticoagulants: Secondary | ICD-10-CM

## 2019-10-29 DIAGNOSIS — Z5181 Encounter for therapeutic drug level monitoring: Secondary | ICD-10-CM

## 2019-10-29 LAB — POCT INR: INR: 2.1 (ref 2.0–3.0)

## 2019-10-29 NOTE — Patient Instructions (Signed)
Description   °Continue on same dosage 1 tablet every day. Recheck INR in 6 weeks. # [phone] Coumadin clinic. Main # [phone].  °  ° °

## 2019-11-22 ENCOUNTER — Other Ambulatory Visit: Payer: Self-pay | Admitting: Internal Medicine

## 2019-12-10 ENCOUNTER — Other Ambulatory Visit: Payer: Self-pay

## 2019-12-10 ENCOUNTER — Ambulatory Visit: Payer: Medicare Other

## 2019-12-10 DIAGNOSIS — Z7901 Long term (current) use of anticoagulants: Secondary | ICD-10-CM

## 2019-12-10 DIAGNOSIS — I4891 Unspecified atrial fibrillation: Secondary | ICD-10-CM

## 2019-12-10 LAB — POCT INR: INR: 3 (ref 2.0–3.0)

## 2019-12-10 NOTE — Patient Instructions (Signed)
Description   °Continue on same dosage 1 tablet every day. Recheck INR in 6 weeks. # [phone] Coumadin clinic. Main # [phone].  °  ° °

## 2019-12-26 ENCOUNTER — Other Ambulatory Visit: Payer: Self-pay | Admitting: Family Medicine

## 2019-12-26 DIAGNOSIS — E785 Hyperlipidemia, unspecified: Secondary | ICD-10-CM

## 2019-12-27 NOTE — Telephone Encounter (Signed)
Pt has enough to last until appt on 28th

## 2019-12-29 ENCOUNTER — Other Ambulatory Visit: Payer: Self-pay | Admitting: Family Medicine

## 2019-12-29 ENCOUNTER — Ambulatory Visit (INDEPENDENT_AMBULATORY_CARE_PROVIDER_SITE_OTHER): Payer: Medicare Other

## 2019-12-29 VITALS — Ht 64.0 in | Wt 193.0 lb

## 2019-12-29 DIAGNOSIS — Z0000 Encounter for general adult medical examination without abnormal findings: Secondary | ICD-10-CM

## 2019-12-29 NOTE — Patient Instructions (Signed)
Jennifer House , Thank you for taking time to complete your Medicare Wellness Visit. I appreciate your ongoing commitment to your health goals. Please review the following plan we discussed and let me know if I can assist you in the future.   Screening recommendations/referrals: Colonoscopy: No longer required Mammogram: Completed 01/19/2019- Due 01/19/2020 Bone Density: Completed 12/08/2018-Due 12/07/2020 Recommended yearly ophthalmology/optometry visit for glaucoma screening and checkup Recommended yearly dental visit for hygiene and checkup  Vaccinations: Influenza vaccine: Due- May obtain at our office or your local pharmacy Pneumococcal vaccine: Completed vaccines Tdap vaccine: Up to Date- Due-02/22/2015 Shingles vaccine: Discuss with pharmacy Covid-19:Declined  Advanced directives: Information mailed today.  Conditions/risks identified: See problem list  Next appointment: Follow up in one year for your annual wellness visit 01/03/2021 @ 2:15pm.   Preventive Care 76 Years and Older, Female Preventive care refers to lifestyle choices and visits with your health care provider that can promote health and wellness. What does preventive care include?  A yearly physical exam. This is also called an annual well check.  Dental exams once or twice a year.  Routine eye exams. Ask your health care provider how often you should have your eyes checked.  Personal lifestyle choices, including:  Daily care of your teeth and gums.  Regular physical activity.  Eating a healthy diet.  Avoiding tobacco and drug use.  Limiting alcohol use.  Practicing safe sex.  Taking low-dose aspirin every day.  Taking vitamin and mineral supplements as recommended by your health care provider. What happens during an annual well check? The services and screenings done by your health care provider during your annual well check will depend on your age, overall health, lifestyle risk factors, and family  history of disease. Counseling  Your health care provider may ask you questions about your:  Alcohol use.  Tobacco use.  Drug use.  Emotional well-being.  Home and relationship well-being.  Sexual activity.  Eating habits.  History of falls.  Memory and ability to understand (cognition).  Work and work Statistician.  Reproductive health. Screening  You may have the following tests or measurements:  Height, weight, and BMI.  Blood pressure.  Lipid and cholesterol levels. These may be checked every 5 years, or more frequently if you are over 90 years old.  Skin check.  Lung cancer screening. You may have this screening every year starting at age 76 if you have a 30-pack-year history of smoking and currently smoke or have quit within the past 15 years.  Fecal occult blood test (FOBT) of the stool. You may have this test every year starting at age 76.  Flexible sigmoidoscopy or colonoscopy. You may have a sigmoidoscopy every 5 years or a colonoscopy every 10 years starting at age 76.  Hepatitis C blood test.  Hepatitis B blood test.  Sexually transmitted disease (STD) testing.  Diabetes screening. This is done by checking your blood sugar (glucose) after you have not eaten for a while (fasting). You may have this done every 1-3 years.  Bone density scan. This is done to screen for osteoporosis. You may have this done starting at age 76.  Mammogram. This may be done every 1-2 years. Talk to your health care provider about how often you should have regular mammograms. Talk with your health care provider about your test results, treatment options, and if necessary, the need for more tests. Vaccines  Your health care provider may recommend certain vaccines, such as:  Influenza vaccine. This is recommended every  year.  Tetanus, diphtheria, and acellular pertussis (Tdap, Td) vaccine. You may need a Td booster every 10 years.  Zoster vaccine. You may need this after  age 76.  Pneumococcal 13-valent conjugate (PCV13) vaccine. One dose is recommended after age 76.  Pneumococcal polysaccharide (PPSV23) vaccine. One dose is recommended after age 76. Talk to your health care provider about which screenings and vaccines you need and how often you need them. This information is not intended to replace advice given to you by your health care provider. Make sure you discuss any questions you have with your health care provider. Document Released: 03/31/2015 Document Revised: 11/22/2015 Document Reviewed: 01/03/2015 Elsevier Interactive Patient Education  2017 [employer] Prevention in the Home Falls can cause injuries. They can happen to people of all ages. There are many things you can do to make your home safe and to help prevent falls. What can I do on the outside of my home?  Regularly fix the edges of walkways and driveways and fix any cracks.  Remove anything that might make you trip as you walk through a door, such as a raised step or threshold.  Trim any bushes or trees on the path to your home.  Use bright outdoor lighting.  Clear any walking paths of anything that might make someone trip, such as rocks or tools.  Regularly check to see if handrails are loose or broken. Make sure that both sides of any steps have handrails.  Any raised decks and porches should have guardrails on the edges.  Have any leaves, snow, or ice cleared regularly.  Use sand or salt on walking paths during winter.  Clean up any spills in your garage right away. This includes oil or grease spills. What can I do in the bathroom?  Use night lights.  Install grab bars by the toilet and in the tub and shower. Do not use towel bars as grab bars.  Use non-skid mats or decals in the tub or shower.  If you need to sit down in the shower, use a plastic, non-slip stool.  Keep the floor dry. Clean up any water that spills on the floor as soon as it  happens.  Remove soap buildup in the tub or shower regularly.  Attach bath mats securely with double-sided non-slip rug tape.  Do not have throw rugs and other things on the floor that can make you trip. What can I do in the bedroom?  Use night lights.  Make sure that you have a light by your bed that is easy to reach.  Do not use any sheets or blankets that are too big for your bed. They should not hang down onto the floor.  Have a firm chair that has side arms. You can use this for support while you get dressed.  Do not have throw rugs and other things on the floor that can make you trip. What can I do in the kitchen?  Clean up any spills right away.  Avoid walking on wet floors.  Keep items that you use a lot in easy-to-reach places.  If you need to reach something above you, use a strong step stool that has a grab bar.  Keep electrical cords out of the way.  Do not use floor polish or wax that makes floors slippery. If you must use wax, use non-skid floor wax.  Do not have throw rugs and other things on the floor that can make you trip. What can  I do with my stairs?  Do not leave any items on the stairs.  Make sure that there are handrails on both sides of the stairs and use them. Fix handrails that are broken or loose. Make sure that handrails are as long as the stairways.  Check any carpeting to make sure that it is firmly attached to the stairs. Fix any carpet that is loose or worn.  Avoid having throw rugs at the top or bottom of the stairs. If you do have throw rugs, attach them to the floor with carpet tape.  Make sure that you have a light switch at the top of the stairs and the bottom of the stairs. If you do not have them, ask someone to add them for you. What else can I do to help prevent falls?  Wear shoes that:  Do not have high heels.  Have rubber bottoms.  Are comfortable and fit you well.  Are closed at the toe. Do not wear sandals.  If you  use a stepladder:  Make sure that it is fully opened. Do not climb a closed stepladder.  Make sure that both sides of the stepladder are locked into place.  Ask someone to hold it for you, if possible.  Clearly mark and make sure that you can see:  Any grab bars or handrails.  First and last steps.  Where the edge of each step is.  Use tools that help you move around (mobility aids) if they are needed. These include:  Canes.  Walkers.  Scooters.  Crutches.  Turn on the lights when you go into a dark area. Replace any light bulbs as soon as they burn out.  Set up your furniture so you have a clear path. Avoid moving your furniture around.  If any of your floors are uneven, fix them.  If there are any pets around you, be aware of where they are.  Review your medicines with your doctor. Some medicines can make you feel dizzy. This can increase your chance of falling. Ask your doctor what other things that you can do to help prevent falls. This information is not intended to replace advice given to you by your health care provider. Make sure you discuss any questions you have with your health care provider. Document Released: 12/29/2008 Document Revised: 08/10/2015 Document Reviewed: 04/08/2014 Elsevier Interactive Patient Education  2017 [employer].

## 2019-12-29 NOTE — Progress Notes (Signed)
Subjective:   Jennifer House is a 74 y.o. female who presents for Medicare Annual (Subsequent) preventive examination.  I connected with Jennifer House today by telephone and verified that I am speaking with the correct person using two identifiers. Location patient: home Location provider: work Persons participating in the virtual visit: patient, Marine scientist.    I discussed the limitations, risks, security and privacy concerns of performing an evaluation and management service by telephone and the availability of in person appointments. I also discussed with the patient that there may be a patient responsible charge related to this service. The patient expressed understanding and verbally consented to this telephonic visit.    Interactive audio and video telecommunications were attempted between this provider and patient, however failed, due to patient having technical difficulties OR patient did not have access to video capability.  We continued and completed visit with audio only.  Some vital signs may be absent or patient reported.   Time Spent with patient on telephone encounter: 20 minutes  Review of Systems     Cardiac Risk Factors include: advanced age (>41men, >97 women);dyslipidemia;hypertension;obesity (BMI >30kg/m2)     Objective:    Today's Vitals   12/29/19 1416  Weight: 193 lb (87.5 kg)  Height: 5\' 4"  (1.626 m)   Body mass index is 33.13 kg/m.  Advanced Directives 12/29/2019 08/28/2017 09/19/2016 01/19/2013 01/18/2013  Does Patient Have a Medical Advance Directive? No No No Patient does not have advance directive Patient does not have advance directive;Patient would like information  Would patient like information on creating a medical advance directive? Yes (MAU/Ambulatory/Procedural Areas - Information given) No - Patient declined Yes (MAU/Ambulatory/Procedural Areas - Information given) Advance directive packet given Advance directive packet given  Pre-existing out of  facility DNR order (yellow form or pink MOST form) - - - No -    Current Medications (verified) Outpatient Encounter Medications as of 12/29/2019  Medication Sig  . alendronate (FOSAMAX) 70 MG tablet Take 1 tablet (70 mg total) by mouth every 7 (seven) days. Take with a full glass of water on an empty stomach.  Marland Kitchen amLODipine (NORVASC) 10 MG tablet Take 1 tablet (10 mg total) by mouth daily.  Marland Kitchen atorvastatin (LIPITOR) 20 MG tablet Take 1 tablet (20 mg total) by mouth daily.  . benazepril (LOTENSIN) 20 MG tablet Take 1.5 tablets (30 mg total) by mouth daily. Needs office visit prior to anymore refills.  . Calcium Carbonate (CALTRATE 600) 1500 MG TABS Take 3 tablets by mouth daily.    . cholecalciferol (VITAMIN D) 1000 UNITS tablet Take 1,000 Units by mouth daily.    Marland Kitchen escitalopram (LEXAPRO) 20 MG tablet Take 1 tablet (20 mg total) by mouth daily.  Marland Kitchen ezetimibe (ZETIA) 10 MG tablet Take 1 tablet (10 mg total) by mouth daily. Needs office visit prior to any additional refills.  . furosemide (LASIX) 20 MG tablet TAKE 1 TABLET BY MOUTH EVERY MONDAY, WEDNESDAY AND FRIDAY  . gabapentin (NEURONTIN) 100 MG capsule 1 cap in morning and 2 caps at night  . HYDROcodone-acetaminophen (NORCO/VICODIN) 5-325 MG tablet Take 1 tablet by mouth 2 (two) times daily as needed for moderate pain.  Marland Kitchen levothyroxine (SYNTHROID) 75 MCG tablet TAKE 1 TABLET(75 MCG) BY MOUTH DAILY  . metoprolol tartrate (LOPRESSOR) 50 MG tablet Take 0.5 tablets (25 mg total) by mouth 2 (two) times daily.  . Multiple Vitamins-Minerals (CENTRUM SILVER) tablet Take 1 tablet by mouth daily.    . nitroGLYCERIN (NITROSTAT) 0.4 MG SL tablet PLACE  1 TABLET UNDER THE TONGUE EVERY 5 MINUTES AS NEEDED FOR CHEST PAIN,(MAX 3 TABLETS)  . traZODone (DESYREL) 100 MG tablet Take 1 tablet (100 mg total) by mouth at bedtime.  . vitamin B-12 (CYANOCOBALAMIN) 1000 MCG tablet Take 1,000 mcg by mouth daily.  Marland Kitchen warfarin (COUMADIN) 5 MG tablet TAKE AS DIRECTED BY  COUMADIN CLINIC   No facility-administered encounter medications on file as of 12/29/2019.    Allergies (verified) Ibandronate sodium, Pravachol [pravastatin sodium], Nsaids, and Ultram [tramadol hcl]   History: Past Medical History:  Diagnosis Date  . Anxiety   . Atrial fibrillation ([place])    longstanding persistent  . 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 Risa Grill  . Colon polyps   . Depression   . GERD (gastroesophageal reflux disease)   . Headache(784.0)    hx of migraines  . Hematuria    hx  . HLD (hyperlipidemia)    mixed  . HTN (hypertension)   . Hypothyroidism   . Insomnia   . Liver function test abnormality    hx fatty liver  . Murmur, cardiac   . Nonalcoholic fatty liver disease   . Obesity   . Osteoarthritis    low back pain with left sciatica  . Osteopenia    dexa 2013  . Renal calculus    hx  . Restless leg syndrome    Past Surgical History:  Procedure Laterality Date  . APPENDECTOMY    . CARDIAC CATHETERIZATION  1/12   revealed normal cors and LV function  . COLONOSCOPY    . cystocopy  10/17/03   left stent placement, TURBT  . FOOT SURGERY    . LUMBAR LAMINECTOMY/DECOMPRESSION MICRODISCECTOMY N/A 01/19/2013   Procedure: LUMBAR THREE FOUR LUMBAR LAMINECTOMY/DECOMPRESSION MICRODISCECTOMY 1 LEVEL;  Surgeon: Faythe Ghee, MD;  Location: [facility];  Service: Neurosurgery;  Laterality: N/A;  . TOTAL ABDOMINAL HYSTERECTOMY  1985  . TOTAL HIP ARTHROPLASTY Left 08/28/2017   Procedure: TOTAL HIP ARTHROPLASTY ANTERIOR APPROACH;  Surgeon: Rod Can, MD;  Location: [place];  Service: Orthopedics;  Laterality: Left;   Family History  Problem Relation Age of Onset  . Heart failure Mother   . Heart attack Mother   . Hypertension Mother   . Thyroid cancer Mother   . Stroke Father   . Hypertension Father   . Stroke Brother   . Breast cancer Neg Hx   . Colon cancer Neg Hx    Social History   Socioeconomic History  . Marital  status: Married    Spouse name: Not on file  . Number of children: Not on file  . Years of education: Not on file  . Highest education level: Not on file  Occupational History  . Not on file  Tobacco Use  . Smoking status: Former Smoker    Quit date: 03/18/1985    Years since quitting: 34.8  . Smokeless tobacco: Never Used  . Tobacco comment: denies   Vaping Use  . Vaping Use: Never used  Substance and Sexual Activity  . Alcohol use: No  . Drug use: No  . Sexual activity: Never  Other Topics Concern  . Not on file  Social History Narrative   Lives in [place], with husband Eddie Dibbles.   Has 4 adult children , highest level of education is seventh grade.   Former smoker, denies recreational drugs or alcohol use.   Patient drink caffeine beverages, takes a daily vitamin   Patient was her seatbelt ,  she has dentures , she has a smoke detector in the home , there are guns in the home a  Locked case    Patient feels safe in her relationships       Social Determinants of Health   Financial Resource Strain: Low Risk   . Difficulty of Paying Living Expenses: Not hard at all  Food Insecurity: No Food Insecurity  . Worried About Charity fundraiser in the Last Year: Never true  . Ran Out of Food in the Last Year: Never true  Transportation Needs: No Transportation Needs  . Lack of Transportation (Medical): No  . Lack of Transportation (Non-Medical): No  Physical Activity: Insufficiently Active  . Days of Exercise per Week: 4 days  . Minutes of Exercise per Session: 30 min  Stress: No Stress Concern Present  . Feeling of Stress : Not at all  Social Connections: Moderately Isolated  . Frequency of Communication with Friends and Family: More than three times a week  . Frequency of Social Gatherings with Friends and Family: Once a week  . Attends Religious Services: Never  . Active Member of Clubs or Organizations: No  . Attends Archivist Meetings: Never  . Marital Status:  Married    Tobacco Counseling Counseling given: Not Answered Comment: denies    Clinical Intake:  Pre-visit preparation completed: Yes  Pain : No/denies pain     Nutritional Status: BMI > 30  Obese Nutritional Risks: None Diabetes: No  How often do you need to have someone help you when you read instructions, pamphlets, or other written materials from your doctor or pharmacy?: 1 - Never What is the last grade level you completed in school?: 5th grade  Diabetic?No  Interpreter Needed?: No  Information entered by :: Caroleen Hamman LPN   Activities of Daily Living In your present state of health, do you have any difficulty performing the following activities: 12/29/2019  Hearing? N  Vision? N  Difficulty concentrating or making decisions? N  Walking or climbing stairs? N  Dressing or bathing? N  Doing errands, shopping? N  Preparing Food and eating ? N  Using the Toilet? N  In the past six months, have you accidently leaked urine? N  Do you have problems with loss of bowel control? N  Managing your Medications? N  Managing your Finances? N  Housekeeping or managing your Housekeeping? N  Some recent data might be hidden    Patient Care Team: Ma Hillock, DO as PCP - General (Family Medicine) Thompson Grayer, MD as Attending Physician (Cardiology) Ronald Lobo, MD as Consulting Physician (Gastroenterology) Rana Snare, MD as Consulting Physician (Urology) Jodi Mourning, Amy V, OD (Optometry)  Indicate any recent Medical Services you may have received from other than [facility] providers in the past year (date may be approximate).     Assessment:   This is a routine wellness examination for Marieta.  Hearing/Vision screen No exam data present  Dietary issues and exercise activities discussed: Current Exercise Habits: Home exercise routine, Type of exercise: calisthenics, Time (Minutes): 30, Frequency (Times/Week): 3, Weekly Exercise (Minutes/Week): 90, Exercise  limited by: None identified  Goals      Patient Stated   .  <enter goal here> (pt-stated)      Maintain current health by staying active with gardening/canning.       Depression Screen PHQ 2/9 Scores 12/29/2019 12/31/2018 06/02/2018 01/12/2018 09/09/2017 04/30/2017 03/26/2017  PHQ - 2 Score 0 0 0 0 0 0 0  PHQ- 9 Score - - 0 2 - - 6    Fall Risk Fall Risk  12/29/2019 06/02/2018 01/12/2018 09/09/2017 04/30/2017  Falls in the past year? 0 0 No Yes No  Number falls in past yr: 0 - - - -  Injury with Fall? 0 - - Yes -  Risk Factor Category  - - - High Fall Risk -  Risk for fall due to : - Medication side effect - Impaired balance/gait -  Follow up Falls prevention discussed Education provided - - -    Any stairs in or around the home? No  Home free of loose throw rugs in walkways, pet beds, electrical cords, etc? Yes  Adequate lighting in your home to reduce risk of falls? Yes   ASSISTIVE DEVICES UTILIZED TO PREVENT FALLS:  Life alert? No  Use of a cane, walker or w/c? No  Grab bars in the bathroom? No pt states she is having a grab bar installed soon Shower chair or bench in shower? No  Elevated toilet seat or a handicapped toilet? No   TIMED UP AND GO:  Was the test performed? No . Phone visit   Cognitive Function:No cognitive impairment noted.  MMSE - Mini Mental State Exam 09/19/2016  Orientation to time 5  Orientation to Place 5  Registration 3  Attention/ Calculation 2  Recall 2  Language- name 2 objects 2  Language- repeat 1  Language- follow 3 step command 3  Language- read & follow direction 1  Write a sentence 0  Copy design 1  Total score 25        Immunizations Immunization History  Administered Date(s) Administered  . Fluad Quad(high Dose 65+) 11/30/2018  . Influenza, High Dose Seasonal PF 01/02/2017, 01/12/2018  . Influenza,inj,Quad PF,6+ Mos 01/23/2015, 12/15/2015  . Pneumococcal Conjugate-13 08/18/2014  . Pneumococcal Polysaccharide-23 02/27/2009,  08/25/2015  . Tdap 02/22/2015    TDAP status: Up to date   Flu vaccine status: Due- Patient plans to get vaccine at her next office visit on 01/13/20  Pneumococcal vaccine status: Up to date   Covid-19 vaccine status: Declined, Education has been provided regarding the importance of this vaccine but patient still declined. Advised may receive this vaccine at local pharmacy or Health Dept.or vaccine clinic. Aware to provide a copy of the vaccination record if obtained from local pharmacy or Health Dept. Verbalized acceptance and understanding.  Qualifies for Shingles Vaccine? Yes   Zostavax completed No   Shingrix Completed?: No.    Education has been provided regarding the importance of this vaccine. Patient has been advised to call insurance company to determine out of pocket expense if they have not yet received this vaccine. Advised may also receive vaccine at local pharmacy or Health Dept. Verbalized acceptance and understanding.  Screening Tests Health Maintenance  Topic Date Due  . INFLUENZA VACCINE  10/17/2019  . MAMMOGRAM  01/18/2021  . TETANUS/TDAP  02/21/2025  . DEXA SCAN  Completed  . Hepatitis C Screening  Completed  . PNA vac Low Risk Adult  Completed  . COVID-19 Vaccine  Discontinued    Health Maintenance  Health Maintenance Due  Topic Date Due  . INFLUENZA VACCINE  10/17/2019    Colorectal cancer screening: No longer required.    Mammogram status: Completed 01/19/2019. Repeat every year   Bone Density status: Completed 12/08/2018. Results reflect: Bone density results: OSTEOPENIA. Repeat every 2 years.  Lung Cancer Screening: (Low Dose CT Chest recommended if Age 11-80 years, 30 pack-year  currently smoking OR have quit w/in 15years.) does not qualify.     Additional Screening:  Hepatitis C Screening: Completed 08/25/2015  Vision Screening: Recommended annual ophthalmology exams for early detection of glaucoma and other disorders of the eye. Is the patient  up to date with their annual eye exam?  No  Who is the provider or what is the name of the office in which the patient attends annual eye exams? [facility] Patient advised to make an appt  Dental Screening: Recommended annual dental exams for proper oral hygiene  Community Resource Referral / Chronic Care Management: CRR required this visit?  No   CCM required this visit?  No      Plan:     I have personally reviewed and noted the following in the patient's chart:   . Medical and social history . Use of alcohol, tobacco or illicit drugs  . Current medications and supplements . Functional ability and status . Nutritional status . Physical activity . Advanced directives . List of other physicians . Hospitalizations, surgeries, and ER visits in previous 12 months . Vitals . Screenings to include cognitive, depression, and falls . Referrals and appointments  In addition, I have reviewed and discussed with patient certain preventive protocols, quality metrics, and best practice recommendations. A written personalized care plan for preventive services as well as general preventive health recommendations were provided to patient.    Due to this being a telephonic visit, the after visit summary with patients personalized plan was offered to patient via mail or my-chart.  Patient would like to access on my-chart.    Marta Antu, LPN   [id number]  Nurse Health Advisor  Nurse Notes: None

## 2020-01-03 ENCOUNTER — Other Ambulatory Visit: Payer: Self-pay | Admitting: Family Medicine

## 2020-01-03 DIAGNOSIS — E785 Hyperlipidemia, unspecified: Secondary | ICD-10-CM

## 2020-01-13 ENCOUNTER — Encounter: Payer: Self-pay | Admitting: Family Medicine

## 2020-01-13 ENCOUNTER — Other Ambulatory Visit: Payer: Self-pay

## 2020-01-13 ENCOUNTER — Ambulatory Visit (INDEPENDENT_AMBULATORY_CARE_PROVIDER_SITE_OTHER): Payer: Medicare Other | Admitting: Family Medicine

## 2020-01-13 ENCOUNTER — Other Ambulatory Visit: Payer: Self-pay | Admitting: Family Medicine

## 2020-01-13 VITALS — BP 119/71 | HR 53 | Temp 97.7°F | Ht 64.0 in | Wt 189.0 lb

## 2020-01-13 DIAGNOSIS — E039 Hypothyroidism, unspecified: Secondary | ICD-10-CM | POA: Diagnosis not present

## 2020-01-13 DIAGNOSIS — G4700 Insomnia, unspecified: Secondary | ICD-10-CM

## 2020-01-13 DIAGNOSIS — E213 Hyperparathyroidism, unspecified: Secondary | ICD-10-CM

## 2020-01-13 DIAGNOSIS — F3341 Major depressive disorder, recurrent, in partial remission: Secondary | ICD-10-CM

## 2020-01-13 DIAGNOSIS — Z131 Encounter for screening for diabetes mellitus: Secondary | ICD-10-CM | POA: Diagnosis not present

## 2020-01-13 DIAGNOSIS — Z23 Encounter for immunization: Secondary | ICD-10-CM | POA: Diagnosis not present

## 2020-01-13 DIAGNOSIS — I4891 Unspecified atrial fibrillation: Secondary | ICD-10-CM

## 2020-01-13 DIAGNOSIS — I700 Atherosclerosis of aorta: Secondary | ICD-10-CM | POA: Diagnosis not present

## 2020-01-13 DIAGNOSIS — F331 Major depressive disorder, recurrent, moderate: Secondary | ICD-10-CM

## 2020-01-13 DIAGNOSIS — I2510 Atherosclerotic heart disease of native coronary artery without angina pectoris: Secondary | ICD-10-CM | POA: Diagnosis not present

## 2020-01-13 DIAGNOSIS — I10 Essential (primary) hypertension: Secondary | ICD-10-CM | POA: Diagnosis not present

## 2020-01-13 DIAGNOSIS — E785 Hyperlipidemia, unspecified: Secondary | ICD-10-CM

## 2020-01-13 DIAGNOSIS — Z7901 Long term (current) use of anticoagulants: Secondary | ICD-10-CM

## 2020-01-13 LAB — CBC
HCT: 41.1 % (ref 36.0–46.0)
Hemoglobin: 13.5 g/dL (ref 12.0–15.0)
MCHC: 32.9 g/dL (ref 30.0–36.0)
MCV: 82.3 fl (ref 78.0–100.0)
Platelets: 184 10*3/uL (ref 150.0–400.0)
RBC: 4.99 Mil/uL (ref 3.87–5.11)
RDW: 14.3 % (ref 11.5–15.5)
WBC: 5.8 10*3/uL (ref 4.0–10.5)

## 2020-01-13 LAB — TSH: TSH: 1.41 u[IU]/mL (ref 0.35–4.50)

## 2020-01-13 LAB — POCT GLYCOSYLATED HEMOGLOBIN (HGB A1C)
HbA1c POC (<> result, manual entry): 5.5 % (ref 4.0–5.6)
HbA1c, POC (controlled diabetic range): 5.5 % (ref 0.0–7.0)
HbA1c, POC (prediabetic range): 5.5 % — AB (ref 5.7–6.4)
Hemoglobin A1C: 5.5 % (ref 4.0–5.6)

## 2020-01-13 LAB — VITAMIN D 25 HYDROXY (VIT D DEFICIENCY, FRACTURES): VITD: 26.75 ng/mL — ABNORMAL LOW (ref 30.00–100.00)

## 2020-01-13 MED ORDER — LEVOTHYROXINE SODIUM 75 MCG PO TABS
ORAL_TABLET | ORAL | 3 refills | Status: DC
Start: 2020-01-13 — End: 2020-09-15

## 2020-01-13 MED ORDER — ATORVASTATIN CALCIUM 20 MG PO TABS: 20.0000 mg | ORAL_TABLET | Freq: Every day | ORAL | 3 refills | Status: DC

## 2020-01-13 MED ORDER — TRAZODONE HCL 100 MG PO TABS: 100.0000 mg | ORAL_TABLET | Freq: Every day | ORAL | 1 refills | Status: DC

## 2020-01-13 MED ORDER — AMLODIPINE BESYLATE 10 MG PO TABS: 10.0000 mg | ORAL_TABLET | Freq: Every day | ORAL | 1 refills | Status: DC

## 2020-01-13 MED ORDER — BENAZEPRIL HCL 20 MG PO TABS: 30.0000 mg | ORAL_TABLET | Freq: Every day | ORAL | 1 refills | Status: DC

## 2020-01-13 MED ORDER — PAROXETINE HCL 10 MG PO TABS: 10.0000 mg | ORAL_TABLET | Freq: Every day | ORAL | 1 refills | Status: DC

## 2020-01-13 MED ORDER — HYDROCODONE-ACETAMINOPHEN 5-325 MG PO TABS: 1.0000 | ORAL_TABLET | Freq: Two times a day (BID) | ORAL | 0 refills | Status: DC | PRN

## 2020-01-13 MED ORDER — METOPROLOL TARTRATE 50 MG PO TABS: 25.0000 mg | ORAL_TABLET | Freq: Two times a day (BID) | ORAL | 1 refills | Status: DC

## 2020-01-13 MED ORDER — NITROGLYCERIN 0.4 MG SL SUBL: SUBLINGUAL_TABLET | SUBLINGUAL | 0 refills | Status: AC

## 2020-01-13 MED ORDER — FUROSEMIDE 20 MG PO TABS: ORAL_TABLET | ORAL | 3 refills | Status: DC

## 2020-01-13 MED ORDER — GABAPENTIN 100 MG PO CAPS: ORAL_CAPSULE | ORAL | 1 refills | Status: DC

## 2020-01-13 MED ORDER — ESCITALOPRAM OXALATE 20 MG PO TABS: 20.0000 mg | ORAL_TABLET | Freq: Every day | ORAL | 1 refills | Status: DC

## 2020-01-13 NOTE — Progress Notes (Signed)
Jennifer House , 1946-01-27, 74 y.o., female MRN: [id number] Patient Care Team    Relationship Specialty Notifications Start End  Ma Hillock, DO PCP - General Family Medicine  01/23/15   Thompson Grayer, MD Attending Physician Cardiology  07/25/11   Ronald Lobo, MD Consulting Physician Gastroenterology  08/25/15   Rana Snare, MD Consulting Physician Urology  08/25/15   Renelda Loma, OD  Optometry  08/25/15    Comment: "kimberly" at [facility]    Chief Complaint  Patient presents with  . Follow-up    [facility]; pt is fasting      Subjective:Jennifer House is a 74 y.o. female present for follow-up on  Hypertension/hyperlipidemia/hypertriglyceridemia/A. fib: Pt reportscompliance with Benzapril 30 mg, amlodipine 10 mg, metoprolol 25 mg twice a day, Lasix 20 mg (MWF) today.Patient denies chest pain, shortness of breath, dizziness or lower extremity edema.  Pt is on warfarin therapy per cardiology. Pt is prescribed Zetia and tolerating the addition of statin.  Diet: Low-sodium Exercise: Tries to exercise  RF: Hypertension, hyperlipidemia, family history of heart disease and stroke, personal history A. Fib, obesity, former smoker  Depression/insomnia: increased in anxiety currently and requesting additional coverage. Sleeping well with  trazodone and lexapro.   Encounter for chronic pain/lumbar arthritis: tramadol not effective.  Vicodin helping with  her pain and keeps her active.  She takes medication only when she really needs it- uses tylenol most days.She is compliant with the gabapentin.  Indication for chronic opioid: arthritis in the thoracic and lumbar spine, most significantly in the lumbar spine Medication and dose: Vicodin 5-325 mg BID PRN # pills per month: 60 Last UDS date: Up-to-date. Pain contract signed (Y/N): Y Date narcotic database last reviewed (include red flags): 01/13/20    Depression screen [facility] 2/9 01/13/2020 12/29/2019 12/31/2018 06/02/2018  01/12/2018  Decreased Interest 1 0 0 0 0  Down, Depressed, Hopeless 0 0 0 0 0  PHQ - 2 Score 1 0 0 0 0  Altered sleeping 1 - - 0 0  Tired, decreased energy 1 - - 0 1  Change in appetite 0 - - 0 0  Feeling bad or failure about yourself  1 - - 0 0  Trouble concentrating 0 - - 0 1  Moving slowly or fidgety/restless 1 - - 0 0  Suicidal thoughts 0 - - 0 0  PHQ-9 Score 5 - - 0 2  Difficult doing work/chores - - - Not difficult at all Not difficult at all    Allergies  Allergen Reactions  . Ibandronate Sodium Other (See Comments)    Bones hurt  . Pravachol [Pravastatin Sodium] Other (See Comments)    Myalgias   . Nsaids     anticoagulated  . Ultram [Tramadol Hcl] Nausea Only   Social History   Social History Narrative   Lives in [place], with husband Eddie Dibbles.   Has 4 adult children , highest level of education is seventh grade.   Former smoker, denies recreational drugs or alcohol use.   Patient drink caffeine beverages, takes a daily vitamin   Patient was her seatbelt , she has dentures , she has a smoke detector in the home , there are guns in the home a  Locked case    Patient feels safe in her relationships       Past Medical History:  Diagnosis Date  . Anxiety   . Atrial fibrillation ([place])    longstanding persistent  . 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 Risa Grill  . Colon polyps   . Depression   . GERD (gastroesophageal reflux disease)   . Headache(784.0)    hx of migraines  . Hematuria    hx  . HLD (hyperlipidemia)    mixed  . HTN (hypertension)   . Hypothyroidism   . Insomnia   . Liver function test abnormality    hx fatty liver  . Murmur, cardiac   . Nonalcoholic fatty liver disease   . Obesity   . Osteoarthritis    low back pain with left sciatica  . Osteopenia    dexa 2013  . Renal calculus    hx  . Restless leg syndrome    Past Surgical History:  Procedure Laterality Date  . APPENDECTOMY    . CARDIAC CATHETERIZATION   1/12   revealed normal cors and LV function  . COLONOSCOPY    . cystocopy  10/17/03   left stent placement, TURBT  . FOOT SURGERY    . LUMBAR LAMINECTOMY/DECOMPRESSION MICRODISCECTOMY N/A 01/19/2013   Procedure: LUMBAR THREE FOUR LUMBAR LAMINECTOMY/DECOMPRESSION MICRODISCECTOMY 1 LEVEL;  Surgeon: Faythe Ghee, MD;  Location: [facility];  Service: Neurosurgery;  Laterality: N/A;  . TOTAL ABDOMINAL HYSTERECTOMY  1985  . TOTAL HIP ARTHROPLASTY Left 08/28/2017   Procedure: TOTAL HIP ARTHROPLASTY ANTERIOR APPROACH;  Surgeon: Rod Can, MD;  Location: [facility];  Service: Orthopedics;  Laterality: Left;   Family History  Problem Relation Age of Onset  . Heart failure Mother   . Heart attack Mother   . Hypertension Mother   . Thyroid cancer Mother   . Stroke Father   . Hypertension Father   . Stroke Brother   . Breast cancer Neg Hx   . Colon cancer Neg Hx    Allergies as of 01/13/2020      Reactions   Ibandronate Sodium Other (See Comments)   Bones hurt   Pravachol [pravastatin Sodium] Other (See Comments)   Myalgias   Nsaids    anticoagulated   Ultram [tramadol Hcl] Nausea Only      Medication List       Accurate as of January 13, 2020 11:30 AM. If you have any questions, ask your nurse or doctor.        alendronate 70 MG tablet Commonly known as: FOSAMAX Take 1 tablet (70 mg total) by mouth every 7 (seven) days. Take with a full glass of water on an empty stomach.   amLODipine 10 MG tablet Commonly known as: NORVASC Take 1 tablet (10 mg total) by mouth daily.   atorvastatin 20 MG tablet Commonly known as: LIPITOR Take 1 tablet (20 mg total) by mouth daily.   benazepril 20 MG tablet Commonly known as: LOTENSIN Take 1.5 tablets (30 mg total) by mouth daily. Needs office visit prior to anymore refills.   Caltrate 600 1500 (600 Ca) MG Tabs tablet Generic drug: calcium carbonate Take 3 tablets by mouth daily.   Centrum Silver tablet Take 1 tablet by mouth  daily.   cholecalciferol 1000 units tablet Commonly known as: VITAMIN D Take 1,000 Units by mouth daily.   escitalopram 20 MG tablet Commonly known as: LEXAPRO Take 1 tablet (20 mg total) by mouth daily.   ezetimibe 10 MG tablet Commonly known as: ZETIA TAKE 1 TABLET(10 MG) BY MOUTH DAILY   furosemide 20 MG tablet Commonly known as: LASIX TAKE 1 TABLET BY MOUTH EVERY MONDAY, WEDNESDAY AND FRIDAY   gabapentin 100 MG capsule  Commonly known as: NEURONTIN 1 cap in morning and 2 caps at night   HYDROcodone-acetaminophen 5-325 MG tablet Commonly known as: NORCO/VICODIN Take 1 tablet by mouth 2 (two) times daily as needed for moderate pain.   levothyroxine 75 MCG tablet Commonly known as: SYNTHROID TAKE 1 TABLET(75 MCG) BY MOUTH DAILY   metoprolol tartrate 50 MG tablet Commonly known as: LOPRESSOR Take 0.5 tablets (25 mg total) by mouth 2 (two) times daily.   nitroGLYCERIN 0.4 MG SL tablet Commonly known as: NITROSTAT PLACE 1 TABLET UNDER THE TONGUE EVERY 5 MINUTES AS NEEDED FOR CHEST PAIN,(MAX 3 TABLETS)   PARoxetine 10 MG tablet Commonly known as: PAXIL Take 1 tablet (10 mg total) by mouth daily. Started by: Howard Pouch, DO   traZODone 100 MG tablet Commonly known as: DESYREL Take 1 tablet (100 mg total) by mouth at bedtime.   vitamin B-12 1000 MCG tablet Commonly known as: CYANOCOBALAMIN Take 1,000 mcg by mouth daily.   warfarin 5 MG tablet Commonly known as: COUMADIN Take as directed by the anticoagulation clinic. If you are unsure how to take this medication, talk to your nurse or doctor. Original instructions: TAKE AS DIRECTED BY COUMADIN CLINIC       All past medical history, surgical history, allergies, family history, immunizations andmedications were updated in the EMR today and reviewed under the history and medication portions of their EMR.     ROS: Negative, with the exception of above mentioned in HPI   Objective:  BP 119/71   Pulse (!) 53    Temp 97.7 F (36.5 C) (Oral)   Ht 5\' 4"  (1.626 m)   Wt 189 lb (85.7 kg)   SpO2 97%   BMI 32.44 kg/m  Body mass index is 32.44 kg/m. Gen: Afebrile. No acute distress. pleasant female.  HENT: AT. [place].  Eyes:Pupils Equal Round Reactive to light, Extraocular movements intact,  Conjunctiva without redness, discharge or icterus. Neck/lymp/endocrine: Supple,no lymphadenopathy, no thyromegaly UK:GURKY irreg , no edema, +2/4 P posterior tibialis pulses Chest: CTAB, no wheeze or crackles Abd: Soft. NTND. BS present.   Skin: no rashes, purpura or petechiae.  Neuro:  Normal gait. PERLA. EOMi. Alert. Oriented x3  Psych: Normal affect, dress and demeanor. Normal speech. Normal thought content and judgment.   Assessment/Plan: Jennifer House is a 74 y.o. female present for OV for  Arthritis, lumbar spine/Osteoarthritis of thoracic spine, unspecified spinal osteoarthritis complication status/Spondylolisthesis at L3-L4 level/Encounter for chronic pain Stable.. increase quality of life with low-dose hydrocodone twice daily as needed- rarely uses.  - Chronic pain script: continue  hydrocodone 5-325 mg BID PRn  #180 supplied.  - UDS UTD 12/2018 - contract signed  - [place] controlled substance database reviewed 01/13/20 -  Continue Gabapentin 100 mg 1 tab morning and 2 tabs before bed. - she is tolerating.  - f/u 5.5 mos. Diabetes screen:  A1c> great 5.5 Essential hypertension, benign/hyperlipidemia/A. Fib/ obesity/Atrial fibrillation, unspecified type (HCC)/Aortic atherosclerosis (HCC) Stable.  - continue  Benzapril to 30 mg daily -  Continue amlodipine 10  -  Continue Lasix MWF -  Continue metoprolol 25 BID  - continue Zetia at its current doses.   - continue statin- tolerating.  - Continue follow-up with cardiology for atrial fibrillation and coumadin management - low salt diet, increase exercise.  - cbc collected today  -Follow up 6 months . Hypothyroid: Labs due today. Will  refill levothyroxine at appropriate dose after results  Hypocalcemia/Osteopenia, unspecified location - takes vit d 1000u  daily. Last dexa 2013 - under the care of  endocrine. vit d levels collected today  Insomnia, unspecified type/ Moderate episode of recurrent major depressive disorder ([place]) Needs additional coverage for increased anxiety - continue  trazodone  - continue  lexapro - added paxil 10 mg qd -5.5 months  Influenza vaccine administered today   Reviewed expectations re: course of current medical issues.  Discussed self-management of symptoms.  Outlined signs and symptoms indicating need for more acute intervention.  Patient verbalized understanding and all questions were answered.  Patient received an After-Visit Summary.   Orders Placed This Encounter  Procedures  . Flu Vaccine QUAD High Dose(Fluad)  . TSH  . CBC  . Vitamin D (25 hydroxy)  . POCT HgB A1C   Meds ordered this encounter  Medications  . atorvastatin (LIPITOR) 20 MG tablet    Sig: Take 1 tablet (20 mg total) by mouth daily.    Dispense:  90 tablet    Refill:  3  . traZODone (DESYREL) 100 MG tablet    Sig: Take 1 tablet (100 mg total) by mouth at bedtime.    Dispense:  90 tablet    Refill:  1  . nitroGLYCERIN (NITROSTAT) 0.4 MG SL tablet    Sig: PLACE 1 TABLET UNDER THE TONGUE EVERY 5 MINUTES AS NEEDED FOR CHEST PAIN,(MAX 3 TABLETS)    Dispense:  25 tablet    Refill:  0  . metoprolol tartrate (LOPRESSOR) 50 MG tablet    Sig: Take 0.5 tablets (25 mg total) by mouth 2 (two) times daily.    Dispense:  90 tablet    Refill:  1  . gabapentin (NEURONTIN) 100 MG capsule    Sig: 1 cap in morning and 2 caps at night    Dispense:  270 capsule    Refill:  1  . furosemide (LASIX) 20 MG tablet    Sig: TAKE 1 TABLET BY MOUTH EVERY MONDAY, WEDNESDAY AND FRIDAY    Dispense:  45 tablet    Refill:  3  . escitalopram (LEXAPRO) 20 MG tablet    Sig: Take 1 tablet (20 mg total) by mouth daily.     Dispense:  90 tablet    Refill:  1  . benazepril (LOTENSIN) 20 MG tablet    Sig: Take 1.5 tablets (30 mg total) by mouth daily. Needs office visit prior to anymore refills.    Dispense:  135 tablet    Refill:  1  . amLODipine (NORVASC) 10 MG tablet    Sig: Take 1 tablet (10 mg total) by mouth daily.    Dispense:  90 tablet    Refill:  1  . PARoxetine (PAXIL) 10 MG tablet    Sig: Take 1 tablet (10 mg total) by mouth daily.    Dispense:  90 tablet    Refill:  1  . HYDROcodone-acetaminophen (NORCO/VICODIN) 5-325 MG tablet    Sig: Take 1 tablet by mouth 2 (two) times daily as needed for moderate pain.    Dispense:  180 tablet    Refill:  0    Do not refill until after 10/15/2019     Note is dictated utilizing voice recognition software. Although note has been proof read prior to signing, occasional typographical errors still can be missed. If any questions arise, please do not hesitate to call for verification.   electronically signed by:  Howard Pouch, DO  [place]

## 2020-01-13 NOTE — Patient Instructions (Signed)
I have refilled your medications.  We added paxil today to help with anxiety.  Next appt 5.5 months.

## 2020-01-21 ENCOUNTER — Other Ambulatory Visit: Payer: Self-pay

## 2020-01-21 ENCOUNTER — Ambulatory Visit: Payer: Medicare Other | Admitting: *Deleted

## 2020-01-21 DIAGNOSIS — Z5181 Encounter for therapeutic drug level monitoring: Secondary | ICD-10-CM | POA: Diagnosis not present

## 2020-01-21 DIAGNOSIS — Z7901 Long term (current) use of anticoagulants: Secondary | ICD-10-CM

## 2020-01-21 LAB — POCT INR: INR: 2.1 (ref 2.0–3.0)

## 2020-01-21 NOTE — Patient Instructions (Signed)
Description   Continue on same dosage 1 tablet everyday. Recheck INR in 6 weeks. # [phone] Coumadin clinic. Main # [phone].

## 2020-02-28 ENCOUNTER — Other Ambulatory Visit: Payer: Self-pay | Admitting: Family Medicine

## 2020-02-28 DIAGNOSIS — I10 Essential (primary) hypertension: Secondary | ICD-10-CM

## 2020-03-01 ENCOUNTER — Telehealth: Payer: Self-pay

## 2020-03-01 NOTE — Telephone Encounter (Signed)
Spoke with pt daughter, Anaaya, asked if pt has been needing rx. Pt daughter said she only takes it as needed and has picked up since 04/27/2019. Informed daughter that insurance isn't covering medication and a PA is required, due to being the end of the year I asked daughter would she call me before needing a refill so I can call and do PA.     Number for PA is [phone]. Pt ID is [id number].

## 2020-03-01 NOTE — Telephone Encounter (Signed)
Spoke with pharmacy in regards to PA needed for Hydrocodone. Pharmacy stated the pt has never picked medication and have been needing PA for the entire year.

## 2020-03-03 ENCOUNTER — Ambulatory Visit: Payer: Medicare Other | Admitting: Pharmacist

## 2020-03-03 ENCOUNTER — Other Ambulatory Visit: Payer: Self-pay

## 2020-03-03 DIAGNOSIS — Z7901 Long term (current) use of anticoagulants: Secondary | ICD-10-CM | POA: Diagnosis not present

## 2020-03-03 LAB — POCT INR: INR: 2.1 (ref 2.0–3.0)

## 2020-03-03 NOTE — Patient Instructions (Signed)
Description   Continue on same dosage 1 tablet every day. Recheck INR in 6 weeks. # [phone] Coumadin clinic. Main # [phone].

## 2020-03-05 ENCOUNTER — Other Ambulatory Visit: Payer: Self-pay | Admitting: Internal Medicine

## 2020-03-12 ENCOUNTER — Other Ambulatory Visit: Payer: Self-pay | Admitting: Family Medicine

## 2020-03-22 ENCOUNTER — Other Ambulatory Visit: Payer: Self-pay | Admitting: Family Medicine

## 2020-03-22 DIAGNOSIS — F331 Major depressive disorder, recurrent, moderate: Secondary | ICD-10-CM

## 2020-03-28 ENCOUNTER — Other Ambulatory Visit: Payer: Self-pay | Admitting: Internal Medicine

## 2020-04-14 ENCOUNTER — Other Ambulatory Visit: Payer: Self-pay

## 2020-04-14 ENCOUNTER — Ambulatory Visit: Payer: Medicare Other

## 2020-04-14 DIAGNOSIS — Z7901 Long term (current) use of anticoagulants: Secondary | ICD-10-CM | POA: Diagnosis not present

## 2020-04-14 DIAGNOSIS — Z5181 Encounter for therapeutic drug level monitoring: Secondary | ICD-10-CM

## 2020-04-14 DIAGNOSIS — I4811 Longstanding persistent atrial fibrillation: Secondary | ICD-10-CM

## 2020-04-14 LAB — POCT INR: INR: 1.5 — AB (ref 2.0–3.0)

## 2020-04-14 NOTE — Patient Instructions (Signed)
-   take 2 tablets warfarin tonight, then  - Continue on same dosage 1 tablet warfarin every day.  - Recheck INR in 4 weeks.  # [phone] Coumadin clinic. Main # [phone].

## 2020-05-10 ENCOUNTER — Ambulatory Visit (HOSPITAL_BASED_OUTPATIENT_CLINIC_OR_DEPARTMENT_OTHER)
Admission: RE | Admit: 2020-05-10 | Discharge: 2020-05-10 | Disposition: A | Discharge status: Home / Self Care | Payer: Medicare Other | Source: Ambulatory Visit | Attending: Family Medicine | Admitting: Family Medicine

## 2020-05-10 ENCOUNTER — Ambulatory Visit (INDEPENDENT_AMBULATORY_CARE_PROVIDER_SITE_OTHER): Payer: Medicare Other | Admitting: Family Medicine

## 2020-05-10 ENCOUNTER — Encounter: Payer: Self-pay | Admitting: Family Medicine

## 2020-05-10 ENCOUNTER — Other Ambulatory Visit: Payer: Self-pay

## 2020-05-10 VITALS — BP 121/75 | HR 59 | Temp 98.2°F | Ht 64.0 in | Wt 193.0 lb

## 2020-05-10 DIAGNOSIS — M25871 Other specified joint disorders, right ankle and foot: Secondary | ICD-10-CM

## 2020-05-10 DIAGNOSIS — M7731 Calcaneal spur, right foot: Secondary | ICD-10-CM | POA: Diagnosis not present

## 2020-05-10 DIAGNOSIS — M778 Other enthesopathies, not elsewhere classified: Secondary | ICD-10-CM | POA: Diagnosis not present

## 2020-05-10 DIAGNOSIS — M79671 Pain in right foot: Secondary | ICD-10-CM | POA: Diagnosis not present

## 2020-05-10 DIAGNOSIS — M19071 Primary osteoarthritis, right ankle and foot: Secondary | ICD-10-CM | POA: Diagnosis not present

## 2020-05-10 DIAGNOSIS — R2241 Localized swelling, mass and lump, right lower limb: Secondary | ICD-10-CM | POA: Diagnosis not present

## 2020-05-10 DIAGNOSIS — M7751 Other enthesopathy of right foot: Secondary | ICD-10-CM | POA: Diagnosis not present

## 2020-05-10 NOTE — Patient Instructions (Signed)
Great to see you today Have xrays completed at [facility] and we will call you with results and plan

## 2020-05-10 NOTE — Progress Notes (Signed)
This visit occurred during the SARS-CoV-2 public health emergency.  Safety protocols were in place, including screening questions prior to the visit, additional usage of staff PPE, and extensive cleaning of exam room while observing appropriate contact time as indicated for disinfecting solutions.    Jennifer House , July 10, 1945, 75 y.o., female MRN: [id number] Patient Care Team    Relationship Specialty Notifications Start End  Ma Hillock, DO PCP - General Family Medicine  01/23/15   Thompson Grayer, MD Attending Physician Cardiology  07/25/11   Ronald Lobo, MD Consulting Physician Gastroenterology  08/25/15   Rana Snare, MD Consulting Physician Urology  08/25/15   Renelda Loma, OD  Optometry  08/25/15    Comment: "kimberly" at [facility]    Chief Complaint  Patient presents with  . Joint Swelling    Pt c/o right ankle swelling and pain x 6 mos; worsen within week;      Subjective: Pt presents for an OV with complaints right foot/ankle pain of 6 months duration that has worsened over the last week.  Patient states she does not recall an injury last week or 6 months ago when symptoms first started.  She reports it can be painful to weight-bear at times, otherwise it is not painful.  It is intermittently swelling sometimes more than others at the lateral aspect of her ankle.  She denies any fever or redness.  Depression screen [facility] 2/9 05/10/2020 01/13/2020 12/29/2019 12/31/2018 06/02/2018  Decreased Interest 0 1 0 0 0  Down, Depressed, Hopeless 0 0 0 0 0  PHQ - 2 Score 0 1 0 0 0  Altered sleeping - 1 - - 0  Tired, decreased energy - 1 - - 0  Change in appetite - 0 - - 0  Feeling bad or failure about yourself  - 1 - - 0  Trouble concentrating - 0 - - 0  Moving slowly or fidgety/restless - 1 - - 0  Suicidal thoughts - 0 - - 0  PHQ-9 Score - 5 - - 0  Difficult doing work/chores - - - - Not difficult at all    Allergies  Allergen Reactions  . Ibandronate Sodium Other  (See Comments)    Bones hurt  . Pravachol [Pravastatin Sodium] Other (See Comments)    Myalgias   . Nsaids     anticoagulated  . Ultram [Tramadol Hcl] Nausea Only   Social History   Social History Narrative   Lives in [place], with husband Eddie Dibbles.   Has 4 adult children , highest level of education is seventh grade.   Former smoker, denies recreational drugs or alcohol use.   Patient drink caffeine beverages, takes a daily vitamin   Patient was her seatbelt , she has dentures , she has a smoke detector in the home , there are guns in the home a  Locked case    Patient feels safe in her relationships       Past Medical History:  Diagnosis Date  . Anxiety   . Atrial fibrillation ([place])    longstanding persistent  . 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 Risa Grill  . Colon polyps   . Depression   . GERD (gastroesophageal reflux disease)   . Headache(784.0)    hx of migraines  . Hematuria    hx  . HLD (hyperlipidemia)    mixed  . HTN (hypertension)   . Hypothyroidism   . Insomnia   . Liver  function test abnormality    hx fatty liver  . Murmur, cardiac   . Nonalcoholic fatty liver disease   . Obesity   . Osteoarthritis    low back pain with left sciatica  . Osteopenia    dexa 2013  . Renal calculus    hx  . Restless leg syndrome    Past Surgical History:  Procedure Laterality Date  . APPENDECTOMY    . CARDIAC CATHETERIZATION  1/12   revealed normal cors and LV function  . COLONOSCOPY    . cystocopy  10/17/03   left stent placement, TURBT  . FOOT SURGERY    . LUMBAR LAMINECTOMY/DECOMPRESSION MICRODISCECTOMY N/A 01/19/2013   Procedure: LUMBAR THREE FOUR LUMBAR LAMINECTOMY/DECOMPRESSION MICRODISCECTOMY 1 LEVEL;  Surgeon: Faythe Ghee, MD;  Location: [facility];  Service: Neurosurgery;  Laterality: N/A;  . TOTAL ABDOMINAL HYSTERECTOMY  1985  . TOTAL HIP ARTHROPLASTY Left 08/28/2017   Procedure: TOTAL HIP ARTHROPLASTY ANTERIOR APPROACH;   Surgeon: Rod Can, MD;  Location: [facility];  Service: Orthopedics;  Laterality: Left;   Family History  Problem Relation Age of Onset  . Heart failure Mother   . Heart attack Mother   . Hypertension Mother   . Thyroid cancer Mother   . Stroke Father   . Hypertension Father   . Stroke Brother   . Breast cancer Neg Hx   . Colon cancer Neg Hx    Allergies as of 05/10/2020      Reactions   Ibandronate Sodium Other (See Comments)   Bones hurt   Pravachol [pravastatin Sodium] Other (See Comments)   Myalgias   Nsaids    anticoagulated   Ultram [tramadol Hcl] Nausea Only      Medication List       Accurate as of May 10, 2020 12:33 PM. If you have any questions, ask your nurse or doctor.        alendronate 70 MG tablet Commonly known as: FOSAMAX Take 1 tablet (70 mg total) by mouth every 7 (seven) days. Take with a full glass of water on an empty stomach.   amLODipine 10 MG tablet Commonly known as: NORVASC Take 1 tablet (10 mg total) by mouth daily.   atorvastatin 20 MG tablet Commonly known as: LIPITOR Take 1 tablet (20 mg total) by mouth daily.   benazepril 20 MG tablet Commonly known as: LOTENSIN Take 1.5 tablets (30 mg total) by mouth daily. Needs office visit prior to anymore refills.   calcium carbonate 1500 (600 Ca) MG Tabs tablet Commonly known as: OSCAL Take 3 tablets by mouth daily.   Centrum Silver tablet Take 1 tablet by mouth daily.   cholecalciferol 1000 units tablet Commonly known as: VITAMIN D Take 1,000 Units by mouth daily.   escitalopram 20 MG tablet Commonly known as: LEXAPRO Take 1 tablet (20 mg total) by mouth daily.   ezetimibe 10 MG tablet Commonly known as: ZETIA TAKE 1 TABLET(10 MG) BY MOUTH DAILY   furosemide 20 MG tablet Commonly known as: LASIX TAKE 1 TABLET BY MOUTH EVERY MONDAY, WEDNESDAY AND FRIDAY   gabapentin 100 MG capsule Commonly known as: NEURONTIN 1 cap in morning and 2 caps at night    HYDROcodone-acetaminophen 5-325 MG tablet Commonly known as: NORCO/VICODIN Take 1 tablet by mouth 2 (two) times daily as needed for moderate pain.   levothyroxine 75 MCG tablet Commonly known as: SYNTHROID TAKE 1 TABLET(75 MCG) BY MOUTH DAILY   metoprolol tartrate 50 MG tablet Commonly known as:  LOPRESSOR Take 0.5 tablets (25 mg total) by mouth 2 (two) times daily.   nitroGLYCERIN 0.4 MG SL tablet Commonly known as: NITROSTAT PLACE 1 TABLET UNDER THE TONGUE EVERY 5 MINUTES AS NEEDED FOR CHEST PAIN,(MAX 3 TABLETS)   PARoxetine 10 MG tablet Commonly known as: PAXIL Take 1 tablet (10 mg total) by mouth daily.   traZODone 100 MG tablet Commonly known as: DESYREL Take 1 tablet (100 mg total) by mouth at bedtime.   vitamin B-12 1000 MCG tablet Commonly known as: CYANOCOBALAMIN Take 1,000 mcg by mouth daily.   warfarin 5 MG tablet Commonly known as: COUMADIN Take as directed by the anticoagulation clinic. If you are unsure how to take this medication, talk to your nurse or doctor. Original instructions: TAKE AS DIRECTED BY COUMADIN CLINIC       All past medical history, surgical history, allergies, family history, immunizations andmedications were updated in the EMR today and reviewed under the history and medication portions of their EMR.     ROS: Negative, with the exception of above mentioned in HPI   Objective:  BP 121/75   Pulse (!) 59   Temp 98.2 F (36.8 C) (Oral)   Ht 5\' 4"  (1.626 m)   Wt 193 lb (87.5 kg)   SpO2 96%   BMI 33.13 kg/m  Body mass index is 33.13 kg/m. Gen: Afebrile. No acute distress. Nontoxic in appearance, well developed, well nourished.  HENT: AT. [place].  Eyes:Pupils Equal Round Reactive to light, Extraocular movements intact,  Conjunctiva without redness, discharge or icterus. MSK: Right ankle/foot: No bruising, no erythema.  No tenderness to palpation.  5 x 4 cm spongy/cystic-like palpable mass over lateral malleolus.  Full range of motion.   Neurovascularly intact distally. Skin: No rashes, purpura or petechiae.  Neuro: Normal gait. PERLA. EOMi. Alert. Oriented x3  Psych: Normal affect, dress and demeanor. Normal speech. Normal thought content and judgment.  No exam data present No results found. No results found for this or any previous visit (from the past 24 hour(s)).  Assessment/Plan: Jennifer House is a 75 y.o. female present for OV for  Mass of joint of right foot/Right foot pain Possible cyst formation over the lateral malleolus versus soft tissue swelling due to injury.  She reports it is not very painful normally, just sometimes when she puts weight on the ankle.  We will start with x-rays to see if there is an underlying injury or simply cystic formation.  And then proceed with referral once more information is obtained. - DG Ankle Complete Right; Future - DG Foot Complete Right; Future Follow-up dependent upon x-ray results.    Reviewed expectations re: course of current medical issues.  Discussed self-management of symptoms.  Outlined signs and symptoms indicating need for more acute intervention.  Patient verbalized understanding and all questions were answered.  Patient received an After-Visit Summary.    Orders Placed This Encounter  Procedures  . DG Ankle Complete Right  . DG Foot Complete Right   No orders of the defined types were placed in this encounter.  Referral Orders  No referral(s) requested today     Note is dictated utilizing voice recognition software. Although note has been proof read prior to signing, occasional typographical errors still can be missed. If any questions arise, please do not hesitate to call for verification.   electronically signed by:  Howard Pouch, DO  [place]

## 2020-05-12 ENCOUNTER — Telehealth: Payer: Self-pay | Admitting: Family Medicine

## 2020-05-12 ENCOUNTER — Ambulatory Visit: Payer: Medicare Other

## 2020-05-12 ENCOUNTER — Other Ambulatory Visit: Payer: Self-pay

## 2020-05-12 DIAGNOSIS — Z5181 Encounter for therapeutic drug level monitoring: Secondary | ICD-10-CM | POA: Diagnosis not present

## 2020-05-12 DIAGNOSIS — M79671 Pain in right foot: Secondary | ICD-10-CM

## 2020-05-12 DIAGNOSIS — Z7901 Long term (current) use of anticoagulants: Secondary | ICD-10-CM | POA: Diagnosis not present

## 2020-05-12 DIAGNOSIS — M25871 Other specified joint disorders, right ankle and foot: Secondary | ICD-10-CM

## 2020-05-12 DIAGNOSIS — I4891 Unspecified atrial fibrillation: Secondary | ICD-10-CM

## 2020-05-12 LAB — POCT INR: INR: 3.7 — AB (ref 2.0–3.0)

## 2020-05-12 NOTE — Telephone Encounter (Signed)
Please inform patient the following information: Her ankle/foot x-rays did not show any fractures. It did show some possible fluid within the ankle joint and some mild changes to some of the tendons surrounding the ankle bones. The mass on her foot appeared to be more soft tissue than cystic. I believe she is best suited to be evaluated by orthopedics for this condition and treatment.

## 2020-05-12 NOTE — Patient Instructions (Signed)
-   have a large serving of greens today, - skip warfarin tonight, then - Continue on same dosage 1 tablet warfarin every day.  - Recheck INR in 4 weeks.  # [phone] Coumadin clinic. Main # [phone].

## 2020-05-12 NOTE — Telephone Encounter (Signed)
Patient advised and voiced understanding.  

## 2020-06-09 ENCOUNTER — Ambulatory Visit: Payer: Medicare Other

## 2020-06-09 ENCOUNTER — Other Ambulatory Visit: Payer: Self-pay

## 2020-06-09 DIAGNOSIS — I4811 Longstanding persistent atrial fibrillation: Secondary | ICD-10-CM | POA: Diagnosis not present

## 2020-06-09 DIAGNOSIS — Z5181 Encounter for therapeutic drug level monitoring: Secondary | ICD-10-CM | POA: Diagnosis not present

## 2020-06-09 DIAGNOSIS — Z7901 Long term (current) use of anticoagulants: Secondary | ICD-10-CM

## 2020-06-09 LAB — POCT INR: INR: 2.3 (ref 2.0–3.0)

## 2020-06-09 NOTE — Patient Instructions (Signed)
-   Continue on same dosage 1 tablet warfarin every day.  - Recheck INR in 5 weeks.  # [phone] Coumadin clinic. Main # [phone].

## 2020-06-24 ENCOUNTER — Other Ambulatory Visit: Payer: Self-pay | Admitting: Family Medicine

## 2020-07-14 ENCOUNTER — Ambulatory Visit: Payer: Medicare Other | Admitting: *Deleted

## 2020-07-14 ENCOUNTER — Other Ambulatory Visit: Payer: Self-pay

## 2020-07-14 DIAGNOSIS — I4891 Unspecified atrial fibrillation: Secondary | ICD-10-CM

## 2020-07-14 DIAGNOSIS — Z7901 Long term (current) use of anticoagulants: Secondary | ICD-10-CM

## 2020-07-14 LAB — POCT INR: INR: 2.2 (ref 2.0–3.0)

## 2020-07-14 NOTE — Patient Instructions (Signed)
Description   Continue taking Warfarin 1 tablet warfarin every day. Recheck INR in 6 weeks.  #[phone] Coumadin clinic. Main # [phone].      

## 2020-07-18 ENCOUNTER — Other Ambulatory Visit: Payer: Self-pay | Admitting: Family Medicine

## 2020-07-18 DIAGNOSIS — G4700 Insomnia, unspecified: Secondary | ICD-10-CM

## 2020-07-30 ENCOUNTER — Other Ambulatory Visit: Payer: Self-pay | Admitting: Internal Medicine

## 2020-08-16 ENCOUNTER — Other Ambulatory Visit: Payer: Self-pay

## 2020-08-16 DIAGNOSIS — G4700 Insomnia, unspecified: Secondary | ICD-10-CM

## 2020-08-23 ENCOUNTER — Other Ambulatory Visit: Payer: Self-pay

## 2020-08-23 DIAGNOSIS — G4700 Insomnia, unspecified: Secondary | ICD-10-CM

## 2020-08-25 ENCOUNTER — Other Ambulatory Visit: Payer: Self-pay

## 2020-08-25 ENCOUNTER — Ambulatory Visit (INDEPENDENT_AMBULATORY_CARE_PROVIDER_SITE_OTHER): Payer: Medicare Other | Admitting: *Deleted

## 2020-08-25 DIAGNOSIS — I10 Essential (primary) hypertension: Secondary | ICD-10-CM

## 2020-08-25 DIAGNOSIS — Z7901 Long term (current) use of anticoagulants: Secondary | ICD-10-CM

## 2020-08-25 DIAGNOSIS — G4700 Insomnia, unspecified: Secondary | ICD-10-CM

## 2020-08-25 DIAGNOSIS — I4891 Unspecified atrial fibrillation: Secondary | ICD-10-CM | POA: Diagnosis not present

## 2020-08-25 LAB — POCT INR: INR: 2.3 (ref 2.0–3.0)

## 2020-08-25 MED ORDER — GABAPENTIN 100 MG PO CAPS: ORAL_CAPSULE | ORAL | 0 refills | Status: DC

## 2020-08-25 MED ORDER — METOPROLOL TARTRATE 50 MG PO TABS: 25.0000 mg | ORAL_TABLET | Freq: Two times a day (BID) | ORAL | 0 refills | Status: DC

## 2020-08-25 MED ORDER — AMLODIPINE BESYLATE 10 MG PO TABS: 10.0000 mg | ORAL_TABLET | Freq: Every day | ORAL | 0 refills | Status: DC

## 2020-08-25 NOTE — Patient Instructions (Signed)
Description   Continue taking Warfarin 1 tablet warfarin every day. Recheck INR in 6 weeks.  [phone] Coumadin clinic. Main # [phone].

## 2020-09-04 ENCOUNTER — Telehealth: Payer: Self-pay

## 2020-09-04 NOTE — Telephone Encounter (Signed)
Patient daughter, [facility]) called to refill meds and schedule appt. Patient is out of meds.  For the controlled medication, she is aware Dr. Raoul Pitch out of office and will not be reviewed until she returns. Appt was scheduled with Dr. Raoul Pitch on 09/15/20.    PARoxetine (PAXIL) 10 MG tablet [[id number]]    traZODone (DESYREL) 100 MG tablet [[id number]]   ________________________________________________________  Patient aware cannot be reviewed until Dr. Lucita Lora return to office.  HYDROcodone-acetaminophen (NORCO/VICODIN) 5-325 MG tablet [[id number]]

## 2020-09-04 NOTE — Telephone Encounter (Signed)
Please advise 

## 2020-09-05 NOTE — Telephone Encounter (Signed)
Refill her medication per protocol.  She made an appointment, she can have a 30-day prescription.  Except for the controlled substance in which she will need an appointment a face-to-face visit in order for me to refill by law.

## 2020-09-05 NOTE — Telephone Encounter (Signed)
Spoke with pt daughter regarding medication and instructions.

## 2020-09-14 ENCOUNTER — Other Ambulatory Visit: Payer: Self-pay

## 2020-09-14 DIAGNOSIS — F331 Major depressive disorder, recurrent, moderate: Secondary | ICD-10-CM

## 2020-09-14 MED ORDER — ESCITALOPRAM OXALATE 20 MG PO TABS: 20.0000 mg | ORAL_TABLET | Freq: Every day | ORAL | 0 refills | Status: DC

## 2020-09-15 ENCOUNTER — Other Ambulatory Visit: Payer: Self-pay | Admitting: Internal Medicine

## 2020-09-15 ENCOUNTER — Other Ambulatory Visit: Payer: Self-pay

## 2020-09-15 ENCOUNTER — Encounter: Payer: Self-pay | Admitting: Family Medicine

## 2020-09-15 ENCOUNTER — Ambulatory Visit (INDEPENDENT_AMBULATORY_CARE_PROVIDER_SITE_OTHER): Payer: Medicare Other | Admitting: Family Medicine

## 2020-09-15 VITALS — BP 123/71 | HR 53 | Temp 98.2°F | Ht 64.0 in | Wt 194.0 lb

## 2020-09-15 DIAGNOSIS — E559 Vitamin D deficiency, unspecified: Secondary | ICD-10-CM | POA: Diagnosis not present

## 2020-09-15 DIAGNOSIS — I10 Essential (primary) hypertension: Secondary | ICD-10-CM | POA: Diagnosis not present

## 2020-09-15 DIAGNOSIS — G4700 Insomnia, unspecified: Secondary | ICD-10-CM

## 2020-09-15 DIAGNOSIS — I700 Atherosclerosis of aorta: Secondary | ICD-10-CM

## 2020-09-15 DIAGNOSIS — E039 Hypothyroidism, unspecified: Secondary | ICD-10-CM

## 2020-09-15 DIAGNOSIS — M1990 Unspecified osteoarthritis, unspecified site: Secondary | ICD-10-CM

## 2020-09-15 DIAGNOSIS — Z7901 Long term (current) use of anticoagulants: Secondary | ICD-10-CM

## 2020-09-15 DIAGNOSIS — F331 Major depressive disorder, recurrent, moderate: Secondary | ICD-10-CM

## 2020-09-15 DIAGNOSIS — E785 Hyperlipidemia, unspecified: Secondary | ICD-10-CM | POA: Diagnosis not present

## 2020-09-15 DIAGNOSIS — G8929 Other chronic pain: Secondary | ICD-10-CM

## 2020-09-15 DIAGNOSIS — M47816 Spondylosis without myelopathy or radiculopathy, lumbar region: Secondary | ICD-10-CM

## 2020-09-15 DIAGNOSIS — E213 Hyperparathyroidism, unspecified: Secondary | ICD-10-CM | POA: Diagnosis not present

## 2020-09-15 DIAGNOSIS — D6869 Other thrombophilia: Secondary | ICD-10-CM | POA: Insufficient documentation

## 2020-09-15 DIAGNOSIS — M8580 Other specified disorders of bone density and structure, unspecified site: Secondary | ICD-10-CM

## 2020-09-15 DIAGNOSIS — I2510 Atherosclerotic heart disease of native coronary artery without angina pectoris: Secondary | ICD-10-CM | POA: Diagnosis not present

## 2020-09-15 DIAGNOSIS — M4316 Spondylolisthesis, lumbar region: Secondary | ICD-10-CM

## 2020-09-15 DIAGNOSIS — I4891 Unspecified atrial fibrillation: Secondary | ICD-10-CM | POA: Diagnosis not present

## 2020-09-15 DIAGNOSIS — M47814 Spondylosis without myelopathy or radiculopathy, thoracic region: Secondary | ICD-10-CM

## 2020-09-15 LAB — COMPREHENSIVE METABOLIC PANEL
ALT: 25 U/L (ref 0–35)
AST: 23 U/L (ref 0–37)
Albumin: 4.3 g/dL (ref 3.5–5.2)
Alkaline Phosphatase: 79 U/L (ref 39–117)
BUN: 15 mg/dL (ref 6–23)
CO2: 31 mEq/L (ref 19–32)
Calcium: 9.7 mg/dL (ref 8.4–10.5)
Chloride: 104 mEq/L (ref 96–112)
Creatinine, Ser: 0.94 mg/dL (ref 0.40–1.20)
GFR: 58.72 mL/min — ABNORMAL LOW (ref >60.00)
Glucose, Bld: 100 mg/dL — ABNORMAL HIGH (ref 70–99)
Potassium: 5 mEq/L (ref 3.5–5.1)
Sodium: 142 mEq/L (ref 135–145)
Total Bilirubin: 0.6 mg/dL (ref 0.2–1.2)
Total Protein: 6.9 g/dL (ref 6.0–8.3)

## 2020-09-15 LAB — LIPID PANEL
Cholesterol: 126 mg/dL (ref 0–200)
HDL: 49.3 mg/dL (ref >39.00)
LDL Cholesterol: 56 mg/dL (ref 0–99)
NonHDL: 77.16
Total CHOL/HDL Ratio: 3
Triglycerides: 107 mg/dL (ref 0.0–149.0)
VLDL: 21.4 mg/dL (ref 0.0–40.0)

## 2020-09-15 LAB — CBC WITH DIFFERENTIAL/PLATELET
Basophils Absolute: 0.1 10*3/uL (ref 0.0–0.1)
Basophils Relative: 1.2 % (ref 0.0–3.0)
Eosinophils Absolute: 0.1 10*3/uL (ref 0.0–0.7)
Eosinophils Relative: 3 % (ref 0.0–5.0)
HCT: 41.9 % (ref 36.0–46.0)
Hemoglobin: 13.8 g/dL (ref 12.0–15.0)
Lymphocytes Relative: 26.8 % (ref 12.0–46.0)
Lymphs Abs: 1.2 10*3/uL (ref 0.7–4.0)
MCHC: 32.9 g/dL (ref 30.0–36.0)
MCV: 81 fl (ref 78.0–100.0)
Monocytes Absolute: 0.3 10*3/uL (ref 0.1–1.0)
Monocytes Relative: 6.4 % (ref 3.0–12.0)
Neutro Abs: 2.9 10*3/uL (ref 1.4–7.7)
Neutrophils Relative %: 62.6 % (ref 43.0–77.0)
Platelets: 169 10*3/uL (ref 150.0–400.0)
RBC: 5.18 Mil/uL — ABNORMAL HIGH (ref 3.87–5.11)
RDW: 14.7 % (ref 11.5–15.5)
WBC: 4.6 10*3/uL (ref 4.0–10.5)

## 2020-09-15 LAB — TSH: TSH: 3.29 u[IU]/mL (ref 0.35–5.50)

## 2020-09-15 LAB — VITAMIN D 25 HYDROXY (VIT D DEFICIENCY, FRACTURES): VITD: 21.88 ng/mL — ABNORMAL LOW (ref 30.00–100.00)

## 2020-09-15 MED ORDER — AMLODIPINE BESYLATE 10 MG PO TABS: 10.0000 mg | ORAL_TABLET | Freq: Every day | ORAL | 1 refills | Status: DC

## 2020-09-15 MED ORDER — METOPROLOL TARTRATE 50 MG PO TABS: 25.0000 mg | ORAL_TABLET | Freq: Two times a day (BID) | ORAL | 1 refills | Status: DC

## 2020-09-15 MED ORDER — TRAZODONE HCL 100 MG PO TABS
100.0000 mg | ORAL_TABLET | Freq: Every day | ORAL | 1 refills | Status: DC
Start: 2020-09-15 — End: 2021-03-13

## 2020-09-15 MED ORDER — EZETIMIBE 10 MG PO TABS
ORAL_TABLET | ORAL | 3 refills | Status: DC
Start: 2020-09-15 — End: 2021-04-25

## 2020-09-15 MED ORDER — BENAZEPRIL HCL 20 MG PO TABS: 30.0000 mg | ORAL_TABLET | Freq: Every day | ORAL | 1 refills | Status: DC

## 2020-09-15 MED ORDER — GABAPENTIN 100 MG PO CAPS: ORAL_CAPSULE | ORAL | 5 refills | Status: DC

## 2020-09-15 MED ORDER — LEVOTHYROXINE SODIUM 75 MCG PO TABS
ORAL_TABLET | ORAL | 3 refills | Status: DC
Start: 2020-09-15 — End: 2021-04-25

## 2020-09-15 MED ORDER — ESCITALOPRAM OXALATE 20 MG PO TABS: 20.0000 mg | ORAL_TABLET | Freq: Every day | ORAL | 1 refills | Status: DC

## 2020-09-15 MED ORDER — ATORVASTATIN CALCIUM 20 MG PO TABS: 20.0000 mg | ORAL_TABLET | Freq: Every day | ORAL | 3 refills | Status: DC

## 2020-09-15 MED ORDER — FUROSEMIDE 20 MG PO TABS: ORAL_TABLET | ORAL | 3 refills | Status: DC

## 2020-09-15 MED ORDER — PAROXETINE HCL 20 MG PO TABS: 20.0000 mg | ORAL_TABLET | Freq: Every day | ORAL | 1 refills | Status: DC

## 2020-09-15 MED ORDER — HYDROCODONE-ACETAMINOPHEN 5-325 MG PO TABS: 1.0000 | ORAL_TABLET | Freq: Two times a day (BID) | ORAL | 0 refills | Status: DC | PRN

## 2020-09-15 NOTE — Progress Notes (Addendum)
Jennifer House , 09/01/1945, 75 y.o., female MRN: [id number] Patient Care Team    Relationship Specialty Notifications Start End  Ma Hillock, DO PCP - General Family Medicine  01/23/15   Thompson Grayer, MD Attending Physician Cardiology  07/25/11   Ronald Lobo, MD Consulting Physician Gastroenterology  08/25/15   Rana Snare, MD (Inactive) Consulting Physician Urology  08/25/15   Renelda Loma, OD  Optometry  08/25/15    Comment: "Jennifer House" at [facility]    Chief Complaint  Patient presents with   Hypertension    [facility]; pt is not fasting      Subjective:Jennifer House is a 75 y.o. female present for follow-up on  Hypertension/hyperlipidemia/hypertriglyceridemia/A. fib: Pt reports compliance with Benzapril 30 mg, amlodipine 10 mg, metoprolol 25 mg twice a day, Lasix 20 mg (MWF) today. Patient denies chest pain, shortness of breath, dizziness or lower extremity edema.  Pt is on warfarin therapy per cardiology. Pt is prescribed Zetia and tolerating the addition of statin.  Diet: Low-sodium Exercise: Tries to exercise  RF: Hypertension, hyperlipidemia, family history of heart disease and stroke, personal history A. Fib, obesity, former smoker  Depression/insomnia: Patient again reports increased anxiety today despite starting Paxil 10 mg at her last appointment in addition to her trazodone and Lexapro doses.    Encounter for chronic pain/lumbar arthritis: tramadol not effective.  Vicodin helping with her pain and helps her keep active.  She does not routinely take the daytime dose, I encouraged her to try half a tab if she is having pain in the day .  She takes medication only when she really needs it- uses tylenol most days.She is compliant with the gabapentin.  Indication for chronic opioid: arthritis in the thoracic and lumbar spine, most significantly in the lumbar spine Medication and dose: Vicodin 5-325 mg BID PRN # pills per month: 60 Last UDS date:  Up-to-date. Pain contract signed (Y/N): Y Date narcotic database last reviewed (include red flags): 09/15/20    Depression screen [facility] 2/9 09/15/2020 05/10/2020 01/13/2020 12/29/2019 12/31/2018  Decreased Interest 1 0 1 0 0  Down, Depressed, Hopeless 1 0 0 0 0  PHQ - 2 Score 2 0 1 0 0  Altered sleeping 0 - 1 - -  Tired, decreased energy 1 - 1 - -  Change in appetite 0 - 0 - -  Feeling bad or failure about yourself  0 - 1 - -  Trouble concentrating 0 - 0 - -  Moving slowly or fidgety/restless 0 - 1 - -  Suicidal thoughts 0 - 0 - -  PHQ-9 Score 3 - 5 - -  Difficult doing work/chores - - - - -    Allergies  Allergen Reactions   Ibandronate Sodium Other (See Comments)    Bones hurt   Pravachol [Pravastatin Sodium] Other (See Comments)    Myalgias    Nsaids     anticoagulated   Ultram [Tramadol Hcl] Nausea Only   Social History   Social History Narrative   Lives in [place], with husband [place].   Has 4 adult children , highest level of education is seventh grade.   Former smoker, denies recreational drugs or alcohol use.   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 Risa Grill   Closed displaced fracture of left femoral neck ([place]) 08/28/2017   Colon polyps    Depression    GERD (gastroesophageal reflux disease)    Headache(784.0)    hx of migraines   Hematuria    hx   HLD (hyperlipidemia)    mixed   HTN (hypertension)    Hypothyroidism    Insomnia    Liver function test abnormality    hx fatty liver   Medicare annual wellness visit, subsequent 08/25/2015   Murmur, cardiac    Nonalcoholic fatty liver disease     Obesity    Osteoarthritis    low back pain with left sciatica   Osteopenia    dexa 2013   Renal calculus    hx   RENAL CALCULUS, HX OF 01/31/2009   Qualifier: Diagnosis of  By: Selena Batten CMA, Jewel     Restless leg syndrome    Past Surgical History:  Procedure Laterality Date   APPENDECTOMY     CARDIAC CATHETERIZATION  1/12   revealed normal cors and LV function   COLONOSCOPY     cystocopy  10/17/03   left stent placement, TURBT   FOOT SURGERY     LUMBAR LAMINECTOMY/DECOMPRESSION MICRODISCECTOMY N/A 01/19/2013   Procedure: LUMBAR THREE FOUR LUMBAR LAMINECTOMY/DECOMPRESSION MICRODISCECTOMY 1 LEVEL;  Surgeon: Faythe Ghee, MD;  Location: [facility];  Service: Neurosurgery;  Laterality: N/A;   TOTAL ABDOMINAL HYSTERECTOMY  1985   TOTAL HIP ARTHROPLASTY Left 08/28/2017   Procedure: TOTAL HIP ARTHROPLASTY ANTERIOR APPROACH;  Surgeon: Rod Can, MD;  Location: [place];  Service: Orthopedics;  Laterality: Left;   Family History  Problem Relation Age of Onset   Heart failure Mother    Heart attack Mother    Hypertension Mother    Thyroid cancer Mother    Stroke Father    Hypertension Father    Stroke Brother    Breast cancer Neg Hx    Colon cancer Neg Hx    Allergies as of 09/15/2020       Reactions   Ibandronate Sodium Other (See Comments)   Bones hurt   Pravachol [pravastatin Sodium] Other (See Comments)   Myalgias   Nsaids    anticoagulated   Ultram [tramadol Hcl] Nausea Only        Medication List        Accurate as of September 15, 2020  5:31 PM. If you have any questions, ask your nurse or doctor.          alendronate 70 MG tablet Commonly known as: FOSAMAX Take 1 tablet (70 mg total) by mouth every 7 (seven) days. Take with a full glass of water on an empty stomach.   amLODipine 10 MG tablet Commonly known as: NORVASC Take 1 tablet (10 mg total) by mouth daily.   atorvastatin 20 MG tablet Commonly known as: LIPITOR Take 1 tablet (20 mg total) by mouth  daily.   benazepril 20 MG tablet Commonly known as: LOTENSIN Take 1.5 tablets (30 mg total) by mouth daily. What changed: additional instructions Changed by: Howard Pouch, DO   calcium carbonate 1500 (600 Ca) MG Tabs tablet Commonly known as: OSCAL Take 3 tablets by mouth daily.   Centrum Silver tablet Take 1 tablet by mouth  daily.   cholecalciferol 1000 units tablet Commonly known as: VITAMIN D Take 1,000 Units by mouth daily.   escitalopram 20 MG tablet Commonly known as: LEXAPRO Take 1 tablet (20 mg total) by mouth daily.   ezetimibe 10 MG tablet Commonly known as: ZETIA TAKE 1 TABLET(10 MG) BY MOUTH DAILY   furosemide 20 MG tablet Commonly known as: LASIX TAKE 1 TABLET BY MOUTH EVERY MONDAY, WEDNESDAY AND FRIDAY   gabapentin 100 MG capsule Commonly known as: NEURONTIN 1 cap in morning and 2 caps at night   HYDROcodone-acetaminophen 5-325 MG tablet Commonly known as: NORCO/VICODIN Take 1 tablet by mouth 2 (two) times daily as needed for moderate pain.   levothyroxine 75 MCG tablet Commonly known as: SYNTHROID TAKE 1 TABLET(75 MCG) BY MOUTH DAILY   metoprolol tartrate 50 MG tablet Commonly known as: LOPRESSOR Take 0.5 tablets (25 mg total) by mouth 2 (two) times daily.   nitroGLYCERIN 0.4 MG SL tablet Commonly known as: NITROSTAT PLACE 1 TABLET UNDER THE TONGUE EVERY 5 MINUTES AS NEEDED FOR CHEST PAIN,(MAX 3 TABLETS)   PARoxetine 20 MG tablet Commonly known as: PAXIL Take 1 tablet (20 mg total) by mouth daily. What changed:  medication strength See the new instructions. Changed by: Howard Pouch, DO   traZODone 100 MG tablet Commonly known as: DESYREL Take 1 tablet (100 mg total) by mouth at bedtime. What changed: See the new instructions. Changed by: Howard Pouch, DO   vitamin B-12 1000 MCG tablet Commonly known as: CYANOCOBALAMIN Take 1,000 mcg by mouth daily.   warfarin 5 MG tablet Commonly known as: COUMADIN Take as directed by the  anticoagulation clinic. If you are unsure how to take this medication, talk to your nurse or doctor. Original instructions: TAKE AS DIRECTED BY COUMADIN CLINIC        All past medical history, surgical history, allergies, family history, immunizations andmedications were updated in the EMR today and reviewed under the history and medication portions of their EMR.     ROS: Negative, with the exception of above mentioned in HPI   Objective:  BP 123/71   Pulse (!) 53   Temp 98.2 F (36.8 C) (Oral)   Ht _0  (1.626 m)   Wt 194 lb (88 kg)   SpO2 95%   BMI 33.30 kg/m  Body mass index is 33.3 kg/m. Gen: Afebrile. No acute distress.  Nontoxic, very pleasant female obese. HENT: AT. [place].  Eyes:Pupils Equal Round Reactive to light, Extraocular movements intact,  Conjunctiva without redness, discharge or icterus. Neck/lymp/endocrine: Supple, no lymphadenopathy, no thyromegaly CV: RRR no murmur, no edema, +2/4 P posterior tibialis pulses Chest: CTAB, no wheeze or crackles Neuro:  Normal gait. PERLA. EOMi. Alert. Oriented.  X3 Psych: Normal affect, dress and demeanor. Normal speech. Normal thought content and judgment..   Assessment/Plan: Chantele Corado is a 75 y.o. female present for OV for  Arthritis, lumbar spine/Osteoarthritis of thoracic spine, unspecified spinal osteoarthritis complication status/Spondylolisthesis at L3-L4 level/Encounter for chronic pain/morbid obesity Condition is stable.  She is still having afternoon discomfort on some days.  She really tries to not take medication during the day if at all possible.  Encouraged her maybe she would like to try taking a half of a tab in the day to help with pain.  Medication has increased her quality of life and is able to keep her active.  - Chronic pain script: continue  hydrocodone 5-325 mg BID PRn  #180 supplied.  - UDS UTD 12/2018 -  contract signed  - [place] controlled substance database reviewed  09/15/20 -Continue gabapentin 100 mg 1 tab morning and 2 tabs before bed. - she is tolerating.  - f/u after 3 months if needing refills.  Essential hypertension, benign/hyperlipidemia/A. Fib/ obesity/Atrial fibrillation, unspecified type (HCC)/Aortic atherosclerosis (HCC)/acquired thrombophilia/morbid obesity Stable -Continue benzapril to 30 mg daily -Continue amlodipine 10  -Continue Lasix MWF -Continue metoprolol 25 BID  -Continue Zetia 10 mg daily-Continue statin- tolerating.  - Continue follow-up with cardiology for atrial fibrillation and coumadin management - low salt diet, increase exercise.  -CBC, CMP, TSH and lipids collected today -Follow up 5.5 months . Hypothyroid: Labs due today. Will refill levothyroxine at appropriate dose after results TSH collected today   Hypocalcemia/Osteopenia, unspecified location/hyperparathyroidism - takes vit d 1000u daily. Last dexa 2013 - under the care of  endocrine.  -PTH, calcium and vitamin D collected today   Insomnia, unspecified type/ Moderate episode of recurrent major depressive disorder ([place]) Could use some additional coverage. -Continue trazodone 100 mg nightly -Continue lexapro 20 mg daily -Increase Paxil to 20 mg daily -5.5 months    Reviewed expectations re: course of current medical issues. Discussed self-management of symptoms. Outlined signs and symptoms indicating need for more acute intervention. Patient verbalized understanding and all questions were answered. Patient received an After-Visit Summary.   Orders Placed This Encounter  Procedures   CBC w/Diff   Comp Met (CMET)   TSH   PTH, Intact and Calcium   Vitamin D (25 hydroxy)   Lipid panel    Meds ordered this encounter  Medications   amLODipine (NORVASC) 10 MG tablet    Sig: Take 1 tablet (10 mg total) by mouth daily.    Dispense:  90 tablet    Refill:  1   benazepril (LOTENSIN) 20 MG tablet    Sig: Take 1.5 tablets (30 mg total) by mouth  daily.    Dispense:  135 tablet    Refill:  1   metoprolol tartrate (LOPRESSOR) 50 MG tablet    Sig: Take 0.5 tablets (25 mg total) by mouth 2 (two) times daily.    Dispense:  180 tablet    Refill:  1   PARoxetine (PAXIL) 20 MG tablet    Sig: Take 1 tablet (20 mg total) by mouth daily.    Dispense:  90 tablet    Refill:  1   traZODone (DESYREL) 100 MG tablet    Sig: Take 1 tablet (100 mg total) by mouth at bedtime.    Dispense:  90 tablet    Refill:  1   escitalopram (LEXAPRO) 20 MG tablet    Sig: Take 1 tablet (20 mg total) by mouth daily.    Dispense:  90 tablet    Refill:  1   atorvastatin (LIPITOR) 20 MG tablet    Sig: Take 1 tablet (20 mg total) by mouth daily.    Dispense:  90 tablet    Refill:  3   ezetimibe (ZETIA) 10 MG tablet    Sig: TAKE 1 TABLET(10 MG) BY MOUTH DAILY    Dispense:  90 tablet    Refill:  3   furosemide (LASIX) 20 MG tablet    Sig: TAKE 1 TABLET BY MOUTH EVERY MONDAY, WEDNESDAY AND FRIDAY    Dispense:  45 tablet    Refill:  3   gabapentin (NEURONTIN) 100 MG capsule    Sig: 1 cap in morning and 2 caps at night    Dispense:  270 capsule  Refill:  5   HYDROcodone-acetaminophen (NORCO/VICODIN) 5-325 MG tablet    Sig: Take 1 tablet by mouth 2 (two) times daily as needed for moderate pain.    Dispense:  180 tablet    Refill:  0    Do not refill until after 10/15/2019   levothyroxine (SYNTHROID) 75 MCG tablet    Sig: TAKE 1 TABLET(75 MCG) BY MOUTH DAILY    Dispense:  90 tablet    Refill:  3      Note is dictated utilizing voice recognition software. Although note has been proof read prior to signing, occasional typographical errors still can be missed. If any questions arise, please do not hesitate to call for verification.   electronically signed by:  Howard Pouch, DO  [place]

## 2020-09-15 NOTE — Patient Instructions (Signed)
Great to see you today.  I have refilled the medication(s) we provide.   If labs were collected, we will inform you of lab results once received either by echart message or telephone call.   - echart message- for normal results that have been seen by the patient already.   - telephone call: abnormal results or if patient has not viewed results in their echart.  Happy July 4th.    Next appt in 3 months for pain management or 6 months on other chronic conditions.

## 2020-09-15 NOTE — Addendum Note (Signed)
Addended by: Howard Pouch A on: 09/15/2020 05:32 PM   Modules accepted: Orders

## 2020-09-19 LAB — PTH, INTACT AND CALCIUM: PTH: 128 pg/mL — ABNORMAL HIGH (ref 16–77)

## 2020-09-19 LAB — TIQ-NTM

## 2020-09-25 ENCOUNTER — Telehealth: Payer: Self-pay

## 2020-09-25 NOTE — Telephone Encounter (Signed)
Noted  

## 2020-09-25 NOTE — Telephone Encounter (Signed)
Requesting: HYDROcodone-acetaminophen (NORCO/VICODIN) 5-325 MG tablet Contract: 12/23/2018 UDS:12/23/2018 Last Visit:09/15/20 Next Visit: 01/03/21 Last Refill: 09/15/20 (180,0)  Please Advise, Rx can not be filled until 10/14/20  Advised pt that Rx was sent on 09/15/20 to be filled on the 30th. Pt says she should have enough to last until appt.

## 2020-10-06 ENCOUNTER — Other Ambulatory Visit: Payer: Self-pay

## 2020-10-06 ENCOUNTER — Ambulatory Visit: Payer: Medicare Other | Admitting: *Deleted

## 2020-10-06 DIAGNOSIS — I4891 Unspecified atrial fibrillation: Secondary | ICD-10-CM | POA: Diagnosis not present

## 2020-10-06 DIAGNOSIS — Z7901 Long term (current) use of anticoagulants: Secondary | ICD-10-CM

## 2020-10-06 LAB — POCT INR: INR: 2.4 (ref 2.0–3.0)

## 2020-10-06 NOTE — Patient Instructions (Signed)
Description   Continue taking Warfarin 1 tablet warfarin every day. Recheck INR in 6 weeks.  [phone] Coumadin clinic. Main # [phone].

## 2020-10-11 ENCOUNTER — Telehealth: Payer: Self-pay

## 2020-10-11 NOTE — Telephone Encounter (Signed)
Patient daughter, [facility]), calling in regards to her pain medication refill.  She was told by pharmacy that patient could get not med filled until 10/15/2020 per Dr. Raoul Pitch. Daughter wanting to know why?  Patient daughter stated the medication has not been filled since December 2021, she has bottle.  Patient only takes as needed.  She is out of meds and needs very bad.  Daughter states she is in a lot of pain.  I told her that clinical staff is at lunch at this time, she is going to check with [facility] to see what she can find out. Daughter stated that she did not pick up prescription from pharmacy on 09/15/2020, nor authorize it to be filled at that time.  Lalonnie can be reached at [phone].

## 2020-10-11 NOTE — Telephone Encounter (Signed)
Prescription is for   HYDROcodone-acetaminophen (NORCO/VICODIN) 5-325 MG tablet [id number]

## 2020-10-11 NOTE — Telephone Encounter (Signed)
Informed pt daughter that the rx note says 2021 not 2022

## 2020-10-11 NOTE — Telephone Encounter (Signed)
Informed pharmacy of same information

## 2020-10-12 ENCOUNTER — Other Ambulatory Visit: Payer: Self-pay

## 2020-10-12 ENCOUNTER — Emergency Department (HOSPITAL_BASED_OUTPATIENT_CLINIC_OR_DEPARTMENT_OTHER): Payer: Medicare Other | Admitting: Radiology

## 2020-10-12 ENCOUNTER — Emergency Department (HOSPITAL_BASED_OUTPATIENT_CLINIC_OR_DEPARTMENT_OTHER): Payer: Medicare Other

## 2020-10-12 ENCOUNTER — Encounter (HOSPITAL_BASED_OUTPATIENT_CLINIC_OR_DEPARTMENT_OTHER): Payer: Self-pay | Admitting: *Deleted

## 2020-10-12 ENCOUNTER — Emergency Department (HOSPITAL_BASED_OUTPATIENT_CLINIC_OR_DEPARTMENT_OTHER)
Admission: EM | Admit: 2020-10-12 | Discharge: 2020-10-12 | Disposition: A | Discharge status: Home / Self Care | Payer: Medicare Other | Attending: Emergency Medicine | Admitting: Emergency Medicine

## 2020-10-12 DIAGNOSIS — Z28310 Unvaccinated for covid-19: Secondary | ICD-10-CM | POA: Diagnosis not present

## 2020-10-12 DIAGNOSIS — R0602 Shortness of breath: Secondary | ICD-10-CM | POA: Diagnosis not present

## 2020-10-12 DIAGNOSIS — I10 Essential (primary) hypertension: Secondary | ICD-10-CM | POA: Insufficient documentation

## 2020-10-12 DIAGNOSIS — S0003XA Contusion of scalp, initial encounter: Secondary | ICD-10-CM | POA: Diagnosis not present

## 2020-10-12 DIAGNOSIS — Z96642 Presence of left artificial hip joint: Secondary | ICD-10-CM | POA: Diagnosis not present

## 2020-10-12 DIAGNOSIS — Z79899 Other long term (current) drug therapy: Secondary | ICD-10-CM | POA: Diagnosis not present

## 2020-10-12 DIAGNOSIS — M25552 Pain in left hip: Secondary | ICD-10-CM | POA: Diagnosis not present

## 2020-10-12 DIAGNOSIS — M79605 Pain in left leg: Secondary | ICD-10-CM | POA: Insufficient documentation

## 2020-10-12 DIAGNOSIS — J189 Pneumonia, unspecified organism: Secondary | ICD-10-CM

## 2020-10-12 DIAGNOSIS — Z8551 Personal history of malignant neoplasm of bladder: Secondary | ICD-10-CM | POA: Insufficient documentation

## 2020-10-12 DIAGNOSIS — M549 Dorsalgia, unspecified: Secondary | ICD-10-CM | POA: Insufficient documentation

## 2020-10-12 DIAGNOSIS — I4891 Unspecified atrial fibrillation: Secondary | ICD-10-CM | POA: Insufficient documentation

## 2020-10-12 DIAGNOSIS — J1282 Pneumonia due to coronavirus disease 2019: Secondary | ICD-10-CM | POA: Diagnosis not present

## 2020-10-12 DIAGNOSIS — W19XXXA Unspecified fall, initial encounter: Secondary | ICD-10-CM | POA: Diagnosis not present

## 2020-10-12 DIAGNOSIS — Y92002 Bathroom of unspecified non-institutional (private) residence single-family (private) house as the place of occurrence of the external cause: Secondary | ICD-10-CM | POA: Insufficient documentation

## 2020-10-12 DIAGNOSIS — Z7901 Long term (current) use of anticoagulants: Secondary | ICD-10-CM | POA: Diagnosis not present

## 2020-10-12 DIAGNOSIS — R531 Weakness: Secondary | ICD-10-CM | POA: Insufficient documentation

## 2020-10-12 DIAGNOSIS — U071 COVID-19: Secondary | ICD-10-CM

## 2020-10-12 DIAGNOSIS — E039 Hypothyroidism, unspecified: Secondary | ICD-10-CM | POA: Insufficient documentation

## 2020-10-12 DIAGNOSIS — M542 Cervicalgia: Secondary | ICD-10-CM | POA: Insufficient documentation

## 2020-10-12 DIAGNOSIS — J181 Lobar pneumonia, unspecified organism: Secondary | ICD-10-CM | POA: Diagnosis not present

## 2020-10-12 DIAGNOSIS — M545 Low back pain, unspecified: Secondary | ICD-10-CM | POA: Diagnosis not present

## 2020-10-12 DIAGNOSIS — I517 Cardiomegaly: Secondary | ICD-10-CM | POA: Diagnosis not present

## 2020-10-12 LAB — CBC
HCT: 43.1 % (ref 36.0–46.0)
Hemoglobin: 13.8 g/dL (ref 12.0–15.0)
MCH: 26.2 pg (ref 26.0–34.0)
MCHC: 32 g/dL (ref 30.0–36.0)
MCV: 81.8 fL (ref 80.0–100.0)
Platelets: 120 10*3/uL — ABNORMAL LOW (ref 150–400)
RBC: 5.27 MIL/uL — ABNORMAL HIGH (ref 3.87–5.11)
RDW: 14.5 % (ref 11.5–15.5)
WBC: 6.5 10*3/uL (ref 4.0–10.5)
nRBC: 0 % (ref 0.0–0.2)

## 2020-10-12 LAB — HEPATIC FUNCTION PANEL
ALT: 38 U/L (ref 0–44)
AST: 50 U/L — ABNORMAL HIGH (ref 15–41)
Albumin: 4.3 g/dL (ref 3.5–5.0)
Alkaline Phosphatase: 57 U/L (ref 38–126)
Bilirubin, Direct: 0.1 mg/dL (ref 0.0–0.2)
Indirect Bilirubin: 0.4 mg/dL (ref 0.3–0.9)
Total Bilirubin: 0.5 mg/dL (ref 0.3–1.2)
Total Protein: 7.3 g/dL (ref 6.5–8.1)

## 2020-10-12 LAB — URINALYSIS, ROUTINE W REFLEX MICROSCOPIC
Bilirubin Urine: NEGATIVE
Glucose, UA: NEGATIVE mg/dL
Ketones, ur: NEGATIVE mg/dL
Nitrite: NEGATIVE
Protein, ur: 30 mg/dL — AB
Specific Gravity, Urine: 1.017 (ref 1.005–1.030)
pH: 6 (ref 5.0–8.0)

## 2020-10-12 LAB — I-STAT VENOUS BLOOD GAS, ED
Acid-base deficit: 1 mmol/L (ref 0.0–2.0)
Bicarbonate: 24.4 mmol/L (ref 20.0–28.0)
Calcium, Ion: 1.16 mmol/L (ref 1.15–1.40)
HCT: 37 % (ref 36.0–46.0)
Hemoglobin: 12.6 g/dL (ref 12.0–15.0)
O2 Saturation: 67 %
Potassium: 3 mmol/L — ABNORMAL LOW (ref 3.5–5.1)
Sodium: 138 mmol/L (ref 135–145)
TCO2: 26 mmol/L (ref 22–32)
pCO2, Ven: 42.7 mmHg — ABNORMAL LOW (ref 44.0–60.0)
pH, Ven: 7.366 (ref 7.250–7.430)
pO2, Ven: 36 mmHg (ref 32.0–45.0)

## 2020-10-12 LAB — BASIC METABOLIC PANEL
Anion gap: 11 (ref 5–15)
BUN: 20 mg/dL (ref 8–23)
CO2: 26 mmol/L (ref 22–32)
Calcium: 8.9 mg/dL (ref 8.9–10.3)
Chloride: 99 mmol/L (ref 98–111)
Creatinine, Ser: 1.21 mg/dL — ABNORMAL HIGH (ref 0.44–1.00)
GFR, Estimated: 46 mL/min — ABNORMAL LOW (ref >60)
Glucose, Bld: 126 mg/dL — ABNORMAL HIGH (ref 70–99)
Potassium: 3.5 mmol/L (ref 3.5–5.1)
Sodium: 136 mmol/L (ref 135–145)

## 2020-10-12 LAB — TROPONIN I (HIGH SENSITIVITY)
Troponin I (High Sensitivity): 10 ng/L (ref <18)
Troponin I (High Sensitivity): 10 ng/L (ref <18)

## 2020-10-12 LAB — MAGNESIUM: Magnesium: 1.8 mg/dL (ref 1.7–2.4)

## 2020-10-12 LAB — TSH: TSH: 0.654 u[IU]/mL (ref 0.350–4.500)

## 2020-10-12 LAB — RESP PANEL BY RT-PCR (FLU A&B, COVID) ARPGX2
Influenza A by PCR: NEGATIVE
Influenza B by PCR: NEGATIVE
SARS Coronavirus 2 by RT PCR: POSITIVE — AB

## 2020-10-12 MED ORDER — DIPHENHYDRAMINE HCL 50 MG/ML IJ SOLN: 50.0000 mg | Freq: Once | INTRAMUSCULAR | Status: DC | PRN

## 2020-10-12 MED ORDER — EPINEPHRINE 0.3 MG/0.3ML IJ SOAJ: 0.3000 mg | Freq: Once | INTRAMUSCULAR | Status: DC | PRN

## 2020-10-12 MED ORDER — BEBTELOVIMAB 175 MG/2 ML IV (EUA): 175.0000 mg | Freq: Once | INTRAMUSCULAR | Status: DC

## 2020-10-12 MED ORDER — SODIUM CHLORIDE 0.9 % IV SOLN: INTRAVENOUS | Status: DC | PRN

## 2020-10-12 MED ORDER — AZITHROMYCIN 250 MG PO TABS: ORAL_TABLET | ORAL | 0 refills | Status: DC

## 2020-10-12 MED ORDER — METHYLPREDNISOLONE SODIUM SUCC 125 MG IJ SOLR: 125.0000 mg | Freq: Once | INTRAMUSCULAR | Status: DC | PRN

## 2020-10-12 MED ORDER — AZITHROMYCIN 250 MG PO TABS
500.0000 mg | ORAL_TABLET | Freq: Once | ORAL | Status: AC
  Administered 2020-10-12: 500 mg via ORAL
  Filled 2020-10-12: qty 2

## 2020-10-12 MED ORDER — AMOXICILLIN-POT CLAVULANATE 875-125 MG PO TABS: 1.0000 | ORAL_TABLET | Freq: Two times a day (BID) | ORAL | 0 refills | Status: DC

## 2020-10-12 MED ORDER — SODIUM CHLORIDE 0.9 % IV SOLN
1.0000 g | Freq: Once | INTRAVENOUS | Status: AC
  Administered 2020-10-12: 1 g via INTRAVENOUS
  Filled 2020-10-12: qty 10

## 2020-10-12 MED ORDER — BEBTELOVIMAB 175 MG/2 ML IV (EUA)
175.0000 mg | Freq: Once | INTRAMUSCULAR | Status: AC
  Administered 2020-10-12: 175 mg via INTRAVENOUS
  Filled 2020-10-12: qty 2

## 2020-10-12 MED ORDER — FAMOTIDINE IN NACL 20-0.9 MG/50ML-% IV SOLN: 20.0000 mg | Freq: Once | INTRAVENOUS | Status: DC | PRN

## 2020-10-12 MED ORDER — ALBUTEROL SULFATE HFA 108 (90 BASE) MCG/ACT IN AERS: 2.0000 | INHALATION_SPRAY | Freq: Once | RESPIRATORY_TRACT | Status: DC | PRN

## 2020-10-12 NOTE — ED Triage Notes (Signed)
Son states pt fell 2 days ago in bathroom, No LOC, pain in back, neck, left leg pain. Son assisted in getting pt up.

## 2020-10-12 NOTE — ED Notes (Signed)
Pt denis pain. Pt states that she is on warfarin

## 2020-10-12 NOTE — ED Provider Notes (Signed)
[facility] EMERGENCY DEPT Provider Note   CSN: [id number] Arrival date & time: 10/12/20  0946     History Chief Complaint  Patient presents with   Fall   Back Pain   Leg Pain   Neck Pain    Jennifer House is a 75 y.o. female.  HPI Patient is a poor historian.  She seems to have very poor short-term memory recall.  A lot of the history is coming from the patient's son.  He reports she fell off the toilet 2 days ago.  She been sitting on the toilet and then was on the floor.  Patient does not remember the episode.  Unclear if it was a syncope or mechanical fall.  Family members assisted her and she seemed to be all right.  She continued to do normal activities but then the following day, she would not get out of bed.  Seemingly she was too weak to get out of bed.  Her son reports her been some variable complaints of back pain and hip pain.  This has not been consistent.  They called EMS on Tuesday because she could not get out of bed, they assessed her and did not apparently find anything wrong.  Patient refused transport as well.  Patient reports after EMS left, she got out of bed and was walking around like there was nothing wrong.  She today was weak again in the morning and required assistance to get out of bed.  With the help of her daughter, she did take a shower.  Patient does not have any complaints.  She does not know why she is here.  She cannot answer the question as to what the concerns are of her son.  She also does not know when the last time she saw her husband was.  He had been put into an assisted living.  There are multiple questions regarding normal recall issues for activities of daily living that the patient defers to her son to answer.  She does not recall.    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 Risa Grill   Closed  displaced fracture of left femoral neck ([place]) 08/28/2017   Colon polyps    Depression    GERD (gastroesophageal reflux disease)    Headache(784.0)    hx of migraines   Hematuria    hx   HLD (hyperlipidemia)    mixed   HTN (hypertension)    Hypothyroidism    Insomnia    Liver function test abnormality    hx fatty liver   Medicare annual wellness visit, subsequent 08/25/2015   Murmur, cardiac    Nonalcoholic fatty liver disease    Obesity    Osteoarthritis    low back pain with left sciatica   Osteopenia    dexa 2013   Renal calculus    hx   RENAL CALCULUS, HX OF 01/31/2009   Qualifier: Diagnosis of  By: Selena Batten CMA, [place]     Restless leg syndrome     Patient Active Problem List   Diagnosis Date Noted   Morbid obesity ([place]) 09/15/2020   Acquired thrombophilia ([place]) 09/15/2020   Osteopenia of hip 02/19/2019   Encounter for chronic pain management 12/23/2018   Hyperparathyroidism ([place]) 12/17/2018   Spondylolisthesis at L3-L4 level 12/17/2018   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   Arthritis,  lumbar spine 12/17/2018   Aortic atherosclerosis ([place]) 09/09/2017   Atherosclerosis of native coronary artery of native heart without angina pectoris 09/09/2017   Hyperlipidemia LDL goal <100 08/25/2015   Vitamin D deficiency 08/25/2015   History of bladder cancer 08/25/2015   Long term (current) use of anticoagulants 06/25/2010   Atrial fibrillation ([place]) 06/28/2009   Hypothyroidism 01/31/2009   Depression 01/31/2009   Essential hypertension, benign 01/31/2009   Osteopenia 01/31/2009   Insomnia 01/31/2009    Past Surgical History:  Procedure Laterality Date   APPENDECTOMY     CARDIAC CATHETERIZATION  1/12   revealed normal cors and LV function   COLONOSCOPY     cystocopy  10/17/03   left stent placement, TURBT   FOOT SURGERY     LUMBAR LAMINECTOMY/DECOMPRESSION MICRODISCECTOMY N/A 01/19/2013   Procedure: LUMBAR THREE FOUR LUMBAR LAMINECTOMY/DECOMPRESSION  MICRODISCECTOMY 1 LEVEL;  Surgeon: Faythe Ghee, MD;  Location: [facility];  Service: Neurosurgery;  Laterality: N/A;   TOTAL ABDOMINAL HYSTERECTOMY  1985   TOTAL HIP ARTHROPLASTY Left 08/28/2017   Procedure: TOTAL HIP ARTHROPLASTY ANTERIOR APPROACH;  Surgeon: Rod Can, MD;  Location: [facility];  Service: Orthopedics;  Laterality: Left;     OB History   No obstetric history on file.     Family History  Problem Relation Age of Onset   Heart failure Mother    Heart attack Mother    Hypertension Mother    Thyroid cancer Mother    Stroke Father    Hypertension Father    Stroke Brother    Breast cancer Neg Hx    Colon cancer Neg Hx     Social History   Tobacco Use   Smoking status: Former    Types: Cigarettes    Quit date: 03/18/1985    Years since quitting: 35.5   Smokeless tobacco: Never   Tobacco comments:    denies   Vaping Use   Vaping Use: Never used  Substance Use Topics   Alcohol use: No   Drug use: No    Home Medications Prior to Admission medications   Medication Sig Start Date End Date Taking? Authorizing Provider  alendronate (FOSAMAX) 70 MG tablet Take 1 tablet (70 mg total) by mouth every 7 (seven) days. Take with a full glass of water on an empty stomach. 02/19/19  Yes Shamleffer, Melanie Crazier, MD  amLODipine (NORVASC) 10 MG tablet Take 1 tablet (10 mg total) by mouth daily. 09/15/20  Yes Kuneff, Renee A, DO  amoxicillin-clavulanate (AUGMENTIN) 875-125 MG tablet Take 1 tablet by mouth 2 (two) times daily. One po bid x 7 days 10/12/20  Yes Calirose Mccance, Jeannie Done, MD  atorvastatin (LIPITOR) 20 MG tablet Take 1 tablet (20 mg total) by mouth daily. 09/15/20  Yes Kuneff, Renee A, DO  benazepril (LOTENSIN) 20 MG tablet Take 1.5 tablets (30 mg total) by mouth daily. 09/15/20  Yes Kuneff, Renee A, DO  calcium carbonate (OSCAL) 1500 (600 Ca) MG TABS tablet Take 3 tablets by mouth daily.   Yes [provider]  cholecalciferol (VITAMIN D) 1000 UNITS tablet Take  1,000 Units by mouth daily.   Yes [provider]  escitalopram (LEXAPRO) 20 MG tablet Take 1 tablet (20 mg total) by mouth daily. 09/15/20  Yes Kuneff, Renee A, DO  ezetimibe (ZETIA) 10 MG tablet TAKE 1 TABLET(10 MG) BY MOUTH DAILY 09/15/20  Yes Kuneff, Renee A, DO  furosemide (LASIX) 20 MG tablet TAKE 1 TABLET BY MOUTH EVERY MONDAY, [facility] AND FRIDAY 09/15/20  Yes Kuneff, Renee A, DO  gabapentin (NEURONTIN) 100 MG capsule 1 cap in morning and 2 caps at night 09/15/20  Yes Kuneff, Renee A, DO  HYDROcodone-acetaminophen (NORCO/VICODIN) 5-325 MG tablet Take 1 tablet by mouth 2 (two) times daily as needed for moderate pain. 09/15/20  Yes Kuneff, Renee A, DO  levothyroxine (SYNTHROID) 75 MCG tablet TAKE 1 TABLET(75 MCG) BY MOUTH DAILY 09/15/20  Yes Kuneff, Renee A, DO  Multiple Vitamins-Minerals (CENTRUM SILVER) tablet Take 1 tablet by mouth daily.   Yes [provider]  nitroGLYCERIN (NITROSTAT) 0.4 MG SL tablet PLACE 1 TABLET UNDER THE TONGUE EVERY 5 MINUTES AS NEEDED FOR CHEST PAIN,(MAX 3 TABLETS) 01/13/20  Yes Kuneff, Renee A, DO  PARoxetine (PAXIL) 20 MG tablet Take 1 tablet (20 mg total) by mouth daily. 09/15/20  Yes Kuneff, Renee A, DO  traZODone (DESYREL) 100 MG tablet Take 1 tablet (100 mg total) by mouth at bedtime. 09/15/20  Yes Kuneff, Renee A, DO  vitamin B-12 (CYANOCOBALAMIN) 1000 MCG tablet Take 1,000 mcg by mouth daily.   Yes [provider]  warfarin (COUMADIN) 5 MG tablet TAKE AS DIRECTED BY COUMADIN CLINIC 09/15/20  Yes Allred, Jeneen Rinks, MD  metoprolol tartrate (LOPRESSOR) 50 MG tablet Take 0.5 tablets (25 mg total) by mouth 2 (two) times daily. 09/15/20   Kuneff, Renee A, DO    Allergies    Ibandronate sodium, Pravachol [pravastatin sodium], Nsaids, and Ultram [tramadol hcl]  Review of Systems   Review of Systems 10 systems reviewed negative except per HPI Physical Exam Updated Vital Signs BP (!) 129/59 (BP Location: Right Arm)   Pulse (!) 59   Temp 98.4 F (36.9 C)  (Oral)   Resp 16   Ht '5\' 2"'$  (1.575 m)   Wt 87.8 kg   SpO2 99%   BMI 35.41 kg/m   Physical Exam Constitutional:      Comments: Alert.  Nontoxic.  Well-nourished well-developed.  No distress.  No respiratory distress  HENT:     Head: Normocephalic and atraumatic.     Mouth/Throat:     Mouth: Mucous membranes are moist.     Pharynx: Oropharynx is clear.  Eyes:     Extraocular Movements: Extraocular movements intact.     Conjunctiva/sclera: Conjunctivae normal.     Pupils: Pupils are equal, round, and reactive to light.  Neck:     Comments: No C-spine tenderness. Cardiovascular:     Rate and Rhythm: Normal rate and regular rhythm.  Pulmonary:     Comments: Rhonchi left lung base clears with cough.  Patient has some occasional wet cough.  Lungs otherwise are grossly clear. Abdominal:     General: There is no distension.     Palpations: Abdomen is soft.     Tenderness: There is no abdominal tenderness. There is no guarding.  Musculoskeletal:        General: No swelling, tenderness or signs of injury. Normal range of motion.     Cervical back: Neck supple.     Right lower leg: No edema.     Left lower leg: No edema.     Comments: Patient is back is examined.  Palpated all of the spine and bony elements of the posterior pelvis.  Patient does not endorse any pain.  I have put patient has bilateral lower extremities through full range of motion with deep flexion at the hip knee and ankle to push against resistance without pain.  Patient uses both upper extremities symmetrically no deformities or apparent  injuries.  Skin:    General: Skin is warm and dry.  Neurological:     Comments: Patient has very poor short-term recall.  She can follow commands but does seem to have a little bit of difficulty processing.  This is suggestive of dementia but son denies history of dementia.  No focal motor deficits.  Patient can push pull lower extremities against resistance.  Patient can grip bilateral  bed rails to help elevate in the bed.  She does seem to have some amount of general weakness.  Cranial nerves intact.    ED Results / Procedures / Treatments   Labs (all labs ordered are listed, but only abnormal results are displayed) Labs Reviewed  RESP PANEL BY RT-PCR (FLU A&B, COVID) ARPGX2 - Abnormal; Notable for the following components:      Result Value   SARS Coronavirus 2 by RT PCR POSITIVE (*)    All other components within normal limits  BASIC METABOLIC PANEL - Abnormal; Notable for the following components:   Glucose, Bld 126 (*)    Creatinine, Ser 1.21 (*)    GFR, Estimated 46 (*)    All other components within normal limits  CBC - Abnormal; Notable for the following components:   RBC 5.27 (*)    Platelets 120 (*)    All other components within normal limits  URINALYSIS, ROUTINE W REFLEX MICROSCOPIC - Abnormal; Notable for the following components:   APPearance HAZY (*)    Hgb urine dipstick MODERATE (*)    Protein, ur 30 (*)    Leukocytes,Ua SMALL (*)    Bacteria, UA MANY (*)    All other components within normal limits  HEPATIC FUNCTION PANEL - Abnormal; Notable for the following components:   AST 50 (*)    All other components within normal limits  I-STAT VENOUS BLOOD GAS, ED - Abnormal; Notable for the following components:   pCO2, Ven 42.7 (*)    Potassium 3.0 (*)    All other components within normal limits  URINE CULTURE  MAGNESIUM  TSH  TROPONIN I (HIGH SENSITIVITY)  TROPONIN I (HIGH SENSITIVITY)    EKG EKG Interpretation  Date/Time:  Thursday October 12 2020 10:01:08 EDT Ventricular Rate:  71 PR Interval:    QRS Duration: 91 QT Interval:  348 QTC Calculation: 379 R Axis:   3 Text Interpretation: Atrial fibrillation Borderline repolarization abnormality agree, no sig change from previous Confirmed by Charlesetta Shanks [phone]) on 10/12/2020 3:16:30 PM  Radiology DG Lumbar Spine Complete  Result Date: 10/12/2020 CLINICAL DATA:  [place] 2 days ago.  Back pain. EXAM: LUMBAR SPINE - COMPLETE 4+ VIEW COMPARISON:  Radiographs 12/08/2018 FINDINGS: Stable scoliosis and degenerative lumbar spondylosis with multilevel disc disease and facet disease. No acute lumbar spine fracture. Lower lumbar facet disease but no definite pars defects. The visualized bony pelvis is intact. The SI joints appear normal. A left hip prosthesis is noted. IMPRESSION: Scoliosis and degenerative lumbar spondylosis with multilevel disc disease and facet disease but no acute bony findings. Electronically Signed   By: Marijo Sanes M.D.   On: 10/12/2020 11:46   CT Head Wo Contrast  Result Date: 10/12/2020 CLINICAL DATA:  Mental status changes. EXAM: CT HEAD WITHOUT CONTRAST TECHNIQUE: Contiguous axial images were obtained from the base of the skull through the vertex without intravenous contrast. COMPARISON:  Head CT 08/25/2017 FINDINGS: Brain: Slight progression of age related cerebral atrophy, ventriculomegaly and periventricular white matter disease. No extra-axial fluid collections are identified. No CT findings  for acute hemispheric infarction or intracranial hemorrhage. No mass lesions. The brainstem and cerebellum are normal. Vascular: Stable vascular calcifications. No aneurysm or hyperdense vessels. Skull: No skull fracture or bone lesions. Sinuses/Orbits: The paranasal sinuses and mastoid air cells are clear. Other: No scalp lesions or scalp hematoma. IMPRESSION: 1. Slight progression of age related cerebral atrophy, ventriculomegaly and periventricular white matter disease. 2. No acute intracranial findings or mass lesions. Electronically Signed   By: Marijo Sanes M.D.   On: 10/12/2020 11:49   DG Chest Portable 1 View  Result Date: 10/12/2020 CLINICAL DATA:  Shortness of breath EXAM: PORTABLE CHEST 1 VIEW COMPARISON:  04/22/2019 FINDINGS: Heart is borderline enlarged. Aortic atherosclerosis. Left lower lobe atelectasis or infiltrate. Right lung clear. No effusions or acute  bony abnormality. IMPRESSION: Borderline cardiomegaly.  Aortic atherosclerosis. Left lower lobe atelectasis or infiltrate. Electronically Signed   By: Rolm Baptise M.D.   On: 10/12/2020 10:29    Procedures Procedures   Medications Ordered in ED Medications  0.9 %  sodium chloride infusion (has no administration in time range)  diphenhydrAMINE (BENADRYL) injection 50 mg (has no administration in time range)  famotidine (PEPCID) IVPB 20 mg premix (has no administration in time range)  methylPREDNISolone sodium succinate (SOLU-MEDROL) 125 mg/2 mL injection 125 mg (has no administration in time range)  albuterol (VENTOLIN HFA) 108 (90 Base) MCG/ACT inhaler 2 puff (has no administration in time range)  EPINEPHrine (EPI-PEN) injection 0.3 mg (has no administration in time range)  cefTRIAXone (ROCEPHIN) 1 g in sodium chloride 0.9 % 100 mL IVPB (1 g Intravenous New Bag/Given 10/12/20 1253)  azithromycin (ZITHROMAX) tablet 500 mg (500 mg Oral Given 10/12/20 1248)  bebtelovimab EUA injection SOLN 175 mg (175 mg Intravenous Given 10/12/20 1408)    ED Course  I have reviewed the triage vital signs and the nursing notes.  Pertinent labs & imaging results that were available during my care of the patient were reviewed by me and considered in my medical decision making (see chart for details).    MDM Rules/Calculators/A&P                           Patient presents as outlined.  She is unvaccinated for COVID.  Patient did test positive for COVID.  Time of onset is based on patient feeling well over the weekend and going grocery shopping and then on Monday feeling weak and having a syncopal or near syncopal episode.  Patient does have objective cough as I examine her.  Patient's son who has been with her the whole time had not noticed her to have significant cough until just the past day.  At this time with patient unvaccinated and significant risk factors, have opted for immunoglobulin therapy.  Exact time  of onset is unclear however it appears to be at least less than 7 days.  Patient's chest x-ray also shows an infiltrate in the left base.  Her physical exam correlated with crackles in the base.  Patient is being treated empirically for secondary pneumonia.  However, she does not have any respiratory distress, no hypoxia and stable for continued outpatient treatment.  Patient is alert.  She is verbally interactive.  She has significant amount of short-term memory problems.  The evaluation suggests dementia type presentation however possibly secondary to acute illness.  Patient will need follow-up with PCP for continued monitoring of baseline mental status function.  Patient shows no signs of focal motor deficits.  CT scan was obtained to rule out any intracranial injury.  None present.  Patient has good assistance at home.  Stable for discharge with follow-up. Final Clinical Impression(s) / ED Diagnoses Final diagnoses:  COVID  General weakness  Community acquired pneumonia of left lower lobe of lung    Rx / DC Orders ED Discharge Orders          Ordered    BEBTELOVIMAB 175 MG/2 ML IV (EUA)   Once,   Status:  Discontinued        10/12/20 1328    SODIUM CHLORIDE 0.9 % IV SOLN  As needed,   Status:  Discontinued        10/12/20 1328    DIPHENHYDRAMINE HCL 50 MG/ML IJ SOLN  Once PRN,   Status:  Discontinued        10/12/20 1328    FAMOTIDINE IN NACL 20-0.9 MG/50ML-% IV SOLN  Once PRN,   Status:  Discontinued        10/12/20 1328    METHYLPREDNISOLONE SODIUM SUCC 125 MG IJ SOLR  Once PRN,   Status:  Discontinued        10/12/20 1328    ALBUTEROL SULFATE HFA 108 (90 BASE) MCG/ACT IN AERS  Once PRN,   Status:  Discontinued        10/12/20 1328    EPINEPHRINE 0.3 MG/0.3ML IJ SOAJ  Once PRN,   Status:  Discontinued        10/12/20 1328    Hypersensitivity GRADE 1: Transient flushing or rash, or drug fever < 100.4 F       Comments: Routine, As needed For Until specified Hold infusion for at  least 30 minutes Restart infusion at 50% infusion rate if asymptomatic   10/12/20 1328    Hypersensitivity GRADE 2: Rash, flushing, urticaria, dyspnea, or drug fever = or > 100.4 F       Comments: Routine, As needed For Until specified Hold infusion for 30 minutes Give medications as ordered   10/12/20 1328    Hypersensitivity GRADE 3: symptomatic bronchospasm, with or without urticaria, parenteral medication management indicated, allergy-related edema/angioedema, or hypotension       Comments: Routine, As needed For Until specified Hold infusion for at least 30 minutes Give medications as ordered   10/12/20 1328    Hypersensitivity GRADE 4: Anaphylaxis       Comments: Routine, As needed For Until specified Hold infusion Give medications as ordered Call MD   10/12/20 1328    azithromycin (ZITHROMAX) 250 MG tablet  Status:  Discontinued        10/12/20 1330    amoxicillin-clavulanate (AUGMENTIN) 875-125 MG tablet  2 times daily        10/12/20 1439             Charlesetta Shanks, MD 10/12/20 1519

## 2020-10-12 NOTE — ED Notes (Signed)
Redraw gold top sent to lab

## 2020-10-12 NOTE — Discharge Instructions (Addendum)
1.  Take acetaminophen (Tylenol) every 6 hours per package instructions for fever or body aches 2.  Stay hydrated.  Try to rest a lot.  Be careful if you are up and active.  Have assistance. 3.  You have COVID but you do not show signs of difficulty breathing, chest pain or dehydration.  Continue to manage at home per instructions.  Return to the emergency department if symptoms are worsening or new concerning symptoms develop.  You were given a treatment for COVID in the emergency department with an IV dose of monoclonal antibody therapy. 4.  You have been given antibiotics to take at home for signs of pneumonia on your chest x-ray and mild signs of urinary tract infection. The antibiotic you are to take is called Keflex. Recheck with your doctor within 3 to 5 days for continued monitoring.

## 2020-10-12 NOTE — ED Notes (Signed)
Patient states fells fine.  No signs of reaction to medication

## 2020-10-13 LAB — URINE CULTURE

## 2020-10-17 ENCOUNTER — Inpatient Hospital Stay: Payer: Medicare Other | Admitting: Family Medicine

## 2020-10-19 ENCOUNTER — Other Ambulatory Visit: Payer: Self-pay

## 2020-10-19 ENCOUNTER — Encounter: Payer: Self-pay | Admitting: Family Medicine

## 2020-10-19 ENCOUNTER — Ambulatory Visit (INDEPENDENT_AMBULATORY_CARE_PROVIDER_SITE_OTHER): Payer: Medicare Other | Admitting: Family Medicine

## 2020-10-19 VITALS — BP 98/62 | HR 75 | Temp 97.9°F | Ht 64.0 in | Wt 189.0 lb

## 2020-10-19 DIAGNOSIS — N179 Acute kidney failure, unspecified: Secondary | ICD-10-CM | POA: Insufficient documentation

## 2020-10-19 DIAGNOSIS — E876 Hypokalemia: Secondary | ICD-10-CM

## 2020-10-19 DIAGNOSIS — J181 Lobar pneumonia, unspecified organism: Secondary | ICD-10-CM | POA: Insufficient documentation

## 2020-10-19 DIAGNOSIS — U071 COVID-19: Secondary | ICD-10-CM | POA: Diagnosis not present

## 2020-10-19 DIAGNOSIS — J1282 Pneumonia due to coronavirus disease 2019: Secondary | ICD-10-CM | POA: Diagnosis not present

## 2020-10-19 DIAGNOSIS — Z6830 Body mass index (BMI) 30.0-30.9, adult: Secondary | ICD-10-CM | POA: Insufficient documentation

## 2020-10-19 DIAGNOSIS — W19XXXA Unspecified fall, initial encounter: Secondary | ICD-10-CM | POA: Diagnosis not present

## 2020-10-19 HISTORY — DX: Lobar pneumonia, unspecified organism: J18.1

## 2020-10-19 HISTORY — DX: Acute kidney failure, unspecified: N17.9

## 2020-10-19 LAB — BASIC METABOLIC PANEL
BUN: 12 mg/dL (ref 6–23)
CO2: 30 mEq/L (ref 19–32)
Calcium: 9.1 mg/dL (ref 8.4–10.5)
Chloride: 100 mEq/L (ref 96–112)
Creatinine, Ser: 0.88 mg/dL (ref 0.40–1.20)
GFR: 63.51 mL/min (ref >60.00)
Glucose, Bld: 104 mg/dL — ABNORMAL HIGH (ref 70–99)
Potassium: 3.3 mEq/L — ABNORMAL LOW (ref 3.5–5.1)
Sodium: 141 mEq/L (ref 135–145)

## 2020-10-19 MED ORDER — PREDNISONE 20 MG PO TABS: 40.0000 mg | ORAL_TABLET | Freq: Every day | ORAL | 0 refills | Status: DC

## 2020-10-19 NOTE — Progress Notes (Signed)
This visit occurred during the SARS-CoV-2 public health emergency.  Safety protocols were in place, including screening questions prior to the visit, additional usage of staff PPE, and extensive cleaning of exam room while observing appropriate contact time as indicated for disinfecting solutions.    Jennifer House , [birth year], 75 y.o., female MRN: [id number] Patient Care Team    Relationship Specialty Notifications Start End  Ma Hillock, DO PCP - General Family Medicine  01/23/15   Thompson Grayer, MD Attending Physician Cardiology  07/25/11   Ronald Lobo, MD Consulting Physician Gastroenterology  08/25/15   Rana Snare, MD (Inactive) Consulting Physician Urology  08/25/15   Renelda Loma, OD  Optometry  08/25/15    Comment: "kimberly" at [facility]    Chief Complaint  Patient presents with   Hospitalization Follow-up     Subjective: Pt presents for an OV to follow-up on ED visit.  Patient was taken to the emergency room after having profound weakness and was unable to get up out of bed on her own.  The night prior to her ED visit she had fallen in the bathroom.  She does not recall this fall or why.  She states she knows she was going to the bathroom, denies she was having a bowel movement at that time.  Her son was in the home at the time of her fall and was able to help her back to her bed.  In the ED she was found to have lobar pneumonia, COVID-positive and hypokalemia. Depression screen [facility] 2/9 09/15/2020 05/10/2020 01/13/2020 12/29/2019 12/31/2018  Decreased Interest 1 0 1 0 0  Down, Depressed, Hopeless 1 0 0 0 0  PHQ - 2 Score 2 0 1 0 0  Altered sleeping 0 - 1 - -  Tired, decreased energy 1 - 1 - -  Change in appetite 0 - 0 - -  Feeling bad or failure about yourself  0 - 1 - -  Trouble concentrating 0 - 0 - -  Moving slowly or fidgety/restless 0 - 1 - -  Suicidal thoughts 0 - 0 - -  PHQ-9 Score 3 - 5 - -  Difficult doing work/chores - - - - -  Some recent data  might be hidden    Allergies  Allergen Reactions   Ibandronate Sodium Other (See Comments)    Bones hurt   Pravachol [Pravastatin Sodium] Other (See Comments)    Myalgias    Nsaids     anticoagulated   Ultram [Tramadol Hcl] Nausea Only   Social History   Social History Narrative   Lives in [place], with husband [place].   Has 4 adult children , highest level of education is seventh grade.   Former smoker, denies recreational drugs or alcohol use.   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 Risa Grill   Closed displaced fracture of left femoral neck ([place]) 08/28/2017   Colon polyps    Depression    GERD (gastroesophageal reflux disease)    Headache(784.0)  hx of migraines   Hematuria    hx   HLD (hyperlipidemia)    mixed   HTN (hypertension)    Hypothyroidism    Insomnia    Liver function test abnormality    hx fatty liver   Medicare annual wellness visit, subsequent 08/25/2015   Murmur, cardiac    Nonalcoholic fatty liver disease    Obesity    Osteoarthritis    low back pain with left sciatica   Osteopenia    dexa 2013   Renal calculus    hx   RENAL CALCULUS, HX OF 01/31/2009   Qualifier: Diagnosis of  By: Selena Batten CMA, [place]     Restless leg syndrome    Past Surgical History:  Procedure Laterality Date   APPENDECTOMY     CARDIAC CATHETERIZATION  1/12   revealed normal cors and LV function   COLONOSCOPY     cystocopy  10/17/03   left stent placement, TURBT   FOOT SURGERY     LUMBAR LAMINECTOMY/DECOMPRESSION MICRODISCECTOMY N/A 01/19/2013   Procedure: LUMBAR THREE FOUR LUMBAR LAMINECTOMY/DECOMPRESSION  MICRODISCECTOMY 1 LEVEL;  Surgeon: Faythe Ghee, MD;  Location: [facility];  Service: Neurosurgery;  Laterality: N/A;   TOTAL ABDOMINAL HYSTERECTOMY  1985   TOTAL HIP ARTHROPLASTY Left 08/28/2017   Procedure: TOTAL HIP ARTHROPLASTY ANTERIOR APPROACH;  Surgeon: Rod Can, MD;  Location: [facility];  Service: Orthopedics;  Laterality: Left;   Family History  Problem Relation Age of Onset   Heart failure Mother    Heart attack Mother    Hypertension Mother    Thyroid cancer Mother    Stroke Father    Hypertension Father    Stroke Brother    Breast cancer Neg Hx    Colon cancer Neg Hx    Allergies as of 10/19/2020       Reactions   Ibandronate Sodium Other (See Comments)   Bones hurt   Pravachol [pravastatin Sodium] Other (See Comments)   Myalgias   Nsaids    anticoagulated   Ultram [tramadol Hcl] Nausea Only        Medication List        Accurate as of October 19, 2020 12:32 PM. If you have any questions, ask your nurse or doctor.          STOP taking these medications    amoxicillin-clavulanate 875-125 MG tablet Commonly known as: Augmentin Stopped by: Howard Pouch, DO       TAKE these medications    alendronate 70 MG tablet Commonly known as: FOSAMAX Take 1 tablet (70 mg total) by mouth every 7 (seven) days. Take with a full glass of water on an empty stomach.   amLODipine 10 MG tablet Commonly known as: NORVASC Take 1 tablet (10 mg total) by mouth daily.   atorvastatin 20 MG tablet Commonly known as: LIPITOR Take 1 tablet (20 mg total) by mouth daily.   benazepril 20 MG tablet Commonly known as: LOTENSIN Take 1.5 tablets (30 mg total) by mouth daily.   calcium carbonate 1500 (600 Ca) MG Tabs tablet Commonly known as: OSCAL Take 3 tablets by mouth daily.   Centrum Silver tablet Take 1 tablet by mouth daily.   cholecalciferol 1000 units tablet Commonly known as: VITAMIN D Take 1,000 Units by mouth daily.   escitalopram 20 MG  tablet Commonly known as: LEXAPRO Take 1 tablet (20 mg total) by mouth daily.   ezetimibe 10 MG tablet Commonly known as: ZETIA TAKE 1 TABLET(10 MG) BY  MOUTH DAILY   furosemide 20 MG tablet Commonly known as: LASIX TAKE 1 TABLET BY MOUTH EVERY MONDAY, WEDNESDAY AND FRIDAY   gabapentin 100 MG capsule Commonly known as: NEURONTIN 1 cap in morning and 2 caps at night   HYDROcodone-acetaminophen 5-325 MG tablet Commonly known as: NORCO/VICODIN Take 1 tablet by mouth 2 (two) times daily as needed for moderate pain.   levothyroxine 75 MCG tablet Commonly known as: SYNTHROID TAKE 1 TABLET(75 MCG) BY MOUTH DAILY   metoprolol tartrate 50 MG tablet Commonly known as: LOPRESSOR Take 0.5 tablets (25 mg total) by mouth 2 (two) times daily.   nitroGLYCERIN 0.4 MG SL tablet Commonly known as: NITROSTAT PLACE 1 TABLET UNDER THE TONGUE EVERY 5 MINUTES AS NEEDED FOR CHEST PAIN,(MAX 3 TABLETS)   PARoxetine 20 MG tablet Commonly known as: PAXIL Take 1 tablet (20 mg total) by mouth daily.   predniSONE 20 MG tablet Commonly known as: DELTASONE Take 2 tablets (40 mg total) by mouth daily with breakfast. Started by: Howard Pouch, DO   traZODone 100 MG tablet Commonly known as: DESYREL Take 1 tablet (100 mg total) by mouth at bedtime.   vitamin B-12 1000 MCG tablet Commonly known as: CYANOCOBALAMIN Take 1,000 mcg by mouth daily.   warfarin 5 MG tablet Commonly known as: COUMADIN Take as directed by the anticoagulation clinic. If you are unsure how to take this medication, talk to your nurse or doctor. Original instructions: TAKE AS DIRECTED BY COUMADIN CLINIC        All past medical history, surgical history, allergies, family history, immunizations andmedications were updated in the EMR today and reviewed under the history and medication portions of their EMR.     ROS: Negative, with the exception of above mentioned in HPI   Objective:  BP 98/62   Pulse 75   Temp 97.9 F  (36.6 C) (Oral)   Ht '5\' 4"'$  (1.626 m)   Wt 189 lb (85.7 kg)   SpO2 93%   BMI 32.44 kg/m  Body mass index is 32.44 kg/m. Gen: Afebrile. No acute distress. Nontoxic in appearance, well developed, well nourished.  Pleasant female. HENT: AT. [place].  Mild cough present.  No hoarseness.   Eyes:Pupils Equal Round Reactive to light, Extraocular movements intact,  Conjunctiva without redness, discharge or icterus. Neck/lymp/endocrine: Supple, no lymphadenopathy CV: RRR no murmur, no edema Chest: Rhonchi present bilateral lobes, no wheeze or crackles. Good air movement, normal resp effort.  Skin: No rashes, purpura or petechiae.  Neuro:  Normal gait. PERLA. EOMi. Alert. Oriented x3 Psych: Normal affect, dress and demeanor. Normal speech. Normal thought content and judgment.  No results found. No results found. No results found for this or any previous visit (from the past 24 hour(s)).  Assessment/Plan: Jennifer House is a 75 y.o. female present for OV for  Lobar pneumonia (HCC)/Pneumonia due to COVID-19 virus She states she feels much improved since completing the antibiotics.  She still has a nerve-racking cough.   -Encouraged her to hydrate. -Start Mucinex OTC to help decrease mucus production -Prednisone burst 40 mg x 5 days -Encouraged her to take 2 deep breaths at least twice an hour. - Basic Metabolic Panel (BMET) - DG Chest 2 View; Future Discussed repeat chest x-ray in about 5-6 weeks, this turns out to be around September 9-ordered at [facility].  AKI (acute kidney injury) ([place]) Her blood pressure is a little soft for her today.  She reports she is not dizzy or weak today.  Will repeat BMP and consider holding Lasix and lowering BenzePrO over the next 2-3 days depending upon lab results. I have asked her to work on her hydration the next couple days. - Basic Metabolic Panel (BMET)  Fall, initial encounter Suspect fall was from weakness due to COVID infection,  hypokalemia and continuing Lasix when not taking in adequate p.o. due to illness.  Hypokalemia BMP collected today.  Patient is on Lasix 3 days a week.     Reviewed expectations re: course of current medical issues. Discussed self-management of symptoms. Outlined signs and symptoms indicating need for more acute intervention. Patient verbalized understanding and all questions were answered. Patient received an After-Visit Summary.    Orders Placed This Encounter  Procedures   DG Chest 2 View   Basic Metabolic Panel (BMET)   Meds ordered this encounter  Medications   predniSONE (DELTASONE) 20 MG tablet    Sig: Take 2 tablets (40 mg total) by mouth daily with breakfast.    Dispense:  10 tablet    Refill:  0   Referral Orders  No referral(s) requested today     Note is dictated utilizing voice recognition software. Although note has been proof read prior to signing, occasional typographical errors still can be missed. If any questions arise, please do not hesitate to call for verification.   electronically signed by:  Howard Pouch, DO  [place]

## 2020-10-19 NOTE — Patient Instructions (Addendum)
Hydrate well.  Mucinex to decrease phlegm.  Make sure to take deep breaths,  a few an hour.   [facility] chest xray around Sept.9th. We will call with results.  [address] [place], [place] [address]  We will call you with lab results and further instructions.  0..  Community-Acquired Pneumonia, Adult Pneumonia is an infection of the lungs. It causes irritation and swelling in the airways of the lungs. Mucus and fluid may also build up inside the airways.This may cause coughing and trouble breathing. One type of pneumonia can happen while you are in a hospital. A different type can happen when you are not in a hospital (community-acquired pneumonia). What are the causes?  This condition is caused by germs (viruses, bacteria, or fungi). Some types of germs can spread from person to person. Pneumonia is notthought to spread from person to person. What increases the risk? You are more likely to develop this condition if: You have a long-term (chronic) disease, such as: Disease of the lungs. This may be chronic obstructive pulmonary disease (COPD) or asthma. Heart failure. Cystic fibrosis. Diabetes. Kidney disease. Sickle cell disease. HIV. You have other health problems, such as: Your body's defense system (immune system) is weak. A condition that may cause you to breathe in fluids from your mouth and nose. You had your spleen taken out. You do not take good care of your teeth and mouth (poor dental hygiene). You use or have used tobacco products. You travel where the germs that cause this illness are common. You are near certain animals or the places they live. You are older than 77 years of age. What are the signs or symptoms? Symptoms of this condition include: A cough. A fever. Sweating or chills. Chest pain, often when you breathe deeply or cough. Breathing problems, such as: Fast breathing. Trouble breathing. Shortness of breath. Feeling tired  (fatigued). Muscle aches. How is this treated? Treatment for this condition depends on many things, such as: The cause of your illness. Your medicines. Your other health problems. Most adults can be treated at home. Sometimes, treatment must happen in a hospital. Treatment may include medicines to kill germs. Medicines may depend on which germ caused your illness. Very bad pneumonia is rare. If you get it, you may: Have a machine to help you breathe. Have fluid taken away from around your lungs. Follow these instructions at home: Medicines Take over-the-counter and prescription medicines only as told by your doctor. Take cough medicine only if you are losing sleep. Cough medicine can keep your body from taking mucus away from your lungs. If you were prescribed an antibiotic medicine, take it as told by your doctor. Do not stop taking the antibiotic even if you start to feel better. Lifestyle     Do not drink alcohol. Do not use any products that contain nicotine or tobacco, such as cigarettes, e-cigarettes, and chewing tobacco. If you need help quitting, ask your doctor. Eat a healthy diet. This includes a lot of vegetables, fruits, whole grains, low-fat dairy products, and low-fat (lean) protein. General instructions  Rest a lot. Sleep for at least 8 hours each night. Sleep with your head and neck raised. Put a few pillows under your head or sleep in a reclining chair. Return to your normal activities as told by your doctor. Ask your doctor what activities are safe for you. Drink enough fluid to keep your pee (urine) pale yellow. If your throat is sore, rinse your mouth often with  salt water. To make salt water, dissolve -1 tsp (3-6 g) of salt in 1 cup (237 mL) of warm water. Keep all follow-up visits as told by your doctor. This is important.  How is this prevented? You can lower your risk of pneumonia by: Getting the pneumonia shot (vaccine). These shots have different types  and schedules. Ask your doctor what works best for you. Think about getting this shot if: You are older than 77 years of age. You are 34-53 years of age and: You are being treated for cancer. You have long-term lung disease. You have other problems that affect your body's defense system. Ask your doctor if you have one of these. Getting your flu shot every year. Ask your doctor which type of shot is best for you. Going to the dentist as often as told. Washing your hands often with soap and water for at least 20 seconds. If you cannot use soap and water, use hand sanitizer. Contact a doctor if: You have a fever. You lose sleep because your cough medicine does not help. Get help right away if: You are short of breath and this gets worse. You have more chest pain. Your sickness gets worse. This is very serious if: You are an older adult. Your body's defense system is weak. You cough up blood. These symptoms may be an emergency. Do not wait to see if the symptoms will go away. Get medical help right away. Call your local emergency services (911 in the [place] Do not drive yourself to the hospital. Summary Pneumonia is an infection of the lungs. Community-acquired pneumonia affects people who have not been in the hospital. Certain germs can cause this infection. This condition may be treated with medicines that kill germs. For very bad pneumonia, you may need a hospital stay and treatment to help with breathing. This information is not intended to replace advice given to you by your health care provider. Make sure you discuss any questions you have with your healthcare provider. Document Revised: 12/15/2018 Document Reviewed: 12/15/2018 Elsevier Patient Education  [place].

## 2020-10-20 ENCOUNTER — Other Ambulatory Visit: Payer: Self-pay | Admitting: Family Medicine

## 2020-10-20 MED ORDER — POTASSIUM CHLORIDE CRYS ER 20 MEQ PO TBCR: EXTENDED_RELEASE_TABLET | ORAL | 5 refills | Status: DC

## 2020-11-17 ENCOUNTER — Ambulatory Visit: Payer: Medicare Other | Admitting: *Deleted

## 2020-11-17 ENCOUNTER — Other Ambulatory Visit: Payer: Self-pay

## 2020-11-17 DIAGNOSIS — Z7901 Long term (current) use of anticoagulants: Secondary | ICD-10-CM

## 2020-11-17 LAB — POCT INR: INR: 3.3 — AB (ref 2.0–3.0)

## 2020-11-17 NOTE — Patient Instructions (Signed)
Description   Hold today, then Continue taking Warfarin 1 tablet warfarin every day. Recheck INR in 4 weeks.  [phone] Coumadin clinic. Main # [phone].

## 2020-12-06 ENCOUNTER — Other Ambulatory Visit: Payer: Self-pay | Admitting: Family Medicine

## 2020-12-13 NOTE — Progress Notes (Signed)
Cardiology Office Note Date:  12/18/2020  Patient ID:  Jennifer House, Schwalbe 09-29-1945, MRN [id number] PCP:  Ma Hillock, DO  Electrophysiologist: Dr. Rayann Heman    Chief Complaint:  over due EP visit  History of Present Illness: Jennifer House is a 75 y.o. female with history of HTN, HLD, hypothyroidism, obesity, and permanent Afib  She comes today to be seen for Dr. Rayann Heman, last seen by him via tele health 08/31/2018, at that time doing OK, no changes were made, planned for annual visit  She had an ER visit 10/12/20 after a fall, seems without significant injuray and discharged from the ER.  There was suspect degree of dementia with poor short term memory, though son denied this as a known dx.  Was found with a pneumonia, EKg was rate controlled AF Saw her PMD 10/19/20 with ongoing management of her pneumonia I do not see a specific discussion regarding dementia or not   She comes today accompanied by her daughter. She is doing well, recovered from her UTI/pneumonia.  No trouble with falls, they report she is steady on her feet.  No CP, palpitations or cardiac awareness. No dizzy spells, near syncope or syncope. 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 Risa Grill   Closed displaced fracture of left femoral neck ([place]) 08/28/2017   Colon polyps    Depression    GERD (gastroesophageal reflux disease)    Headache(784.0)    hx of migraines   Hematuria    hx   HLD (hyperlipidemia)    mixed   HTN (hypertension)    Hypothyroidism    Insomnia    Liver function test abnormality    hx fatty liver   Medicare annual wellness visit, subsequent 08/25/2015   Murmur, cardiac    Nonalcoholic fatty liver disease    Obesity    Osteoarthritis     low back pain with left sciatica   Osteopenia    dexa 2013   Renal calculus    hx   RENAL CALCULUS, HX OF 01/31/2009   Qualifier: Diagnosis of  By: Selena Batten CMA, [place]     Restless leg syndrome     Past Surgical History:  Procedure Laterality Date   APPENDECTOMY     CARDIAC CATHETERIZATION  1/12   revealed normal cors and LV function   COLONOSCOPY     cystocopy  10/17/03   left stent placement, TURBT   FOOT SURGERY     LUMBAR LAMINECTOMY/DECOMPRESSION MICRODISCECTOMY N/A 01/19/2013   Procedure: LUMBAR THREE FOUR LUMBAR LAMINECTOMY/DECOMPRESSION MICRODISCECTOMY 1 LEVEL;  Surgeon: Faythe Ghee, MD;  Location: [facility];  Service: Neurosurgery;  Laterality: N/A;   TOTAL ABDOMINAL HYSTERECTOMY  1985   TOTAL HIP ARTHROPLASTY Left 08/28/2017   Procedure: TOTAL HIP ARTHROPLASTY ANTERIOR APPROACH;  Surgeon: Rod Can, MD;  Location: [place];  Service: Orthopedics;  Laterality: Left;    Current Outpatient Medications  Medication Sig Dispense Refill   amLODipine (NORVASC) 10 MG tablet Take 1 tablet (10 mg total) by mouth daily. 90 tablet 1   atorvastatin (LIPITOR) 20 MG tablet Take 1 tablet (20 mg total) by mouth daily. 90 tablet 3   benazepril (LOTENSIN) 20 MG tablet Take 1.5 tablets (30  mg total) by mouth daily. 135 tablet 1   calcium carbonate (OSCAL) 1500 (600 Ca) MG TABS tablet Take 3 tablets by mouth daily.     cholecalciferol (VITAMIN D) 1000 UNITS tablet Take 1,000 Units by mouth daily.     escitalopram (LEXAPRO) 20 MG tablet Take 1 tablet (20 mg total) by mouth daily. 90 tablet 1   ezetimibe (ZETIA) 10 MG tablet TAKE 1 TABLET(10 MG) BY MOUTH DAILY 90 tablet 3   furosemide (LASIX) 20 MG tablet TAKE 1 TABLET BY MOUTH EVERY MONDAY, WEDNESDAY AND FRIDAY 45 tablet 3   gabapentin (NEURONTIN) 100 MG capsule 1 cap in morning and 2 caps at night 270 capsule 5   HYDROcodone-acetaminophen (NORCO/VICODIN) 5-325 MG tablet Take 1 tablet by mouth 2 (two) times daily as needed for moderate  pain. 180 tablet 0   levothyroxine (SYNTHROID) 75 MCG tablet TAKE 1 TABLET(75 MCG) BY MOUTH DAILY 90 tablet 3   metoprolol tartrate (LOPRESSOR) 50 MG tablet Take 0.5 tablets (25 mg total) by mouth 2 (two) times daily. 180 tablet 1   Multiple Vitamins-Minerals (CENTRUM SILVER) tablet Take 1 tablet by mouth daily.     nitroGLYCERIN (NITROSTAT) 0.4 MG SL tablet PLACE 1 TABLET UNDER THE TONGUE EVERY 5 MINUTES AS NEEDED FOR CHEST PAIN,(MAX 3 TABLETS) 25 tablet 0   PARoxetine (PAXIL) 20 MG tablet Take 1 tablet (20 mg total) by mouth daily. 90 tablet 1   potassium chloride SA (KLOR-CON) 20 MEQ tablet 1 tab p.o. twice daily for 2 days.  Then take 1 tab p.o. on days taking Lasix 30 tablet 5   traZODone (DESYREL) 100 MG tablet Take 1 tablet (100 mg total) by mouth at bedtime. 90 tablet 1   vitamin B-12 (CYANOCOBALAMIN) 1000 MCG tablet Take 1,000 mcg by mouth daily.     warfarin (COUMADIN) 5 MG tablet TAKE AS DIRECTED BY COUMADIN CLINIC 35 tablet 3   No current facility-administered medications for this visit.    Allergies:   Ibandronate sodium, Pravachol [pravastatin sodium], Nsaids, and Ultram [tramadol hcl]   Social History:  The patient  reports that she quit smoking about 35 years ago. Her smoking use included cigarettes. She has never used smokeless tobacco. She reports that she does not drink alcohol and does not use drugs.   Family History:  The patient's family history includes Heart attack in her mother; Heart failure in her mother; Hypertension in her father and mother; Stroke in her brother and father; Thyroid cancer in her mother.  ROS:  Please see the history of present illness.    All other systems are reviewed and otherwise negative.   PHYSICAL EXAM:  VS:  BP 122/74   Pulse 60   Ht 5\' 4"  (1.626 m)   Wt 193 lb (87.5 kg)   SpO2 95%   BMI 33.13 kg/m  BMI: Body mass index is 33.13 kg/m. Well nourished, well developed, in no acute distress HEENT: normocephalic, atraumatic Neck: no  JVD, carotid bruits or masses Cardiac:  irreg-irreg; no significant murmurs, no rubs, or gallops Lungs:  CTA b/l, no wheezing, rhonchi or rales Abd: soft, nontender MS: no deformity or atrophy Ext: no edema Skin: warm and dry, no rash Neuro:  No gross deficits appreciated Psych: euthymic mood, full affect    EKG:  not done today 09/22/20: AFib 71bpm   08/26/2017: TTE Study Conclusions  - Left ventricle: The cavity size was normal. Wall thickness was    normal. Systolic function was normal. The estimated ejection  fraction was in the range of 60% to 65%.  - Aortic valve: There was trivial regurgitation.  - Mitral valve: Calcified annulus.  - Left atrium: The atrium was moderately to severely dilated.  - Right atrium: The atrium was moderately to severely dilated.  - Pulmonary arteries: PA peak pressure: 32 mm Hg (S).  - Pericardium, extracardiac: A trivial pericardial effusion was    identified.     Recent Labs: 10/12/2020: ALT 38; Hemoglobin 12.6; Magnesium 1.8; Platelets 120; TSH 0.654 10/19/2020: BUN 12; Creatinine, Ser 0.88; Potassium 3.3; Sodium 141  09/15/2020: Cholesterol 126; HDL 49.30; LDL Cholesterol 56; Total CHOL/HDL Ratio 3; Triglycerides 107.0; VLDL 21.4   CrCl cannot be calculated (Patient's most recent lab result is older than the maximum 21 days allowed.).   Wt Readings from Last 3 Encounters:  12/18/20 193 lb (87.5 kg)  10/19/20 189 lb (85.7 kg)  10/12/20 193 lb 9.6 oz (87.8 kg)     Other studies reviewed: Additional studies/records reviewed today include: summarized above  ASSESSMENT AND PLAN:  Permanent Afib CHA2DS2Vasc is 4, on warfarin, monitored and managed by our coumadin clinic  rate controlled No cardiac awareness  No symptoms to suggest any clinical change Will think about updating her echo next year, sooner if needed  HTN Looks good  HLD Lipids are monitored and managed with her PMD  Disposition: F/u with coumadin clinic as usual,  RTC otherwise in a year, sooner if needed  Current medicines are reviewed at length with the patient today.  The patient did not have any concerns regarding medicines.  Venetia Night, PA-C 12/18/2020 9:18 AM     [facility] [address] [place]  [address] [phone] (office)  [phone] (fax)

## 2020-12-18 ENCOUNTER — Encounter: Payer: Self-pay | Admitting: Physician Assistant

## 2020-12-18 ENCOUNTER — Ambulatory Visit (INDEPENDENT_AMBULATORY_CARE_PROVIDER_SITE_OTHER): Payer: Medicare Other | Admitting: *Deleted

## 2020-12-18 ENCOUNTER — Other Ambulatory Visit: Payer: Self-pay

## 2020-12-18 ENCOUNTER — Ambulatory Visit: Payer: Medicare Other | Admitting: Physician Assistant

## 2020-12-18 VITALS — BP 122/74 | HR 60 | Ht 64.0 in | Wt 193.0 lb

## 2020-12-18 DIAGNOSIS — Z7901 Long term (current) use of anticoagulants: Secondary | ICD-10-CM

## 2020-12-18 DIAGNOSIS — I10 Essential (primary) hypertension: Secondary | ICD-10-CM

## 2020-12-18 DIAGNOSIS — I4821 Permanent atrial fibrillation: Secondary | ICD-10-CM | POA: Diagnosis not present

## 2020-12-18 DIAGNOSIS — Z5181 Encounter for therapeutic drug level monitoring: Secondary | ICD-10-CM

## 2020-12-18 DIAGNOSIS — E785 Hyperlipidemia, unspecified: Secondary | ICD-10-CM

## 2020-12-18 LAB — POCT INR: INR: 2.9 (ref 2.0–3.0)

## 2020-12-18 NOTE — Patient Instructions (Signed)
Medication Instructions:   Your physician recommends that you continue on your current medications as directed. Please refer to the Current Medication list given to you today.   *If you need a refill on your cardiac medications before your next appointment, please call your pharmacy*   Lab Work:  NONE ORDERED  TODAY   If you have labs (blood work) drawn today and your tests are completely normal, you will receive your results only by: MyChart Message (if you have MyChart) OR A paper copy in the mail If you have any lab test that is abnormal or we need to change your treatment, we will call you to review the results.   Testing/Procedures: NONE ORDERED  TODAY     Follow-Up: At [facility], you and your health needs are our priority.  As part of our continuing mission to provide you with exceptional heart care, we have created designated Provider Care Teams.  These Care Teams include your primary Cardiologist (physician) and Advanced Practice Providers (APPs -  Physician Assistants and Nurse Practitioners) who all work together to provide you with the care you need, when you need it.  We recommend signing up for the patient portal called "MyChart".  Sign up information is provided on this After Visit Summary.  MyChart is used to connect with patients for Virtual Visits (Telemedicine).  Patients are able to view lab/test results, encounter notes, upcoming appointments, etc.  Non-urgent messages can be sent to your provider as well.   To learn more about what you can do with MyChart, go to [email or web address].    Your next appointment:   1 year(s)  The format for your next appointment:   In Person  Provider:   You will see one of the following Advanced Practice Providers on your designated Care Team:   Renee Ursuy, PA-C       Other Instructions  

## 2020-12-18 NOTE — Patient Instructions (Signed)
Description    Continue taking Warfarin 1 tablet warfarin every day. Recheck INR in 5 weeks.  [phone] Coumadin clinic. Main # [phone].

## 2021-01-03 ENCOUNTER — Ambulatory Visit: Payer: Medicare Other

## 2021-01-03 ENCOUNTER — Ambulatory Visit (INDEPENDENT_AMBULATORY_CARE_PROVIDER_SITE_OTHER): Payer: Medicare Other | Admitting: *Deleted

## 2021-01-03 DIAGNOSIS — Z0000 Encounter for general adult medical examination without abnormal findings: Secondary | ICD-10-CM | POA: Diagnosis not present

## 2021-01-03 NOTE — Progress Notes (Signed)
Subjective:   Jennifer House is a 75 y.o. female who presents for Medicare Annual (Subsequent) preventive examination.  I connected with  Jennifer House on 01/03/21 by a telephone enabled telemedicine application and verified that I am speaking with the correct person using two identifiers.   I discussed the limitations of evaluation and management by telemedicine. The patient expressed understanding and agreed to proceed.   Review of Systems     Cardiac Risk Factors include: advanced age (>59men, >52 women);hypertension     Objective:    Today's Vitals   There is no height or weight on file to calculate BMI.  Advanced Directives 10/12/2020 12/29/2019 08/28/2017 09/19/2016 01/19/2013 01/18/2013  Does Patient Have a Medical Advance Directive? No No No No Patient does not have advance directive Patient does not have advance directive;Patient would like information  Would patient like information on creating a medical advance directive? - Yes (MAU/Ambulatory/Procedural Areas - Information given) No - Patient declined Yes (MAU/Ambulatory/Procedural Areas - Information given) Advance directive packet given Advance directive packet given  Pre-existing out of facility DNR order (yellow form or pink MOST form) - - - - No -    Current Medications (verified) Outpatient Encounter Medications as of 01/03/2021  Medication Sig   amLODipine (NORVASC) 10 MG tablet Take 1 tablet (10 mg total) by mouth daily.   atorvastatin (LIPITOR) 20 MG tablet Take 1 tablet (20 mg total) by mouth daily.   benazepril (LOTENSIN) 20 MG tablet Take 1.5 tablets (30 mg total) by mouth daily.   calcium carbonate (OSCAL) 1500 (600 Ca) MG TABS tablet Take 3 tablets by mouth daily.   cholecalciferol (VITAMIN D) 1000 UNITS tablet Take 1,000 Units by mouth daily.   escitalopram (LEXAPRO) 20 MG tablet Take 1 tablet (20 mg total) by mouth daily.   ezetimibe (ZETIA) 10 MG tablet TAKE 1 TABLET(10 MG) BY MOUTH DAILY    furosemide (LASIX) 20 MG tablet TAKE 1 TABLET BY MOUTH EVERY MONDAY, WEDNESDAY AND FRIDAY   gabapentin (NEURONTIN) 100 MG capsule 1 cap in morning and 2 caps at night   HYDROcodone-acetaminophen (NORCO/VICODIN) 5-325 MG tablet Take 1 tablet by mouth 2 (two) times daily as needed for moderate pain.   levothyroxine (SYNTHROID) 75 MCG tablet TAKE 1 TABLET(75 MCG) BY MOUTH DAILY   metoprolol tartrate (LOPRESSOR) 50 MG tablet Take 0.5 tablets (25 mg total) by mouth 2 (two) times daily.   Multiple Vitamins-Minerals (CENTRUM SILVER) tablet Take 1 tablet by mouth daily.   nitroGLYCERIN (NITROSTAT) 0.4 MG SL tablet PLACE 1 TABLET UNDER THE TONGUE EVERY 5 MINUTES AS NEEDED FOR CHEST PAIN,(MAX 3 TABLETS)   PARoxetine (PAXIL) 20 MG tablet Take 1 tablet (20 mg total) by mouth daily.   potassium chloride SA (KLOR-CON) 20 MEQ tablet 1 tab p.o. twice daily for 2 days.  Then take 1 tab p.o. on days taking Lasix   traZODone (DESYREL) 100 MG tablet Take 1 tablet (100 mg total) by mouth at bedtime.   vitamin B-12 (CYANOCOBALAMIN) 1000 MCG tablet Take 1,000 mcg by mouth daily.   warfarin (COUMADIN) 5 MG tablet TAKE AS DIRECTED BY COUMADIN CLINIC   No facility-administered encounter medications on file as of 01/03/2021.    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  Grapey   Closed displaced fracture of left femoral neck ([place]) 08/28/2017   Colon polyps    Depression    GERD (gastroesophageal reflux disease)    Headache(784.0)    hx of migraines   Hematuria    hx   HLD (hyperlipidemia)    mixed   HTN (hypertension)    Hypothyroidism    Insomnia    Liver function test abnormality    hx fatty liver   Medicare annual wellness visit, subsequent 08/25/2015   Murmur,  cardiac    Nonalcoholic fatty liver disease    Obesity    Osteoarthritis    low back pain with left sciatica   Osteopenia    dexa 2013   Renal calculus    hx   RENAL CALCULUS, HX OF 01/31/2009   Qualifier: Diagnosis of  By: Selena Batten CMA, [place]     Restless leg syndrome    Past Surgical History:  Procedure Laterality Date   APPENDECTOMY     CARDIAC CATHETERIZATION  1/12   revealed normal cors and LV function   COLONOSCOPY     cystocopy  10/17/03   left stent placement, TURBT   FOOT SURGERY     LUMBAR LAMINECTOMY/DECOMPRESSION MICRODISCECTOMY N/A 01/19/2013   Procedure: LUMBAR THREE FOUR LUMBAR LAMINECTOMY/DECOMPRESSION MICRODISCECTOMY 1 LEVEL;  Surgeon: Jennifer Ghee, MD;  Location: [facility];  Service: Neurosurgery;  Laterality: N/A;   TOTAL ABDOMINAL HYSTERECTOMY  1985   TOTAL HIP ARTHROPLASTY Left 08/28/2017   Procedure: TOTAL HIP ARTHROPLASTY ANTERIOR APPROACH;  Surgeon: Jennifer Can, MD;  Location: [facility];  Service: Orthopedics;  Laterality: Left;   Family History  Problem Relation Age of Onset   Heart failure Mother    Heart attack Mother    Hypertension Mother    Thyroid cancer Mother    Stroke Father    Hypertension Father    Stroke Brother    Breast cancer Neg Hx    Colon cancer Neg Hx    Social History   Socioeconomic History   Marital status: Married    Spouse name: Not on file   Number of children: Not on file   Years of education: Not on file   Highest education level: Not on file  Occupational History   Not on file  Tobacco Use   Smoking status: Former    Types: Cigarettes    Quit date: 03/18/1985    Years since quitting: 35.8   Smokeless tobacco: Never   Tobacco comments:    denies   Vaping Use   Vaping Use: Never used  Substance and Sexual Activity   Alcohol use: No   Drug use: No   Sexual activity: Never  Other Topics Concern   Not on file  Social History Narrative   Lives in [place], with husband [place].   Has 4 adult children ,  highest level of education is seventh grade.   Former smoker, denies recreational drugs or alcohol use.   Patient drink caffeine beverages, takes a daily vitamin   Patient was her seatbelt , she has dentures , she has a smoke detector in the home , there are guns in the home a  Locked case    Patient feels safe in her relationships       Social Determinants of Health   Financial Resource Strain: Low Risk    Difficulty of Paying Living Expenses: Not hard at all  Food Insecurity: No Food Insecurity   Worried About Charity fundraiser in  the Last Year: Never true   Ran Out of Food in the Last Year: Never true  Transportation Needs: No Transportation Needs   Lack of Transportation (Medical): No   Lack of Transportation (Non-Medical): No  Physical Activity: Insufficiently Active   Days of Exercise per Week: 4 days   Minutes of Exercise per Session: 30 min  Stress: No Stress Concern Present   Feeling of Stress : Not at all  Social Connections: Socially Isolated   Frequency of Communication with Friends and Family: Three times a week   Frequency of Social Gatherings with Friends and Family: More than three times a week   Attends Religious Services: Never   Marine scientist or Organizations: No   Attends Archivist Meetings: Never   Marital Status: Widowed    Tobacco Counseling Counseling given: Not Answered Tobacco comments: denies    Clinical Intake:  Pre-visit preparation completed: Yes  Pain : No/denies pain     Nutritional Risks: None Diabetes: No  How often do you need to have someone help you when you read instructions, pamphlets, or other written materials from your doctor or pharmacy?: 1 - Never  Diabetic?no  Interpreter Needed?: No  Information entered by :: Leroy Kennedy LPN   Activities of Daily Living In your present state of health, do you have any difficulty performing the following activities: 01/03/2021  Hearing? N  Vision? N   Difficulty concentrating or making decisions? N  Walking or climbing stairs? N  Dressing or bathing? N  Doing errands, shopping? N  Preparing Food and eating ? N  Using the Toilet? N  In the past six months, have you accidently leaked urine? N  Do you have problems with loss of bowel control? N  Managing your Medications? N  Managing your Finances? N  Housekeeping or managing your Housekeeping? N  Some recent data might be hidden    Patient Care Team: Ma Hillock, DO as PCP - General (Family Medicine) Thompson Grayer, MD as Attending Physician (Cardiology) Ronald Lobo, MD as Consulting Physician (Gastroenterology) Rana Snare, MD (Inactive) as Consulting Physician (Urology) Jodi Mourning, Amy V, OD (Optometry)  Indicate any recent Medical Services you may have received from other than [facility] providers in the past year (date may be approximate).     Assessment:   This is a routine wellness examination for [place].  Hearing/Vision screen Hearing Screening - Comments:: No trouble hearing Vision Screening - Comments:: [facility] Not up to date  Dietary issues and exercise activities discussed: Current Exercise Habits: Home exercise routine, Time (Minutes): 20, Frequency (Times/Week): 4, Weekly Exercise (Minutes/Week): 80, Intensity: Mild   Goals Addressed             This Visit's Progress    Patient Stated       Maintain current health       Depression Screen PHQ 2/9 Scores 01/03/2021 09/15/2020 05/10/2020 01/13/2020 12/29/2019 12/31/2018 06/02/2018  PHQ - 2 Score 0 2 0 1 0 0 0  PHQ- 9 Score - 3 - 5 - - 0    Fall Risk Fall Risk  01/03/2021 12/29/2019 06/02/2018 01/12/2018 09/09/2017  Falls in the past year? 0 0 0 No Yes  Number falls in past yr: 0 0 - - -  Injury with Fall? 0 0 - - Yes  Risk Factor Category  - - - - High Fall Risk  Risk for fall due to : - - Medication side effect - Impaired balance/gait  Follow  up Falls evaluation completed;Falls prevention  discussed Falls prevention discussed Education provided - -    FALL RISK PREVENTION PERTAINING TO THE HOME:  Any stairs in or around the home? No  If so, are there any without handrails? No  Home free of loose throw rugs in walkways, pet beds, electrical cords, etc? Yes  Adequate lighting in your home to reduce risk of falls? Yes   ASSISTIVE DEVICES UTILIZED TO PREVENT FALLS:  Life alert? No  Use of a cane, walker or w/c? No  Grab bars in the bathroom? Yes  Shower chair or bench in shower? Yes  Elevated toilet seat or a handicapped toilet? No   TIMED UP AND GO:  Was the test performed? No .    Cognitive Function:  Normal cognitive status assessed by direct observation by this Nurse Health Advisor. No abnormalities found.   MMSE - Mini Mental State Exam 09/19/2016  Orientation to time 5  Orientation to Place 5  Registration 3  Attention/ Calculation 2  Recall 2  Language- name 2 objects 2  Language- repeat 1  Language- follow 3 step command 3  Language- read & follow direction 1  Write a sentence 0  Copy design 1  Total score 25        Immunizations Immunization History  Administered Date(s) Administered   Fluad Quad(high Dose 65+) 11/30/2018, 01/13/2020   Influenza, High Dose Seasonal PF 01/02/2017, 01/12/2018   Influenza,inj,Quad PF,6+ Mos 01/23/2015, 12/15/2015   Pneumococcal Conjugate-13 08/18/2014   Pneumococcal Polysaccharide-23 02/27/2009, 08/25/2015   Tdap 02/22/2015    TDAP status: Up to date  Flu Vaccine status: Due, Education has been provided regarding the importance of this vaccine. Advised may receive this vaccine at local pharmacy or Health Dept. Aware to provide a copy of the vaccination record if obtained from local pharmacy or Health Dept. Verbalized acceptance and understanding.  Pneumococcal vaccine status: Up to date  Covid-19 vaccine status: Declined, Education has been provided regarding the importance of this vaccine but patient still  declined. Advised may receive this vaccine at local pharmacy or Health Dept.or vaccine clinic. Aware to provide a copy of the vaccination record if obtained from local pharmacy or Health Dept. Verbalized acceptance and understanding.  Qualifies for Shingles Vaccine? Yes   Zostavax completed No   Shingrix Completed?: No.    Education has been provided regarding the importance of this vaccine. Patient has been advised to call insurance company to determine out of pocket expense if they have not yet received this vaccine. Advised may also receive vaccine at local pharmacy or Health Dept. Verbalized acceptance and understanding.  Screening Tests Health Maintenance  Topic Date Due   Zoster Vaccines- Shingrix (1 of 2) Never done   INFLUENZA VACCINE  10/16/2020   MAMMOGRAM  01/18/2021   TETANUS/TDAP  02/21/2025   Pneumonia Vaccine 17+ Years old  Completed   DEXA SCAN  Completed   Hepatitis C Screening  Completed   HPV VACCINES  Aged Out   COVID-19 Vaccine  Discontinued    Health Maintenance  Health Maintenance Due  Topic Date Due   Zoster Vaccines- Shingrix (1 of 2) Never done   INFLUENZA VACCINE  10/16/2020    Colorectal cancer screening: No longer required.     Bone Density status: Ordered  . Pt provided with contact info and advised to call to schedule appt.  Lung Cancer Screening: (Low Dose CT Chest recommended if Age 65-80 years, 30 pack-year currently smoking OR have quit w/in  15years.) qualify.   Lung Cancer Screening Referral:   Additional Screening:   Hepatitis C Screening: does not qualify; Completed   Vision Screening: Recommended annual ophthalmology exams for early detection of glaucoma and other disorders of the eye. Is the patient up to date with their annual eye exam?  No  Who is the provider or what is the name of the office in which the patient attends annual eye exams? [facility] If pt is not established with a provider, would they like to be referred to  a provider to establish care? No .   Dental Screening: Recommended annual dental exams for proper oral hygiene  Community Resource Referral / Chronic Care Management: CRR required this visit?  No   CCM required this visit?  No      Plan:     I have personally reviewed and noted the following in the patient's chart:   Medical and social history Use of alcohol, tobacco or illicit drugs  Current medications and supplements including opioid prescriptions.  Functional ability and status Nutritional status Physical activity Advanced directives List of other physicians Hospitalizations, surgeries, and ER visits in previous 12 months Vitals Screenings to include cognitive, depression, and falls Referrals and appointments  In addition, I have reviewed and discussed with patient certain preventive protocols, quality metrics, and best practice recommendations. A written personalized care plan for preventive services as well as general preventive health recommendations were provided to patient.     Leroy Kennedy, LPN   [id number]   Nurse Notes:

## 2021-01-03 NOTE — Patient Instructions (Signed)
Ms. Jennifer House , Thank you for taking time to come for your Medicare Wellness Visit. I appreciate your ongoing commitment to your health goals. Please review the following plan we discussed and let me know if I can assist you in the future.   Screening recommendations/referrals: Colonoscopy: no longer indicated Mammogram: Education provided Bone Density: Education provided Recommended yearly ophthalmology/optometry visit for glaucoma screening and checkup Recommended yearly dental visit for hygiene and checkup  Vaccinations: Influenza vaccine: Education provided Pneumococcal vaccine: up to date Tdap vaccine: up to date Shingles vaccine: Education provided    Advanced directives: copy requested  Conditions/risks identified:      Preventive Care 20 Years and Older, Female Preventive care refers to lifestyle choices and visits with your health care provider that can promote health and wellness. What does preventive care include? A yearly physical exam. This is also called an annual well check. Dental exams once or twice a year. Routine eye exams. Ask your health care provider how often you should have your eyes checked. Personal lifestyle choices, including: Daily care of your teeth and gums. Regular physical activity. Eating a healthy diet. Avoiding tobacco and drug use. Limiting alcohol use. Practicing safe sex. Taking low-dose aspirin every day. Taking vitamin and mineral supplements as recommended by your health care provider. What happens during an annual well check? The services and screenings done by your health care provider during your annual well check will depend on your age, overall health, lifestyle risk factors, and family history of disease. Counseling  Your health care provider may ask you questions about your: Alcohol use. Tobacco use. Drug use. Emotional well-being. Home and relationship well-being. Sexual activity. Eating habits. History of falls. Memory  and ability to understand (cognition). Work and work Statistician. Reproductive health. Screening  You may have the following tests or measurements: Height, weight, and BMI. Blood pressure. Lipid and cholesterol levels. These may be checked every 5 years, or more frequently if you are over 71 years old. Skin check. Lung cancer screening. You may have this screening every year starting at age 26 if you have a 30-pack-year history of smoking and currently smoke or have quit within the past 15 years. Fecal occult blood test (FOBT) of the stool. You may have this test every year starting at age 13. Flexible sigmoidoscopy or colonoscopy. You may have a sigmoidoscopy every 5 years or a colonoscopy every 10 years starting at age 44. Hepatitis C blood test. Hepatitis B blood test. Sexually transmitted disease (STD) testing. Diabetes screening. This is done by checking your blood sugar (glucose) after you have not eaten for a while (fasting). You may have this done every 1-3 years. Bone density scan. This is done to screen for osteoporosis. You may have this done starting at age 36. Mammogram. This may be done every 1-2 years. Talk to your health care provider about how often you should have regular mammograms. Talk with your health care provider about your test results, treatment options, and if necessary, the need for more tests. Vaccines  Your health care provider may recommend certain vaccines, such as: Influenza vaccine. This is recommended every year. Tetanus, diphtheria, and acellular pertussis (Tdap, Td) vaccine. You may need a Td booster every 10 years. Zoster vaccine. You may need this after age 45. Pneumococcal 13-valent conjugate (PCV13) vaccine. One dose is recommended after age 3. Pneumococcal polysaccharide (PPSV23) vaccine. One dose is recommended after age 2. Talk to your health care provider about which screenings and vaccines you need and  how often you need them. This  information is not intended to replace advice given to you by your health care provider. Make sure you discuss any questions you have with your health care provider. Document Released: 03/31/2015 Document Revised: 11/22/2015 Document Reviewed: 01/03/2015 Elsevier Interactive Patient Education  2017 [employer] Prevention in the Home Falls can cause injuries. They can happen to people of all ages. There are many things you can do to make your home safe and to help prevent falls. What can I do on the outside of my home? Regularly fix the edges of walkways and driveways and fix any cracks. Remove anything that might make you trip as you walk through a door, such as a raised step or threshold. Trim any bushes or trees on the path to your home. Use bright outdoor lighting. Clear any walking paths of anything that might make someone trip, such as rocks or tools. Regularly check to see if handrails are loose or broken. Make sure that both sides of any steps have handrails. Any raised decks and porches should have guardrails on the edges. Have any leaves, snow, or ice cleared regularly. Use sand or salt on walking paths during winter. Clean up any spills in your garage right away. This includes oil or grease spills. What can I do in the bathroom? Use night lights. Install grab bars by the toilet and in the tub and shower. Do not use towel bars as grab bars. Use non-skid mats or decals in the tub or shower. If you need to sit down in the shower, use a plastic, non-slip stool. Keep the floor dry. Clean up any water that spills on the floor as soon as it happens. Remove soap buildup in the tub or shower regularly. Attach bath mats securely with double-sided non-slip rug tape. Do not have throw rugs and other things on the floor that can make you trip. What can I do in the bedroom? Use night lights. Make sure that you have a light by your bed that is easy to reach. Do not use any sheets or  blankets that are too big for your bed. They should not hang down onto the floor. Have a firm chair that has side arms. You can use this for support while you get dressed. Do not have throw rugs and other things on the floor that can make you trip. What can I do in the kitchen? Clean up any spills right away. Avoid walking on wet floors. Keep items that you use a lot in easy-to-reach places. If you need to reach something above you, use a strong step stool that has a grab bar. Keep electrical cords out of the way. Do not use floor polish or wax that makes floors slippery. If you must use wax, use non-skid floor wax. Do not have throw rugs and other things on the floor that can make you trip. What can I do with my stairs? Do not leave any items on the stairs. Make sure that there are handrails on both sides of the stairs and use them. Fix handrails that are broken or loose. Make sure that handrails are as long as the stairways. Check any carpeting to make sure that it is firmly attached to the stairs. Fix any carpet that is loose or worn. Avoid having throw rugs at the top or bottom of the stairs. If you do have throw rugs, attach them to the floor with carpet tape. Make sure that you have  a light switch at the top of the stairs and the bottom of the stairs. If you do not have them, ask someone to add them for you. What else can I do to help prevent falls? Wear shoes that: Do not have high heels. Have rubber bottoms. Are comfortable and fit you well. Are closed at the toe. Do not wear sandals. If you use a stepladder: Make sure that it is fully opened. Do not climb a closed stepladder. Make sure that both sides of the stepladder are locked into place. Ask someone to hold it for you, if possible. Clearly mark and make sure that you can see: Any grab bars or handrails. First and last steps. Where the edge of each step is. Use tools that help you move around (mobility aids) if they are  needed. These include: Canes. Walkers. Scooters. Crutches. Turn on the lights when you go into a dark area. Replace any light bulbs as soon as they burn out. Set up your furniture so you have a clear path. Avoid moving your furniture around. If any of your floors are uneven, fix them. If there are any pets around you, be aware of where they are. Review your medicines with your doctor. Some medicines can make you feel dizzy. This can increase your chance of falling. Ask your doctor what other things that you can do to help prevent falls. This information is not intended to replace advice given to you by your health care provider. Make sure you discuss any questions you have with your health care provider. Document Released: 12/29/2008 Document Revised: 08/10/2015 Document Reviewed: 04/08/2014 Elsevier Interactive Patient Education  2017 [employer].

## 2021-01-10 ENCOUNTER — Other Ambulatory Visit: Payer: Self-pay | Admitting: Internal Medicine

## 2021-01-25 ENCOUNTER — Ambulatory Visit: Payer: Medicare Other | Admitting: *Deleted

## 2021-01-25 ENCOUNTER — Other Ambulatory Visit: Payer: Self-pay

## 2021-01-25 DIAGNOSIS — Z7901 Long term (current) use of anticoagulants: Secondary | ICD-10-CM | POA: Diagnosis not present

## 2021-01-25 DIAGNOSIS — I4891 Unspecified atrial fibrillation: Secondary | ICD-10-CM

## 2021-01-25 LAB — POCT INR: INR: 3.1 — AB (ref 2.0–3.0)

## 2021-01-25 NOTE — Patient Instructions (Signed)
Description   Today take 1/2 tablet then continue taking Warfarin 1 tablet warfarin everyday. Recheck INR in 4 weeks.  [phone] Coumadin clinic. Main # [phone].

## 2021-02-22 ENCOUNTER — Other Ambulatory Visit: Payer: Self-pay

## 2021-02-22 ENCOUNTER — Ambulatory Visit: Payer: Medicare Other

## 2021-02-22 DIAGNOSIS — Z7901 Long term (current) use of anticoagulants: Secondary | ICD-10-CM | POA: Diagnosis not present

## 2021-02-22 DIAGNOSIS — I4891 Unspecified atrial fibrillation: Secondary | ICD-10-CM

## 2021-02-22 DIAGNOSIS — Z5181 Encounter for therapeutic drug level monitoring: Secondary | ICD-10-CM | POA: Diagnosis not present

## 2021-02-22 LAB — POCT INR: INR: 2.3 (ref 2.0–3.0)

## 2021-02-22 NOTE — Patient Instructions (Signed)
Description   Continue taking Warfarin 1 tablet warfarin everyday. Recheck INR in 6 weeks.  [phone] Coumadin clinic. Main # [phone].

## 2021-02-26 DIAGNOSIS — N3020 Other chronic cystitis without hematuria: Secondary | ICD-10-CM | POA: Diagnosis not present

## 2021-02-26 DIAGNOSIS — Z8551 Personal history of malignant neoplasm of bladder: Secondary | ICD-10-CM | POA: Diagnosis not present

## 2021-02-26 DIAGNOSIS — N390 Urinary tract infection, site not specified: Secondary | ICD-10-CM | POA: Diagnosis not present

## 2021-03-06 DIAGNOSIS — N281 Cyst of kidney, acquired: Secondary | ICD-10-CM | POA: Diagnosis not present

## 2021-03-12 ENCOUNTER — Other Ambulatory Visit: Payer: Self-pay | Admitting: Family Medicine

## 2021-04-04 ENCOUNTER — Other Ambulatory Visit: Payer: Self-pay | Admitting: Family Medicine

## 2021-04-05 ENCOUNTER — Other Ambulatory Visit: Payer: Self-pay

## 2021-04-05 ENCOUNTER — Ambulatory Visit: Payer: Medicare Other

## 2021-04-05 DIAGNOSIS — Z5181 Encounter for therapeutic drug level monitoring: Secondary | ICD-10-CM

## 2021-04-05 DIAGNOSIS — Z7901 Long term (current) use of anticoagulants: Secondary | ICD-10-CM

## 2021-04-05 LAB — POCT INR: INR: 2.5 (ref 2.0–3.0)

## 2021-04-05 NOTE — Patient Instructions (Signed)
Description   Continue taking Warfarin 1 tablet warfarin everyday. Recheck INR in 6 weeks.  [phone] Coumadin clinic. Main # [phone].

## 2021-04-10 ENCOUNTER — Other Ambulatory Visit: Payer: Self-pay

## 2021-04-10 DIAGNOSIS — F331 Major depressive disorder, recurrent, moderate: Secondary | ICD-10-CM

## 2021-04-10 MED ORDER — ESCITALOPRAM OXALATE 20 MG PO TABS: 20.0000 mg | ORAL_TABLET | Freq: Every day | ORAL | 0 refills | Status: DC

## 2021-04-11 ENCOUNTER — Other Ambulatory Visit: Payer: Self-pay | Admitting: Internal Medicine

## 2021-04-13 ENCOUNTER — Other Ambulatory Visit: Payer: Self-pay | Admitting: Family Medicine

## 2021-04-16 ENCOUNTER — Telehealth: Payer: Self-pay

## 2021-04-16 NOTE — Telephone Encounter (Signed)
Patient refill request.  [facility] - [place]  HYDROcodone-acetaminophen (NORCO/VICODIN) 5-325 MG tablet [[id number]]

## 2021-04-16 NOTE — Telephone Encounter (Signed)
Pt scheduled for 04/17/21

## 2021-04-17 ENCOUNTER — Ambulatory Visit: Payer: Medicare Other | Admitting: Family Medicine

## 2021-04-17 DIAGNOSIS — F331 Major depressive disorder, recurrent, moderate: Secondary | ICD-10-CM

## 2021-04-24 ENCOUNTER — Other Ambulatory Visit: Payer: Self-pay

## 2021-04-25 ENCOUNTER — Ambulatory Visit (INDEPENDENT_AMBULATORY_CARE_PROVIDER_SITE_OTHER): Payer: Medicare Other | Admitting: Family Medicine

## 2021-04-25 ENCOUNTER — Encounter: Payer: Self-pay | Admitting: Family Medicine

## 2021-04-25 VITALS — BP 118/72 | HR 63 | Temp 97.6°F | Ht 61.0 in | Wt 201.0 lb

## 2021-04-25 DIAGNOSIS — M4316 Spondylolisthesis, lumbar region: Secondary | ICD-10-CM

## 2021-04-25 DIAGNOSIS — F5101 Primary insomnia: Secondary | ICD-10-CM | POA: Diagnosis not present

## 2021-04-25 DIAGNOSIS — I700 Atherosclerosis of aorta: Secondary | ICD-10-CM

## 2021-04-25 DIAGNOSIS — Z23 Encounter for immunization: Secondary | ICD-10-CM | POA: Diagnosis not present

## 2021-04-25 DIAGNOSIS — M47814 Spondylosis without myelopathy or radiculopathy, thoracic region: Secondary | ICD-10-CM

## 2021-04-25 DIAGNOSIS — E213 Hyperparathyroidism, unspecified: Secondary | ICD-10-CM

## 2021-04-25 DIAGNOSIS — Z6830 Body mass index (BMI) 30.0-30.9, adult: Secondary | ICD-10-CM

## 2021-04-25 DIAGNOSIS — I10 Essential (primary) hypertension: Secondary | ICD-10-CM | POA: Diagnosis not present

## 2021-04-25 DIAGNOSIS — E034 Atrophy of thyroid (acquired): Secondary | ICD-10-CM | POA: Diagnosis not present

## 2021-04-25 DIAGNOSIS — M47816 Spondylosis without myelopathy or radiculopathy, lumbar region: Secondary | ICD-10-CM | POA: Diagnosis not present

## 2021-04-25 DIAGNOSIS — F330 Major depressive disorder, recurrent, mild: Secondary | ICD-10-CM | POA: Diagnosis not present

## 2021-04-25 DIAGNOSIS — I2510 Atherosclerotic heart disease of native coronary artery without angina pectoris: Secondary | ICD-10-CM

## 2021-04-25 DIAGNOSIS — I4891 Unspecified atrial fibrillation: Secondary | ICD-10-CM | POA: Diagnosis not present

## 2021-04-25 DIAGNOSIS — E559 Vitamin D deficiency, unspecified: Secondary | ICD-10-CM

## 2021-04-25 DIAGNOSIS — D6869 Other thrombophilia: Secondary | ICD-10-CM | POA: Diagnosis not present

## 2021-04-25 DIAGNOSIS — F331 Major depressive disorder, recurrent, moderate: Secondary | ICD-10-CM

## 2021-04-25 DIAGNOSIS — M8580 Other specified disorders of bone density and structure, unspecified site: Secondary | ICD-10-CM | POA: Diagnosis not present

## 2021-04-25 DIAGNOSIS — G8929 Other chronic pain: Secondary | ICD-10-CM

## 2021-04-25 DIAGNOSIS — E785 Hyperlipidemia, unspecified: Secondary | ICD-10-CM

## 2021-04-25 LAB — COMPREHENSIVE METABOLIC PANEL
ALT: 11 U/L (ref 0–35)
AST: 13 U/L (ref 0–37)
Albumin: 4.2 g/dL (ref 3.5–5.2)
Alkaline Phosphatase: 67 U/L (ref 39–117)
BUN: 11 mg/dL (ref 6–23)
CO2: 32 mEq/L (ref 19–32)
Calcium: 10 mg/dL (ref 8.4–10.5)
Chloride: 101 mEq/L (ref 96–112)
Creatinine, Ser: 0.87 mg/dL (ref 0.40–1.20)
GFR: 64.16 mL/min (ref >60.00)
Glucose, Bld: 99 mg/dL (ref 70–99)
Potassium: 4.2 mEq/L (ref 3.5–5.1)
Sodium: 141 mEq/L (ref 135–145)
Total Bilirubin: 0.8 mg/dL (ref 0.2–1.2)
Total Protein: 6.7 g/dL (ref 6.0–8.3)

## 2021-04-25 LAB — LIPID PANEL
Cholesterol: 120 mg/dL (ref 0–200)
HDL: 50.3 mg/dL (ref >39.00)
LDL Cholesterol: 45 mg/dL (ref 0–99)
NonHDL: 70.14
Total CHOL/HDL Ratio: 2
Triglycerides: 124 mg/dL (ref 0.0–149.0)
VLDL: 24.8 mg/dL (ref 0.0–40.0)

## 2021-04-25 LAB — CBC
HCT: 40.8 % (ref 36.0–46.0)
Hemoglobin: 12.9 g/dL (ref 12.0–15.0)
MCHC: 31.7 g/dL (ref 30.0–36.0)
MCV: 81 fl (ref 78.0–100.0)
Platelets: 195 10*3/uL (ref 150.0–400.0)
RBC: 5.04 Mil/uL (ref 3.87–5.11)
RDW: 14.9 % (ref 11.5–15.5)
WBC: 5.8 10*3/uL (ref 4.0–10.5)

## 2021-04-25 LAB — VITAMIN D 25 HYDROXY (VIT D DEFICIENCY, FRACTURES): VITD: 39.07 ng/mL (ref 30.00–100.00)

## 2021-04-25 LAB — TSH: TSH: 5.6 u[IU]/mL — ABNORMAL HIGH (ref 0.35–5.50)

## 2021-04-25 MED ORDER — ATORVASTATIN CALCIUM 20 MG PO TABS: 20.0000 mg | ORAL_TABLET | Freq: Every day | ORAL | 3 refills | Status: DC

## 2021-04-25 MED ORDER — METOPROLOL TARTRATE 50 MG PO TABS
25.0000 mg | ORAL_TABLET | Freq: Two times a day (BID) | ORAL | 1 refills | Status: DC
Start: 2021-04-25 — End: 2021-09-25

## 2021-04-25 MED ORDER — PAROXETINE HCL 20 MG PO TABS
ORAL_TABLET | ORAL | 1 refills | Status: DC
Start: 2021-04-25 — End: 2021-09-25

## 2021-04-25 MED ORDER — LEVOTHYROXINE SODIUM 75 MCG PO TABS: ORAL_TABLET | ORAL | 3 refills | Status: DC

## 2021-04-25 MED ORDER — AMLODIPINE BESYLATE 10 MG PO TABS: 10.0000 mg | ORAL_TABLET | Freq: Every day | ORAL | 1 refills | Status: DC

## 2021-04-25 MED ORDER — GABAPENTIN 100 MG PO CAPS: ORAL_CAPSULE | ORAL | 5 refills | Status: DC

## 2021-04-25 MED ORDER — FUROSEMIDE 20 MG PO TABS: ORAL_TABLET | ORAL | 3 refills | Status: DC

## 2021-04-25 MED ORDER — TRAZODONE HCL 100 MG PO TABS: ORAL_TABLET | ORAL | 1 refills | Status: DC

## 2021-04-25 MED ORDER — BENAZEPRIL HCL 20 MG PO TABS: ORAL_TABLET | ORAL | 1 refills | Status: DC

## 2021-04-25 MED ORDER — POTASSIUM CHLORIDE CRYS ER 20 MEQ PO TBCR: EXTENDED_RELEASE_TABLET | ORAL | 5 refills | Status: DC

## 2021-04-25 MED ORDER — EZETIMIBE 10 MG PO TABS: ORAL_TABLET | ORAL | 3 refills | Status: DC

## 2021-04-25 MED ORDER — ESCITALOPRAM OXALATE 20 MG PO TABS: 20.0000 mg | ORAL_TABLET | Freq: Every day | ORAL | 1 refills | Status: DC

## 2021-04-25 MED ORDER — HYDROCODONE-ACETAMINOPHEN 5-325 MG PO TABS: 1.0000 | ORAL_TABLET | Freq: Two times a day (BID) | ORAL | 0 refills | Status: DC | PRN

## 2021-04-25 NOTE — Patient Instructions (Signed)
Great to see you today.  I have refilled the medication(s) we provide.   If labs were collected, we will inform you of lab results once received either by echart message or telephone call.   - echart message- for normal results that have been seen by the patient already.   - telephone call: abnormal results or if patient has not viewed results in their echart.  

## 2021-04-25 NOTE — Progress Notes (Signed)
Jennifer House , 05-24-1945, 76 y.o., female MRN: [id number] Patient Care Team    Relationship Specialty Notifications Start End  Ma Hillock, DO PCP - General Family Medicine  01/23/15   Thompson Grayer, MD Attending Physician Cardiology  07/25/11   Ronald Lobo, MD Consulting Physician Gastroenterology  08/25/15   Rana Snare, MD (Inactive) Consulting Physician Urology  08/25/15   Renelda Loma, OD  Optometry  08/25/15    Comment: "kimberly" at [facility]    Chief Complaint  Patient presents with   Hypertension    [place] [facility]; pt is fasting     Subjective:Jennifer House is a 76 y.o. female present for follow-up on  Hypertension/hyperlipidemia/hypertriglyceridemia/A. fib: Pt reports compliance with Benzapril 30 mg, amlodipine 10 mg, metoprolol 25 mg twice a day, Lasix 20 mg (MWF) today. Patient denies chest pain, shortness of breath, dizziness or lower extremity edema.  Pt is on warfarin therapy per cardiology. Pt is prescribed Zetia and tolerating the addition of statin.  Diet: Low-sodium Exercise: Tries to exercise  RF: Hypertension, hyperlipidemia, family history of heart disease and stroke, personal history A. Fib, obesity, former smoker  Depression/insomnia: Patient again reports Paxil 20 mg, Lexapro 20 mg and trazodone 100 mg is working well for her.  Paxil was increased last visit.  Unfortunately since that time her husband has passed away.  Patient is with her daughter today and both explained that she is grieving, but has a supportive family and is managing.    Encounter for chronic pain/lumbar arthritis: tramadol not effective.  Vicodin has helped with her pain and is keeping her active.  She does not routinely take the daytime dose, I encouraged her to try half a tab if she is having pain in the day .  She takes medication only when she really needs it- uses tylenol most days.She is compliant with the gabapentin.  Indication for chronic opioid: arthritis in the  thoracic and lumbar spine, most significantly in the lumbar spine Medication and dose: Vicodin 5-325 mg BID PRN # pills per month: 60 Last UDS date: Up-to-date. Pain contract signed (Y/N): Y Date narcotic database last reviewed (include red flags): 04/25/21    Depression screen [facility] 2/9 04/25/2021 01/03/2021 09/15/2020 05/10/2020 01/13/2020  Decreased Interest 0 0 1 0 1  Down, Depressed, Hopeless 0 0 1 0 0  PHQ - 2 Score 0 0 2 0 1  Altered sleeping 0 - 0 - 1  Tired, decreased energy 0 - 1 - 1  Change in appetite 0 - 0 - 0  Feeling bad or failure about yourself  0 - 0 - 1  Trouble concentrating 0 - 0 - 0  Moving slowly or fidgety/restless 0 - 0 - 1  Suicidal thoughts 0 - 0 - 0  PHQ-9 Score 0 - 3 - 5  Difficult doing work/chores - - - - -  Some recent data might be hidden    Allergies  Allergen Reactions   Ibandronate Sodium Other (See Comments)    Bones hurt   Pravachol [Pravastatin Sodium] Other (See Comments)    Myalgias    Nsaids     anticoagulated   Ultram [Tramadol Hcl] Nausea Only   Social History   Social History Narrative   Lives in [place], with husband [place].   Has 4 adult children , highest level of education is seventh grade.   Former smoker, denies recreational drugs or alcohol use.   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 Risa Grill   Closed displaced fracture of left femoral neck ([place]) 08/28/2017   Colon polyps    Depression    GERD (gastroesophageal reflux disease)    Headache(784.0)    hx of migraines   Hematuria    hx   HLD (hyperlipidemia)    mixed    HTN (hypertension)    Hypothyroidism    Insomnia    Liver function test abnormality    hx fatty liver   Lobar pneumonia ([place]) 10/19/2020   Medicare annual wellness visit, subsequent 08/25/2015   Murmur, cardiac    Nonalcoholic fatty liver disease    Obesity    Osteoarthritis    low back pain with left sciatica   Osteopenia    dexa 2013   Renal calculus    hx   RENAL CALCULUS, HX OF 01/31/2009   Qualifier: Diagnosis of  By: Selena Batten CMA, [place]     Restless leg syndrome    Past Surgical History:  Procedure Laterality Date   APPENDECTOMY     CARDIAC CATHETERIZATION  1/12   revealed normal cors and LV function   COLONOSCOPY     cystocopy  10/17/03   left stent placement, TURBT   FOOT SURGERY     LUMBAR LAMINECTOMY/DECOMPRESSION MICRODISCECTOMY N/A 01/19/2013   Procedure: LUMBAR THREE FOUR LUMBAR LAMINECTOMY/DECOMPRESSION MICRODISCECTOMY 1 LEVEL;  Surgeon: Faythe Ghee, MD;  Location: [facility];  Service: Neurosurgery;  Laterality: N/A;   TOTAL ABDOMINAL HYSTERECTOMY  1985   TOTAL HIP ARTHROPLASTY Left 08/28/2017   Procedure: TOTAL HIP ARTHROPLASTY ANTERIOR APPROACH;  Surgeon: Rod Can, MD;  Location: [facility];  Service: Orthopedics;  Laterality: Left;   Family History  Problem Relation Age of Onset   Heart failure Mother    Heart attack Mother    Hypertension Mother    Thyroid cancer Mother    Stroke Father    Hypertension Father    Stroke Brother    Breast cancer Neg Hx    Colon cancer Neg Hx    Allergies as of 04/25/2021       Reactions   Ibandronate Sodium Other (See Comments)   Bones hurt   Pravachol [pravastatin Sodium] Other (See Comments)   Myalgias   Nsaids    anticoagulated   Ultram [tramadol Hcl] Nausea Only        Medication List        Accurate as of April 25, 2021 12:06 PM. If you have any questions, ask your nurse or doctor.          amLODipine 10 MG tablet Commonly known as: NORVASC Take 1 tablet (10 mg total) by mouth daily.    atorvastatin 20 MG tablet Commonly known as: LIPITOR Take 1 tablet (20 mg total) by mouth daily.   benazepril 20 MG tablet Commonly known as: LOTENSIN TAKE 1 AND 1/2 TABLETS(30 MG) BY MOUTH DAILY   calcium carbonate 1500 (600 Ca) MG Tabs tablet Commonly known as: OSCAL Take 3 tablets by mouth daily.  Centrum Silver tablet Take 1 tablet by mouth daily.   cholecalciferol 1000 units tablet Commonly known as: VITAMIN D Take 1,000 Units by mouth daily.   escitalopram 20 MG tablet Commonly known as: LEXAPRO Take 1 tablet (20 mg total) by mouth daily.   ezetimibe 10 MG tablet Commonly known as: ZETIA TAKE 1 TABLET(10 MG) BY MOUTH DAILY   furosemide 20 MG tablet Commonly known as: LASIX TAKE 1 TABLET BY MOUTH EVERY MONDAY, WEDNESDAY AND FRIDAY   gabapentin 100 MG capsule Commonly known as: NEURONTIN 1 cap in morning and 2 caps at night   HYDROcodone-acetaminophen 5-325 MG tablet Commonly known as: NORCO/VICODIN Take 1 tablet by mouth 2 (two) times daily as needed for moderate pain.   levothyroxine 75 MCG tablet Commonly known as: SYNTHROID TAKE 1 TABLET(75 MCG) BY MOUTH DAILY   metoprolol tartrate 50 MG tablet Commonly known as: LOPRESSOR Take 0.5 tablets (25 mg total) by mouth 2 (two) times daily.   nitroGLYCERIN 0.4 MG SL tablet Commonly known as: NITROSTAT PLACE 1 TABLET UNDER THE TONGUE EVERY 5 MINUTES AS NEEDED FOR CHEST PAIN,(MAX 3 TABLETS)   PARoxetine 20 MG tablet Commonly known as: PAXIL TAKE 1 TABLET(20 MG) BY MOUTH DAILY   potassium chloride SA 20 MEQ tablet Commonly known as: KLOR-CON M 1 tab p.o. on days taking Lasix What changed: additional instructions Changed by: Howard Pouch, DO   traZODone 100 MG tablet Commonly known as: DESYREL TAKE 1 TABLET(100 MG) BY MOUTH AT BEDTIME   vitamin B-12 1000 MCG tablet Commonly known as: CYANOCOBALAMIN Take 1,000 mcg by mouth daily.   warfarin 5 MG tablet Commonly known as: COUMADIN Take as directed  by the anticoagulation clinic. If you are unsure how to take this medication, talk to your nurse or doctor. Original instructions: TAKE 1 TABLET DAILY AS DIRECTED BY COUMADIN CLINIC        All past medical history, surgical history, allergies, family history, immunizations andmedications were updated in the EMR today and reviewed under the history and medication portions of their EMR.     ROS: Negative, with the exception of above mentioned in HPI   Objective:  BP 118/72    Pulse 63    Temp 97.6 F (36.4 C) (Oral)    Ht '5\' 1"'  (1.549 m)    Wt 201 lb (91.2 kg)    SpO2 96%    BMI 37.98 kg/m  Body mass index is 37.98 kg/m. Physical Exam Vitals and nursing note reviewed.  Constitutional:      General: She is not in acute distress.    Appearance: Normal appearance. She is not ill-appearing, toxic-appearing or diaphoretic.  HENT:     Head: Normocephalic and atraumatic.  Eyes:     General: No scleral icterus.       Right eye: No discharge.        Left eye: No discharge.     Extraocular Movements: Extraocular movements intact.     Conjunctiva/sclera: Conjunctivae normal.     Pupils: Pupils are equal, round, and reactive to light.  Cardiovascular:     Rate and Rhythm: Normal rate and regular rhythm.     Heart sounds: No murmur heard.   No friction rub. No gallop.  Pulmonary:     Effort: Pulmonary effort is normal. No respiratory distress.     Breath sounds: Normal breath sounds. No wheezing, rhonchi or rales.  Musculoskeletal:     Cervical back: Neck supple. No tenderness.  Lymphadenopathy:  Cervical: No cervical adenopathy.  Skin:    General: Skin is warm and dry.     Coloration: Skin is not jaundiced or pale.     Findings: No erythema or rash.  Neurological:     Mental Status: She is alert and oriented to person, place, and time. Mental status is at baseline.     Motor: No weakness.     Gait: Gait normal.  Psychiatric:        Mood and Affect: Mood normal.         Behavior: Behavior normal.        Thought Content: Thought content normal.        Judgment: Judgment normal.     Assessment/Plan: Callie Facey is a 76 y.o. female present for OV for  Arthritis, lumbar spine/Osteoarthritis of thoracic spine, unspecified spinal osteoarthritis complication status/Spondylolisthesis at L3-L4 level/Encounter for chronic pain/morbid obesity Stable.   She is still having afternoon discomfort on some days.  She really tries to not take medication during the day if at all possible.  Encouraged her maybe she would like to try taking a half of a tab in the day to help with pain.  Medication has increased her quality of life and is able to keep her active.  - Chronic pain script: continue  hydrocodone 5-325 mg BID PRn  #180 supplied.  - UDS collected today - contract signed  - [place] controlled substance database reviewed 04/25/21 -continue gabapentin 100 mg 1 tab morning and 2 tabs before bed. - she is tolerating.  - f/u after 3 months if needing refills.  Essential hypertension, benign/hyperlipidemia/A. Fib/ obesity/Atrial fibrillation, unspecified type (HCC)/Aortic atherosclerosis (HCC)/acquired thrombophilia/morbid obesity Stable.  Continue  benzapril to 30 mg daily Continue  amlodipine 10  Continue Lasix MWF Continue  metoprolol 25 BID  -Continue Zetia 10 mg daily-Continue statin- tolerating.  -She did admit to a few mild dizzy episodes that resolved within a few seconds after she sat down over the last few months.  I have encouraged them to monitor with blood pressure cuff and heart rate during these times. - Continue follow-up with cardiology for atrial fibrillation and coumadin management - low salt diet, increase exercise.  CBC, CMP, TSH and lipids collected today -Follow up 5.5 months . Hypothyroid: Continue levothyroxine. TSH collected today.   Hypocalcemia/Osteopenia, unspecified location/hyperparathyroidism - takes vit d 1000u  daily. Last dexa 2013 - under the care of  endocrine.  -Vitamin D collected today  Insomnia, unspecified type/ Moderate episode of recurrent major depressive disorder (HCC) Stable Continue trazodone 100 mg nightly Continue  lexapro 20 mg daily Continue Paxil to 20 mg daily -5.5 months  Influenza vaccine provided today  Reviewed expectations re: course of current medical issues. Discussed self-management of symptoms. Outlined signs and symptoms indicating need for more acute intervention. Patient verbalized understanding and all questions were answered. Patient received an After-Visit Summary.   Orders Placed This Encounter  Procedures   Flu Vaccine QUAD High Dose(Fluad)   CBC   Comp Met (CMET)   TSH   Lipid panel   Drug Monitoring Panel [phone] , Urine   Vitamin D (25 hydroxy)    Meds ordered this encounter  Medications   amLODipine (NORVASC) 10 MG tablet    Sig: Take 1 tablet (10 mg total) by mouth daily.    Dispense:  90 tablet    Refill:  1   atorvastatin (LIPITOR) 20 MG tablet    Sig: Take 1 tablet (20 mg  total) by mouth daily.    Dispense:  90 tablet    Refill:  3   benazepril (LOTENSIN) 20 MG tablet    Sig: TAKE 1 AND 1/2 TABLETS(30 MG) BY MOUTH DAILY    Dispense:  135 tablet    Refill:  1   escitalopram (LEXAPRO) 20 MG tablet    Sig: Take 1 tablet (20 mg total) by mouth daily.    Dispense:  90 tablet    Refill:  1   ezetimibe (ZETIA) 10 MG tablet    Sig: TAKE 1 TABLET(10 MG) BY MOUTH DAILY    Dispense:  90 tablet    Refill:  3   furosemide (LASIX) 20 MG tablet    Sig: TAKE 1 TABLET BY MOUTH EVERY MONDAY, WEDNESDAY AND FRIDAY    Dispense:  45 tablet    Refill:  3   gabapentin (NEURONTIN) 100 MG capsule    Sig: 1 cap in morning and 2 caps at night    Dispense:  270 capsule    Refill:  5   HYDROcodone-acetaminophen (NORCO/VICODIN) 5-325 MG tablet    Sig: Take 1 tablet by mouth 2 (two) times daily as needed for moderate pain.    Dispense:  180 tablet     Refill:  0    Do not refill until after 10/15/2019   levothyroxine (SYNTHROID) 75 MCG tablet    Sig: TAKE 1 TABLET(75 MCG) BY MOUTH DAILY    Dispense:  90 tablet    Refill:  3   metoprolol tartrate (LOPRESSOR) 50 MG tablet    Sig: Take 0.5 tablets (25 mg total) by mouth 2 (two) times daily.    Dispense:  180 tablet    Refill:  1   PARoxetine (PAXIL) 20 MG tablet    Sig: TAKE 1 TABLET(20 MG) BY MOUTH DAILY    Dispense:  90 tablet    Refill:  1   potassium chloride SA (KLOR-CON M) 20 MEQ tablet    Sig: 1 tab p.o. on days taking Lasix    Dispense:  30 tablet    Refill:  5   traZODone (DESYREL) 100 MG tablet    Sig: TAKE 1 TABLET(100 MG) BY MOUTH AT BEDTIME    Dispense:  90 tablet    Refill:  1      Note is dictated utilizing voice recognition software. Although note has been proof read prior to signing, occasional typographical errors still can be missed. If any questions arise, please do not hesitate to call for verification.   electronically signed by:  Howard Pouch, DO  [place]

## 2021-04-26 ENCOUNTER — Other Ambulatory Visit: Payer: Self-pay | Admitting: Family Medicine

## 2021-04-26 MED ORDER — LEVOTHYROXINE SODIUM 75 MCG PO TABS: ORAL_TABLET | ORAL | 3 refills | Status: DC

## 2021-04-27 LAB — DRUG MONITORING PANEL 376104, URINE
Amphetamines: NEGATIVE ng/mL (ref <500)
Barbiturates: NEGATIVE ng/mL (ref <300)
Benzodiazepines: NEGATIVE ng/mL (ref <100)
Cocaine Metabolite: NEGATIVE ng/mL (ref <150)
Codeine: NEGATIVE ng/mL (ref <50)
Desmethyltramadol: NEGATIVE ng/mL (ref <100)
Hydrocodone: 91 ng/mL — ABNORMAL HIGH (ref <50)
Hydromorphone: NEGATIVE ng/mL (ref <50)
Morphine: NEGATIVE ng/mL (ref <50)
Norhydrocodone: 137 ng/mL — ABNORMAL HIGH (ref <50)
Opiates: POSITIVE ng/mL — AB (ref <100)
Oxycodone: NEGATIVE ng/mL (ref <100)
Tramadol: NEGATIVE ng/mL (ref <100)

## 2021-04-27 LAB — DM TEMPLATE

## 2021-05-17 ENCOUNTER — Ambulatory Visit: Payer: Medicare Other | Admitting: *Deleted

## 2021-05-17 ENCOUNTER — Other Ambulatory Visit: Payer: Self-pay

## 2021-05-17 DIAGNOSIS — I4891 Unspecified atrial fibrillation: Secondary | ICD-10-CM

## 2021-05-17 DIAGNOSIS — Z7901 Long term (current) use of anticoagulants: Secondary | ICD-10-CM

## 2021-05-17 LAB — POCT INR: INR: 1.6 — AB (ref 2.0–3.0)

## 2021-05-17 NOTE — Patient Instructions (Signed)
Description   ?When you get home take another 1/2 tablet then continue taking Warfarin 1 tablet warfarin everyday. Recheck INR in 4 weeks.  [phone] Coumadin clinic. Main # [phone].  ?  ?  ?

## 2021-06-01 ENCOUNTER — Telehealth: Payer: Self-pay

## 2021-06-01 NOTE — Telephone Encounter (Signed)
Called pt to schedule AWV. Please schedule with health coach, Toria or Brittley Regner. ? ?

## 2021-06-14 ENCOUNTER — Ambulatory Visit: Payer: Medicare Other | Admitting: *Deleted

## 2021-06-14 DIAGNOSIS — Z7901 Long term (current) use of anticoagulants: Secondary | ICD-10-CM

## 2021-06-14 DIAGNOSIS — I4891 Unspecified atrial fibrillation: Secondary | ICD-10-CM

## 2021-06-14 LAB — POCT INR: INR: 1.4 — AB (ref 2.0–3.0)

## 2021-06-14 NOTE — Patient Instructions (Signed)
Description   ?When you get home take another 1/2 tablet  and tomorrow take 1.5 tablets then start taking Warfarin 1 tablet warfarin everyday except 1.5 tablets on Mondays. Recheck INR in 1 week.  [phone] Coumadin clinic. Main # [phone].  ?  ?  ?

## 2021-06-22 ENCOUNTER — Ambulatory Visit: Payer: Medicare Other

## 2021-06-22 DIAGNOSIS — Z5181 Encounter for therapeutic drug level monitoring: Secondary | ICD-10-CM | POA: Diagnosis not present

## 2021-06-22 DIAGNOSIS — Z7901 Long term (current) use of anticoagulants: Secondary | ICD-10-CM

## 2021-06-22 LAB — POCT INR: INR: 2 (ref 2.0–3.0)

## 2021-06-22 NOTE — Patient Instructions (Signed)
-   continue taking Warfarin 1 tablet warfarin everyday except 1.5 tablets on Mondays.  ?- Recheck INR in 2 weeks   ?[phone] Coumadin clinic. Main # [phone].  ?

## 2021-07-06 ENCOUNTER — Ambulatory Visit: Payer: Medicare Other

## 2021-07-06 DIAGNOSIS — Z7901 Long term (current) use of anticoagulants: Secondary | ICD-10-CM | POA: Diagnosis not present

## 2021-07-06 DIAGNOSIS — I4891 Unspecified atrial fibrillation: Secondary | ICD-10-CM | POA: Diagnosis not present

## 2021-07-06 LAB — POCT INR: INR: 3.5 — AB (ref 2.0–3.0)

## 2021-07-06 NOTE — Patient Instructions (Addendum)
Description   ?- Hold today's dose and then continue taking Warfarin 1 tablet everyday except 1.5 tablets on Mondays.  ?- Stay consistent with greens (2-3 per week) ?- Recheck INR in 2 weeks   ?[phone] Coumadin clinic. Main # [phone].  ?  ?   ?

## 2021-07-10 NOTE — Progress Notes (Signed)
? ?Cardiology Office Note ?Date:  07/16/2021  ?Patient ID:  Jennifer House, Jennifer House 12/27/1945, MRN [id number] ?PCP:  Howard Pouch A, DO  ?Electrophysiologist: Dr. Rayann Heman ? ?  ?Chief Complaint:  6 mo ? ?History of Present Illness: ?Jennifer House is a 76 y.o. female with history of HTN, HLD, hypothyroidism, obesity, and permanent Afib ? ?She comes today to be seen for Dr. Rayann Heman, last seen by him via tele health 08/31/2018, at that time doing OK, no changes were made, planned for annual visit ? ?She had an ER visit 10/12/20 after a fall, seems without significant injuray and discharged from the ER.  There was suspect degree of dementia with poor short term memory, though son denied this as a known dx.  Was found with a pneumonia, EKg was rate controlled AF ?Saw her PMD 10/19/20 with ongoing management of her pneumonia ?I do not see a specific discussion regarding dementia or not ? ?I saw her 12/18/20 ?She comes today accompanied by her daughter. ?She is doing well, recovered from her UTI/pneumonia.  No trouble with falls, they report she is steady on her feet.  ?No CP, palpitations or cardiac awareness. ?No dizzy spells, near syncope or syncope. ?No bleeding or signs of bleeding ?Warfarin is managed here, her PMD manages and monitors her labs otherwise ?She was in rate controlled AFib without symptoms ?No changes were made, thoughts towards perhaps updating her echo next visit if needed/indicated ?Lipids were managed with her PMD ?Planned for an annual visit ? ?TODAY ?She is accompanied by her grand daughter today ?Her husband died 33 mo ago, since then her grand daughter has noticed she is not as active, not using her stationary bike the way she used to, less engaged perhaps. ?They have a trip coming up to [place] that she is looking forward to. ?The patient does say that she misses her husband very much and since his death, not as active, though not for any particular reason otherwise. ?Her grand daughter noticed 2  weeks ago she was quite swollen, especially her R foot/ankle though was on both sides. ?A week or so ago when shopping she felt like her legs were weak. ?The patiient is clear though she was not SOB, is not having any CP, palpitations. ?But her legs feel more weak. ?NO near syncope or syncope ?No bleeding or signs of bleeding ? ?INR today 3.8 ? ?Edema has nearly resolved without any particular intervention ?No extra lasix uses. ?And no particular trigger for the edema, dietary changes, no missed meds. ? ? ? ?Past Medical History:  ?Diagnosis Date  ? AKI (acute kidney injury) ([place]) 10/19/2020  ? Anxiety   ? Atrial fibrillation ([place])   ? longstanding persistent  ? 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 transitional cell hx; Dr. Risa Grill  ? Closed displaced fracture of left femoral neck ([place]) 08/28/2017  ? Colon polyps   ? Depression   ? GERD (gastroesophageal reflux disease)   ? Headache(784.0)   ? hx of migraines  ? Hematuria   ? hx  ? HLD (hyperlipidemia)   ? mixed  ? HTN (hypertension)   ? Hypothyroidism   ? Insomnia   ? Liver function test abnormality   ? hx fatty liver  ? Lobar pneumonia ([place]) 10/19/2020  ? Medicare annual wellness visit, subsequent 08/25/2015  ? Murmur, cardiac   ? Nonalcoholic fatty liver disease   ? Obesity   ? Osteoarthritis   ? low back pain with left sciatica  ?  Osteopenia   ? dexa 2013  ? Renal calculus   ? hx  ? RENAL CALCULUS, HX OF 01/31/2009  ? Qualifier: Diagnosis of  By: Selena Batten CMA, [place]    ? Restless leg syndrome   ? ? ?Past Surgical History:  ?Procedure Laterality Date  ? APPENDECTOMY    ? CARDIAC CATHETERIZATION  1/12  ? revealed normal cors and LV function  ? COLONOSCOPY    ? cystocopy  10/17/03  ? left stent placement, TURBT  ? FOOT SURGERY    ? LUMBAR LAMINECTOMY/DECOMPRESSION MICRODISCECTOMY N/A 01/19/2013  ? Procedure: LUMBAR THREE FOUR LUMBAR LAMINECTOMY/DECOMPRESSION MICRODISCECTOMY 1 LEVEL;  Surgeon: Faythe Ghee, MD;  Location: [facility];  Service: Neurosurgery;  Laterality: N/A;   ? TOTAL ABDOMINAL HYSTERECTOMY  1985  ? TOTAL HIP ARTHROPLASTY Left 08/28/2017  ? Procedure: TOTAL HIP ARTHROPLASTY ANTERIOR APPROACH;  Surgeon: Rod Can, MD;  Location: [facility];  Service: Orthopedics;  Laterality: Left;  ? ? ?Current Outpatient Medications  ?Medication Sig Dispense Refill  ? amLODipine (NORVASC) 10 MG tablet Take 1 tablet (10 mg total) by mouth daily. 90 tablet 1  ? atorvastatin (LIPITOR) 20 MG tablet Take 1 tablet (20 mg total) by mouth daily. 90 tablet 3  ? benazepril (LOTENSIN) 20 MG tablet TAKE 1 AND 1/2 TABLETS(30 MG) BY MOUTH DAILY 135 tablet 1  ? calcium carbonate (OSCAL) 1500 (600 Ca) MG TABS tablet Take 3 tablets by mouth daily.    ? cholecalciferol (VITAMIN D) 1000 UNITS tablet Take 1,000 Units by mouth daily.    ? escitalopram (LEXAPRO) 20 MG tablet Take 1 tablet (20 mg total) by mouth daily. 90 tablet 1  ? ezetimibe (ZETIA) 10 MG tablet TAKE 1 TABLET(10 MG) BY MOUTH DAILY 90 tablet 3  ? furosemide (LASIX) 20 MG tablet TAKE 1 TABLET BY MOUTH EVERY MONDAY, WEDNESDAY AND FRIDAY 45 tablet 3  ? gabapentin (NEURONTIN) 100 MG capsule 1 cap in morning and 2 caps at night 270 capsule 5  ? HYDROcodone-acetaminophen (NORCO/VICODIN) 5-325 MG tablet Take 1 tablet by mouth 2 (two) times daily as needed for moderate pain. 180 tablet 0  ? levothyroxine (SYNTHROID) 75 MCG tablet TAKE 1 TABLET(75 MCG) BY MOUTH DAILY. Monday- Saturday. 1.5 tabs on Sunday. 96 tablet 3  ? metoprolol tartrate (LOPRESSOR) 50 MG tablet Take 0.5 tablets (25 mg total) by mouth 2 (two) times daily. 180 tablet 1  ? Multiple Vitamins-Minerals (CENTRUM SILVER) tablet Take 1 tablet by mouth daily.    ? nitroGLYCERIN (NITROSTAT) 0.4 MG SL tablet PLACE 1 TABLET UNDER THE TONGUE EVERY 5 MINUTES AS NEEDED FOR CHEST PAIN,(MAX 3 TABLETS) 25 tablet 0  ? PARoxetine (PAXIL) 20 MG tablet TAKE 1 TABLET(20 MG) BY MOUTH DAILY 90 tablet 1  ? potassium chloride SA (KLOR-CON M) 20 MEQ tablet 1 tab p.o. on days taking Lasix 30 tablet 5  ?  traZODone (DESYREL) 100 MG tablet TAKE 1 TABLET(100 MG) BY MOUTH AT BEDTIME 90 tablet 1  ? vitamin B-12 (CYANOCOBALAMIN) 1000 MCG tablet Take 1,000 mcg by mouth daily.    ? warfarin (COUMADIN) 5 MG tablet TAKE 1 TABLET DAILY AS DIRECTED BY COUMADIN CLINIC 90 tablet 1  ? ?No current facility-administered medications for this visit.  ? ? ?Allergies:   Ibandronate sodium, Pravachol [pravastatin sodium], Nsaids, and Ultram [tramadol hcl]  ? ?Social History:  The patient  reports that she quit smoking about 36 years ago. Her smoking use included cigarettes. She has never used smokeless tobacco. She reports that  she does not drink alcohol and does not use drugs.  ? ?Family History:  The patient's family history includes Heart attack in her mother; Heart failure in her mother; Hypertension in her father and mother; Stroke in her brother and father; Thyroid cancer in her mother. ? ?ROS:  Please see the history of present illness.    ?All other systems are reviewed and otherwise negative.  ? ?PHYSICAL EXAM:  ?VS:  BP 118/66   Pulse (!) 56   Ht '5\' 3"'$  (1.6 m)   Wt 200 lb (90.7 kg)   SpO2 96%   BMI 35.43 kg/m?  BMI: Body mass index is 35.43 kg/m?. ?Well nourished, well developed, in no acute distress ?HEENT: normocephalic, atraumatic ?Neck: no JVD, carotid bruits or masses ?Cardiac:  irreg-irreg; no significant murmurs, no rubs, or gallops ?Lungs:  CTA b/l, no wheezing, rhonchi or rales ?Abd: soft, nontender ?MS: no deformity or atrophy ?Ext: trace ankle edema R, none otherwise ?Skin: warm and dry, no rash ?Neuro:  No gross deficits appreciated ?Psych: euthymic mood, full affect ? ? ? ?EKG:  done today and revieed by myself ?AFib 56bpm, no ST/T changes ? ? ? ?08/26/2017: TTE ?Study Conclusions  ?- Left ventricle: The cavity size was normal. Wall thickness was  ?  normal. Systolic function was normal. The estimated ejection  ?  fraction was in the range of 60% to 65%.  ?- Aortic valve: There was trivial regurgitation.  ?-  Mitral valve: Calcified annulus.  ?- Left atrium: The atrium was moderately to severely dilated.  ?- Right atrium: The atrium was moderately to severely dilated.  ?- Pulmonary arteries: PA peak pressure: 32 mm Hg (S)

## 2021-07-16 ENCOUNTER — Ambulatory Visit (INDEPENDENT_AMBULATORY_CARE_PROVIDER_SITE_OTHER): Payer: Medicare Other | Admitting: *Deleted

## 2021-07-16 ENCOUNTER — Ambulatory Visit: Payer: Medicare Other | Admitting: Physician Assistant

## 2021-07-16 ENCOUNTER — Encounter: Payer: Self-pay | Admitting: Physician Assistant

## 2021-07-16 VITALS — BP 118/66 | HR 56 | Ht 63.0 in | Wt 200.0 lb

## 2021-07-16 DIAGNOSIS — I10 Essential (primary) hypertension: Secondary | ICD-10-CM

## 2021-07-16 DIAGNOSIS — R609 Edema, unspecified: Secondary | ICD-10-CM

## 2021-07-16 DIAGNOSIS — R0600 Dyspnea, unspecified: Secondary | ICD-10-CM

## 2021-07-16 DIAGNOSIS — R600 Localized edema: Secondary | ICD-10-CM | POA: Diagnosis not present

## 2021-07-16 DIAGNOSIS — I4821 Permanent atrial fibrillation: Secondary | ICD-10-CM

## 2021-07-16 DIAGNOSIS — Z7901 Long term (current) use of anticoagulants: Secondary | ICD-10-CM | POA: Diagnosis not present

## 2021-07-16 DIAGNOSIS — I4891 Unspecified atrial fibrillation: Secondary | ICD-10-CM

## 2021-07-16 LAB — BASIC METABOLIC PANEL
BUN/Creatinine Ratio: 13 (ref 12–28)
BUN: 12 mg/dL (ref 8–27)
CO2: 28 mmol/L (ref 20–29)
Calcium: 9.3 mg/dL (ref 8.7–10.3)
Chloride: 104 mmol/L (ref 96–106)
Creatinine, Ser: 0.89 mg/dL (ref 0.57–1.00)
Glucose: 94 mg/dL (ref 70–99)
Potassium: 4.9 mmol/L (ref 3.5–5.2)
Sodium: 141 mmol/L (ref 134–144)
eGFR: 67 mL/min/{1.73_m2} (ref >59)

## 2021-07-16 LAB — POCT INR: INR: 3.8 — AB (ref 2.0–3.0)

## 2021-07-16 NOTE — Patient Instructions (Signed)
Medication Instructions:  ? ?Your physician recommends that you continue on your current medications as directed. Please refer to the Current Medication list given to you today. ? ? ?*If you need a refill on your cardiac medications before your next appointment, please call your pharmacy* ? ? ?Lab Work:  BMET TODAY  ? ? ?If you have labs (blood work) drawn today and your tests are completely normal, you will receive your results only by: ?MyChart Message (if you have MyChart) OR ?A paper copy in the mail ?If you have any lab test that is abnormal or we need to change your treatment, we will call you to review the results. ? ? ?Testing/Procedures: Your physician has requested that you have an echocardiogram. Echocardiography is a painless test that uses sound waves to create images of your heart. It provides your doctor with information about the size and shape of your heart and how well your heart?s chambers and valves are working. This procedure takes approximately one hour. There are no restrictions for this procedure. ? ? ? ?Follow-Up: ?At [facility], you and your health needs are our priority.  As part of our continuing mission to provide you with exceptional heart care, we have created designated Provider Care Teams.  These Care Teams include your primary Cardiologist (physician) and Advanced Practice Providers (APPs -  Physician Assistants and Nurse Practitioners) who all work together to provide you with the care you need, when you need it. ? ?We recommend signing up for the patient portal called "MyChart".  Sign up information is provided on this After Visit Summary.  MyChart is used to connect with patients for Virtual Visits (Telemedicine).  Patients are able to view lab/test results, encounter notes, upcoming appointments, etc.  Non-urgent messages can be sent to your provider as well.   ?To learn more about what you can do with MyChart, go to [email or web address].   ? ?Your next appointment:   ?4  month(s) ? ?The format for your next appointment:   ?In Person ? ?Provider:   ?You will see one of the following Advanced Practice Providers on your designated Care Team:   ?Tommye Standard, PA-C ?Legrand Como "Jonni Sanger" [place], PA-C ? ?{ ? ?Other Instructions ? ?Important Information About Sugar ? ? ? ? ?  ?

## 2021-07-16 NOTE — Patient Instructions (Addendum)
?  Description   ?Hold today's dose and then start taking Warfarin 1 tablet everyday. Stay consistent with greens (2-3 per week). Recheck INR in 10 days.  ?[phone] Coumadin clinic. Main # [phone].  ?  ?  ?

## 2021-07-26 ENCOUNTER — Ambulatory Visit: Payer: Medicare Other | Admitting: *Deleted

## 2021-07-26 DIAGNOSIS — Z7901 Long term (current) use of anticoagulants: Secondary | ICD-10-CM | POA: Diagnosis not present

## 2021-07-26 DIAGNOSIS — I4891 Unspecified atrial fibrillation: Secondary | ICD-10-CM | POA: Diagnosis not present

## 2021-07-26 LAB — POCT INR: INR: 3.6 — AB (ref 2.0–3.0)

## 2021-07-26 NOTE — Patient Instructions (Signed)
Description   ?Hold today's dose and then start taking Warfarin 1 tablet everyday except 1/2 tablet on Sundays. Stay consistent with greens (2-3 per week). Recheck INR in 2 weeks.  ?[phone] Coumadin clinic. Main # [phone].  ?  ?  ?

## 2021-08-07 ENCOUNTER — Ambulatory Visit: Payer: Medicare Other | Admitting: *Deleted

## 2021-08-07 ENCOUNTER — Ambulatory Visit (HOSPITAL_COMMUNITY): Payer: Medicare Other | Attending: Physician Assistant

## 2021-08-07 DIAGNOSIS — R600 Localized edema: Secondary | ICD-10-CM | POA: Insufficient documentation

## 2021-08-07 DIAGNOSIS — Z7901 Long term (current) use of anticoagulants: Secondary | ICD-10-CM

## 2021-08-07 DIAGNOSIS — R0609 Other forms of dyspnea: Secondary | ICD-10-CM

## 2021-08-07 DIAGNOSIS — R0600 Dyspnea, unspecified: Secondary | ICD-10-CM | POA: Insufficient documentation

## 2021-08-07 DIAGNOSIS — I4891 Unspecified atrial fibrillation: Secondary | ICD-10-CM

## 2021-08-07 LAB — ECHOCARDIOGRAM COMPLETE
Area-P 1/2: 4.02 cm2
P 1/2 time: 628 msec
S' Lateral: 2.9 cm

## 2021-08-07 LAB — POCT INR: INR: 3.5 — AB (ref 2.0–3.0)

## 2021-08-07 NOTE — Patient Instructions (Addendum)
Description   Hold today's dose and then start taking the dose you should be taking, which is, Warfarin 1 tablet everyday except 1/2 tablet on Sundays. Stay consistent with greens (2-3 per week). Recheck INR in 2 weeks.  [phone] Coumadin clinic. Main # [phone].

## 2021-08-21 ENCOUNTER — Ambulatory Visit: Payer: Medicare Other

## 2021-08-21 DIAGNOSIS — Z7901 Long term (current) use of anticoagulants: Secondary | ICD-10-CM | POA: Diagnosis not present

## 2021-08-21 LAB — POCT INR: INR: 2.2 (ref 2.0–3.0)

## 2021-08-21 NOTE — Patient Instructions (Signed)
Continue 1 tablet everyday except 1/2 tablet on Sundays. Stay consistent with greens (2-3 per week). Recheck INR in 4 weeks.  [phone] Coumadin clinic. Main # [phone].

## 2021-09-19 ENCOUNTER — Ambulatory Visit: Payer: Medicare Other | Admitting: *Deleted

## 2021-09-19 DIAGNOSIS — Z7901 Long term (current) use of anticoagulants: Secondary | ICD-10-CM | POA: Diagnosis not present

## 2021-09-19 DIAGNOSIS — Z5181 Encounter for therapeutic drug level monitoring: Secondary | ICD-10-CM | POA: Diagnosis not present

## 2021-09-19 LAB — POCT INR: INR: 2.2 (ref 2.0–3.0)

## 2021-09-19 NOTE — Patient Instructions (Signed)
Description   Continue 1 tablet everyday except 1/2 tablet on Sundays. Stay consistent with greens (2-3 per week). Recheck INR in 5 weeks.  [phone] Coumadin clinic. Main # [phone].

## 2021-09-26 ENCOUNTER — Ambulatory Visit: Payer: Medicare Other | Admitting: Family Medicine

## 2021-09-26 ENCOUNTER — Encounter: Payer: Self-pay | Admitting: Family Medicine

## 2021-09-26 VITALS — BP 129/75 | HR 61 | Temp 97.4°F | Ht 63.0 in | Wt 202.2 lb

## 2021-09-26 DIAGNOSIS — E785 Hyperlipidemia, unspecified: Secondary | ICD-10-CM

## 2021-09-26 DIAGNOSIS — I700 Atherosclerosis of aorta: Secondary | ICD-10-CM | POA: Diagnosis not present

## 2021-09-26 DIAGNOSIS — M8580 Other specified disorders of bone density and structure, unspecified site: Secondary | ICD-10-CM

## 2021-09-26 DIAGNOSIS — I2510 Atherosclerotic heart disease of native coronary artery without angina pectoris: Secondary | ICD-10-CM

## 2021-09-26 DIAGNOSIS — E034 Atrophy of thyroid (acquired): Secondary | ICD-10-CM | POA: Diagnosis not present

## 2021-09-26 DIAGNOSIS — F331 Major depressive disorder, recurrent, moderate: Secondary | ICD-10-CM | POA: Diagnosis not present

## 2021-09-26 DIAGNOSIS — E559 Vitamin D deficiency, unspecified: Secondary | ICD-10-CM | POA: Diagnosis not present

## 2021-09-26 DIAGNOSIS — F5101 Primary insomnia: Secondary | ICD-10-CM

## 2021-09-26 DIAGNOSIS — F330 Major depressive disorder, recurrent, mild: Secondary | ICD-10-CM

## 2021-09-26 DIAGNOSIS — I4891 Unspecified atrial fibrillation: Secondary | ICD-10-CM | POA: Diagnosis not present

## 2021-09-26 DIAGNOSIS — I10 Essential (primary) hypertension: Secondary | ICD-10-CM | POA: Diagnosis not present

## 2021-09-26 MED ORDER — POTASSIUM CHLORIDE CRYS ER 20 MEQ PO TBCR: EXTENDED_RELEASE_TABLET | ORAL | 5 refills | Status: DC

## 2021-09-26 MED ORDER — METOPROLOL TARTRATE 50 MG PO TABS: 25.0000 mg | ORAL_TABLET | Freq: Two times a day (BID) | ORAL | 1 refills | Status: DC

## 2021-09-26 MED ORDER — ESCITALOPRAM OXALATE 20 MG PO TABS: 20.0000 mg | ORAL_TABLET | Freq: Every day | ORAL | 1 refills | Status: DC

## 2021-09-26 MED ORDER — GABAPENTIN 100 MG PO CAPS: ORAL_CAPSULE | ORAL | 5 refills | Status: DC

## 2021-09-26 MED ORDER — HYDROCODONE-ACETAMINOPHEN 5-325 MG PO TABS
1.0000 | ORAL_TABLET | Freq: Two times a day (BID) | ORAL | 0 refills | Status: DC | PRN
Start: 2021-09-26 — End: 2022-02-27

## 2021-09-26 MED ORDER — TRAZODONE HCL 100 MG PO TABS
ORAL_TABLET | ORAL | 1 refills | Status: DC
Start: 2021-09-26 — End: 2022-02-27

## 2021-09-26 MED ORDER — BENAZEPRIL HCL 20 MG PO TABS: ORAL_TABLET | ORAL | 1 refills | Status: DC

## 2021-09-26 MED ORDER — PAROXETINE HCL 20 MG PO TABS
ORAL_TABLET | ORAL | 1 refills | Status: DC
Start: 2021-09-26 — End: 2022-02-27

## 2021-09-26 MED ORDER — AMLODIPINE BESYLATE 10 MG PO TABS
10.0000 mg | ORAL_TABLET | Freq: Every day | ORAL | 1 refills | Status: DC
Start: 2021-09-26 — End: 2022-02-27

## 2021-09-26 NOTE — Progress Notes (Signed)
Jennifer House , 1945-10-15, 76 y.o., female MRN: [id number] Patient Care Team    Relationship Specialty Notifications Start End  Ma Hillock, DO PCP - General Family Medicine  01/23/15   Thompson Grayer, MD Attending Physician Cardiology  07/25/11   Ronald Lobo, MD Consulting Physician Gastroenterology  08/25/15   Rana Snare, MD (Inactive) Consulting Physician Urology  08/25/15   Renelda Loma, OD  Optometry  08/25/15    Comment: "Joelene Millin" at [facility]  Vickie Epley, MD Consulting Physician Cardiology  08/06/21     Chief Complaint  Patient presents with   Hypertension    Pt is fasting     Subjective:Wilhelmine Mahala Elderkin is a 76 y.o. female present for follow-up on see [facility] Hypertension/hyperlipidemia/hypertriglyceridemia/A. fib: Pt reports compliance with Benzapril 30 mg, amlodipine 10 mg, metoprolol 25 mg twice a day, Lasix 20 mg (MWF) today. Patient denies chest pain, shortness of breath, dizziness or lower extremity edema.  Pt is on warfarin therapy per cardiology. Pt is prescribed Zetia and tolerating the addition of statin.  Diet: Low-sodium Exercise: Tries to exercise  RF: Hypertension, hyperlipidemia, family history of heart disease and stroke, personal history A. Fib, obesity, former smoker  Depression/insomnia: Patient reports compliance with Paxil 20 mg, Lexapro 20 mg and trazodone 100 mg .  She feels this regimen is working well for her.  Her husband passed away December 08, 2020 and her daughter has been helping her/emotional support.   Patient is with her daughter today and both explained that she is grieving, but has a supportive family and is managing.    Encounter for chronic pain/lumbar arthritis: tramadol not effective.  Vicodin has helped with her pain and is keeping her active.  She does not routinely take the daytime dose, but does take a few times a week.  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 Pain contract signed (Y/N): Y Date narcotic database last reviewed (include red flags): 09/26/21       09/26/2021    8:06 AM 04/25/2021   10:27 AM 01/03/2021    1:42 PM 09/15/2020    8:29 AM 05/10/2020   11:23 AM  Depression screen PHQ 2/9  Decreased Interest 0 0 0 1 0  Down, Depressed, Hopeless 0 0 0 1 0  PHQ - 2 Score 0 0 0 2 0  Altered sleeping  0  0   Tired, decreased energy  0  1   Change in appetite  0  0   Feeling bad or failure about yourself   0  0   Trouble concentrating  0  0   Moving slowly or fidgety/restless  0  0   Suicidal thoughts  0  0   PHQ-9 Score  0  3     Allergies  Allergen Reactions   Ibandronate Sodium Other (See Comments)    Bones hurt   Pravachol [Pravastatin Sodium] Other (See Comments)    Myalgias    Nsaids     anticoagulated   Ultram [Tramadol Hcl] Nausea Only   Social History   Social History Narrative   Lives in [place], with husband Massena.   Has 4 adult children , highest level of education is seventh grade.  Former smoker, denies recreational drugs or alcohol use.   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 Risa Grill   Closed displaced fracture of left femoral neck ([place]) 08/28/2017   Colon polyps    Depression    GERD (gastroesophageal reflux disease)    Headache(784.0)    hx of migraines   Hematuria    hx   HLD  (hyperlipidemia)    mixed   HTN (hypertension)    Hypothyroidism    Insomnia    Liver function test abnormality    hx fatty liver   Lobar pneumonia ([place]) 10/19/2020   Medicare annual wellness visit, subsequent 08/25/2015   Murmur, cardiac    Nonalcoholic fatty liver disease    Obesity    Osteoarthritis    low back pain with left sciatica   Osteopenia    dexa 2013   Renal calculus    hx   RENAL CALCULUS, HX OF 01/31/2009   Qualifier: Diagnosis of  By: Selena Batten CMA, [place]     Restless leg syndrome    Past Surgical History:  Procedure Laterality Date   APPENDECTOMY     CARDIAC CATHETERIZATION  1/12   revealed normal cors and LV function   COLONOSCOPY     cystocopy  10/17/03   left stent placement, TURBT   FOOT SURGERY     LUMBAR LAMINECTOMY/DECOMPRESSION MICRODISCECTOMY N/A 01/19/2013   Procedure: LUMBAR THREE FOUR LUMBAR LAMINECTOMY/DECOMPRESSION MICRODISCECTOMY 1 LEVEL;  Surgeon: Faythe Ghee, MD;  Location: [facility];  Service: Neurosurgery;  Laterality: N/A;   TOTAL ABDOMINAL HYSTERECTOMY  1985   TOTAL HIP ARTHROPLASTY Left 08/28/2017   Procedure: TOTAL HIP ARTHROPLASTY ANTERIOR APPROACH;  Surgeon: Rod Can, MD;  Location: [place];  Service: Orthopedics;  Laterality: Left;   Family History  Problem Relation Age of Onset   Heart failure Mother    Heart attack Mother    Hypertension Mother    Thyroid cancer Mother    Stroke Father    Hypertension Father    Stroke Brother    Breast cancer Neg Hx    Colon cancer Neg Hx    Allergies as of 09/26/2021       Reactions   Ibandronate Sodium Other (See Comments)   Bones hurt   Pravachol [pravastatin Sodium] Other (See Comments)   Myalgias   Nsaids    anticoagulated   Ultram [tramadol Hcl] Nausea Only        Medication List        Accurate as of September 26, 2021 12:45 PM. If you have any questions, ask your nurse or doctor.          amLODipine 10 MG tablet Commonly known as: NORVASC Take 1 tablet (10 mg  total) by mouth daily.   atorvastatin 20 MG tablet Commonly known as: LIPITOR Take 1 tablet (20 mg total) by mouth daily.   benazepril 20 MG tablet Commonly known as: LOTENSIN TAKE 1 AND 1/2 TABLETS(30 MG) BY MOUTH DAILY   calcium  carbonate 1500 (600 Ca) MG Tabs tablet Commonly known as: OSCAL Take 3 tablets by mouth daily.   Centrum Silver tablet Take 1 tablet by mouth daily.   cholecalciferol 1000 units tablet Commonly known as: VITAMIN D Take 1,000 Units by mouth daily.   escitalopram 20 MG tablet Commonly known as: LEXAPRO Take 1 tablet (20 mg total) by mouth daily.   ezetimibe 10 MG tablet Commonly known as: ZETIA TAKE 1 TABLET(10 MG) BY MOUTH DAILY   furosemide 20 MG tablet Commonly known as: LASIX TAKE 1 TABLET BY MOUTH EVERY MONDAY, WEDNESDAY AND FRIDAY   gabapentin 100 MG capsule Commonly known as: NEURONTIN 1 cap in morning and 2 caps at night   HYDROcodone-acetaminophen 5-325 MG tablet Commonly known as: NORCO/VICODIN Take 1 tablet by mouth 2 (two) times daily as needed for moderate pain.   levothyroxine 75 MCG tablet Commonly known as: SYNTHROID TAKE 1 TABLET(75 MCG) BY MOUTH DAILY. Monday- Saturday. 1.5 tabs on Sunday.   metoprolol tartrate 50 MG tablet Commonly known as: LOPRESSOR Take 0.5 tablets (25 mg total) by mouth 2 (two) times daily.   nitroGLYCERIN 0.4 MG SL tablet Commonly known as: NITROSTAT PLACE 1 TABLET UNDER THE TONGUE EVERY 5 MINUTES AS NEEDED FOR CHEST PAIN,(MAX 3 TABLETS)   PARoxetine 20 MG tablet Commonly known as: PAXIL TAKE 1 TABLET(20 MG) BY MOUTH DAILY   potassium chloride SA 20 MEQ tablet Commonly known as: KLOR-CON M 1 tab p.o. on days taking Lasix   traZODone 100 MG tablet Commonly known as: DESYREL TAKE 1 TABLET(100 MG) BY MOUTH AT BEDTIME   vitamin B-12 1000 MCG tablet Commonly known as: CYANOCOBALAMIN Take 1,000 mcg by mouth daily.   VITAMIN C PO Take by mouth.   warfarin 5 MG tablet Commonly known as:  COUMADIN Take as directed by the anticoagulation clinic. If you are unsure how to take this medication, talk to your nurse or doctor. Original instructions: TAKE 1 TABLET DAILY AS DIRECTED BY COUMADIN CLINIC        All past medical history, surgical history, allergies, family history, immunizations andmedications were updated in the EMR today and reviewed under the history and medication portions of their EMR.     ROS: Negative, with the exception of above mentioned in HPI   Objective:  BP 129/75   Pulse 61   Temp (!) 97.4 F (36.3 C)   Ht '5\' 3"'$  (1.6 m)   Wt 202 lb 3.2 oz (91.7 kg)   SpO2 96%   BMI 35.82 kg/m  Body mass index is 35.82 kg/m. Physical Exam Vitals and nursing note reviewed.  Constitutional:      General: She is not in acute distress.    Appearance: Normal appearance. She is not ill-appearing, toxic-appearing or diaphoretic.  HENT:     Head: Normocephalic and atraumatic.  Eyes:     General: No scleral icterus.       Right eye: No discharge.        Left eye: No discharge.     Extraocular Movements: Extraocular movements intact.     Conjunctiva/sclera: Conjunctivae normal.     Pupils: Pupils are equal, round, and reactive to light.  Cardiovascular:     Rate and Rhythm: Normal rate and regular rhythm.     Heart sounds: No murmur heard.    No friction rub. No gallop.  Pulmonary:     Effort: Pulmonary effort is normal. No respiratory distress.     Breath sounds: Normal breath sounds. No wheezing,  rhonchi or rales.  Musculoskeletal:     Cervical back: Neck supple. No tenderness.     Right lower leg: No edema.     Left lower leg: No edema.  Lymphadenopathy:     Cervical: No cervical adenopathy.  Skin:    General: Skin is warm and dry.     Coloration: Skin is not jaundiced or pale.     Findings: No erythema or rash.  Neurological:     Mental Status: She is alert and oriented to person, place, and time. Mental status is at baseline.     Motor: No  weakness.     Gait: Gait normal.  Psychiatric:        Mood and Affect: Mood normal.        Behavior: Behavior normal.        Thought Content: Thought content normal.        Judgment: Judgment normal.      Assessment/Plan: Rhylin Venters is a 76 y.o. female present for OV for  Arthritis, lumbar spine/Osteoarthritis of thoracic spine, unspecified spinal osteoarthritis complication status/Spondylolisthesis at L3-L4 level/Encounter for chronic pain/morbid obesity Stable Medication has increased her quality of life and is able to keep her active.  - Chronic pain script: continue  hydrocodone 5-325 mg BID PRn  #180 supplied.  - UDS up-to-date - contract signed  - [place] controlled substance database reviewed 09/26/21 -Continue gabapentin 100 mg 1 tab morning and 2 tabs before bed. - she is tolerating.  -Continue Vicodin 5-3 25 twice daily as needed - f/u after 3 months if needing refills.  Essential hypertension, benign/hyperlipidemia/A. Fib/ obesity/Atrial fibrillation, unspecified type (HCC)/Aortic atherosclerosis (HCC)/acquired thrombophilia/morbid obesity Stable Continue benzapril to 30 mg daily Continue amlodipine 10  Continue Lasix MWF Continue metoprolol 25 BID  -Continue Zetia 10 mg daily -continue statin- tolerating.  - Continue follow-up with cardiology for atrial fibrillation and coumadin management - low salt diet, increase exercise.  Labs due next visit. -Follow up 5.5 months . Hypothyroid: She has been instructed to continue her levothyroxine 1 tab daily except for on Sunday she was to take 1.5 tabs.  They had forgotten to change her pill packs to reflect this.   Continue levothyroxine 75 mcg daily, with an extra half a tablet on Sundays. TSH will be collected next visit   Hypocalcemia/Osteopenia, unspecified location/hyperparathyroidism - takes vit d 1000u daily. Last dexa 2013 - under the care of  endocrine.  -Vitamin D up-to-date  Insomnia,  unspecified type/ Moderate episode of recurrent major depressive disorder (HCC) Stable Continue trazodone 100 mg nightly Continue lexapro 20 mg daily Continue Paxil to 20 mg daily -5.5 months  Return in about 5 months (around 02/25/2022) for Routine chronic condition follow-up.  Reviewed expectations re: course of current medical issues. Discussed self-management of symptoms. Outlined signs and symptoms indicating need for more acute intervention. Patient verbalized understanding and all questions were answered. Patient received an After-Visit Summary.   No orders of the defined types were placed in this encounter.   Meds ordered this encounter  Medications   amLODipine (NORVASC) 10 MG tablet    Sig: Take 1 tablet (10 mg total) by mouth daily.    Dispense:  90 tablet    Refill:  1   benazepril (LOTENSIN) 20 MG tablet    Sig: TAKE 1 AND 1/2 TABLETS(30 MG) BY MOUTH DAILY    Dispense:  135 tablet    Refill:  1   escitalopram (LEXAPRO) 20 MG tablet  Sig: Take 1 tablet (20 mg total) by mouth daily.    Dispense:  90 tablet    Refill:  1   gabapentin (NEURONTIN) 100 MG capsule    Sig: 1 cap in morning and 2 caps at night    Dispense:  270 capsule    Refill:  5   metoprolol tartrate (LOPRESSOR) 50 MG tablet    Sig: Take 0.5 tablets (25 mg total) by mouth 2 (two) times daily.    Dispense:  180 tablet    Refill:  1   PARoxetine (PAXIL) 20 MG tablet    Sig: TAKE 1 TABLET(20 MG) BY MOUTH DAILY    Dispense:  90 tablet    Refill:  1   potassium chloride SA (KLOR-CON M) 20 MEQ tablet    Sig: 1 tab p.o. on days taking Lasix    Dispense:  30 tablet    Refill:  5   traZODone (DESYREL) 100 MG tablet    Sig: TAKE 1 TABLET(100 MG) BY MOUTH AT BEDTIME    Dispense:  90 tablet    Refill:  1   HYDROcodone-acetaminophen (NORCO/VICODIN) 5-325 MG tablet    Sig: Take 1 tablet by mouth 2 (two) times daily as needed for moderate pain.    Dispense:  180 tablet    Refill:  0      Note  is dictated utilizing voice recognition software. Although note has been proof read prior to signing, occasional typographical errors still can be missed. If any questions arise, please do not hesitate to call for verification.   electronically signed by:  Howard Pouch, DO  [place]

## 2021-09-26 NOTE — Patient Instructions (Addendum)
Return in about 5 months (around 02/25/2022) for Routine chronic condition follow-up.        Great to see you today.  I have refilled the medication(s) we provide.   If labs were collected, we will inform you of lab results once received either by echart message or telephone call.   - echart message- for normal results that have been seen by the patient already.   - telephone call: abnormal results or if patient has not viewed results in their echart.

## 2021-10-05 ENCOUNTER — Other Ambulatory Visit: Payer: Self-pay | Admitting: Physician Assistant

## 2021-10-24 ENCOUNTER — Ambulatory Visit: Payer: Medicare Other

## 2021-10-24 DIAGNOSIS — I4891 Unspecified atrial fibrillation: Secondary | ICD-10-CM | POA: Diagnosis not present

## 2021-10-24 DIAGNOSIS — Z7901 Long term (current) use of anticoagulants: Secondary | ICD-10-CM | POA: Diagnosis not present

## 2021-10-24 LAB — POCT INR: INR: 4 — AB (ref 2.0–3.0)

## 2021-10-24 NOTE — Patient Instructions (Signed)
Description   Hold today's dose and eat greens and then continue 1 tablet everyday except 1/2 tablet on Sundays. Stay consistent with greens (2-3 per week). Recheck INR in 5 weeks.  [phone] Coumadin clinic. Main # [phone].

## 2021-11-14 ENCOUNTER — Ambulatory Visit: Payer: Medicare Other | Attending: Internal Medicine

## 2021-11-14 DIAGNOSIS — Z7901 Long term (current) use of anticoagulants: Secondary | ICD-10-CM | POA: Diagnosis not present

## 2021-11-14 DIAGNOSIS — I4891 Unspecified atrial fibrillation: Secondary | ICD-10-CM

## 2021-11-14 LAB — POCT INR: INR: 2.2 (ref 2.0–3.0)

## 2021-11-14 NOTE — Patient Instructions (Signed)
Description   Continue 1 tablet everyday except 1/2 tablet on Sundays.  Stay consistent with greens (2-3 per week).  Recheck INR in 4 weeks.  [phone] Coumadin clinic. Main # [phone].

## 2021-12-03 ENCOUNTER — Telehealth: Payer: Self-pay

## 2021-12-03 NOTE — Telephone Encounter (Signed)
Patient refill request.  [facility] - [place]  furosemide (LASIX) 20 MG tablet [[id number]]

## 2021-12-04 NOTE — Telephone Encounter (Signed)
Pt informed to contact pharm med was sent for a year in feb.

## 2021-12-05 ENCOUNTER — Other Ambulatory Visit: Payer: Self-pay

## 2021-12-05 DIAGNOSIS — I10 Essential (primary) hypertension: Secondary | ICD-10-CM

## 2021-12-05 MED ORDER — METOPROLOL TARTRATE 50 MG PO TABS: 25.0000 mg | ORAL_TABLET | Freq: Two times a day (BID) | ORAL | 0 refills | Status: DC

## 2021-12-05 MED ORDER — FUROSEMIDE 20 MG PO TABS: ORAL_TABLET | ORAL | 1 refills | Status: DC

## 2021-12-11 ENCOUNTER — Ambulatory Visit: Payer: Medicare Other | Attending: Internal Medicine | Admitting: *Deleted

## 2021-12-11 DIAGNOSIS — I4891 Unspecified atrial fibrillation: Secondary | ICD-10-CM

## 2021-12-11 DIAGNOSIS — Z7901 Long term (current) use of anticoagulants: Secondary | ICD-10-CM

## 2021-12-11 LAB — POCT INR: INR: 2.9 (ref 2.0–3.0)

## 2021-12-11 NOTE — Patient Instructions (Signed)
Description   Continue taking 1 tablet everyday except 1/2 tablet on Sundays. Stay consistent with greens (2-3 per week). Recheck INR in 4 weeks.  [phone] or [phone] Coumadin clinic. Main # [phone].

## 2022-01-08 ENCOUNTER — Ambulatory Visit: Payer: Medicare Other | Attending: Cardiology

## 2022-01-08 DIAGNOSIS — I4891 Unspecified atrial fibrillation: Secondary | ICD-10-CM

## 2022-01-08 DIAGNOSIS — Z5181 Encounter for therapeutic drug level monitoring: Secondary | ICD-10-CM | POA: Diagnosis not present

## 2022-01-08 LAB — POCT INR: INR: 2.5 (ref 2.0–3.0)

## 2022-01-08 NOTE — Patient Instructions (Signed)
Continue taking 1 tablet everyday except 1/2 tablet on Sundays. Stay consistent with greens (2-3 per week). Recheck INR in 6 weeks.  #[phone] or [phone] Coumadin clinic. Main # [phone].  

## 2022-01-09 ENCOUNTER — Ambulatory Visit: Payer: Medicare Other

## 2022-02-06 ENCOUNTER — Ambulatory Visit: Payer: Medicare Other

## 2022-02-06 DIAGNOSIS — Z0000 Encounter for general adult medical examination without abnormal findings: Secondary | ICD-10-CM

## 2022-02-06 NOTE — Patient Instructions (Signed)

## 2022-02-06 NOTE — Progress Notes (Deleted)
Subjective:   Jennifer House is a 76 y.o. female who presents for Medicare Annual (Subsequent) preventive examination. I connected with  [name] on 02/06/22 by a audio enabled telemedicine application and verified that I am speaking with the correct person using two identifiers.  Patient Location: Home  Provider Location: Office/Clinic  I discussed the limitations of evaluation and management by telemedicine. The patient expressed understanding and agreed to proceed.   Review of Systems    Defer to PCP        Objective:    There were no vitals filed for this visit. There is no height or weight on file to calculate BMI.     10/12/2020    9:59 AM 12/29/2019    2:20 PM 08/28/2017   12:49 PM 09/19/2016    9:54 AM 01/19/2013   10:37 AM 01/18/2013   12:21 PM  Advanced Directives  Does Patient Have a Medical Advance Directive? No No No No Patient does not have advance directive Patient does not have advance directive;Patient would like information  Would patient like information on creating a medical advance directive?  Yes (MAU/Ambulatory/Procedural Areas - Information given) No - Patient declined Yes (MAU/Ambulatory/Procedural Areas - Information given) Advance directive packet given Advance directive packet given  Pre-existing out of facility DNR order (yellow form or pink MOST form)     No     Current Medications (verified) Outpatient Encounter Medications as of 02/06/2022  Medication Sig   amLODipine (NORVASC) 10 MG tablet Take 1 tablet (10 mg total) by mouth daily.   Ascorbic Acid (VITAMIN C PO) Take by mouth.   atorvastatin (LIPITOR) 20 MG tablet Take 1 tablet (20 mg total) by mouth daily.   benazepril (LOTENSIN) 20 MG tablet TAKE 1 AND 1/2 TABLETS(30 MG) BY MOUTH DAILY   calcium carbonate (OSCAL) 1500 (600 Ca) MG TABS tablet Take 3 tablets by mouth daily.   cholecalciferol (VITAMIN D) 1000 UNITS tablet Take 1,000 Units by mouth daily.   escitalopram  (LEXAPRO) 20 MG tablet Take 1 tablet (20 mg total) by mouth daily.   ezetimibe (ZETIA) 10 MG tablet TAKE 1 TABLET(10 MG) BY MOUTH DAILY   furosemide (LASIX) 20 MG tablet TAKE 1 TABLET BY MOUTH EVERY MONDAY, WEDNESDAY AND FRIDAY   gabapentin (NEURONTIN) 100 MG capsule 1 cap in morning and 2 caps at night   HYDROcodone-acetaminophen (NORCO/VICODIN) 5-325 MG tablet Take 1 tablet by mouth 2 (two) times daily as needed for moderate pain.   levothyroxine (SYNTHROID) 75 MCG tablet TAKE 1 TABLET(75 MCG) BY MOUTH DAILY. Monday- Saturday. 1.5 tabs on Sunday.   metoprolol tartrate (LOPRESSOR) 50 MG tablet Take 0.5 tablets (25 mg total) by mouth 2 (two) times daily.   Multiple Vitamins-Minerals (CENTRUM SILVER) tablet Take 1 tablet by mouth daily.   nitroGLYCERIN (NITROSTAT) 0.4 MG SL tablet PLACE 1 TABLET UNDER THE TONGUE EVERY 5 MINUTES AS NEEDED FOR CHEST PAIN,(MAX 3 TABLETS)   PARoxetine (PAXIL) 20 MG tablet TAKE 1 TABLET(20 MG) BY MOUTH DAILY   potassium chloride SA (KLOR-CON M) 20 MEQ tablet 1 tab p.o. on days taking Lasix   traZODone (DESYREL) 100 MG tablet TAKE 1 TABLET(100 MG) BY MOUTH AT BEDTIME   vitamin B-12 (CYANOCOBALAMIN) 1000 MCG tablet Take 1,000 mcg by mouth daily.   warfarin (COUMADIN) 5 MG tablet TAKE 1 TABLET BY MOUTH DAILY AS DIRECTED BY COUMADIN CLINIC   No facility-administered encounter medications on file as of 02/06/2022.    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 Risa Grill   Closed displaced fracture of left femoral neck ([place]) 08/28/2017   Colon polyps    Depression    GERD (gastroesophageal reflux disease)    Headache(784.0)    hx of migraines   Hematuria    hx   HLD (hyperlipidemia)     mixed   HTN (hypertension)    Hypothyroidism    Insomnia    Liver function test abnormality    hx fatty liver   Lobar pneumonia ([place]) 10/19/2020   Medicare annual wellness visit, subsequent 08/25/2015   Murmur, cardiac    Nonalcoholic fatty liver disease    Obesity    Osteoarthritis    low back pain with left sciatica   Osteopenia    dexa 2013   Renal calculus    hx   RENAL CALCULUS, HX OF 01/31/2009   Qualifier: Diagnosis of  By: Selena Batten CMA, [place]     Restless leg syndrome    Past Surgical History:  Procedure Laterality Date   APPENDECTOMY     CARDIAC CATHETERIZATION  1/12   revealed normal cors and LV function   COLONOSCOPY     cystocopy  10/17/03   left stent placement, TURBT   FOOT SURGERY     LUMBAR LAMINECTOMY/DECOMPRESSION MICRODISCECTOMY N/A 01/19/2013   Procedure: LUMBAR THREE FOUR LUMBAR LAMINECTOMY/DECOMPRESSION MICRODISCECTOMY 1 LEVEL;  Surgeon: Faythe Ghee, MD;  Location: [facility];  Service: Neurosurgery;  Laterality: N/A;   TOTAL ABDOMINAL HYSTERECTOMY  1985   TOTAL HIP ARTHROPLASTY Left 08/28/2017   Procedure: TOTAL HIP ARTHROPLASTY ANTERIOR APPROACH;  Surgeon: Rod Can, MD;  Location: [facility];  Service: Orthopedics;  Laterality: Left;   Family History  Problem Relation Age of Onset   Heart failure Mother    Heart attack Mother    Hypertension Mother    Thyroid cancer Mother    Stroke Father    Hypertension Father    Stroke Brother    Breast cancer Neg Hx    Colon cancer Neg Hx    Social History   Socioeconomic History   Marital status: Married    Spouse name: Not on file   Number of children: Not on file   Years of education: Not on file   Highest education level: Not on file  Occupational History   Not on file  Tobacco Use   Smoking status: Former    Types: Cigarettes    Quit date: 03/18/1985    Years since quitting: 36.9   Smokeless tobacco: Never   Tobacco comments:    denies   Vaping Use   Vaping Use: Never used  Substance  and Sexual Activity   Alcohol use: No   Drug use: No   Sexual activity: Never  Other Topics Concern   Not on file  Social History Narrative   Lives in [place], with husband Eddie Dibbles.   Has 4 adult children , highest level of education is seventh grade.   Former smoker, denies recreational drugs or alcohol use.   Patient drink caffeine beverages, takes a daily vitamin   Patient was her seatbelt , she has dentures , she has a smoke detector in the home , there are guns in the  home a  Locked case    Patient feels safe in her relationships       Social Determinants of Health   Financial Resource Strain: Low Risk  (01/03/2021)   Overall Financial Resource Strain (CARDIA)    Difficulty of Paying Living Expenses: Not hard at all  Food Insecurity: No Food Insecurity (01/03/2021)   Hunger Vital Sign    Worried About Running Out of Food in the Last Year: Never true    Ran Out of Food in the Last Year: Never true  Transportation Needs: No Transportation Needs (01/03/2021)   PRAPARE - Hydrologist (Medical): No    Lack of Transportation (Non-Medical): No  Physical Activity: Insufficiently Active (01/03/2021)   Exercise Vital Sign    Days of Exercise per Week: 4 days    Minutes of Exercise per Session: 30 min  Stress: No Stress Concern Present (01/03/2021)   [place]    Feeling of Stress : Not at all  Social Connections: Socially Isolated (01/03/2021)   Social Connection and Isolation Panel [NHANES]    Frequency of Communication with Friends and Family: Three times a week    Frequency of Social Gatherings with Friends and Family: More than three times a week    Attends Religious Services: Never    Marine scientist or Organizations: No    Attends Archivist Meetings: Never    Marital Status: Widowed    Tobacco Counseling Counseling given: Not Answered Tobacco comments:  denies    Clinical Intake:                 Diabetic?No          Activities of Daily Living     No data to display           Patient Care Team: Ma Hillock, DO as PCP - General (Family Medicine) Thompson Grayer, MD as Attending Physician (Cardiology) Ronald Lobo, MD as Consulting Physician (Gastroenterology) Rana Snare, MD (Inactive) as Consulting Physician (Urology) Renelda Loma, OD (Optometry) Vickie Epley, MD as Consulting Physician (Cardiology)  Indicate any recent Medical Services you may have received from other than [facility] providers in the past year (date may be approximate).     Assessment:   This is a routine wellness examination for Jennifer House.  Hearing/Vision screen No results found.  Dietary issues and exercise activities discussed:     Goals Addressed   None   Depression Screen    09/26/2021    8:06 AM 04/25/2021   10:27 AM 01/03/2021    1:42 PM 09/15/2020    8:29 AM 05/10/2020   11:23 AM 01/13/2020   10:51 AM 12/29/2019    2:21 PM  PHQ 2/9 Scores  PHQ - 2 Score 0 0 0 2 0 1 0  PHQ- 9 Score  0  3  5     Fall Risk    01/03/2021    1:33 PM 12/29/2019    2:20 PM 06/02/2018    1:56 PM 01/12/2018    1:08 PM 09/09/2017   10:57 AM  [place] in the past year? 0 0 0 No Yes  Number falls in past yr: 0 0     Injury with Fall? 0 0   Yes  Risk Factor Category      High Fall Risk  Risk for fall due to :   Medication side effect  Impaired balance/gait  Follow up Falls evaluation completed;Falls prevention discussed Falls prevention discussed Education provided      FALL RISK PREVENTION PERTAINING TO THE HOME:  Any stairs in or around the home? {YES/NO:21197} If so, are there any without handrails? {YES/NO:21197} Home free of loose throw rugs in walkways, pet beds, electrical cords, etc? {YES/NO:21197} Adequate lighting in your home to reduce risk of falls? {YES/NO:21197}  ASSISTIVE DEVICES UTILIZED TO PREVENT  FALLS:  Life alert? {YES/NO:21197} Use of a cane, walker or w/c? {YES/NO:21197} Grab bars in the bathroom? {YES/NO:21197} Shower chair or bench in shower? {YES/NO:21197} Elevated toilet seat or a handicapped toilet? {YES/NO:21197}  TIMED UP AND GO:  Was the test performed? No .  Length of time to ambulate 10 feet: 0 sec.     Cognitive Function:    09/19/2016    9:57 AM  MMSE - Mini Mental State Exam  Orientation to time 5  Orientation to Place 5  Registration 3  Attention/ Calculation 2  Recall 2  Language- name 2 objects 2  Language- repeat 1  Language- follow 3 step command 3  Language- read & follow direction 1  Write a sentence 0  Copy design 1  Total score 25        Immunizations Immunization History  Administered Date(s) Administered   Fluad Quad(high Dose 65+) 11/30/2018, 01/13/2020, 04/25/2021   Influenza, High Dose Seasonal PF 01/02/2017, 01/12/2018   Influenza,inj,Quad PF,6+ Mos 01/23/2015, 12/15/2015   Pneumococcal Conjugate-13 08/18/2014   Pneumococcal Polysaccharide-23 02/27/2009, 08/25/2015   Tdap 02/22/2015    TDAP status: Up to date  Flu Vaccine status: Due, Education has been provided regarding the importance of this vaccine. Advised may receive this vaccine at local pharmacy or Health Dept. Aware to provide a copy of the vaccination record if obtained from local pharmacy or Health Dept. Verbalized acceptance and understanding.  Pneumococcal vaccine status: Up to date  Covid-19 vaccine status: Declined, Education has been provided regarding the importance of this vaccine but patient still declined. Advised may receive this vaccine at local pharmacy or Health Dept.or vaccine clinic. Aware to provide a copy of the vaccination record if obtained from local pharmacy or Health Dept. Verbalized acceptance and understanding.  Qualifies for Shingles Vaccine? No   Zostavax completed  N/A   Shingrix Completed?: No.    Education has been provided regarding  the importance of this vaccine. Patient has been advised to call insurance company to determine out of pocket expense if they have not yet received this vaccine. Advised may also receive vaccine at local pharmacy or Health Dept. Verbalized acceptance and understanding.  Screening Tests Health Maintenance  Topic Date Due   Zoster Vaccines- Shingrix (1 of 2) Never done   MAMMOGRAM  01/18/2021   INFLUENZA VACCINE  10/16/2021   Medicare Annual Wellness (AWV)  02/07/2023   Pneumonia Vaccine 27+ Years old  Completed   DEXA SCAN  Completed   Hepatitis C Screening  Completed   HPV VACCINES  Aged Out   COLONOSCOPY (Pts 45-48yr Insurance coverage will need to be confirmed)  Discontinued   COVID-19 Vaccine  Discontinued    Health Maintenance  Health Maintenance Due  Topic Date Due   Zoster Vaccines- Shingrix (1 of 2) Never done   MAMMOGRAM  01/18/2021   INFLUENZA VACCINE  10/16/2021    Colorectal cancer screening: No longer required.   Mammogram status: Completed 9/22/82020. Repeat every year  Bone Density status: Completed 12/08/2018. Results reflect: Bone density results: OSTEOPENIA. Repeat  every 0 years.  Lung Cancer Screening: (Low Dose CT Chest recommended if Age 75-80 years, 30 pack-year currently smoking OR have quit w/in 15years.) {DOES NOT does:27190::"does not"} qualify.   Lung Cancer Screening Referral: ***  Additional Screening:  Hepatitis C Screening: does qualify; Completed 08/25/2015  Vision Screening: Recommended annual ophthalmology exams for early detection of glaucoma and other disorders of the eye. Is the patient up to date with their annual eye exam?  {YES/NO:21197} Who is the provider or what is the name of the office in which the patient attends annual eye exams? *** If pt is not established with a provider, would they like to be referred to a provider to establish care? {YES/NO:21197}.   Dental Screening: Recommended annual dental exams for proper oral  hygiene  Community Resource Referral / Chronic Care Management: CRR required this visit?  {YES/NO:21197}  CCM required this visit?  {YES/NO:21197}     Plan:     I have personally reviewed and noted the following in the patient's chart:   Medical and social history Use of alcohol, tobacco or illicit drugs  Current medications and supplements including opioid prescriptions. {Opioid Prescriptions:[phone]} Functional ability and status Nutritional status Physical activity Advanced directives List of other physicians Hospitalizations, surgeries, and ER visits in previous 12 months Vitals Screenings to include cognitive, depression, and falls Referrals and appointments  In addition, I have reviewed and discussed with patient certain preventive protocols, quality metrics, and best practice recommendations. A written personalized care plan for preventive services as well as general preventive health recommendations were provided to patient.     Edgar Frisk, [facility]   02/06/2022   Nurse Notes: Non Face to Face 30 minute visit

## 2022-02-19 ENCOUNTER — Ambulatory Visit: Payer: Medicare Other | Attending: Cardiology

## 2022-02-19 DIAGNOSIS — I4891 Unspecified atrial fibrillation: Secondary | ICD-10-CM | POA: Diagnosis not present

## 2022-02-19 DIAGNOSIS — Z5181 Encounter for therapeutic drug level monitoring: Secondary | ICD-10-CM

## 2022-02-19 LAB — POCT INR: INR: 3.8 — AB (ref 2.0–3.0)

## 2022-02-19 NOTE — Patient Instructions (Signed)
HOLD TODAY ONLY THEN Continue taking 1 tablet everyday except 1/2 tablet on Sundays. Stay consistent with greens (2-3 per week). Recheck INR in 3 weeks.  [phone] or [phone] Coumadin clinic. Main # [phone].

## 2022-02-27 ENCOUNTER — Encounter: Payer: Self-pay | Admitting: Family Medicine

## 2022-02-27 ENCOUNTER — Ambulatory Visit (INDEPENDENT_AMBULATORY_CARE_PROVIDER_SITE_OTHER): Payer: Medicare Other | Admitting: Family Medicine

## 2022-02-27 VITALS — BP 129/76 | HR 64 | Temp 97.4°F | Ht 63.0 in | Wt 204.0 lb

## 2022-02-27 DIAGNOSIS — E559 Vitamin D deficiency, unspecified: Secondary | ICD-10-CM

## 2022-02-27 DIAGNOSIS — E785 Hyperlipidemia, unspecified: Secondary | ICD-10-CM | POA: Diagnosis not present

## 2022-02-27 DIAGNOSIS — I700 Atherosclerosis of aorta: Secondary | ICD-10-CM | POA: Diagnosis not present

## 2022-02-27 DIAGNOSIS — I4891 Unspecified atrial fibrillation: Secondary | ICD-10-CM

## 2022-02-27 DIAGNOSIS — F5101 Primary insomnia: Secondary | ICD-10-CM | POA: Diagnosis not present

## 2022-02-27 DIAGNOSIS — I10 Essential (primary) hypertension: Secondary | ICD-10-CM

## 2022-02-27 DIAGNOSIS — F330 Major depressive disorder, recurrent, mild: Secondary | ICD-10-CM

## 2022-02-27 DIAGNOSIS — I2510 Atherosclerotic heart disease of native coronary artery without angina pectoris: Secondary | ICD-10-CM

## 2022-02-27 DIAGNOSIS — E213 Hyperparathyroidism, unspecified: Secondary | ICD-10-CM

## 2022-02-27 DIAGNOSIS — M47814 Spondylosis without myelopathy or radiculopathy, thoracic region: Secondary | ICD-10-CM | POA: Diagnosis not present

## 2022-02-27 DIAGNOSIS — Z23 Encounter for immunization: Secondary | ICD-10-CM

## 2022-02-27 DIAGNOSIS — M8580 Other specified disorders of bone density and structure, unspecified site: Secondary | ICD-10-CM | POA: Diagnosis not present

## 2022-02-27 DIAGNOSIS — E034 Atrophy of thyroid (acquired): Secondary | ICD-10-CM

## 2022-02-27 DIAGNOSIS — F331 Major depressive disorder, recurrent, moderate: Secondary | ICD-10-CM

## 2022-02-27 DIAGNOSIS — M4316 Spondylolisthesis, lumbar region: Secondary | ICD-10-CM

## 2022-02-27 DIAGNOSIS — Z79899 Other long term (current) drug therapy: Secondary | ICD-10-CM

## 2022-02-27 DIAGNOSIS — D6869 Other thrombophilia: Secondary | ICD-10-CM

## 2022-02-27 DIAGNOSIS — G8929 Other chronic pain: Secondary | ICD-10-CM

## 2022-02-27 DIAGNOSIS — M47816 Spondylosis without myelopathy or radiculopathy, lumbar region: Secondary | ICD-10-CM

## 2022-02-27 MED ORDER — TRAZODONE HCL 100 MG PO TABS: ORAL_TABLET | ORAL | 1 refills | Status: DC

## 2022-02-27 MED ORDER — PAROXETINE HCL 20 MG PO TABS: ORAL_TABLET | ORAL | 1 refills | Status: DC

## 2022-02-27 MED ORDER — BENAZEPRIL HCL 20 MG PO TABS
ORAL_TABLET | ORAL | 1 refills | Status: DC
Start: 2022-02-27 — End: 2022-08-20

## 2022-02-27 MED ORDER — METOPROLOL TARTRATE 50 MG PO TABS: 25.0000 mg | ORAL_TABLET | Freq: Two times a day (BID) | ORAL | 1 refills | Status: DC

## 2022-02-27 MED ORDER — POTASSIUM CHLORIDE CRYS ER 20 MEQ PO TBCR: EXTENDED_RELEASE_TABLET | ORAL | 5 refills | Status: DC

## 2022-02-27 MED ORDER — HYDROCODONE-ACETAMINOPHEN 5-325 MG PO TABS: 1.0000 | ORAL_TABLET | Freq: Two times a day (BID) | ORAL | 0 refills | Status: DC | PRN

## 2022-02-27 MED ORDER — AMLODIPINE BESYLATE 10 MG PO TABS: 10.0000 mg | ORAL_TABLET | Freq: Every day | ORAL | 1 refills | Status: DC

## 2022-02-27 MED ORDER — ATORVASTATIN CALCIUM 20 MG PO TABS
20.0000 mg | ORAL_TABLET | Freq: Every day | ORAL | 3 refills | Status: DC
Start: 2022-02-27 — End: 2023-02-03

## 2022-02-27 MED ORDER — FUROSEMIDE 20 MG PO TABS: ORAL_TABLET | ORAL | 1 refills | Status: DC

## 2022-02-27 MED ORDER — ESCITALOPRAM OXALATE 20 MG PO TABS: 20.0000 mg | ORAL_TABLET | Freq: Every day | ORAL | 1 refills | Status: DC

## 2022-02-27 MED ORDER — GABAPENTIN 100 MG PO CAPS: ORAL_CAPSULE | ORAL | 5 refills | Status: DC

## 2022-02-27 MED ORDER — EZETIMIBE 10 MG PO TABS: ORAL_TABLET | ORAL | 3 refills | Status: DC

## 2022-02-27 NOTE — Progress Notes (Signed)
Jennifer House , 15-Oct-1945, 76 y.o., female MRN: [id number] Patient Care Team    Relationship Specialty Notifications Start End  Ma Hillock, DO PCP - General Family Medicine  01/23/15   Thompson Grayer, MD Attending Physician Cardiology  07/25/11   Ronald Lobo, MD Consulting Physician Gastroenterology  08/25/15   Rana Snare, MD (Inactive) Consulting Physician Urology  08/25/15   Renelda Loma, OD  Optometry  08/25/15    Comment: "kimberly" at [facility]  Vickie Epley, MD Consulting Physician Cardiology  08/06/21     Chief Complaint  Patient presents with   Hypertension    Cmc; pt is fasting     Subjective:Jennifer House is a 76 y.o. female present for follow-up on multiple chronic medical conditions Hypertension/hyperlipidemia/hypertriglyceridemia/A. fib: Pt reports compliance with Benzapril 30 mg, amlodipine 10 mg, metoprolol 25 mg twice a day, Lasix 20 mg (MWF) today. .rhyp Pt is on warfarin therapy per cardiology. Pt is prescribed Zetia and tolerating statin.  Diet: Low-sodium Exercise: Tries to exercise  RF: Hypertension, hyperlipidemia, family history of heart disease and stroke, personal history A. Fib, obesity, former smoker  Depression/insomnia: Patient reports compliance with Paxil 20 mg, Lexapro 20 mg and trazodone 100 mg .  She feels this regimen is working well for her.  Her husband passed away Dec 28, 2020 and her daughter has been helping her/emotional support.      Encounter for chronic pain/lumbar arthritis: tramadol not effective.  Vicodin is working well for her and helping her remain active.  She does not routinely take the daytime dose, but does take a few times a week.  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 Pain contract signed (Y/N): Y Date narcotic database last reviewed (include red flags): 02/27/22       02/27/2022    8:35 AM 09/26/2021    8:06 AM 04/25/2021   10:27 AM 01/03/2021    1:42 PM 09/15/2020    8:29 AM  Depression screen PHQ 2/9  Decreased Interest 1 0 0 0 1  Down, Depressed, Hopeless 0 0 0 0 1  PHQ - 2 Score 1 0 0 0 2  Altered sleeping 1  0  0  Tired, decreased energy 0  0  1  Change in appetite 0  0  0  Feeling bad or failure about yourself  0  0  0  Trouble concentrating 0  0  0  Moving slowly or fidgety/restless 0  0  0  Suicidal thoughts 0  0  0  PHQ-9 Score 2  0  3    Allergies  Allergen Reactions   Ibandronate Sodium Other (See Comments)    Bones hurt   Pravachol [Pravastatin Sodium] Other (See Comments)    Myalgias    Nsaids     anticoagulated   Ultram [Tramadol Hcl] Nausea Only   Social History   Social History Narrative   Lives in [place], with husband [place].   Has 4 adult children , highest level of education is seventh grade.   Former smoker, denies recreational drugs or alcohol use.   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 Risa Grill   Closed displaced fracture of left femoral neck ([place]) 08/28/2017   Colon polyps    Depression    GERD (gastroesophageal reflux disease)    Headache(784.0)    hx of migraines   Hematuria    hx   HLD (hyperlipidemia)    mixed   HTN (hypertension)    Hypothyroidism    Insomnia    Liver function test abnormality    hx fatty liver   Lobar pneumonia ([place]) 10/19/2020    Medicare annual wellness visit, subsequent 08/25/2015   Murmur, cardiac    Nonalcoholic fatty liver disease    Obesity    Osteoarthritis    low back pain with left sciatica   Osteopenia    dexa 2013   Renal calculus    hx   RENAL CALCULUS, HX OF 01/31/2009   Qualifier: Diagnosis of  By: Selena Batten CMA, Jew[place]     Restless leg syndrome    Past Surgical History:  Procedure Laterality Date   APPENDECTOMY     CARDIAC CATHETERIZATION  1/12   revealed normal cors and LV function   COLONOSCOPY     cystocopy  10/17/03   left stent placement, TURBT   FOOT SURGERY     LUMBAR LAMINECTOMY/DECOMPRESSION MICRODISCECTOMY N/A 01/19/2013   Procedure: LUMBAR THREE FOUR LUMBAR LAMINECTOMY/DECOMPRESSION MICRODISCECTOMY 1 LEVEL;  Surgeon: Faythe Ghee, MD;  Location: [facility];  Service: Neurosurgery;  Laterality: N/A;   TOTAL ABDOMINAL HYSTERECTOMY  1985   TOTAL HIP ARTHROPLASTY Left 08/28/2017   Procedure: TOTAL HIP ARTHROPLASTY ANTERIOR APPROACH;  Surgeon: Rod Can, MD;  Location: [facility];  Service: Orthopedics;  Laterality: Left;   Family History  Problem Relation Age of Onset   Heart failure Mother    Heart attack Mother    Hypertension Mother    Thyroid cancer Mother    Stroke Father    Hypertension Father    Stroke Brother    Breast cancer Neg Hx    Colon cancer Neg Hx    Allergies as of 02/27/2022       Reactions   Ibandronate Sodium Other (See Comments)   Bones hurt   Pravachol [pravastatin Sodium] Other (See Comments)   Myalgias   Nsaids    anticoagulated   Ultram [tramadol Hcl] Nausea Only        Medication List        Accurate as of February 27, 2022  8:46 AM. If you have any questions, ask your nurse or doctor.          amLODipine 10 MG tablet Commonly known as: NORVASC Take 1 tablet (10 mg total) by mouth daily.   atorvastatin 20 MG tablet Commonly known as: LIPITOR Take 1 tablet (20 mg total) by mouth daily.   benazepril 20 MG tablet Commonly known  as: LOTENSIN TAKE 1 AND 1/2 TABLETS(30 MG) BY MOUTH DAILY   calcium carbonate 1500 (600 Ca) MG Tabs tablet Commonly known as: OSCAL Take 3 tablets by mouth daily.   Centrum Silver tablet Take 1 tablet by mouth daily.  cholecalciferol 1000 units tablet Commonly known as: VITAMIN D Take 1,000 Units by mouth daily.   cyanocobalamin 1000 MCG tablet Commonly known as: VITAMIN B12 Take 1,000 mcg by mouth daily.   escitalopram 20 MG tablet Commonly known as: LEXAPRO Take 1 tablet (20 mg total) by mouth daily.   ezetimibe 10 MG tablet Commonly known as: ZETIA TAKE 1 TABLET(10 MG) BY MOUTH DAILY   furosemide 20 MG tablet Commonly known as: LASIX TAKE 1 TABLET BY MOUTH EVERY MONDAY, WEDNESDAY AND FRIDAY   gabapentin 100 MG capsule Commonly known as: NEURONTIN 1 cap in morning and 2 caps at night   HYDROcodone-acetaminophen 5-325 MG tablet Commonly known as: NORCO/VICODIN Take 1 tablet by mouth 2 (two) times daily as needed for moderate pain.   levothyroxine 75 MCG tablet Commonly known as: SYNTHROID TAKE 1 TABLET(75 MCG) BY MOUTH DAILY. Monday- Saturday. 1.5 tabs on Sunday.   metoprolol tartrate 50 MG tablet Commonly known as: LOPRESSOR Take 0.5 tablets (25 mg total) by mouth 2 (two) times daily.   nitroGLYCERIN 0.4 MG SL tablet Commonly known as: NITROSTAT PLACE 1 TABLET UNDER THE TONGUE EVERY 5 MINUTES AS NEEDED FOR CHEST PAIN,(MAX 3 TABLETS)   PARoxetine 20 MG tablet Commonly known as: PAXIL TAKE 1 TABLET(20 MG) BY MOUTH DAILY   potassium chloride SA 20 MEQ tablet Commonly known as: KLOR-CON M 1 tab p.o. on days taking Lasix   traZODone 100 MG tablet Commonly known as: DESYREL TAKE 1 TABLET(100 MG) BY MOUTH AT BEDTIME   VITAMIN C PO Take by mouth.   warfarin 5 MG tablet Commonly known as: COUMADIN Take as directed by the anticoagulation clinic. If you are unsure how to take this medication, talk to your nurse or doctor. Original instructions: TAKE 1  TABLET BY MOUTH DAILY AS DIRECTED BY COUMADIN CLINIC        All past medical history, surgical history, allergies, family history, immunizations andmedications were updated in the EMR today and reviewed under the history and medication portions of their EMR.     ROS: Negative, with the exception of above mentioned in HPI   Objective:  BP 129/76   Pulse 64   Temp (!) 97.4 F (36.3 C) (Oral)   Ht _0  (1.6 m)   Wt 204 lb (92.5 kg)   SpO2 95%   BMI 36.14 kg/m  Body mass index is 36.14 kg/m. Physical Exam Vitals and nursing note reviewed.  Constitutional:      General: She is not in acute distress.    Appearance: Normal appearance. She is not ill-appearing, toxic-appearing or diaphoretic.  HENT:     Head: Normocephalic and atraumatic.     Mouth/Throat:     Mouth: Mucous membranes are moist.  Eyes:     General: No scleral icterus.       Right eye: No discharge.        Left eye: No discharge.     Extraocular Movements: Extraocular movements intact.     Conjunctiva/sclera: Conjunctivae normal.     Pupils: Pupils are equal, round, and reactive to light.  Cardiovascular:     Rate and Rhythm: Normal rate and regular rhythm.     Heart sounds: No murmur heard. Pulmonary:     Effort: Pulmonary effort is normal. No respiratory distress.     Breath sounds: Normal breath sounds. No wheezing, rhonchi or rales.  Musculoskeletal:     Right lower leg: No edema.     Left lower leg: No edema.  Skin:    General: Skin is warm and dry.     Coloration: Skin is not jaundiced or pale.     Findings: No erythema or rash.  Neurological:     Mental Status: She is alert and oriented to person, place, and time. Mental status is at baseline.     Motor: No weakness.     Gait: Gait normal.  Psychiatric:        Mood and Affect: Mood normal.        Behavior: Behavior normal.        Thought Content: Thought content normal.        Judgment: Judgment normal.      Assessment/Plan: Jennifer House is a 76 y.o. female present for OV for  Arthritis, lumbar spine/Osteoarthritis of thoracic spine, unspecified spinal osteoarthritis complication status/Spondylolisthesis at L3-L4 level/Encounter for chronic pain/morbid obesity Stable Medication has increased her quality of life and is able to keep her active.  - Chronic pain script: continue  hydrocodone 5-325 mg BID PRn  #180 supplied.  - UDS-collected today - contract signed  - [place] controlled substance database reviewed 02/27/22 -Continue gabapentin 100 mg 1 tab morning and 2 tabs before bed. - she is tolerating.  - Continue Vicodin 5-3 25 twice daily as needed - f/u after 6 months if needing refills.  Essential hypertension, benign/hyperlipidemia/A. Fib/ obesity/Atrial fibrillation, unspecified type (HCC)/Aortic atherosclerosis (HCC)/acquired thrombophilia/morbid obesity Stable Continue benzapril to 30 mg daily Continue amlodipine 10  Continue e Lasix MWF Continue metoprolol 25 BID  Continue Zetia 10 mg daily Continue statin- tolerating.  - Continue follow-up with cardiology for atrial fibrillation and coumadin management - low salt diet, increase exercise.  CBC, CMP, TSH and lipids collected today -Follow up 5.5 months . Hypothyroid: She has been instructed to continue her levothyroxine 1 tab daily except for on Sunday she was to take 1.5 tabs.   Continue levothyroxine 75 mcg daily, with an extra half a tablet on Sundays. TSH collected today   Hypocalcemia/Osteopenia, unspecified location/hyperparathyroidism - takes vit d 1000u daily. Last dexa 2013 - under the care of  endocrine.  -Vitamin D collected today  Insomnia, unspecified type/ Moderate episode of recurrent major depressive disorder (HCC) Stable Continue trazodone 100 mg nightly Continue lexapro 20 mg daily Continue Paxil to 20 mg daily -5.5 months  Return in about 24 weeks (around 08/14/2022).  Reviewed expectations re: course of  current medical issues. Discussed self-management of symptoms. Outlined signs and symptoms indicating need for more acute intervention. Patient verbalized understanding and all questions were answered. Patient received an After-Visit Summary.   Orders Placed This Encounter  Procedures   Flu Vaccine QUAD High Dose(Fluad)   CBC w/Diff   Comp Met (CMET)   TSH   Lipid panel   Vitamin D (25 hydroxy)   Hemoglobin A1c   Drug Monitoring Panel [phone] , Urine    Meds ordered this encounter  Medications   amLODipine (NORVASC) 10 MG tablet    Sig: Take 1 tablet (10 mg total) by mouth daily.    Dispense:  90 tablet    Refill:  1   atorvastatin (LIPITOR) 20 MG tablet    Sig: Take 1 tablet (20 mg total) by mouth daily.    Dispense:  90 tablet    Refill:  3   benazepril (LOTENSIN) 20 MG tablet    Sig: TAKE 1 AND 1/2 TABLETS(30 MG) BY MOUTH DAILY    Dispense:  135 tablet  Refill:  1   escitalopram (LEXAPRO) 20 MG tablet    Sig: Take 1 tablet (20 mg total) by mouth daily.    Dispense:  90 tablet    Refill:  1   ezetimibe (ZETIA) 10 MG tablet    Sig: TAKE 1 TABLET(10 MG) BY MOUTH DAILY    Dispense:  90 tablet    Refill:  3   furosemide (LASIX) 20 MG tablet    Sig: TAKE 1 TABLET BY MOUTH EVERY MONDAY, WEDNESDAY AND FRIDAY    Dispense:  45 tablet    Refill:  1   gabapentin (NEURONTIN) 100 MG capsule    Sig: 1 cap in morning and 2 caps at night    Dispense:  270 capsule    Refill:  5   metoprolol tartrate (LOPRESSOR) 50 MG tablet    Sig: Take 0.5 tablets (25 mg total) by mouth 2 (two) times daily.    Dispense:  90 tablet    Refill:  1   PARoxetine (PAXIL) 20 MG tablet    Sig: TAKE 1 TABLET(20 MG) BY MOUTH DAILY    Dispense:  90 tablet    Refill:  1   potassium chloride SA (KLOR-CON M) 20 MEQ tablet    Sig: 1 tab p.o. on days taking Lasix    Dispense:  30 tablet    Refill:  5   traZODone (DESYREL) 100 MG tablet    Sig: TAKE 1 TABLET(100 MG) BY MOUTH AT BEDTIME    Dispense:   90 tablet    Refill:  1   HYDROcodone-acetaminophen (NORCO/VICODIN) 5-325 MG tablet    Sig: Take 1 tablet by mouth 2 (two) times daily as needed for moderate pain.    Dispense:  180 tablet    Refill:  0      Note is dictated utilizing voice recognition software. Although note has been proof read prior to signing, occasional typographical errors still can be missed. If any questions arise, please do not hesitate to call for verification.   electronically signed by:  Howard Pouch, DO  [place]

## 2022-02-27 NOTE — Patient Instructions (Addendum)
Return in about 24 weeks (around 08/14/2022).        Great to see you today.  I have refilled the medication(s) we provide.   If labs were collected, we will inform you of lab results once received either by echart message or telephone call.   - echart message- for normal results that have been seen by the patient already.   - telephone call: abnormal results or if patient has not viewed results in their echart.

## 2022-02-28 NOTE — Addendum Note (Signed)
Addended by: Kavin Leech on: 02/28/2022 09:28 AM   Modules accepted: Orders

## 2022-03-01 ENCOUNTER — Other Ambulatory Visit (INDEPENDENT_AMBULATORY_CARE_PROVIDER_SITE_OTHER): Payer: Medicare Other

## 2022-03-01 DIAGNOSIS — M8580 Other specified disorders of bone density and structure, unspecified site: Secondary | ICD-10-CM | POA: Diagnosis not present

## 2022-03-01 DIAGNOSIS — E559 Vitamin D deficiency, unspecified: Secondary | ICD-10-CM | POA: Diagnosis not present

## 2022-03-01 DIAGNOSIS — Z79899 Other long term (current) drug therapy: Secondary | ICD-10-CM | POA: Diagnosis not present

## 2022-03-01 DIAGNOSIS — E213 Hyperparathyroidism, unspecified: Secondary | ICD-10-CM

## 2022-03-01 DIAGNOSIS — I10 Essential (primary) hypertension: Secondary | ICD-10-CM | POA: Diagnosis not present

## 2022-03-01 LAB — COMPREHENSIVE METABOLIC PANEL
ALT: 10 U/L (ref 0–35)
AST: 11 U/L (ref 0–37)
Albumin: 4.2 g/dL (ref 3.5–5.2)
Alkaline Phosphatase: 71 U/L (ref 39–117)
BUN: 15 mg/dL (ref 6–23)
CO2: 29 mEq/L (ref 19–32)
Calcium: 9.7 mg/dL (ref 8.4–10.5)
Chloride: 103 mEq/L (ref 96–112)
Creatinine, Ser: 0.93 mg/dL (ref 0.40–1.20)
GFR: 58.87 mL/min — ABNORMAL LOW (ref >60.00)
Glucose, Bld: 103 mg/dL — ABNORMAL HIGH (ref 70–99)
Potassium: 4.2 mEq/L (ref 3.5–5.1)
Sodium: 140 mEq/L (ref 135–145)
Total Bilirubin: 0.5 mg/dL (ref 0.2–1.2)
Total Protein: 6.7 g/dL (ref 6.0–8.3)

## 2022-03-01 LAB — CBC WITH DIFFERENTIAL/PLATELET
Basophils Absolute: 0 10*3/uL (ref 0.0–0.1)
Basophils Relative: 0.5 % (ref 0.0–3.0)
Eosinophils Absolute: 0.3 10*3/uL (ref 0.0–0.7)
Eosinophils Relative: 5.5 % — ABNORMAL HIGH (ref 0.0–5.0)
HCT: 40.7 % (ref 36.0–46.0)
Hemoglobin: 13.2 g/dL (ref 12.0–15.0)
Lymphocytes Relative: 25.9 % (ref 12.0–46.0)
Lymphs Abs: 1.4 10*3/uL (ref 0.7–4.0)
MCHC: 32.5 g/dL (ref 30.0–36.0)
MCV: 81 fl (ref 78.0–100.0)
Monocytes Absolute: 0.4 10*3/uL (ref 0.1–1.0)
Monocytes Relative: 8 % (ref 3.0–12.0)
Neutro Abs: 3.3 10*3/uL (ref 1.4–7.7)
Neutrophils Relative %: 60.1 % (ref 43.0–77.0)
Platelets: 190 10*3/uL (ref 150.0–400.0)
RBC: 5.03 Mil/uL (ref 3.87–5.11)
RDW: 14.6 % (ref 11.5–15.5)
WBC: 5.5 10*3/uL (ref 4.0–10.5)

## 2022-03-01 LAB — TSH: TSH: 1.67 u[IU]/mL (ref 0.35–5.50)

## 2022-03-01 LAB — HEMOGLOBIN A1C: Hgb A1c MFr Bld: 6.2 % (ref 4.6–6.5)

## 2022-03-01 LAB — LIPID PANEL
Cholesterol: 117 mg/dL (ref 0–200)
HDL: 50.1 mg/dL (ref >39.00)
LDL Cholesterol: 42 mg/dL (ref 0–99)
NonHDL: 66.8
Total CHOL/HDL Ratio: 2
Triglycerides: 123 mg/dL (ref 0.0–149.0)
VLDL: 24.6 mg/dL (ref 0.0–40.0)

## 2022-03-01 LAB — VITAMIN D 25 HYDROXY (VIT D DEFICIENCY, FRACTURES): VITD: 53.63 ng/mL (ref 30.00–100.00)

## 2022-03-01 NOTE — Addendum Note (Signed)
Addended by: Kavin Leech on: 03/01/2022 09:12 AM   Modules accepted: Orders

## 2022-03-02 LAB — DRUG MONITORING PANEL 376104, URINE
Amphetamines: NEGATIVE ng/mL
Barbiturates: NEGATIVE ng/mL
Benzodiazepines: NEGATIVE ng/mL
Cocaine Metabolite: NEGATIVE ng/mL
Codeine: NEGATIVE ng/mL
Desmethyltramadol: NEGATIVE ng/mL
Hydrocodone: NEGATIVE ng/mL
Hydromorphone: NEGATIVE ng/mL
Morphine: NEGATIVE ng/mL
Norhydrocodone: NEGATIVE ng/mL
Opiates: NEGATIVE ng/mL
Oxycodone: NEGATIVE ng/mL
Tramadol: NEGATIVE ng/mL

## 2022-03-02 LAB — DM TEMPLATE

## 2022-03-04 ENCOUNTER — Other Ambulatory Visit: Payer: Self-pay | Admitting: Family Medicine

## 2022-03-04 MED ORDER — LEVOTHYROXINE SODIUM 75 MCG PO TABS: ORAL_TABLET | ORAL | 3 refills | Status: DC

## 2022-03-12 ENCOUNTER — Ambulatory Visit (INDEPENDENT_AMBULATORY_CARE_PROVIDER_SITE_OTHER): Payer: Medicare Other

## 2022-03-12 ENCOUNTER — Ambulatory Visit: Payer: Medicare Other | Attending: Cardiovascular Disease

## 2022-03-12 ENCOUNTER — Telehealth: Payer: Self-pay | Admitting: Cardiovascular Disease

## 2022-03-12 DIAGNOSIS — Z5181 Encounter for therapeutic drug level monitoring: Secondary | ICD-10-CM | POA: Diagnosis not present

## 2022-03-12 DIAGNOSIS — Z0000 Encounter for general adult medical examination without abnormal findings: Secondary | ICD-10-CM | POA: Diagnosis not present

## 2022-03-12 DIAGNOSIS — I4891 Unspecified atrial fibrillation: Secondary | ICD-10-CM | POA: Diagnosis not present

## 2022-03-12 LAB — POCT INR: INR: 2.2 (ref 2.0–3.0)

## 2022-03-12 NOTE — Progress Notes (Signed)
Subjective:   Jennifer House is a 76 y.o. female who presents for Medicare Annual (Subsequent) preventive examination.  I connected with  [name] on 03/12/22 by a video enabled telemedicine application and verified that I am speaking with the correct person using two identifiers.   I discussed the limitations of evaluation and management by telemedicine. The patient expressed understanding and agreed to proceed.  Review of Systems    Defer to PCP Cardiac Risk Factors include: advanced age (>85mn, >>59women);hypertension     Objective:    There were no vitals filed for this visit. There is no height or weight on file to calculate BMI.     03/12/2022   10:14 AM 10/12/2020    9:59 AM 12/29/2019    2:20 PM 08/28/2017   12:49 PM 09/19/2016    9:54 AM 01/19/2013   10:37 AM 01/18/2013   12:21 PM  Advanced Directives  Does Patient Have a Medical Advance Directive? Yes No No No No Patient does not have advance directive Patient does not have advance directive;Patient would like information  Type of Advance Directive Living will        Does patient want to make changes to medical advance directive? No - Patient declined        Would patient like information on creating a medical advance directive?   Yes (MAU/Ambulatory/Procedural Areas - Information given) No - Patient declined Yes (MAU/Ambulatory/Procedural Areas - Information given) Advance directive packet given Advance directive packet given  Pre-existing out of facility DNR order (yellow form or pink MOST form)      No     Current Medications (verified) Outpatient Encounter Medications as of 03/12/2022  Medication Sig   amLODipine (NORVASC) 10 MG tablet Take 1 tablet (10 mg total) by mouth daily.   Ascorbic Acid (VITAMIN C PO) Take by mouth.   atorvastatin (LIPITOR) 20 MG tablet Take 1 tablet (20 mg total) by mouth daily.   benazepril (LOTENSIN) 20 MG tablet TAKE 1 AND 1/2 TABLETS(30 MG) BY MOUTH DAILY   calcium  carbonate (OSCAL) 1500 (600 Ca) MG TABS tablet Take 3 tablets by mouth daily.   cholecalciferol (VITAMIN D) 1000 UNITS tablet Take 1,000 Units by mouth daily.   escitalopram (LEXAPRO) 20 MG tablet Take 1 tablet (20 mg total) by mouth daily.   ezetimibe (ZETIA) 10 MG tablet TAKE 1 TABLET(10 MG) BY MOUTH DAILY   furosemide (LASIX) 20 MG tablet TAKE 1 TABLET BY MOUTH EVERY MONDAY, WEDNESDAY AND FRIDAY   gabapentin (NEURONTIN) 100 MG capsule 1 cap in morning and 2 caps at night   HYDROcodone-acetaminophen (NORCO/VICODIN) 5-325 MG tablet Take 1 tablet by mouth 2 (two) times daily as needed for moderate pain.   levothyroxine (SYNTHROID) 75 MCG tablet TAKE 1 TABLET(75 MCG) BY MOUTH DAILY. Monday- Saturday. 1.5 tabs on Sunday.   metoprolol tartrate (LOPRESSOR) 50 MG tablet Take 0.5 tablets (25 mg total) by mouth 2 (two) times daily.   Multiple Vitamins-Minerals (CENTRUM SILVER) tablet Take 1 tablet by mouth daily.   nitroGLYCERIN (NITROSTAT) 0.4 MG SL tablet PLACE 1 TABLET UNDER THE TONGUE EVERY 5 MINUTES AS NEEDED FOR CHEST PAIN,(MAX 3 TABLETS)   PARoxetine (PAXIL) 20 MG tablet TAKE 1 TABLET(20 MG) BY MOUTH DAILY   potassium chloride SA (KLOR-CON M) 20 MEQ tablet 1 tab p.o. on days taking Lasix   traZODone (DESYREL) 100 MG tablet TAKE 1 TABLET(100 MG) BY MOUTH AT BEDTIME   vitamin B-12 (CYANOCOBALAMIN) 1000 MCG tablet Take  1,000 mcg by mouth daily.   warfarin (COUMADIN) 5 MG tablet TAKE 1 TABLET BY MOUTH DAILY AS DIRECTED BY COUMADIN CLINIC   No facility-administered encounter medications on file as of 03/12/2022.    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 Risa Grill   Closed displaced fracture of left femoral  neck ([place]) 08/28/2017   Colon polyps    Depression    GERD (gastroesophageal reflux disease)    Headache(784.0)    hx of migraines   Hematuria    hx   HLD (hyperlipidemia)    mixed   HTN (hypertension)    Hypothyroidism    Insomnia    Liver function test abnormality    hx fatty liver   Lobar pneumonia ([place]) 10/19/2020   Medicare annual wellness visit, subsequent 08/25/2015   Murmur, cardiac    Nonalcoholic fatty liver disease    Obesity    Osteoarthritis    low back pain with left sciatica   Osteopenia    dexa 2013   Renal calculus    hx   RENAL CALCULUS, HX OF 01/31/2009   Qualifier: Diagnosis of  By: Selena Batten CMA, [place]     Restless leg syndrome    Past Surgical History:  Procedure Laterality Date   APPENDECTOMY     CARDIAC CATHETERIZATION  1/12   revealed normal cors and LV function   COLONOSCOPY     cystocopy  10/17/03   left stent placement, TURBT   FOOT SURGERY     LUMBAR LAMINECTOMY/DECOMPRESSION MICRODISCECTOMY N/A 01/19/2013   Procedure: LUMBAR THREE FOUR LUMBAR LAMINECTOMY/DECOMPRESSION MICRODISCECTOMY 1 LEVEL;  Surgeon: Faythe Ghee, MD;  Location: [facility];  Service: Neurosurgery;  Laterality: N/A;   TOTAL ABDOMINAL HYSTERECTOMY  1985   TOTAL HIP ARTHROPLASTY Left 08/28/2017   Procedure: TOTAL HIP ARTHROPLASTY ANTERIOR APPROACH;  Surgeon: Rod Can, MD;  Location: [facility];  Service: Orthopedics;  Laterality: Left;   Family History  Problem Relation Age of Onset   Heart failure Mother    Heart attack Mother    Hypertension Mother    Thyroid cancer Mother    Stroke Father    Hypertension Father    Stroke Brother    Breast cancer Neg Hx    Colon cancer Neg Hx    Social History   Socioeconomic History   Marital status: Married    Spouse name: Not on file   Number of children: Not on file   Years of education: Not on file   Highest education level: Not on file  Occupational History   Not on file  Tobacco Use   Smoking status: Former    Types:  Cigarettes    Quit date: 03/18/1985    Years since quitting: 37.0   Smokeless tobacco: Never   Tobacco comments:    denies   Vaping Use   Vaping Use: Never used  Substance and Sexual Activity   Alcohol use: No   Drug use: No   Sexual activity: Never  Other Topics Concern   Not on file  Social History Narrative   Lives in [place], with husband Eddie Dibbles.   Has 4 adult children , highest level of education is seventh grade.   Former smoker,  denies recreational drugs or alcohol use.   Patient drink caffeine beverages, takes a daily vitamin   Patient was her seatbelt , she has dentures , she has a smoke detector in the home , there are guns in the home a  Locked case    Patient feels safe in her relationships       Social Determinants of Health   Financial Resource Strain: Low Risk  (03/12/2022)   Overall Financial Resource Strain (CARDIA)    Difficulty of Paying Living Expenses: Not hard at all  Food Insecurity: No Food Insecurity (03/12/2022)   Hunger Vital Sign    Worried About Running Out of Food in the Last Year: Never true    [place] in the Last Year: Never true  Transportation Needs: No Transportation Needs (03/12/2022)   PRAPARE - Hydrologist (Medical): No    Lack of Transportation (Non-Medical): No  Physical Activity: Insufficiently Active (03/12/2022)   Exercise Vital Sign    Days of Exercise per Week: 4 days    Minutes of Exercise per Session: 30 min  Stress: No Stress Concern Present (03/12/2022)   [place]    Feeling of Stress : Not at all  Social Connections: Socially Isolated (03/12/2022)   Social Connection and Isolation Panel [NHANES]    Frequency of Communication with Friends and Family: Three times a week    Frequency of Social Gatherings with Friends and Family: More than three times a week    Attends Religious Services: Never    Marine scientist  or Organizations: No    Attends Archivist Meetings: Never    Marital Status: Widowed    Tobacco Counseling Counseling given: Not Answered Tobacco comments: denies    Clinical Intake:  Pre-visit preparation completed: Yes  Pain : No/denies pain     Nutritional Risks: None Diabetes: No  How often do you need to have someone help you when you read instructions, pamphlets, or other written materials from your doctor or pharmacy?: 3 - Sometimes What is the last grade level you completed in school?: 7th  Diabetic? No  Interpreter Needed?: No  Information entered by :: Toria C., RMA   Activities of Daily Living    03/12/2022   10:12 AM  In your present state of health, do you have any difficulty performing the following activities:  Hearing? 0  Vision? 0  Difficulty concentrating or making decisions? 1  Walking or climbing stairs? 1  Dressing or bathing? 0  Doing errands, shopping? 0  Preparing Food and eating ? N  Using the Toilet? N  In the past six months, have you accidently leaked urine? N  Do you have problems with loss of bowel control? N  Managing your Medications? N  Managing your Finances? Y  Housekeeping or managing your Housekeeping? N    Patient Care Team: Ma Hillock, DO as PCP - General (Family Medicine) Thompson Grayer, MD as Attending Physician (Cardiology) Ronald Lobo, MD as Consulting Physician (Gastroenterology) Rana Snare, MD (Inactive) as Consulting Physician (Urology) Renelda Loma, OD (Optometry) Vickie Epley, MD as Consulting Physician (Cardiology)  Indicate any recent Medical Services you may have received from other than [facility] providers in the past year (date may be approximate).     Assessment:   This is a routine wellness examination for Jennifer House.  Hearing/Vision screen No results found.  Dietary issues and exercise activities  discussed: Current Exercise Habits: Home exercise routine, Time (Minutes): 30,  Frequency (Times/Week): 2, Weekly Exercise (Minutes/Week): 60   Goals Addressed   None   Depression Screen    02/27/2022    8:35 AM 09/26/2021    8:06 AM 04/25/2021   10:27 AM 01/03/2021    1:42 PM 09/15/2020    8:29 AM 05/10/2020   11:23 AM 01/13/2020   10:51 AM  PHQ 2/9 Scores  PHQ - 2 Score 1 0 0 0 2 0 1  PHQ- 9 Score 2  0  3  5    Fall Risk    02/27/2022    8:25 AM 01/03/2021    1:33 PM 12/29/2019    2:20 PM 06/02/2018    1:56 PM 01/12/2018    1:08 PM  [place] in the past year? 0 0 0 0 No  Number falls in past yr: 0 0 0    Injury with Fall? 0 0 0    Risk for fall due to : No Fall Risks   Medication side effect   Follow up Falls evaluation completed Falls evaluation completed;Falls prevention discussed Falls prevention discussed Education provided     FALL RISK PREVENTION PERTAINING TO THE HOME:  Any stairs in or around the home? No  If so, are there any without handrails? No  Home free of loose throw rugs in walkways, pet beds, electrical cords, etc? Yes  Adequate lighting in your home to reduce risk of falls? Yes   ASSISTIVE DEVICES UTILIZED TO PREVENT FALLS:  Life alert? No  Use of a cane, walker or w/c? No  Grab bars in the bathroom? Yes  Shower chair or bench in shower? Yes  Elevated toilet seat or a handicapped toilet? No   TIMED UP AND GO:  Was the test performed? No .   Cognitive Function:    09/19/2016    9:57 AM  MMSE - Mini Mental State Exam  Orientation to time 5  Orientation to Place 5  Registration 3  Attention/ Calculation 2  Recall 2  Language- name 2 objects 2  Language- repeat 1  Language- follow 3 step command 3  Language- read & follow direction 1  Write a sentence 0  Copy design 1  Total score 25        03/12/2022   10:15 AM  6CIT Screen  What Year? 4 points  What month? 3 points  What time? 0 points  Count back from 20 0 points  Months in reverse 0 points  Repeat phrase 10 points  Total Score 17 points     Immunizations Immunization History  Administered Date(s) Administered   Fluad Quad(high Dose 65+) 11/30/2018, 01/13/2020, 04/25/2021, 02/27/2022   Influenza, High Dose Seasonal PF 01/02/2017, 01/12/2018   Influenza,inj,Quad PF,6+ Mos 01/23/2015, 12/15/2015   Pneumococcal Conjugate-13 08/18/2014   Pneumococcal Polysaccharide-23 02/27/2009, 08/25/2015   Tdap 02/22/2015    TDAP status: Up to date  Flu Vaccine status: Up to date  Pneumococcal vaccine status: Up to date  Covid-19 vaccine status: Declined, Education has been provided regarding the importance of this vaccine but patient still declined. Advised may receive this vaccine at local pharmacy or Health Dept.or vaccine clinic. Aware to provide a copy of the vaccination record if obtained from local pharmacy or Health Dept. Verbalized acceptance and understanding.  Qualifies for Shingles Vaccine? Yes   Zostavax completed No   Shingrix Completed?: No.    Education has been provided regarding the importance  of this vaccine. Patient has been advised to call insurance company to determine out of pocket expense if they have not yet received this vaccine. Advised may also receive vaccine at local pharmacy or Health Dept. Verbalized acceptance and understanding.  Screening Tests Health Maintenance  Topic Date Due   Medicare Annual Wellness (AWV)  03/13/2023   DTaP/Tdap/Td (2 - Td or Tdap) 02/21/2025   Pneumonia Vaccine 70+ Years old  Completed   INFLUENZA VACCINE  Completed   DEXA SCAN  Completed   Hepatitis C Screening  Completed   HPV VACCINES  Aged Out   COLONOSCOPY (Pts 45-65yr Insurance coverage will need to be confirmed)  Discontinued   COVID-19 Vaccine  Discontinued   Zoster Vaccines- Shingrix  Discontinued    Health Maintenance  There are no preventive care reminders to display for this patient.   Colorectal cancer screening: No longer required.   Mammogram status: No longer required due to age.  Bone Density  status: Completed 12/05/2021. Results reflect: Bone density results: OSTEOPENIA. Repeat every 2 years.  Lung Cancer Screening: (Low Dose CT Chest recommended if Age 78-80years, 30 pack-year currently smoking OR have quit w/in 15years.) does not qualify.    Additional Screening:  Hepatitis C Screening: does qualify; Completed 08/25/2015  Vision Screening: Recommended annual ophthalmology exams for early detection of glaucoma and other disorders of the eye. Is the patient up to date with their annual eye exam?  No  Who is the provider or what is the name of the office in which the patient attends annual eye exams?  [facility]   Dental Screening: Recommended annual dental exams for proper oral hygiene  Community Resource Referral / Chronic Care Management: CRR required this visit?  No   CCM required this visit?  No      Plan:     I have personally reviewed and noted the following in the patient's chart:   Medical and social history Use of alcohol, tobacco or illicit drugs  Current medications and supplements including opioid prescriptions. Patient is not currently taking opioid prescriptions. Functional ability and status Nutritional status Physical activity Advanced directives List of other physicians Hospitalizations, surgeries, and ER visits in previous 12 months Vitals Screenings to include cognitive, depression, and falls Referrals and appointments  In addition, I have reviewed and discussed with patient certain preventive protocols, quality metrics, and best practice recommendations. A written personalized care plan for preventive services as well as general preventive health recommendations were provided to patient.     GKavin Leech [facility]  03/12/2022   Nurse Notes: Non face to face 15 minutes.  Ms. PLich, Thank you for taking time to come for your Medicare Wellness Visit. I appreciate your ongoing commitment to your health goals. Please review the following plan  we discussed and let me know if I can assist you in the future.   These are the goals we discussed:  Goals       <enter goal here> (pt-stated)      Maintain current health by staying active with gardening/canning.       Patient Stated      Maintain current health        This is a list of the screening recommended for you and due dates:  Health Maintenance  Topic Date Due   Medicare Annual Wellness Visit  03/13/2023   DTaP/Tdap/Td vaccine (2 - Td or Tdap) 02/21/2025   Pneumonia Vaccine  Completed   Flu Shot  Completed  DEXA scan (bone density measurement)  Completed   Hepatitis C Screening: USPSTF Recommendation to screen - Ages 4-79 yo.  Completed   HPV Vaccine  Aged Out   Colon Cancer Screening  Discontinued   COVID-19 Vaccine  Discontinued   Zoster (Shingles) Vaccine  Discontinued

## 2022-03-12 NOTE — Patient Instructions (Signed)
  Jennifer House , Thank you for taking time to come for your Medicare Wellness Visit. I appreciate your ongoing commitment to your health goals. Please review the following plan we discussed and let me know if I can assist you in the future.   These are the goals we discussed:  Goals       <enter goal here> (pt-stated)      Maintain current health by staying active with gardening/canning.       Patient Stated      Maintain current health        This is a list of the screening recommended for you and due dates:  Health Maintenance  Topic Date Due   Medicare Annual Wellness Visit  03/13/2023   DTaP/Tdap/Td vaccine (2 - Td or Tdap) 02/21/2025   Pneumonia Vaccine  Completed   Flu Shot  Completed   DEXA scan (bone density measurement)  Completed   Hepatitis C Screening: USPSTF Recommendation to screen - Ages 58-79 yo.  Completed   HPV Vaccine  Aged Out   Colon Cancer Screening  Discontinued   COVID-19 Vaccine  Discontinued   Zoster (Shingles) Vaccine  Discontinued

## 2022-03-12 NOTE — Patient Instructions (Signed)
Continue taking 1 tablet everyday except 1/2 tablet on Sundays. Stay consistent with greens (2-3 per week). Recheck INR in 6 weeks.  [phone] or [phone] Coumadin clinic. Main # [phone].

## 2022-04-22 ENCOUNTER — Other Ambulatory Visit: Payer: Self-pay | Admitting: Physician Assistant

## 2022-04-22 NOTE — Telephone Encounter (Signed)
Refill request for warfarin:  Last INR was 2.2 on 03/12/22 Next INR due 04/23/22 LOV was 07/16/21  Jens Som PA-C  Refill approved.

## 2022-04-23 ENCOUNTER — Ambulatory Visit: Payer: Medicare Other | Attending: Internal Medicine

## 2022-04-23 DIAGNOSIS — I4891 Unspecified atrial fibrillation: Secondary | ICD-10-CM

## 2022-04-23 DIAGNOSIS — Z5181 Encounter for therapeutic drug level monitoring: Secondary | ICD-10-CM | POA: Diagnosis not present

## 2022-04-23 LAB — POCT INR: INR: 1.4 — AB (ref 2.0–3.0)

## 2022-04-23 NOTE — Patient Instructions (Signed)
TAKE 2 TABLETS TODAY AND 1.5 TABLETS WEDNESDAY THEN Continue taking 1 tablet everyday except 1/2 tablet on Sundays. Stay consistent with greens (2-3 per week). Recheck INR in 2 weeks.  [phone] or [phone] Coumadin clinic. Main # [phone].

## 2022-05-06 ENCOUNTER — Ambulatory Visit: Payer: Medicare Other | Attending: Cardiology | Admitting: *Deleted

## 2022-05-06 DIAGNOSIS — Z5181 Encounter for therapeutic drug level monitoring: Secondary | ICD-10-CM

## 2022-05-06 DIAGNOSIS — I4891 Unspecified atrial fibrillation: Secondary | ICD-10-CM

## 2022-05-06 LAB — POCT INR: INR: 3.9 — AB (ref 2.0–3.0)

## 2022-05-06 NOTE — Patient Instructions (Signed)
Description   Do not take any warfarin today then continue taking 1 tablet everyday except 1/2 tablet on Sundays. Stay consistent with greens (2-3 per week). Recheck INR in 2 weeks.  [phone] or [phone] Coumadin clinic. Main # [phone].

## 2022-05-12 NOTE — Progress Notes (Unsigned)
Cardiology Office Note Date:  07/16/2021  Patient ID:  Jennifer, House February 07, 1946, MRN [id number] PCP:  Ma Hillock, DO  Electrophysiologist: Dr. Rayann Heman    Chief Complaint:  6 mo, overdue  History of Present Illness: Jennifer House is a 77 y.o. female with history of HTN, HLD, hypothyroidism, obesity, and permanent Afib  She comes today to be seen for Dr. Rayann Heman, last seen by him via tele health 08/31/2018, at that time doing OK, no changes were made, planned for annual visit  She had an ER visit 10/12/20 after a fall, seems without significant injuray and discharged from the ER.  There was suspect degree of dementia with poor short term memory, though son denied this as a known dx.  Was found with a pneumonia, EKg was rate controlled AF Saw her PMD 10/19/20 with ongoing management of her pneumonia I do not see a specific discussion regarding dementia or not  I saw her 12/18/20 She comes today accompanied by her daughter. She is doing well, recovered from her UTI/pneumonia.  No trouble with falls, they report she is steady on her feet.  No CP, palpitations or cardiac awareness. No dizzy spells, near syncope or syncope. No bleeding or signs of bleeding Warfarin is managed here, her PMD manages and monitors her labs otherwise She was in rate controlled AFib without symptoms No changes were made, thoughts towards perhaps updating her echo next visit if needed/indicated Lipids were managed with her PMD Planned for an annual visit  I saw her 07/16/21: She is accompanied by her grand daughter today Her husband died 35 mo ago, since then her grand daughter has noticed she is not as active, not using her stationary bike the way she used to, less engaged perhaps. They have a trip coming up to [place] that she is looking forward to. The patient does say that she misses her husband very much and since his death, not as active, though not for any particular reason otherwise. Her grand  daughter noticed 2 weeks ago she was quite swollen, especially her R foot/ankle though was on both sides. A week or so ago when shopping she felt like her legs were weak. The patiient is clear though she was not SOB, is not having any CP, palpitations. But her legs feel more weak. NO near syncope or syncope No bleeding or signs of bleeding INR today 3.8 Edema has nearly resolved without any particular intervention No extra lasix uses. And no particular trigger for the edema, dietary changes, no missed meds. Suspected symptoms 2/2 sedentary lifestyle Planned to update her echo, prn lasix, labs,  encouraged activity, to f/u 4 mo  Echo with LVEF 55-60%, no WMA  TODAY She is accompanied by her grand daughter She is doing well Denies any CP, palpitations, SOB, DOE. Remains mostly sedentary, runs some errands, some house work, no exercise No dizzy spells, near syncope or syncope. 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 Risa Grill   Closed displaced fracture of left femoral neck ([place]) 08/28/2017   Colon polyps    Depression    GERD (gastroesophageal reflux disease)    Headache(784.0)    hx of migraines   Hematuria    hx   HLD (hyperlipidemia)  mixed   HTN (hypertension)    Hypothyroidism    Insomnia    Liver function test abnormality    hx fatty liver   Lobar pneumonia ([place]) 10/19/2020   Medicare annual wellness visit, subsequent 08/25/2015   Murmur, cardiac    Nonalcoholic fatty liver disease    Obesity    Osteoarthritis    low back pain with left sciatica   Osteopenia    dexa 2013   Renal calculus    hx   RENAL CALCULUS, HX OF 01/31/2009   Qualifier: Diagnosis of  By: Selena Batten CMA, [place]     Restless leg syndrome     Past Surgical History:  Procedure  Laterality Date   APPENDECTOMY     CARDIAC CATHETERIZATION  1/12   revealed normal cors and LV function   COLONOSCOPY     cystocopy  10/17/03   left stent placement, TURBT   FOOT SURGERY     LUMBAR LAMINECTOMY/DECOMPRESSION MICRODISCECTOMY N/A 01/19/2013   Procedure: LUMBAR THREE FOUR LUMBAR LAMINECTOMY/DECOMPRESSION MICRODISCECTOMY 1 LEVEL;  Surgeon: Faythe Ghee, MD;  Location: [facility];  Service: Neurosurgery;  Laterality: N/A;   TOTAL ABDOMINAL HYSTERECTOMY  1985   TOTAL HIP ARTHROPLASTY Left 08/28/2017   Procedure: TOTAL HIP ARTHROPLASTY ANTERIOR APPROACH;  Surgeon: Rod Can, MD;  Location: [facility];  Service: Orthopedics;  Laterality: Left;    Current Outpatient Medications  Medication Sig Dispense Refill   amLODipine (NORVASC) 10 MG tablet Take 1 tablet (10 mg total) by mouth daily. 90 tablet 1   atorvastatin (LIPITOR) 20 MG tablet Take 1 tablet (20 mg total) by mouth daily. 90 tablet 3   benazepril (LOTENSIN) 20 MG tablet TAKE 1 AND 1/2 TABLETS(30 MG) BY MOUTH DAILY 135 tablet 1   calcium carbonate (OSCAL) 1500 (600 Ca) MG TABS tablet Take 3 tablets by mouth daily.     cholecalciferol (VITAMIN D) 1000 UNITS tablet Take 1,000 Units by mouth daily.     escitalopram (LEXAPRO) 20 MG tablet Take 1 tablet (20 mg total) by mouth daily. 90 tablet 1   ezetimibe (ZETIA) 10 MG tablet TAKE 1 TABLET(10 MG) BY MOUTH DAILY 90 tablet 3   furosemide (LASIX) 20 MG tablet TAKE 1 TABLET BY MOUTH EVERY MONDAY, WEDNESDAY AND FRIDAY 45 tablet 3   gabapentin (NEURONTIN) 100 MG capsule 1 cap in morning and 2 caps at night 270 capsule 5   HYDROcodone-acetaminophen (NORCO/VICODIN) 5-325 MG tablet Take 1 tablet by mouth 2 (two) times daily as needed for moderate pain. 180 tablet 0   levothyroxine (SYNTHROID) 75 MCG tablet TAKE 1 TABLET(75 MCG) BY MOUTH DAILY. Monday- Saturday. 1.5 tabs on Sunday. 96 tablet 3   metoprolol tartrate (LOPRESSOR) 50 MG tablet Take 0.5 tablets (25 mg total) by mouth 2 (two)  times daily. 180 tablet 1   Multiple Vitamins-Minerals (CENTRUM SILVER) tablet Take 1 tablet by mouth daily.     nitroGLYCERIN (NITROSTAT) 0.4 MG SL tablet PLACE 1 TABLET UNDER THE TONGUE EVERY 5 MINUTES AS NEEDED FOR CHEST PAIN,(MAX 3 TABLETS) 25 tablet 0   PARoxetine (PAXIL) 20 MG tablet TAKE 1 TABLET(20 MG) BY MOUTH DAILY 90 tablet 1   potassium chloride SA (KLOR-CON M) 20 MEQ tablet 1 tab p.o. on days taking Lasix 30 tablet 5   traZODone (DESYREL) 100 MG tablet TAKE 1 TABLET(100 MG) BY MOUTH AT BEDTIME 90 tablet 1   vitamin B-12 (CYANOCOBALAMIN) 1000 MCG tablet Take 1,000 mcg by mouth daily.     warfarin (COUMADIN) 5  MG tablet TAKE 1 TABLET DAILY AS DIRECTED BY COUMADIN CLINIC 90 tablet 1   No current facility-administered medications for this visit.    Allergies:   Ibandronate sodium, Pravachol [pravastatin sodium], Nsaids, and Ultram [tramadol hcl]   Social History:  The patient  reports that she quit smoking about 36 years ago. Her smoking use included cigarettes. She has never used smokeless tobacco. She reports that she does not drink alcohol and does not use drugs.   Family History:  The patient's family history includes Heart attack in her mother; Heart failure in her mother; Hypertension in her father and mother; Stroke in her brother and father; Thyroid cancer in her mother.  ROS:  Please see the history of present illness.    All other systems are reviewed and otherwise negative.   PHYSICAL EXAM:  VS:  BP 118/66   Pulse (!) 56   Ht '5\' 3"'$  (1.6 m)   Wt 200 lb (90.7 kg)   SpO2 96%   BMI 35.43 kg/m  BMI: Body mass index is 35.43 kg/m. Well nourished, well developed, in no acute distress HEENT: normocephalic, atraumatic Neck: no JVD, carotid bruits or masses Cardiac:  irreg-irreg; no significant murmurs, no rubs, or gallops Lungs:  CTA b/l, no wheezing, rhonchi or rales Abd: soft, nontender MS: no deformity or atrophy Ext: no edema R, none otherwise Skin: warm and dry,  no rash Neuro:  No gross deficits appreciated Psych: euthymic mood, full affect    EKG: done today and reviewed by myself AFib 61bpm, appears similar to last   08/07/21: TTE  1. Left ventricular ejection fraction, by estimation, is 55 to 60%. The  left ventricle has normal function. The left ventricle has no regional  wall motion abnormalities. Left ventricular diastolic function could not  be evaluated.   2. Right ventricular systolic function is normal. The right ventricular  size is normal. There is normal pulmonary artery systolic pressure.   3. Left atrial size was severely dilated.   4. Right atrial size was severely dilated.   5. The mitral valve is normal in structure. Mild mitral valve  regurgitation. No evidence of mitral stenosis.   6. Tricuspid valve regurgitation is moderate.   7. The aortic valve is tricuspid. Aortic valve regurgitation is mild. No  aortic stenosis is present.   8. The inferior vena cava is normal in size with greater than 50%  respiratory variability, suggesting right atrial pressure of 3 mmHg.   08/26/2017: TTE Study Conclusions  - Left ventricle: The cavity size was normal. Wall thickness was    normal. Systolic function was normal. The estimated ejection    fraction was in the range of 60% to 65%.  - Aortic valve: There was trivial regurgitation.  - Mitral valve: Calcified annulus.  - Left atrium: The atrium was moderately to severely dilated.  - Right atrium: The atrium was moderately to severely dilated.  - Pulmonary arteries: PA peak pressure: 32 mm Hg (S).  - Pericardium, extracardiac: A trivial pericardial effusion was    identified.     Recent Labs: 10/12/2020: Magnesium 1.8 04/25/2021: ALT 11; BUN 11; Creatinine, Ser 0.87; Hemoglobin 12.9; Platelets 195.0; Potassium 4.2; Sodium 141; TSH 5.60  04/25/2021: Cholesterol 120; HDL 50.30; LDL Cholesterol 45; Total CHOL/HDL Ratio 2; Triglycerides 124.0; VLDL 24.8   CrCl cannot be calculated  (Patient's most recent lab result is older than the maximum 21 days allowed.).   Wt Readings from Last 3 Encounters:  07/16/21 200 lb (  90.7 kg)  04/25/21 201 lb (91.2 kg)  12/18/20 193 lb (87.5 kg)     Other studies reviewed: Additional studies/records reviewed today include: summarized above  ASSESSMENT AND PLAN:  Permanent Afib CHA2DS2Vasc is 4, on warfarin,  monitored and managed by our coumadin clinic  Rate controlled No cardiac awareness  HTN Looks good  HLD Lipids are monitored and managed with her PMD  4. Edema Resolved  5. Secondary hypercoagulable state   Encouraged activity to her ability Discussed need for new EP MD, will transition to Dr. Curt Bears, they are agreeable   Disposition: we will see her back in 70mo sooner if needed  Current medicines are reviewed at length with the patient today.  The patient did not have any concerns regarding medicines.  SVenetia Night PA-C 07/16/2021 11:26 AM     [place] [phone](office)  [phone](fax)

## 2022-05-13 ENCOUNTER — Encounter: Payer: Self-pay | Admitting: Physician Assistant

## 2022-05-13 ENCOUNTER — Ambulatory Visit: Payer: Medicare Other | Attending: Physician Assistant | Admitting: Physician Assistant

## 2022-05-13 VITALS — BP 114/68 | HR 61 | Ht 63.0 in | Wt 204.6 lb

## 2022-05-13 DIAGNOSIS — D6869 Other thrombophilia: Secondary | ICD-10-CM

## 2022-05-13 DIAGNOSIS — I10 Essential (primary) hypertension: Secondary | ICD-10-CM

## 2022-05-13 DIAGNOSIS — R609 Edema, unspecified: Secondary | ICD-10-CM | POA: Diagnosis not present

## 2022-05-13 DIAGNOSIS — I4821 Permanent atrial fibrillation: Secondary | ICD-10-CM | POA: Diagnosis not present

## 2022-05-13 NOTE — Patient Instructions (Signed)
Medication Instructions:   Your physician recommends that you continue on your current medications as directed. Please refer to the Current Medication list given to you today.   *If you need a refill on your cardiac medications before your next appointment, please call your pharmacy*   Lab Work: [place]   If you have labs (blood work) drawn today and your tests are completely normal, you will receive your results only by: [facility] (if you have MyChart) OR A paper copy in the mail If you have any lab test that is abnormal or we need to change your treatment, we will call you to review the results.   Testing/Procedures: NONE ORDERED  TODAY    Follow-Up: At [facility], you and your health needs are our priority.  As part of our continuing mission to provide you with exceptional heart care, we have created designated Provider Care Teams.  These Care Teams include your primary Cardiologist (physician) and Advanced Practice Providers (APPs -  Physician Assistants and Nurse Practitioners) who all work together to provide you with the care you need, when you need it.  We recommend signing up for the patient portal called "MyChart".  Sign up information is provided on this After Visit Summary.  MyChart is used to connect with patients for Virtual Visits (Telemedicine).  Patients are able to view lab/test results, encounter notes, upcoming appointments, etc.  Non-urgent messages can be sent to your provider as well.   To learn more about what you can do with MyChart, go to [email or web address].    Your next appointment:   6 month(s)  Provider:   Allegra Lai, MD    Other Instructions

## 2022-05-14 NOTE — Addendum Note (Signed)
Addended by: Michelle Nasuti on: 05/14/2022 09:55 AM   Modules accepted: Orders

## 2022-05-21 ENCOUNTER — Ambulatory Visit: Payer: Medicare Other | Attending: Cardiology | Admitting: *Deleted

## 2022-05-21 DIAGNOSIS — Z5181 Encounter for therapeutic drug level monitoring: Secondary | ICD-10-CM | POA: Diagnosis not present

## 2022-05-21 DIAGNOSIS — I4891 Unspecified atrial fibrillation: Secondary | ICD-10-CM | POA: Diagnosis not present

## 2022-05-21 LAB — POCT INR: INR: 2 (ref 2.0–3.0)

## 2022-05-21 NOTE — Patient Instructions (Signed)
Description   Today take 1.5 tablets of warfarin then continue taking 1 tablet everyday except 1/2 tablet on Sundays. Stay consistent with greens (2-3 per week). Recheck INR in 3 weeks.  [phone] or [phone] Coumadin clinic. Main # [phone].

## 2022-05-27 LAB — HM AWV

## 2022-05-28 ENCOUNTER — Telehealth: Payer: Self-pay | Admitting: Family Medicine

## 2022-05-28 NOTE — Telephone Encounter (Signed)
Spoke with pharmacy to confirm refills there. They will refill today

## 2022-05-28 NOTE — Telephone Encounter (Signed)
Pt is needing additional refills on   furosemide (LASIX) 20 MG tablet  Pharmacy is correct in the system.

## 2022-06-11 ENCOUNTER — Ambulatory Visit: Payer: Medicare Other | Attending: Cardiology | Admitting: *Deleted

## 2022-06-11 DIAGNOSIS — I4891 Unspecified atrial fibrillation: Secondary | ICD-10-CM | POA: Diagnosis not present

## 2022-06-11 DIAGNOSIS — Z5181 Encounter for therapeutic drug level monitoring: Secondary | ICD-10-CM | POA: Diagnosis not present

## 2022-06-11 LAB — POCT INR: POC INR: 4.1

## 2022-06-11 NOTE — Patient Instructions (Signed)
Description   HOLD WARFARIN TODAY and then continue taking 1 tablet everyday except 1/2 tablet on Sundays. Stay consistent with greens (2-3 per week). Recheck INR in 2 weeks.  [phone] or [phone] Coumadin clinic. Main # [phone].

## 2022-06-25 ENCOUNTER — Ambulatory Visit: Payer: Medicare Other | Attending: Cardiology

## 2022-06-25 DIAGNOSIS — Z5181 Encounter for therapeutic drug level monitoring: Secondary | ICD-10-CM

## 2022-06-25 DIAGNOSIS — I4891 Unspecified atrial fibrillation: Secondary | ICD-10-CM | POA: Diagnosis not present

## 2022-06-25 LAB — POCT INR: INR: 3 (ref 2.0–3.0)

## 2022-06-25 NOTE — Patient Instructions (Signed)
Description   Continue on same dosage of Warfarin 1 tablet everyday except 1/2 tablet on Sundays. Stay consistent with greens (2-3 per week). Recheck INR in 3 weeks.  [phone] or [phone] Coumadin clinic. Main # [phone].

## 2022-07-16 ENCOUNTER — Ambulatory Visit: Payer: Medicare Other | Attending: Cardiovascular Disease

## 2022-07-16 DIAGNOSIS — I4891 Unspecified atrial fibrillation: Secondary | ICD-10-CM | POA: Diagnosis not present

## 2022-07-16 DIAGNOSIS — Z5181 Encounter for therapeutic drug level monitoring: Secondary | ICD-10-CM | POA: Diagnosis not present

## 2022-07-16 LAB — POCT INR: INR: 2.8 (ref 2.0–3.0)

## 2022-07-16 NOTE — Patient Instructions (Signed)
Description   Continue on same dosage of Warfarin 1 tablet everyday except 1/2 tablet on Sundays. Stay consistent with greens (2-3 per week). Recheck INR in 4 weeks.  [phone] or [phone] Coumadin clinic. Main # [phone].

## 2022-08-07 ENCOUNTER — Ambulatory Visit: Payer: Medicare Other | Admitting: Family Medicine

## 2022-08-12 DIAGNOSIS — N390 Urinary tract infection, site not specified: Secondary | ICD-10-CM | POA: Diagnosis not present

## 2022-08-12 DIAGNOSIS — R310 Gross hematuria: Secondary | ICD-10-CM | POA: Diagnosis not present

## 2022-08-14 ENCOUNTER — Ambulatory Visit: Payer: Medicare Other | Attending: Cardiology

## 2022-08-14 DIAGNOSIS — I4891 Unspecified atrial fibrillation: Secondary | ICD-10-CM | POA: Diagnosis not present

## 2022-08-14 LAB — POCT INR: INR: 2.3 (ref 2.0–3.0)

## 2022-08-14 NOTE — Patient Instructions (Signed)
Description   Continue on same dosage of Warfarin 1 tablet everyday except 1/2 tablet on Sundays. Stay consistent with greens (2-3 per week). Recheck INR in 4 weeks.  #[phone] or [phone] Coumadin clinic. Main # [phone].      

## 2022-08-21 ENCOUNTER — Ambulatory Visit (INDEPENDENT_AMBULATORY_CARE_PROVIDER_SITE_OTHER): Payer: Medicare Other | Admitting: Family Medicine

## 2022-08-21 ENCOUNTER — Encounter: Payer: Self-pay | Admitting: Family Medicine

## 2022-08-21 VITALS — BP 100/63 | HR 58 | Temp 97.7°F | Wt 206.4 lb

## 2022-08-21 DIAGNOSIS — D6869 Other thrombophilia: Secondary | ICD-10-CM | POA: Diagnosis not present

## 2022-08-21 DIAGNOSIS — E213 Hyperparathyroidism, unspecified: Secondary | ICD-10-CM

## 2022-08-21 DIAGNOSIS — G8929 Other chronic pain: Secondary | ICD-10-CM | POA: Diagnosis not present

## 2022-08-21 DIAGNOSIS — F5101 Primary insomnia: Secondary | ICD-10-CM

## 2022-08-21 DIAGNOSIS — E785 Hyperlipidemia, unspecified: Secondary | ICD-10-CM | POA: Diagnosis not present

## 2022-08-21 DIAGNOSIS — E034 Atrophy of thyroid (acquired): Secondary | ICD-10-CM | POA: Diagnosis not present

## 2022-08-21 DIAGNOSIS — R310 Gross hematuria: Secondary | ICD-10-CM | POA: Diagnosis not present

## 2022-08-21 DIAGNOSIS — I10 Essential (primary) hypertension: Secondary | ICD-10-CM

## 2022-08-21 DIAGNOSIS — E559 Vitamin D deficiency, unspecified: Secondary | ICD-10-CM

## 2022-08-21 DIAGNOSIS — M47816 Spondylosis without myelopathy or radiculopathy, lumbar region: Secondary | ICD-10-CM

## 2022-08-21 DIAGNOSIS — I700 Atherosclerosis of aorta: Secondary | ICD-10-CM

## 2022-08-21 DIAGNOSIS — I2510 Atherosclerotic heart disease of native coronary artery without angina pectoris: Secondary | ICD-10-CM

## 2022-08-21 DIAGNOSIS — M8580 Other specified disorders of bone density and structure, unspecified site: Secondary | ICD-10-CM

## 2022-08-21 DIAGNOSIS — Z8551 Personal history of malignant neoplasm of bladder: Secondary | ICD-10-CM

## 2022-08-21 DIAGNOSIS — F330 Major depressive disorder, recurrent, mild: Secondary | ICD-10-CM

## 2022-08-21 DIAGNOSIS — M47814 Spondylosis without myelopathy or radiculopathy, thoracic region: Secondary | ICD-10-CM

## 2022-08-21 DIAGNOSIS — M4316 Spondylolisthesis, lumbar region: Secondary | ICD-10-CM

## 2022-08-21 MED ORDER — BENAZEPRIL HCL 20 MG PO TABS: ORAL_TABLET | ORAL | 1 refills | Status: DC

## 2022-08-21 MED ORDER — AMLODIPINE BESYLATE 10 MG PO TABS: 10.0000 mg | ORAL_TABLET | Freq: Every day | ORAL | 1 refills | Status: DC

## 2022-08-21 MED ORDER — PAROXETINE HCL 20 MG PO TABS: ORAL_TABLET | ORAL | 1 refills | Status: DC

## 2022-08-21 MED ORDER — HYDROCODONE-ACETAMINOPHEN 5-325 MG PO TABS: 1.0000 | ORAL_TABLET | Freq: Two times a day (BID) | ORAL | 0 refills | Status: DC | PRN

## 2022-08-21 MED ORDER — GABAPENTIN 100 MG PO CAPS: ORAL_CAPSULE | ORAL | 5 refills | Status: DC

## 2022-08-21 MED ORDER — METOPROLOL TARTRATE 50 MG PO TABS: 25.0000 mg | ORAL_TABLET | Freq: Two times a day (BID) | ORAL | 1 refills | Status: DC

## 2022-08-21 MED ORDER — TRAZODONE HCL 100 MG PO TABS: ORAL_TABLET | ORAL | 1 refills | Status: DC

## 2022-08-21 MED ORDER — POTASSIUM CHLORIDE CRYS ER 20 MEQ PO TBCR: EXTENDED_RELEASE_TABLET | ORAL | 5 refills | Status: DC

## 2022-08-21 MED ORDER — FUROSEMIDE 20 MG PO TABS: ORAL_TABLET | ORAL | 1 refills | Status: DC

## 2022-08-21 MED ORDER — ESCITALOPRAM OXALATE 20 MG PO TABS: 20.0000 mg | ORAL_TABLET | Freq: Every day | ORAL | 1 refills | Status: DC

## 2022-08-21 NOTE — Progress Notes (Signed)
Jennifer House , [birth year], 77 y.o., female MRN: [id number] Patient Care Team    Relationship Specialty Notifications Start End  Natalia Leatherwood, DO PCP - General Family Medicine  01/23/15   Hillis Range, MD (Inactive) Attending Physician Cardiology  07/25/11   Bernette Redbird, MD Consulting Physician Gastroenterology  08/25/15   Barron Alvine, MD (Inactive) Consulting Physician Urology  08/25/15   Tanya Nones, OD  Optometry  08/25/15    Comment: "[name]" at [facility]  Lanier Prude, MD Consulting Physician Cardiology  08/06/21     Chief Complaint  Patient presents with   Hypertension     Subjective:Jennifer House is a 77 y.o. female present for follow-up on multiple chronic medical conditions Hypertension/hyperlipidemia/hypertriglyceridemia/A. fib: Pt reports compliance with Benzapril 30 mg, amlodipine 10 mg, metoprolol 25 mg twice a day, Lasix 20 mg (MWF) today. Patient denies chest pain, shortness of breath, dizziness or lower extremity edema.  Pt is on warfarin therapy per cardiology. Pt is prescribed Zetia and tolerating statin.  Diet: Low-sodium Exercise: Tries to exercise  RF: Hypertension, hyperlipidemia, family history of heart disease and stroke, personal history A. Fib, obesity, former smoker  Depression/insomnia: Patient reports compliance with Paxil 20 mg, Lexapro 20 mg and trazodone 100 mg .  She feels this regimen is working well for her.  Her husband passed away January 05, 2021 and her daughter has been helping her/emotional support.      Encounter for chronic pain/lumbar arthritis: tramadol not effective.  Vicodin is working well for her and helping her remain active.  She does not routinely take the daytime dose, but does take a few times a week.  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 Pain contract signed (Y/N): Y Date narcotic database last reviewed (include red flags): 08/26/22    Recent hematuria: Patient reports gross hematuria.  He was seen at the urgent care 08/16/2022.  Point-of-care urine positive for hematuria and leuks.  However her urine culture was negative.  She was treated with Cipro x 7 days.  She reports the hematuria was to have improved.  She never had other symptoms such as dysuria or urinary frequency, fever or low back pain.  She does have a significant medical history of bladder cancer in 2000 and 2006.  She was treated by Dr. Isabel Caprice at that time.     08/21/2022   11:05 AM 02/27/2022    8:35 AM 09/26/2021    8:06 AM 04/25/2021   10:27 AM 01/03/2021    1:42 PM  Depression screen PHQ 2/9  Decreased Interest 1 1 0 0 0  Down, Depressed, Hopeless 1 0 0 0 0  PHQ - 2 Score 2 1 0 0 0  Altered sleeping 2 1  0   Tired, decreased energy 2 0  0   Change in appetite 0 0  0   Feeling bad or failure about yourself  0 0  0   Trouble concentrating 0 0  0   Moving slowly or fidgety/restless 0 0  0   Suicidal thoughts 0 0  0   PHQ-9 Score 6 2  0     Allergies  Allergen Reactions   Ibandronate Sodium Other (See Comments)  Bones hurt   Pravachol [Pravastatin Sodium] Other (See Comments)    Myalgias    Nsaids     anticoagulated   Ultram [Tramadol Hcl] Nausea Only   Social History   Social History Narrative   Lives in [place], with husband Gregory.   Has 4 adult children , highest level of education is seventh grade.   Former smoker, denies recreational drugs or alcohol use.   Patient drink caffeine beverages, takes a daily vitamin   Patient was her seatbelt , she has dentures , she has a smoke detector in the home , there are guns in the home a  Locked case    Patient feels safe in her relationships       Past Medical History:  Diagnosis  Date   AKI (acute kidney injury) (HCC) 10/19/2020   Anxiety    Atrial fibrillation (HCC)    longstanding persistent   Carcinoma of bladder (HCC) 2000   transitional cell hx; Dr. Isabel Caprice   Closed displaced fracture of left femoral neck (HCC) 08/28/2017   Colon polyps    Depression    GERD (gastroesophageal reflux disease)    Headache(784.0)    hx of migraines   Hematuria    hx   HLD (hyperlipidemia)    mixed   HTN (hypertension)    Hypothyroidism    Insomnia    Liver function test abnormality    hx fatty liver   Lobar pneumonia (HCC) 10/19/2020   Medicare annual wellness visit, subsequent 08/25/2015   Murmur, cardiac    Nonalcoholic fatty liver disease    Obesity    Osteoarthritis    low back pain with left sciatica   Osteopenia    dexa 2013   Renal calculus    hx   RENAL CALCULUS, HX OF 01/31/2009   Qualifier: Diagnosis of  By: Susette Racer CMA, Jewel     Restless leg syndrome    Past Surgical History:  Procedure Laterality Date   APPENDECTOMY     CARDIAC CATHETERIZATION  1/12   revealed normal cors and LV function   COLONOSCOPY     cystocopy  10/17/03   left stent placement, TURBT   FOOT SURGERY     LUMBAR LAMINECTOMY/DECOMPRESSION MICRODISCECTOMY N/A 01/19/2013   Procedure: LUMBAR THREE FOUR LUMBAR LAMINECTOMY/DECOMPRESSION MICRODISCECTOMY 1 LEVEL;  Surgeon: Reinaldo Meeker, MD;  Location: [facility];  Service: Neurosurgery;  Laterality: N/A;   TOTAL ABDOMINAL HYSTERECTOMY  1985   TOTAL HIP ARTHROPLASTY Left 08/28/2017   Procedure: TOTAL HIP ARTHROPLASTY ANTERIOR APPROACH;  Surgeon: Samson Frederic, MD;  Location: [facility];  Service: Orthopedics;  Laterality: Left;   Family History  Problem Relation Age of Onset   Heart failure Mother    Heart attack Mother    Hypertension Mother    Thyroid cancer Mother    Stroke Father    Hypertension Father    Stroke Brother    Breast cancer Neg Hx    Colon cancer Neg Hx    Allergies as of 08/21/2022       Reactions   Ibandronate  Sodium Other (See Comments)   Bones hurt   Pravachol [pravastatin Sodium] Other (See Comments)   Myalgias   Nsaids    anticoagulated   Ultram [tramadol Hcl] Nausea Only        Medication List        Accurate as of August 21, 2022 11:59 PM. If you have any questions, ask your nurse or doctor.  amLODipine 10 MG tablet Commonly known as: NORVASC Take 1 tablet (10 mg total) by mouth daily.   atorvastatin 20 MG tablet Commonly known as: LIPITOR Take 1 tablet (20 mg total) by mouth daily.   benazepril 20 MG tablet Commonly known as: LOTENSIN TAKE 1 AND 1/2 TABLETS(30 MG) BY MOUTH DAILY   calcium carbonate 1500 (600 Ca) MG Tabs tablet Commonly known as: OSCAL Take 3 tablets by mouth daily.   Centrum Silver tablet Take 1 tablet by mouth daily.   cholecalciferol 1000 units tablet Commonly known as: VITAMIN D Take 1,000 Units by mouth daily.   cyanocobalamin 1000 MCG tablet Commonly known as: VITAMIN B12 Take 1,000 mcg by mouth daily.   escitalopram 20 MG tablet Commonly known as: LEXAPRO Take 1 tablet (20 mg total) by mouth daily.   ezetimibe 10 MG tablet Commonly known as: ZETIA TAKE 1 TABLET(10 MG) BY MOUTH DAILY   furosemide 20 MG tablet Commonly known as: LASIX TAKE 1 TABLET BY MOUTH EVERY MONDAY, WEDNESDAY AND FRIDAY   gabapentin 100 MG capsule Commonly known as: NEURONTIN 1 cap in morning and 2 caps at night   HYDROcodone-acetaminophen 5-325 MG tablet Commonly known as: NORCO/VICODIN Take 1 tablet by mouth 2 (two) times daily as needed for moderate pain.   levothyroxine 75 MCG tablet Commonly known as: SYNTHROID TAKE 1 TABLET(75 MCG) BY MOUTH DAILY. Monday- Saturday. 1.5 tabs on Sunday.   metoprolol tartrate 50 MG tablet Commonly known as: LOPRESSOR Take 0.5 tablets (25 mg total) by mouth 2 (two) times daily.   nitroGLYCERIN 0.4 MG SL tablet Commonly known as: NITROSTAT PLACE 1 TABLET UNDER THE TONGUE EVERY 5 MINUTES AS NEEDED FOR CHEST  PAIN,(MAX 3 TABLETS)   PARoxetine 20 MG tablet Commonly known as: PAXIL TAKE 1 TABLET(20 MG) BY MOUTH DAILY   potassium chloride SA 20 MEQ tablet Commonly known as: KLOR-CON M 1 tab p.o. on days taking Lasix   traZODone 100 MG tablet Commonly known as: DESYREL TAKE 1 TABLET(100 MG) BY MOUTH AT BEDTIME   VITAMIN C PO Take by mouth.   warfarin 5 MG tablet Commonly known as: COUMADIN Take as directed by the anticoagulation clinic. If you are unsure how to take this medication, talk to your nurse or doctor. Original instructions: TAKE 1 TABLET BY MOUTH DAILY AS DIRECTED BY COUMADIN CLINIC        All past medical history, surgical history, allergies, family history, immunizations andmedications were updated in the EMR today and reviewed under the history and medication portions of their EMR.     ROS: Negative, with the exception of above mentioned in HPI   Objective:  BP 100/63   Pulse (!) 58   Temp 97.7 F (36.5 C)   Wt 206 lb 6.4 oz (93.6 kg)   SpO2 96%   BMI 36.56 kg/m  Body mass index is 36.56 kg/m. Physical Exam Vitals and nursing note reviewed.  Constitutional:      General: She is not in acute distress.    Appearance: Normal appearance. She is not ill-appearing, toxic-appearing or diaphoretic.  HENT:     Head: Normocephalic and atraumatic.     Mouth/Throat:     Mouth: Mucous membranes are moist.  Eyes:     General: No scleral icterus.       Right eye: No discharge.        Left eye: No discharge.     Extraocular Movements: Extraocular movements intact.     Conjunctiva/sclera: Conjunctivae normal.  Pupils: Pupils are equal, round, and reactive to light.  Cardiovascular:     Rate and Rhythm: Normal rate and regular rhythm.     Heart sounds: No murmur heard. Pulmonary:     Effort: Pulmonary effort is normal. No respiratory distress.     Breath sounds: Normal breath sounds. No wheezing, rhonchi or rales.  Abdominal:     General: Bowel sounds are  normal. There is no distension.     Palpations: Abdomen is soft.     Tenderness: There is no abdominal tenderness. There is no right CVA tenderness, left CVA tenderness or rebound.  Musculoskeletal:     Right lower leg: No edema.     Left lower leg: No edema.  Skin:    General: Skin is warm.     Findings: No rash.  Neurological:     Mental Status: She is alert and oriented to person, place, and time. Mental status is at baseline.     Motor: No weakness.     Gait: Gait normal.  Psychiatric:        Mood and Affect: Mood normal.        Behavior: Behavior normal.        Thought Content: Thought content normal.        Judgment: Judgment normal.      Assessment/Plan: Sukanya Enz is a 77 y.o. female present for OV for chronic medical conditions and acute concern Arthritis, lumbar spine/Osteoarthritis of thoracic spine, unspecified spinal osteoarthritis complication status/Spondylolisthesis at L3-L4 level/Encounter for chronic pain/morbid obesity Stable Medication has increased her quality of life and is able to keep her active.  - Chronic pain script: continue  hydrocodone 5-325 mg BID PRn  #180 supplied.  - UDS-UTD - contract signed  - [place] controlled substance database reviewed 08/26/22 -Continue gabapentin 100 mg 1 tab morning and 2 tabs before bed. - she is tolerating.  - Continue Vicodin 5-3 25 twice daily as needed - f/u after 6 months if needing refills.  Essential hypertension, benign/hyperlipidemia/A. Fib/ obesity/Atrial fibrillation, unspecified type (HCC)/Aortic atherosclerosis (HCC)/acquired thrombophilia/morbid obesity Stable Continue benzapril to 30 mg daily Continue amlodipine 10  Continue Lasix MWF Continue metoprolol 25 BID  Continue Zetia 10 mg daily Continue statin- tolerating.  - Continue follow-up with cardiology for atrial fibrillation and coumadin management - low salt diet, increase exercise.  Labs due next visit -Follow up 5.5  months . Hypothyroid: She has been instructed to continue her levothyroxine 1 tab daily except for on Sunday she was to take 1.5 tabs.   Continue levothyroxine 75 mcg daily, with an extra half a tablet on Sundays. Labs due next visit   Hypocalcemia/Osteopenia, unspecified location/hyperparathyroidism - takes vit d 1000u daily. Last dexa 2013 - under the care of  endocrine.  -Vitamin D due next visit  Insomnia, unspecified type/ Moderate episode of recurrent major depressive disorder (HCC) Stable Continue trazodone 100 mg nightly Continue lexapro 20 mg daily Continue Paxil to 20 mg daily -5.5 months  Hematuria/history of bladder cancer: Point-of-care urinalysis and urine culture collected today. With patient's history of bladder cancer and new hematuria, elect to refer to urology.  Her urologist has retired.  Return in about 5 months (around 01/31/2023) for Routine chronic condition follow-up. Labs will also be collected this visit. .  Reviewed expectations re: course of current medical issues. Discussed self-management of symptoms. Outlined signs and symptoms indicating need for more acute intervention. Patient verbalized understanding and all questions were answered. Patient received an After-Visit  Summary.  52 minutes spent during patient encounter covering multiple chronic conditions and new hematuria in a patient with a history of bladder cancer.  Orders Placed This Encounter  Procedures   Urine Culture   Urinalysis w microscopic + reflex cultur   Basic Metabolic Panel (BMET)   REFLEXIVE URINE CULTURE    Meds ordered this encounter  Medications   amLODipine (NORVASC) 10 MG tablet    Sig: Take 1 tablet (10 mg total) by mouth daily.    Dispense:  90 tablet    Refill:  1   benazepril (LOTENSIN) 20 MG tablet    Sig: TAKE 1 AND 1/2 TABLETS(30 MG) BY MOUTH DAILY    Dispense:  135 tablet    Refill:  1   escitalopram (LEXAPRO) 20 MG tablet    Sig: Take 1 tablet (20 mg  total) by mouth daily.    Dispense:  90 tablet    Refill:  1   furosemide (LASIX) 20 MG tablet    Sig: TAKE 1 TABLET BY MOUTH EVERY MONDAY, WEDNESDAY AND FRIDAY    Dispense:  45 tablet    Refill:  1   gabapentin (NEURONTIN) 100 MG capsule    Sig: 1 cap in morning and 2 caps at night    Dispense:  270 capsule    Refill:  5   HYDROcodone-acetaminophen (NORCO/VICODIN) 5-325 MG tablet    Sig: Take 1 tablet by mouth 2 (two) times daily as needed for moderate pain.    Dispense:  180 tablet    Refill:  0   metoprolol tartrate (LOPRESSOR) 50 MG tablet    Sig: Take 0.5 tablets (25 mg total) by mouth 2 (two) times daily.    Dispense:  90 tablet    Refill:  1   PARoxetine (PAXIL) 20 MG tablet    Sig: TAKE 1 TABLET(20 MG) BY MOUTH DAILY    Dispense:  90 tablet    Refill:  1   potassium chloride SA (KLOR-CON M) 20 MEQ tablet    Sig: 1 tab p.o. on days taking Lasix    Dispense:  30 tablet    Refill:  5   traZODone (DESYREL) 100 MG tablet    Sig: TAKE 1 TABLET(100 MG) BY MOUTH AT BEDTIME    Dispense:  90 tablet    Refill:  1      Note is dictated utilizing voice recognition software. Although note has been proof read prior to signing, occasional typographical errors still can be missed. If any questions arise, please do not hesitate to call for verification.   electronically signed by:  Felix Pacini, DO  [facility] - [place]

## 2022-08-21 NOTE — Patient Instructions (Addendum)
Return in about 5 months (around 01/31/2023) for Routine chronic condition follow-up. Labs will also be collected this visit. Randie Heinz to see you today.  I have refilled the medication(s) we provide.   If labs were collected, we will inform you of lab results once received either by echart message or telephone call.   - echart message- for normal results that have been seen by the patient already.   - telephone call: abnormal results or if patient has not viewed results in their echart.

## 2022-08-22 ENCOUNTER — Telehealth: Payer: Self-pay | Admitting: Family Medicine

## 2022-08-22 DIAGNOSIS — Z8551 Personal history of malignant neoplasm of bladder: Secondary | ICD-10-CM

## 2022-08-22 LAB — BASIC METABOLIC PANEL
BUN: 14 mg/dL (ref 6–23)
CO2: 26 mEq/L (ref 19–32)
Calcium: 9.5 mg/dL (ref 8.4–10.5)
Chloride: 102 mEq/L (ref 96–112)
Creatinine, Ser: 0.92 mg/dL (ref 0.40–1.20)
GFR: 59.44 mL/min — ABNORMAL LOW (ref >60.00)
Glucose, Bld: 99 mg/dL (ref 70–99)
Potassium: 4 mEq/L (ref 3.5–5.1)
Sodium: 142 mEq/L (ref 135–145)

## 2022-08-22 LAB — URINALYSIS W MICROSCOPIC + REFLEX CULTURE: Bilirubin Urine: NEGATIVE

## 2022-08-22 NOTE — Telephone Encounter (Signed)
Spoke with patient's daughter regarding results/recommendations.

## 2022-08-22 NOTE — Telephone Encounter (Signed)
Please inform patient there is still quite a bit of blood in her urine.  I am waiting on the urine culture to return to see if we need to start an antibiotic. I did go ahead and place the referral to urology at the location we discussed at the med center [facility] for her since there is still blood in her urine with her history of cancer.

## 2022-08-23 ENCOUNTER — Telehealth: Payer: Self-pay | Admitting: Family Medicine

## 2022-08-23 LAB — URINALYSIS W MICROSCOPIC + REFLEX CULTURE
Glucose, UA: NEGATIVE
Ketones, ur: NEGATIVE
Nitrites, Initial: NEGATIVE
Specific Gravity, Urine: 1.016 (ref 1.001–1.035)
pH: 5.5 (ref 5.0–8.0)

## 2022-08-23 LAB — URINE CULTURE
MICRO NUMBER:: 15049276
Result:: NO GROWTH
SPECIMEN QUALITY:: ADEQUATE

## 2022-08-23 LAB — CULTURE INDICATED

## 2022-08-23 NOTE — Telephone Encounter (Signed)
These inform patient her urine culture is normal, she does not have a bacterial infection that would require an antibiotic.

## 2022-08-26 ENCOUNTER — Other Ambulatory Visit (HOSPITAL_COMMUNITY): Payer: Self-pay

## 2022-08-28 ENCOUNTER — Other Ambulatory Visit (HOSPITAL_COMMUNITY): Payer: Self-pay

## 2022-08-28 ENCOUNTER — Telehealth: Payer: Self-pay

## 2022-08-28 NOTE — Telephone Encounter (Signed)
Patient Advocate Encounter   Received notification from [employer] Part D that prior authorization is required for HYDROcodone-Acetaminophen 5-325MG  tablets  Submitted: 08-28-2022 Key [id number]  Status is pending

## 2022-08-31 NOTE — Telephone Encounter (Signed)
Patient Advocate Encounter  Prior Authorization for HYDROcodone-Acetaminophen 5-325MG  tablets has been approved through [employer] Part D.    Key: [id number]  Effective: 08-28-2022 to 09-27-2022

## 2022-09-11 ENCOUNTER — Ambulatory Visit: Payer: Medicare Other | Attending: Cardiology

## 2022-09-11 DIAGNOSIS — I4891 Unspecified atrial fibrillation: Secondary | ICD-10-CM | POA: Diagnosis not present

## 2022-09-11 LAB — POCT INR: INR: 2.8 (ref 2.0–3.0)

## 2022-09-11 NOTE — Patient Instructions (Signed)
Description   Continue on same dosage of Warfarin 1 tablet everyday except 1/2 tablet on Sundays.  Stay consistent with greens (3 per week).  Recheck INR in 5 weeks.  [phone] or [phone] Coumadin clinic. Main # [phone].

## 2022-10-16 ENCOUNTER — Ambulatory Visit: Payer: Medicare Other | Attending: Cardiology

## 2022-10-16 DIAGNOSIS — I4891 Unspecified atrial fibrillation: Secondary | ICD-10-CM | POA: Diagnosis not present

## 2022-10-16 LAB — POCT INR: INR: 2.5 (ref 2.0–3.0)

## 2022-10-16 NOTE — Patient Instructions (Signed)
Description   Continue on same dosage of Warfarin 1 tablet everyday except 1/2 tablet on Sundays.  Stay consistent with greens (3 per week).  Recheck INR in 6 weeks.  [phone] or [phone] Coumadin clinic. Main # [phone].

## 2022-11-23 IMAGING — DX DG FOOT COMPLETE 3+V*R*
3 series · 3 of 3 positions shown · non-contrast
Comparison: None.

CLINICAL DATA: Mass right lateral foot/ankle.  No known injury

EXAM:
RIGHT FOOT COMPLETE - 3+ VIEW

[foot ap]
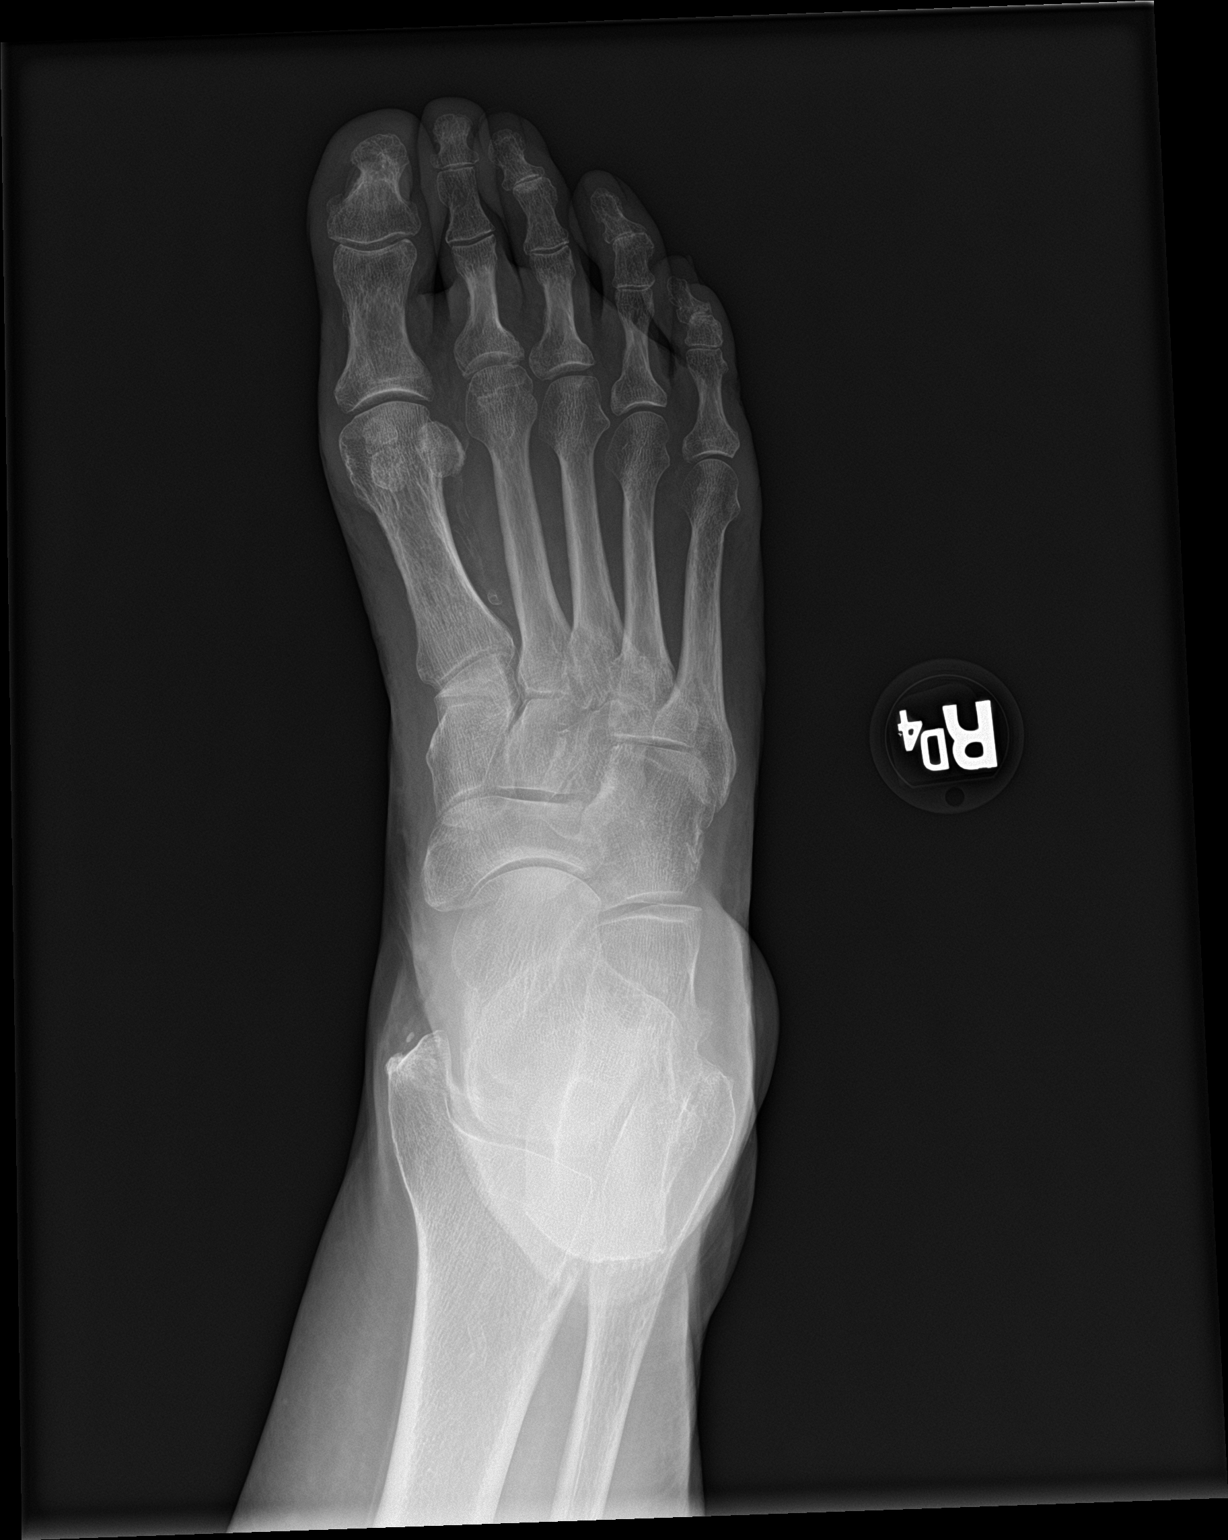

[foot obl]
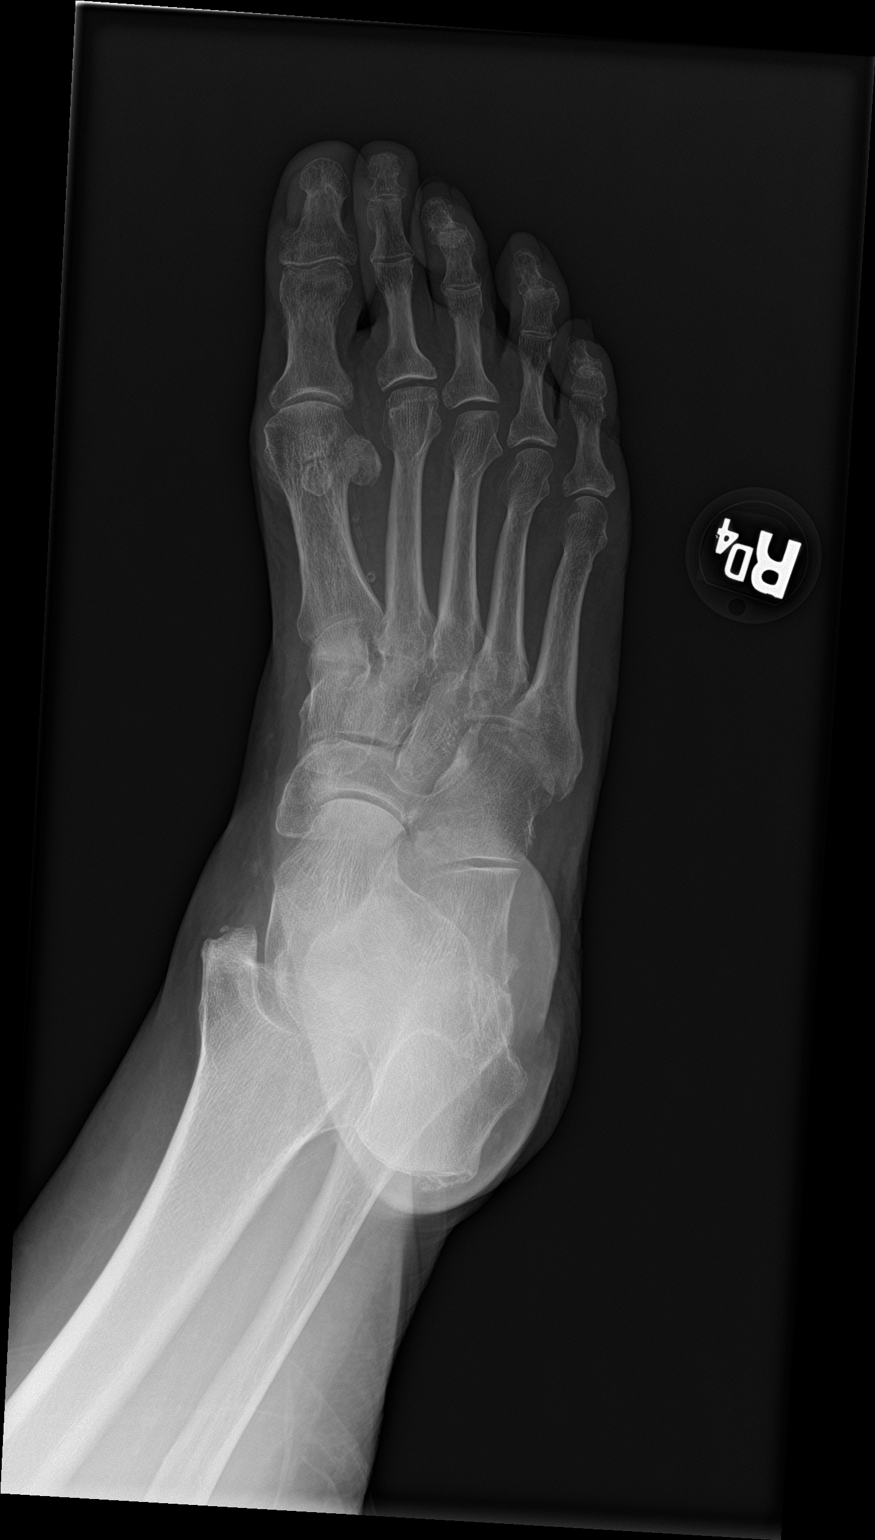

[foot lat]
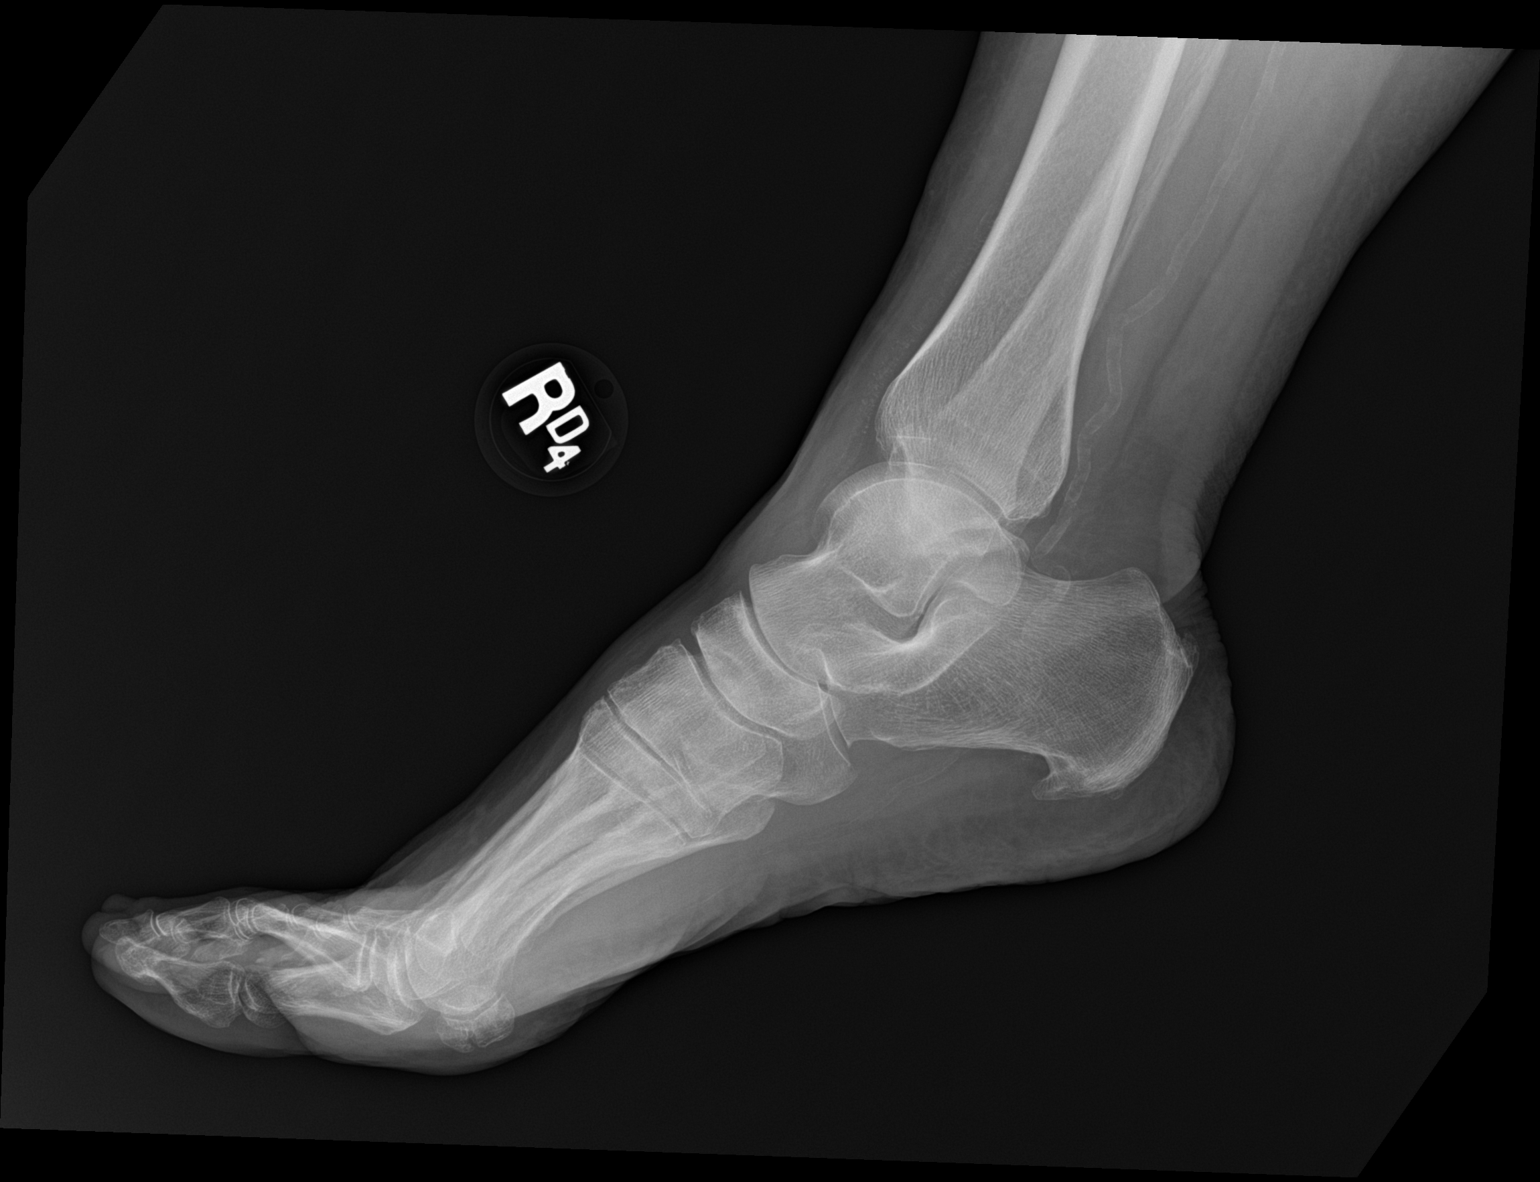

[3 of 3 positions shown; findings below may reference images not displayed]

FINDINGS: There is no evidence of fracture or dislocation. Normal alignment
and joint spaces. Minor osteoarthritis in the midfoot. Focal soft
tissue prominence distal to the lateral malleolus of the ankle.
There are vascular calcifications.
IMPRESSION: 1. Focal soft tissue prominence distal to the lateral malleolus of
the ankle.
2. Minor osteoarthritis in the midfoot.

## 2022-11-23 IMAGING — DX DG ANKLE COMPLETE 3+V*R*
3 series · 3 of 3 positions shown · non-contrast
Comparison: None.

CLINICAL DATA: Mass right lateral foot/ankle.  No known injury.

EXAM:
RIGHT ANKLE - COMPLETE 3+ VIEW

[ankle ap]
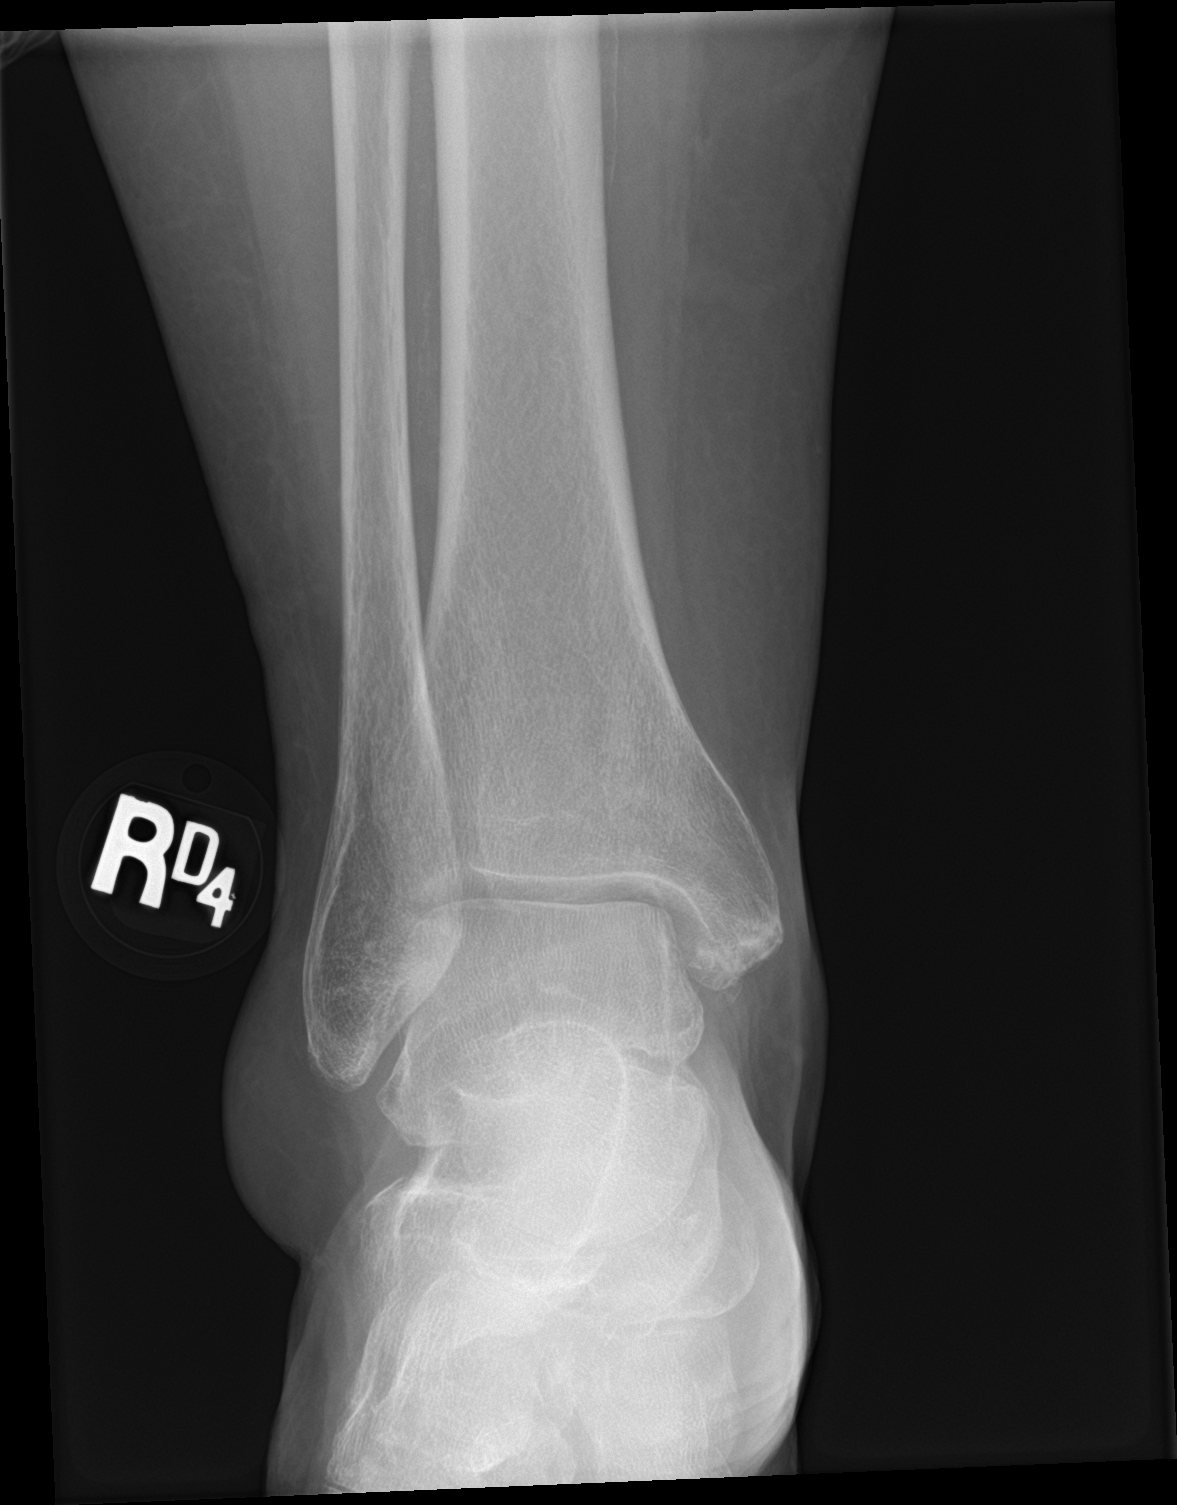

[ankle obl]
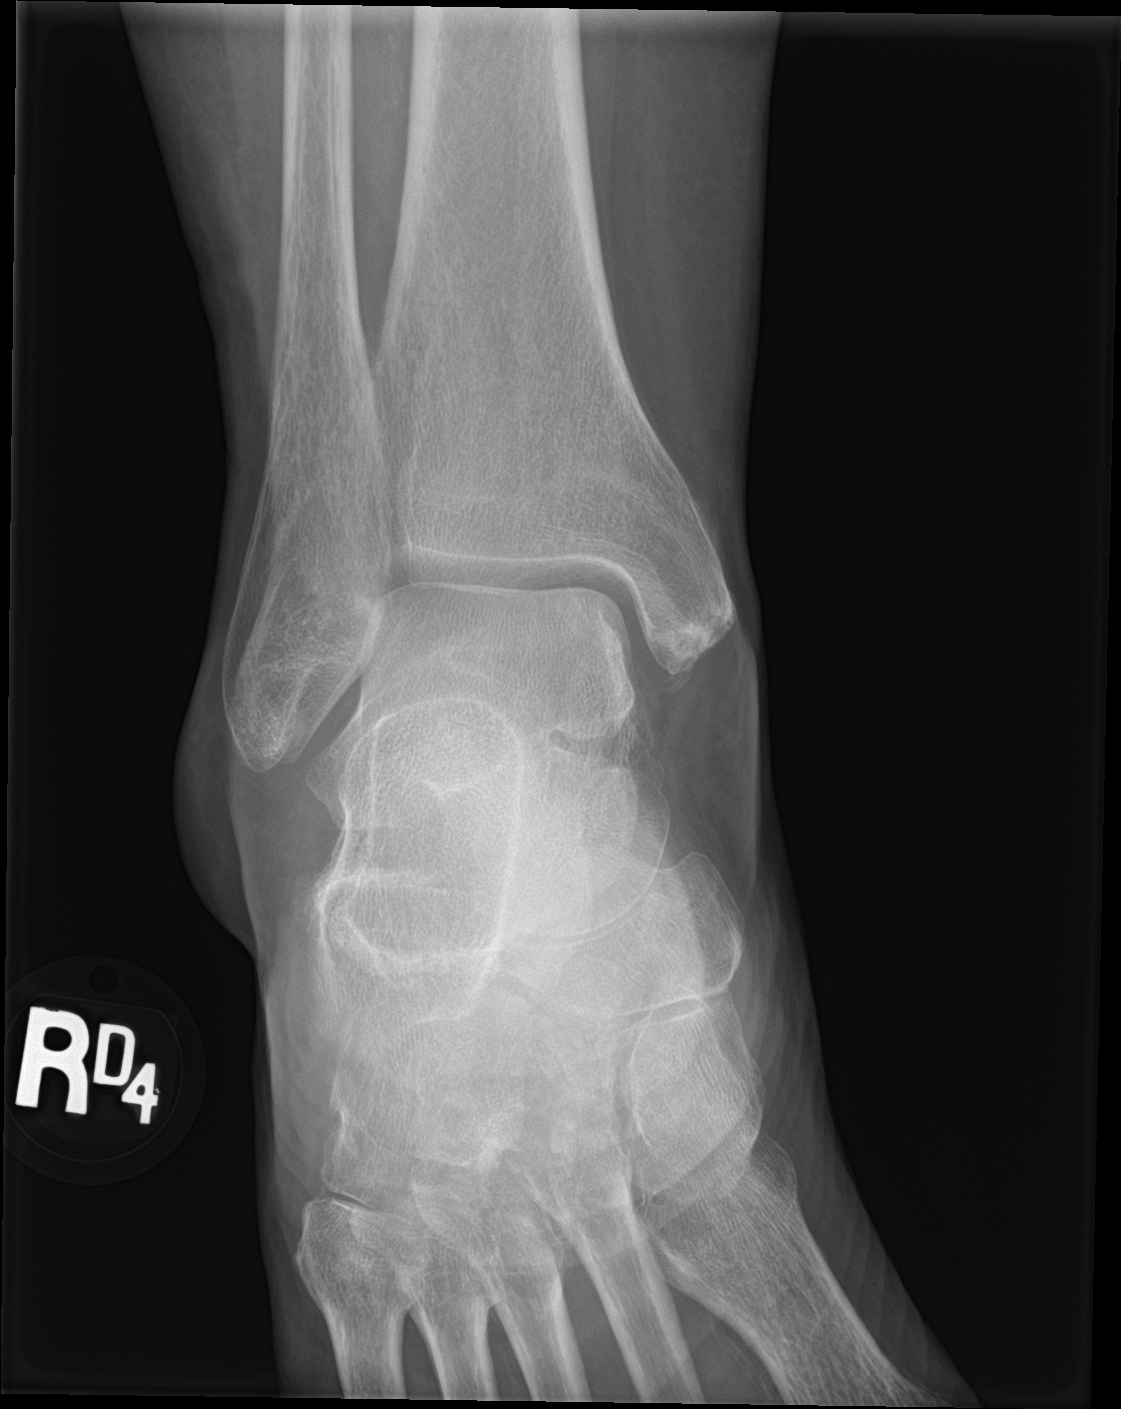

[ankle lat]
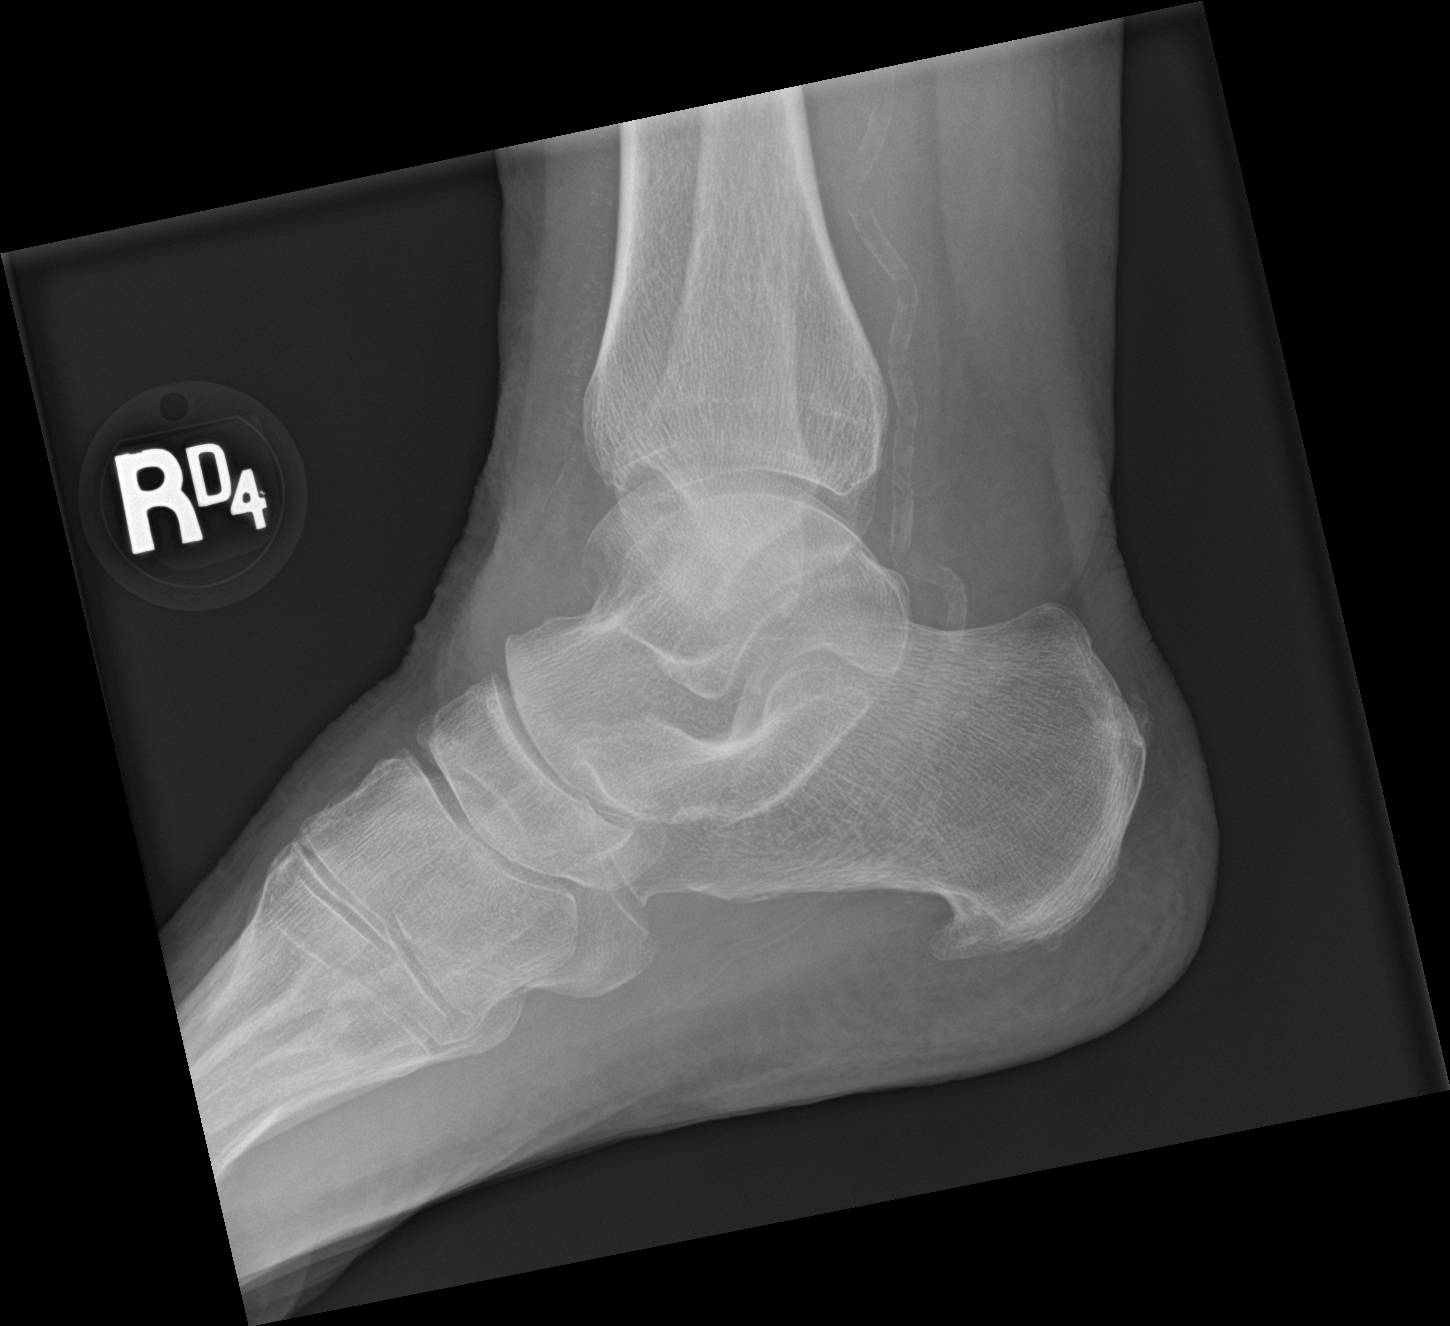

[3 of 3 positions shown; findings below may reference images not displayed]

FINDINGS: There is no evidence of fracture or dislocation. Possible joint
effusion. Normal alignment and ankle mortise. Minimal enthesopathic
change adjacent to the medial malleolus. There is a moderate plantar
calcaneal spur with small Achilles tendon enthesophyte. There is no
focal bone lesion.

Focal soft tissue prominence distal to the lateral malleolus. No
internal mineralization or calcification. There are vascular
calcifications.
IMPRESSION: 1. Focal soft tissue prominence distal to the lateral malleolus, no
internal mineralization.
2. Possible joint effusion.
3. Minimal enthesopathic change adjacent to the medial malleolus.
4. Moderate plantar calcaneal spur with small Achilles tendon
enthesophyte.
5. Vascular calcifications.

## 2022-11-27 ENCOUNTER — Ambulatory Visit: Payer: Medicare Other | Attending: Internal Medicine | Admitting: *Deleted

## 2022-11-27 DIAGNOSIS — Z5181 Encounter for therapeutic drug level monitoring: Secondary | ICD-10-CM | POA: Diagnosis not present

## 2022-11-27 DIAGNOSIS — I4891 Unspecified atrial fibrillation: Secondary | ICD-10-CM

## 2022-11-27 LAB — POCT INR: INR: 2.3 (ref 2.0–3.0)

## 2022-11-27 NOTE — Patient Instructions (Addendum)
  Description   Continue taking Warfarin 1 tablet everyday except 1/2 tablet on Sundays.  Stay consistent with greens (3 per week).  Recheck INR in 6 weeks.  [phone] or [phone] Coumadin clinic. Main # [phone].

## 2023-01-07 ENCOUNTER — Ambulatory Visit: Payer: Medicare Other | Admitting: Cardiology

## 2023-01-08 ENCOUNTER — Ambulatory Visit: Payer: Medicare Other | Attending: Cardiology | Admitting: *Deleted

## 2023-01-08 DIAGNOSIS — I4891 Unspecified atrial fibrillation: Secondary | ICD-10-CM | POA: Diagnosis not present

## 2023-01-08 DIAGNOSIS — Z5181 Encounter for therapeutic drug level monitoring: Secondary | ICD-10-CM

## 2023-01-08 LAB — POCT INR: INR: 2.7 (ref 2.0–3.0)

## 2023-01-08 NOTE — Patient Instructions (Signed)
  Description   Continue taking Warfarin 1 tablet everyday except 1/2 tablet on Sundays.  Stay consistent with greens (3 per week).  Recheck INR in 6 weeks.  [phone] or [phone] Coumadin clinic. Main # [phone].

## 2023-01-21 ENCOUNTER — Other Ambulatory Visit: Payer: Self-pay | Admitting: Physician Assistant

## 2023-01-21 DIAGNOSIS — I4891 Unspecified atrial fibrillation: Secondary | ICD-10-CM

## 2023-01-21 NOTE — Telephone Encounter (Signed)
Patient is asking for a refill.

## 2023-01-21 NOTE — Telephone Encounter (Addendum)
Warfarin 5mg  refill [facility] Last INR 01/08/23 Last OV 05/13/22

## 2023-01-30 ENCOUNTER — Ambulatory Visit: Payer: Medicare Other | Admitting: Physician Assistant

## 2023-02-04 ENCOUNTER — Ambulatory Visit: Payer: Medicare Other | Admitting: Family Medicine

## 2023-02-04 ENCOUNTER — Encounter: Payer: Self-pay | Admitting: Family Medicine

## 2023-02-04 VITALS — BP 118/80 | HR 65 | Temp 97.5°F | Wt 201.2 lb

## 2023-02-04 DIAGNOSIS — M4316 Spondylolisthesis, lumbar region: Secondary | ICD-10-CM | POA: Diagnosis not present

## 2023-02-04 DIAGNOSIS — I4891 Unspecified atrial fibrillation: Secondary | ICD-10-CM | POA: Diagnosis not present

## 2023-02-04 DIAGNOSIS — E785 Hyperlipidemia, unspecified: Secondary | ICD-10-CM | POA: Diagnosis not present

## 2023-02-04 DIAGNOSIS — F5101 Primary insomnia: Secondary | ICD-10-CM

## 2023-02-04 DIAGNOSIS — I10 Essential (primary) hypertension: Secondary | ICD-10-CM | POA: Diagnosis not present

## 2023-02-04 DIAGNOSIS — I700 Atherosclerosis of aorta: Secondary | ICD-10-CM

## 2023-02-04 DIAGNOSIS — G8929 Other chronic pain: Secondary | ICD-10-CM

## 2023-02-04 DIAGNOSIS — E213 Hyperparathyroidism, unspecified: Secondary | ICD-10-CM

## 2023-02-04 DIAGNOSIS — Z23 Encounter for immunization: Secondary | ICD-10-CM

## 2023-02-04 DIAGNOSIS — E063 Autoimmune thyroiditis: Secondary | ICD-10-CM | POA: Diagnosis not present

## 2023-02-04 DIAGNOSIS — D6869 Other thrombophilia: Secondary | ICD-10-CM

## 2023-02-04 DIAGNOSIS — I2510 Atherosclerotic heart disease of native coronary artery without angina pectoris: Secondary | ICD-10-CM | POA: Diagnosis not present

## 2023-02-04 DIAGNOSIS — R7309 Other abnormal glucose: Secondary | ICD-10-CM

## 2023-02-04 DIAGNOSIS — E559 Vitamin D deficiency, unspecified: Secondary | ICD-10-CM

## 2023-02-04 DIAGNOSIS — F330 Major depressive disorder, recurrent, mild: Secondary | ICD-10-CM

## 2023-02-04 DIAGNOSIS — M4724 Other spondylosis with radiculopathy, thoracic region: Secondary | ICD-10-CM

## 2023-02-04 DIAGNOSIS — M8589 Other specified disorders of bone density and structure, multiple sites: Secondary | ICD-10-CM

## 2023-02-04 DIAGNOSIS — M47816 Spondylosis without myelopathy or radiculopathy, lumbar region: Secondary | ICD-10-CM

## 2023-02-04 LAB — CBC
HCT: 41.9 % (ref 36.0–46.0)
Hemoglobin: 13.3 g/dL (ref 12.0–15.0)
MCHC: 31.7 g/dL (ref 30.0–36.0)
MCV: 83.6 fL (ref 78.0–100.0)
Platelets: 184 10*3/uL (ref 150.0–400.0)
RBC: 5.01 Mil/uL (ref 3.87–5.11)
RDW: 15 % (ref 11.5–15.5)
WBC: 5.8 10*3/uL (ref 4.0–10.5)

## 2023-02-04 LAB — COMPREHENSIVE METABOLIC PANEL
ALT: 10 U/L (ref 0–35)
AST: 13 U/L (ref 0–37)
Albumin: 4.3 g/dL (ref 3.5–5.2)
Alkaline Phosphatase: 72 U/L (ref 39–117)
BUN: 12 mg/dL (ref 6–23)
CO2: 30 meq/L (ref 19–32)
Calcium: 9.7 mg/dL (ref 8.4–10.5)
Chloride: 102 meq/L (ref 96–112)
Creatinine, Ser: 0.87 mg/dL (ref 0.40–1.20)
GFR: 63.36 mL/min (ref >60.00)
Glucose, Bld: 100 mg/dL — ABNORMAL HIGH (ref 70–99)
Potassium: 4.7 meq/L (ref 3.5–5.1)
Sodium: 140 meq/L (ref 135–145)
Total Bilirubin: 0.7 mg/dL (ref 0.2–1.2)
Total Protein: 6.8 g/dL (ref 6.0–8.3)

## 2023-02-04 LAB — TSH: TSH: 1.27 u[IU]/mL (ref 0.35–5.50)

## 2023-02-04 LAB — LIPID PANEL
Cholesterol: 126 mg/dL (ref 0–200)
HDL: 43.4 mg/dL (ref >39.00)
LDL Cholesterol: 51 mg/dL (ref 0–99)
NonHDL: 83.06
Total CHOL/HDL Ratio: 3
Triglycerides: 161 mg/dL — ABNORMAL HIGH (ref 0.0–149.0)
VLDL: 32.2 mg/dL (ref 0.0–40.0)

## 2023-02-04 LAB — HEMOGLOBIN A1C: Hgb A1c MFr Bld: 6.1 % (ref 4.6–6.5)

## 2023-02-04 MED ORDER — HYDROCODONE-ACETAMINOPHEN 5-325 MG PO TABS: 1.0000 | ORAL_TABLET | Freq: Two times a day (BID) | ORAL | 0 refills | Status: DC | PRN

## 2023-02-04 MED ORDER — GABAPENTIN 100 MG PO CAPS: ORAL_CAPSULE | ORAL | 5 refills | Status: DC

## 2023-02-04 MED ORDER — METOPROLOL TARTRATE 50 MG PO TABS: 25.0000 mg | ORAL_TABLET | Freq: Two times a day (BID) | ORAL | 1 refills | Status: DC

## 2023-02-04 MED ORDER — ESCITALOPRAM OXALATE 20 MG PO TABS: 20.0000 mg | ORAL_TABLET | Freq: Every day | ORAL | 1 refills | Status: DC

## 2023-02-04 MED ORDER — ATORVASTATIN CALCIUM 20 MG PO TABS: 20.0000 mg | ORAL_TABLET | Freq: Every day | ORAL | 3 refills | Status: DC

## 2023-02-04 MED ORDER — FUROSEMIDE 20 MG PO TABS: ORAL_TABLET | ORAL | 1 refills | Status: DC

## 2023-02-04 MED ORDER — PAROXETINE HCL 20 MG PO TABS: ORAL_TABLET | ORAL | 1 refills | Status: DC

## 2023-02-04 MED ORDER — TRAZODONE HCL 100 MG PO TABS: ORAL_TABLET | ORAL | 1 refills | Status: DC

## 2023-02-04 MED ORDER — POTASSIUM CHLORIDE CRYS ER 20 MEQ PO TBCR: EXTENDED_RELEASE_TABLET | ORAL | 5 refills | Status: DC

## 2023-02-04 MED ORDER — AMLODIPINE BESYLATE 10 MG PO TABS: 10.0000 mg | ORAL_TABLET | Freq: Every day | ORAL | 1 refills | Status: DC

## 2023-02-04 MED ORDER — EZETIMIBE 10 MG PO TABS: ORAL_TABLET | ORAL | 3 refills | Status: DC

## 2023-02-04 MED ORDER — BENAZEPRIL HCL 20 MG PO TABS: ORAL_TABLET | ORAL | 1 refills | Status: DC

## 2023-02-04 NOTE — Progress Notes (Signed)
Jennifer House , 1945/10/02, 77 y.o., female MRN: [id number] Patient Care Team    Relationship Specialty Notifications Start End  Natalia Leatherwood, DO PCP - General Family Medicine  01/23/15   Hillis Range, MD (Inactive) Attending Physician Cardiology  07/25/11   Bernette Redbird, MD Consulting Physician Gastroenterology  08/25/15   Barron Alvine, MD (Inactive) Consulting Physician Urology  08/25/15   Tanya Nones, OD  Optometry  08/25/15    Comment: "Jennifer House" at [facility]  Lanier Prude, MD Consulting Physician Cardiology  08/06/21     Chief Complaint  Patient presents with   Hypertension    CMC;pt is fasting     Subjective:Jennifer House is a 77 y.o. female present for chronic condition management.All past medical history, surgical history, allergies, family history, immunizations and social history was obtained from the patient today and entered into the electronic medical record. Hypertension/hyperlipidemia/hypertriglyceridemia/A. fib: Pt reports compliance with Benzapril 30 mg, amlodipine 10 mg, metoprolol 25 mg twice a day, Lasix 20 mg (MWF) today.  Patient denies chest pain, shortness of breath, dizziness or lower extremity edema.  Pt is on warfarin therapy per cardiology. Pt is prescribed Zetia and tolerating statin.  Diet: Low-sodium Exercise: Tries to exercise  RF: Hypertension, hyperlipidemia, family history of heart disease and stroke, personal history A. Fib, obesity, former smoker  Depression/insomnia: Patient reports compliance with Paxil 20 mg, Lexapro 20 mg and trazodone 100 mg .  She feels this regimen is working well for her.  Her husband passed away 12-20-20 and her daughter has been helping her/emotional support.      Encounter for chronic pain/lumbar arthritis: tramadol was not effective.  Vicodin is working well for her and helping her remain active.  She does not routinely take the daytime dose, but does take a few times a week.  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 Pain contract signed (Y/N): Y Date narcotic database last reviewed (include red flags): 02/04/23       02/04/2023    9:16 AM 08/21/2022   11:05 AM 02/27/2022    8:35 AM 09/26/2021    8:06 AM 04/25/2021   10:27 AM  Depression screen PHQ 2/9  Decreased Interest 1 1 1  0 0  Down, Depressed, Hopeless 0 1 0 0 0  PHQ - 2 Score 1 2 1  0 0  Altered sleeping 0 2 1  0  Tired, decreased energy 1 2 0  0  Change in appetite 0 0 0  0  Feeling bad or failure about yourself  3 0 0  0  Trouble concentrating 0 0 0  0  Moving slowly or fidgety/restless 0 0 0  0  Suicidal thoughts 0 0 0  0  PHQ-9 Score 5 6 2   0  Difficult doing work/chores Not difficult at all        Allergies  Allergen Reactions   Ibandronate Sodium Other (See Comments)    Bones hurt   Pravachol [Pravastatin Sodium] Other (See Comments)    Myalgias    Nsaids     anticoagulated   Ultram [Tramadol Hcl] Nausea Only   Social History   Social History Narrative   Lives in [place], with [place].  Has 4 adult children , highest level of education is seventh grade.   Former smoker, denies recreational drugs or alcohol use.   Patient drink caffeine beverages, takes a daily vitamin   Patient was her seatbelt , she has dentures , she has a smoke detector in the home , there are guns in the home a  Locked case    Patient feels safe in her relationships       Past Medical History:  Diagnosis Date   AKI (acute kidney injury) (HCC) 10/19/2020   Anxiety    Atrial fibrillation (HCC)    longstanding persistent   Carcinoma of bladder (HCC) 2000   transitional cell hx; Dr. Isabel Caprice   Closed displaced fracture of left femoral neck (HCC)  08/28/2017   Colon polyps    Depression    GERD (gastroesophageal reflux disease)    Headache(784.0)    hx of migraines   Hematuria    hx   HLD (hyperlipidemia)    mixed   HTN (hypertension)    Hypothyroidism    Insomnia    Liver function test abnormality    hx fatty liver   Lobar pneumonia (HCC) 10/19/2020   Medicare annual wellness visit, subsequent 08/25/2015   Murmur, cardiac    Nonalcoholic fatty liver disease    Obesity    Osteoarthritis    low back pain with left sciatica   Osteopenia    dexa 2013   Renal calculus    hx   RENAL CALCULUS, HX OF 01/31/2009   Qualifier: Diagnosis of  By: Susette Racer CMA, Jewel     Restless leg syndrome    Past Surgical History:  Procedure Laterality Date   APPENDECTOMY     CARDIAC CATHETERIZATION  1/12   revealed normal cors and LV function   COLONOSCOPY     cystocopy  10/17/03   left stent placement, TURBT   FOOT SURGERY     LUMBAR LAMINECTOMY/DECOMPRESSION MICRODISCECTOMY N/A 01/19/2013   Procedure: LUMBAR THREE FOUR LUMBAR LAMINECTOMY/DECOMPRESSION MICRODISCECTOMY 1 LEVEL;  Surgeon: Reinaldo Meeker, MD;  Location: [facility];  Service: Neurosurgery;  Laterality: N/A;   TOTAL ABDOMINAL HYSTERECTOMY  1985   TOTAL HIP ARTHROPLASTY Left 08/28/2017   Procedure: TOTAL HIP ARTHROPLASTY ANTERIOR APPROACH;  Surgeon: Samson Frederic, MD;  Location: [facility];  Service: Orthopedics;  Laterality: Left;   Family History  Problem Relation Age of Onset   Heart failure Mother    Heart attack Mother    Hypertension Mother    Thyroid cancer Mother    Stroke Father    Hypertension Father    Stroke Brother    Breast cancer Neg Hx    Colon cancer Neg Hx    Allergies as of 02/04/2023       Reactions   Ibandronate Sodium Other (See Comments)   Bones hurt   Pravachol [pravastatin Sodium] Other (See Comments)   Myalgias   Nsaids    anticoagulated   Ultram [tramadol Hcl] Nausea Only        Medication List        Accurate as of February 04, 2023  12:05 PM. If you have any questions, ask your nurse or doctor.          amLODipine 10 MG tablet Commonly known as: NORVASC Take 1 tablet (10 mg total) by mouth daily.   atorvastatin 20 MG tablet Commonly known as: LIPITOR Take 1 tablet (20 mg total) by mouth daily.   benazepril 20 MG tablet Commonly known  as: LOTENSIN TAKE 1 AND 1/2 TABLETS(30 MG) BY MOUTH DAILY   calcium carbonate 1500 (600 Ca) MG Tabs tablet Commonly known as: OSCAL Take 3 tablets by mouth daily.   Centrum Silver tablet Take 1 tablet by mouth daily.   cholecalciferol 1000 units tablet Commonly known as: VITAMIN D Take 1,000 Units by mouth daily.   cyanocobalamin 1000 MCG tablet Commonly known as: VITAMIN B12 Take 1,000 mcg by mouth daily.   escitalopram 20 MG tablet Commonly known as: LEXAPRO Take 1 tablet (20 mg total) by mouth daily.   ezetimibe 10 MG tablet Commonly known as: ZETIA TAKE 1 TABLET(10 MG) BY MOUTH DAILY   furosemide 20 MG tablet Commonly known as: LASIX TAKE 1 TABLET BY MOUTH EVERY MONDAY, WEDNESDAY AND FRIDAY   gabapentin 100 MG capsule Commonly known as: NEURONTIN 1 cap in morning and 2 caps at night   HYDROcodone-acetaminophen 5-325 MG tablet Commonly known as: NORCO/VICODIN Take 1 tablet by mouth 2 (two) times daily as needed for moderate pain (pain score 4-6). What changed: Another medication with the same name was added. Make sure you understand how and when to take each. Changed by: Felix Pacini   HYDROcodone-acetaminophen 5-325 MG tablet Commonly known as: NORCO/VICODIN Take 1 tablet by mouth 2 (two) times daily as needed for moderate pain (pain score 4-6). Start taking on: April 28, 2023 What changed: You were already taking a medication with the same name, and this prescription was added. Make sure you understand how and when to take each. Changed by: Felix Pacini   levothyroxine 75 MCG tablet Commonly known as: SYNTHROID TAKE 1 TABLET(75 MCG) BY MOUTH  DAILY. Monday- Saturday. 1.5 tabs on Sunday.   metoprolol tartrate 50 MG tablet Commonly known as: LOPRESSOR Take 0.5 tablets (25 mg total) by mouth 2 (two) times daily.   nitroGLYCERIN 0.4 MG SL tablet Commonly known as: NITROSTAT PLACE 1 TABLET UNDER THE TONGUE EVERY 5 MINUTES AS NEEDED FOR CHEST PAIN,(MAX 3 TABLETS)   PARoxetine 20 MG tablet Commonly known as: PAXIL TAKE 1 TABLET(20 MG) BY MOUTH DAILY   potassium chloride SA 20 MEQ tablet Commonly known as: KLOR-CON M 1 tab p.o. on days taking Lasix   traZODone 100 MG tablet Commonly known as: DESYREL TAKE 1 TABLET(100 MG) BY MOUTH AT BEDTIME   VITAMIN C PO Take by mouth.   warfarin 5 MG tablet Commonly known as: COUMADIN Take as directed by the anticoagulation clinic. If you are unsure how to take this medication, talk to your nurse or doctor. Original instructions: TAKE 1 TABLET BY MOUTH DAILY AS DIRECTED BY COUMADIN CLINIC        All past medical history, surgical history, allergies, family history, immunizations andmedications were updated in the EMR today and reviewed under the history and medication portions of their EMR.     ROS: Negative, with the exception of above mentioned in HPI   Objective:  BP 118/80   Pulse 65   Temp (!) 97.5 F (36.4 C)   Wt 201 lb 3.2 oz (91.3 kg)   SpO2 98%   BMI 35.64 kg/m  Body mass index is 35.64 kg/m. Physical Exam Vitals and nursing note reviewed.  Constitutional:      General: She is not in acute distress.    Appearance: Normal appearance. She is not ill-appearing, toxic-appearing or diaphoretic.  HENT:     Head: Normocephalic and atraumatic.     Mouth/Throat:     Mouth: Mucous membranes are moist.  Eyes:  General: No scleral icterus.       Right eye: No discharge.        Left eye: No discharge.     Extraocular Movements: Extraocular movements intact.     Conjunctiva/sclera: Conjunctivae normal.     Pupils: Pupils are equal, round, and reactive to light.   Cardiovascular:     Rate and Rhythm: Normal rate and regular rhythm.     Heart sounds: No murmur heard. Pulmonary:     Effort: Pulmonary effort is normal. No respiratory distress.     Breath sounds: Normal breath sounds. No wheezing, rhonchi or rales.  Abdominal:     General: Bowel sounds are normal. There is no distension.     Palpations: Abdomen is soft.     Tenderness: There is no abdominal tenderness. There is no right CVA tenderness, left CVA tenderness or rebound.  Musculoskeletal:     Right lower leg: No edema.     Left lower leg: No edema.  Skin:    General: Skin is warm.     Findings: No rash.  Neurological:     Mental Status: She is alert and oriented to person, place, and time. Mental status is at baseline.     Motor: No weakness.     Gait: Gait normal.  Psychiatric:        Mood and Affect: Mood normal.        Behavior: Behavior normal.        Thought Content: Thought content normal.        Judgment: Judgment normal.      Assessment/Plan: Jennifer House is a 77 y.o. female present for OV for chronic medical conditions management Arthritis, lumbar spine/Osteoarthritis of thoracic spine, unspecified spinal osteoarthritis complication status/Spondylolisthesis at L3-L4 level/Encounter for chronic pain/morbid obesity Stable Medication has increased her quality of life and is able to keep her active.  - Chronic pain script: continue  hydrocodone 5-325 mg BID PRn  #180 supplied.  - UDS-UTD - contract signed  - [place] controlled substance database reviewed 02/04/23 -Continue gabapentin 100 mg 1 tab morning and 2 tabs before bed. - she is tolerating.  - Continue Vicodin 5-325 twice daily as needed x 2 scripts   Essential hypertension, benign/hyperlipidemia/A. Fib/ obesity/Atrial fibrillation, unspecified type (HCC)/Aortic atherosclerosis (HCC)/acquired thrombophilia/morbid obesity Stable Continue benzapril to 30 mg daily Continue amlodipine 10  Continue  Lasix MWF Continue metoprolol 25 BID  Continue Zetia 10 mg daily Continue statin- tolerating.  - Continue follow-up with cardiology for atrial fibrillation and coumadin management - low salt diet, increase exercise.  Lipid, tsh, CBC and CMP collected today  . Hypothyroid: Continue levothyroxine daily, with an extra half a tablet on Sundays.refills will be provided in appropriate dose based on lab result today TSH collected today Hypocalcemia/Osteopenia, unspecified location/hyperparathyroidism - takes vit d 1000u daily. Last dexa 2013 - under the care of  endocrine.  -Vitamin D levels up-to-date  Insomnia, unspecified type/ Moderate episode of recurrent major depressive disorder (HCC) Stable Continue trazodone 100 mg nightly Continue lexapro 20 mg daily Continue Paxil to 20 mg daily   Return in about 24 weeks (around 07/22/2023) for Routine chronic condition follow-up.  Reviewed expectations re: course of current medical issues. Discussed self-management of symptoms. Outlined signs and symptoms indicating need for more acute intervention. Patient verbalized understanding and all questions were answered. Patient received an After-Visit Summary.  52 minutes spent during patient encounter covering multiple chronic conditions and new hematuria in a patient with  a history of bladder cancer.  Orders Placed This Encounter  Procedures   Flu Vaccine Trivalent High Dose (Fluad)   Lipid panel   Comprehensive metabolic panel   TSH   CBC   Hemoglobin A1c    Meds ordered this encounter  Medications   amLODipine (NORVASC) 10 MG tablet    Sig: Take 1 tablet (10 mg total) by mouth daily.    Dispense:  90 tablet    Refill:  1   atorvastatin (LIPITOR) 20 MG tablet    Sig: Take 1 tablet (20 mg total) by mouth daily.    Dispense:  90 tablet    Refill:  3   benazepril (LOTENSIN) 20 MG tablet    Sig: TAKE 1 AND 1/2 TABLETS(30 MG) BY MOUTH DAILY    Dispense:  135 tablet     Refill:  1   escitalopram (LEXAPRO) 20 MG tablet    Sig: Take 1 tablet (20 mg total) by mouth daily.    Dispense:  90 tablet    Refill:  1   ezetimibe (ZETIA) 10 MG tablet    Sig: TAKE 1 TABLET(10 MG) BY MOUTH DAILY    Dispense:  90 tablet    Refill:  3   furosemide (LASIX) 20 MG tablet    Sig: TAKE 1 TABLET BY MOUTH EVERY MONDAY, WEDNESDAY AND FRIDAY    Dispense:  45 tablet    Refill:  1   gabapentin (NEURONTIN) 100 MG capsule    Sig: 1 cap in morning and 2 caps at night    Dispense:  270 capsule    Refill:  5   HYDROcodone-acetaminophen (NORCO/VICODIN) 5-325 MG tablet    Sig: Take 1 tablet by mouth 2 (two) times daily as needed for moderate pain (pain score 4-6).    Dispense:  180 tablet    Refill:  0   metoprolol tartrate (LOPRESSOR) 50 MG tablet    Sig: Take 0.5 tablets (25 mg total) by mouth 2 (two) times daily.    Dispense:  90 tablet    Refill:  1   PARoxetine (PAXIL) 20 MG tablet    Sig: TAKE 1 TABLET(20 MG) BY MOUTH DAILY    Dispense:  90 tablet    Refill:  1   potassium chloride SA (KLOR-CON M) 20 MEQ tablet    Sig: 1 tab p.o. on days taking Lasix    Dispense:  30 tablet    Refill:  5   traZODone (DESYREL) 100 MG tablet    Sig: TAKE 1 TABLET(100 MG) BY MOUTH AT BEDTIME    Dispense:  90 tablet    Refill:  1   HYDROcodone-acetaminophen (NORCO/VICODIN) 5-325 MG tablet    Sig: Take 1 tablet by mouth 2 (two) times daily as needed for moderate pain (pain score 4-6).    Dispense:  180 tablet    Refill:  0      Note is dictated utilizing voice recognition software. Although note has been proof read prior to signing, occasional typographical errors still can be missed. If any questions arise, please do not hesitate to call for verification.   electronically signed by:  Felix Pacini, DO  [facility] [place]

## 2023-02-04 NOTE — Patient Instructions (Addendum)

## 2023-02-05 ENCOUNTER — Other Ambulatory Visit: Payer: Self-pay | Admitting: Family Medicine

## 2023-02-05 MED ORDER — LEVOTHYROXINE SODIUM 75 MCG PO TABS: ORAL_TABLET | ORAL | 3 refills | Status: DC

## 2023-02-09 NOTE — Progress Notes (Unsigned)
Cardiology Office Note Date:  02/09/2023  Patient ID:  Jennifer House, Croan Jan 07, 1946, MRN [id number] PCP:  Natalia Leatherwood, DO  Electrophysiologist: Dr. Johney Frame    Chief Complaint:  6 mo, overdue  History of Present Illness: Jennifer House is a 77 y.o. female with history of HTN, HLD, hypothyroidism, obesity, and permanent Afib  She comes today to be seen for Dr. Johney Frame, last seen by him via tele health 08/31/2018, at that time doing OK, no changes were made, planned for annual visit  She had an ER visit 10/12/20 after a fall, seems without significant injuray and discharged from the ER.  There was suspect degree of dementia with poor short term memory, though son denied this as a known dx.  Was found with a pneumonia, EKg was rate controlled AF Saw her PMD 10/19/20 with ongoing management of her pneumonia I do not see a specific discussion regarding dementia or not  I saw her 12/18/20 She comes today accompanied by her daughter. She is doing well, recovered from her UTI/pneumonia.  No trouble with falls, they report she is steady on her feet.  No CP, palpitations or cardiac awareness. No dizzy spells, near syncope or syncope. No bleeding or signs of bleeding Warfarin is managed here, her PMD manages and monitors her labs otherwise She was in rate controlled AFib without symptoms No changes were made, thoughts towards perhaps updating her echo next visit if needed/indicated Lipids were managed with her PMD Planned for an annual visit  I saw her 07/16/21: She is accompanied by her grand daughter today Her husband died 7 mo ago, since then her grand daughter has noticed she is not as active, not using her stationary bike the way she used to, less engaged perhaps. They have a trip coming up to [place] that she is looking forward to. The patient does say that she misses her husband very much and since his death, not as active, though not for any particular reason otherwise. Her  grand daughter noticed 2 weeks ago she was quite swollen, especially her R foot/ankle though was on both sides. A week or so ago when shopping she felt like her legs were weak. The patiient is clear though she was not SOB, is not having any CP, palpitations. But her legs feel more weak. NO near syncope or syncope No bleeding or signs of bleeding INR today 3.8 Edema has nearly resolved without any particular intervention No extra lasix uses. And no particular trigger for the edema, dietary changes, no missed meds. Suspected symptoms 2/2 sedentary lifestyle Planned to update her echo, prn lasix, labs,  encouraged activity, to f/u 4 mo  Echo with LVEF 55-60%, no WMA  I saw her 05/13/22 She is accompanied by her grand daughter She is doing well Denies any CP, palpitations, SOB, DOE. Remains mostly sedentary, runs some errands, some house work, no exercise No dizzy spells, near syncope or syncope. No edema No bleeding or signs of bleeding  *** warfarin, bleeding, [place] *** symptoms, rate *** edema?   Past Medical History:  Diagnosis Date   AKI (acute kidney injury) (HCC) 10/19/2020   Anxiety    Atrial fibrillation (HCC)    longstanding persistent   Carcinoma of bladder (HCC) 2000   transitional cell hx; Dr. Isabel Caprice   Closed displaced fracture of left femoral neck (HCC) 08/28/2017   Colon polyps    Depression    GERD (gastroesophageal reflux disease)    Headache(784.0)    hx  of migraines   Hematuria    hx   HLD (hyperlipidemia)    mixed   HTN (hypertension)    Hypothyroidism    Insomnia    Liver function test abnormality    hx fatty liver   Lobar pneumonia (HCC) 10/19/2020   Medicare annual wellness visit, subsequent 08/25/2015   Murmur, cardiac    Nonalcoholic fatty liver disease    Obesity    Osteoarthritis    low back pain with left sciatica   Osteopenia    dexa 2013   Renal calculus    hx   RENAL CALCULUS, HX OF 01/31/2009   Qualifier: Diagnosis of  By: Susette Racer CMA,  Jewel     Restless leg syndrome     Past Surgical History:  Procedure Laterality Date   APPENDECTOMY     CARDIAC CATHETERIZATION  1/12   revealed normal cors and LV function   COLONOSCOPY     cystocopy  10/17/03   left stent placement, TURBT   FOOT SURGERY     LUMBAR LAMINECTOMY/DECOMPRESSION MICRODISCECTOMY N/A 01/19/2013   Procedure: LUMBAR THREE FOUR LUMBAR LAMINECTOMY/DECOMPRESSION MICRODISCECTOMY 1 LEVEL;  Surgeon: Reinaldo Meeker, MD;  Location: [facility];  Service: Neurosurgery;  Laterality: N/A;   TOTAL ABDOMINAL HYSTERECTOMY  1985   TOTAL HIP ARTHROPLASTY Left 08/28/2017   Procedure: TOTAL HIP ARTHROPLASTY ANTERIOR APPROACH;  Surgeon: Samson Frederic, MD;  Location: [facility];  Service: Orthopedics;  Laterality: Left;    Current Outpatient Medications  Medication Sig Dispense Refill   amLODipine (NORVASC) 10 MG tablet Take 1 tablet (10 mg total) by mouth daily. 90 tablet 1   Ascorbic Acid (VITAMIN C PO) Take by mouth.     atorvastatin (LIPITOR) 20 MG tablet Take 1 tablet (20 mg total) by mouth daily. 90 tablet 3   benazepril (LOTENSIN) 20 MG tablet TAKE 1 AND 1/2 TABLETS(30 MG) BY MOUTH DAILY 135 tablet 1   calcium carbonate (OSCAL) 1500 (600 Ca) MG TABS tablet Take 3 tablets by mouth daily.     cholecalciferol (VITAMIN D) 1000 UNITS tablet Take 1,000 Units by mouth daily.     escitalopram (LEXAPRO) 20 MG tablet Take 1 tablet (20 mg total) by mouth daily. 90 tablet 1   ezetimibe (ZETIA) 10 MG tablet TAKE 1 TABLET(10 MG) BY MOUTH DAILY 90 tablet 3   furosemide (LASIX) 20 MG tablet TAKE 1 TABLET BY MOUTH EVERY MONDAY, WEDNESDAY AND FRIDAY 45 tablet 1   gabapentin (NEURONTIN) 100 MG capsule 1 cap in morning and 2 caps at night 270 capsule 5   HYDROcodone-acetaminophen (NORCO/VICODIN) 5-325 MG tablet Take 1 tablet by mouth 2 (two) times daily as needed for moderate pain (pain score 4-6). 180 tablet 0   [START ON 04/28/2023] HYDROcodone-acetaminophen (NORCO/VICODIN) 5-325 MG tablet  Take 1 tablet by mouth 2 (two) times daily as needed for moderate pain (pain score 4-6). 180 tablet 0   levothyroxine (SYNTHROID) 75 MCG tablet TAKE 1 TABLET(75 MCG) BY MOUTH DAILY. Monday- Saturday. 1.5 tabs on Sunday. 96 tablet 3   metoprolol tartrate (LOPRESSOR) 50 MG tablet Take 0.5 tablets (25 mg total) by mouth 2 (two) times daily. 90 tablet 1   Multiple Vitamins-Minerals (CENTRUM SILVER) tablet Take 1 tablet by mouth daily.     nitroGLYCERIN (NITROSTAT) 0.4 MG SL tablet PLACE 1 TABLET UNDER THE TONGUE EVERY 5 MINUTES AS NEEDED FOR CHEST PAIN,(MAX 3 TABLETS) 25 tablet 0   PARoxetine (PAXIL) 20 MG tablet TAKE 1 TABLET(20 MG) BY MOUTH DAILY 90  tablet 1   potassium chloride SA (KLOR-CON M) 20 MEQ tablet 1 tab p.o. on days taking Lasix 30 tablet 5   traZODone (DESYREL) 100 MG tablet TAKE 1 TABLET(100 MG) BY MOUTH AT BEDTIME 90 tablet 1   vitamin B-12 (CYANOCOBALAMIN) 1000 MCG tablet Take 1,000 mcg by mouth daily.     warfarin (COUMADIN) 5 MG tablet TAKE 1 TABLET BY MOUTH DAILY AS DIRECTED BY COUMADIN CLINIC 90 tablet 1   No current facility-administered medications for this visit.    Allergies:   Ibandronate sodium, Pravachol [pravastatin sodium], Nsaids, and Ultram [tramadol hcl]   Social History:  The patient  reports that she quit smoking about 37 years ago. Her smoking use included cigarettes. She has never used smokeless tobacco. She reports that she does not drink alcohol and does not use drugs.   Family History:  The patient's family history includes Heart attack in her mother; Heart failure in her mother; Hypertension in her father and mother; Stroke in her brother and father; Thyroid cancer in her mother.  ROS:  Please see the history of present illness.    All other systems are reviewed and otherwise negative.   PHYSICAL EXAM:  VS:  There were no vitals taken for this visit. BMI: There is no height or weight on file to calculate BMI. Well nourished, well developed, in no acute  distress HEENT: normocephalic, atraumatic Neck: no JVD, carotid bruits or masses Cardiac: *** irreg-irreg; no significant murmurs, no rubs, or gallops Lungs:  ***CTA b/l, no wheezing, rhonchi or rales Abd: soft, nontender MS: no deformity or atrophy Ext: *** no edema R, none otherwise Skin: warm and dry, no rash Neuro:  No gross deficits appreciated Psych: euthymic mood, full affect    EKG: not done today   08/07/21: TTE  1. Left ventricular ejection fraction, by estimation, is 55 to 60%. The  left ventricle has normal function. The left ventricle has no regional  wall motion abnormalities. Left ventricular diastolic function could not  be evaluated.   2. Right ventricular systolic function is normal. The right ventricular  size is normal. There is normal pulmonary artery systolic pressure.   3. Left atrial size was severely dilated.   4. Right atrial size was severely dilated.   5. The mitral valve is normal in structure. Mild mitral valve  regurgitation. No evidence of mitral stenosis.   6. Tricuspid valve regurgitation is moderate.   7. The aortic valve is tricuspid. Aortic valve regurgitation is mild. No  aortic stenosis is present.   8. The inferior vena cava is normal in size with greater than 50%  respiratory variability, suggesting right atrial pressure of 3 mmHg.   08/26/2017: TTE Study Conclusions  - Left ventricle: The cavity size was normal. Wall thickness was    normal. Systolic function was normal. The estimated ejection    fraction was in the range of 60% to 65%.  - Aortic valve: There was trivial regurgitation.  - Mitral valve: Calcified annulus.  - Left atrium: The atrium was moderately to severely dilated.  - Right atrium: The atrium was moderately to severely dilated.  - Pulmonary arteries: PA peak pressure: 32 mm Hg (S).  - Pericardium, extracardiac: A trivial pericardial effusion was    identified.     Recent Labs: 02/04/2023: ALT 10; BUN 12;  Creatinine, Ser 0.87; Hemoglobin 13.3; Platelets 184.0; Potassium 4.7; Sodium 140; TSH 1.27  02/04/2023: Cholesterol 126; HDL 43.40; LDL Cholesterol 51; Total CHOL/HDL Ratio 3; Triglycerides  161.0; VLDL 32.2   CrCl cannot be calculated (Unknown ideal weight.).   Wt Readings from Last 3 Encounters:  02/04/23 201 lb 3.2 oz (91.3 kg)  08/21/22 206 lb 6.4 oz (93.6 kg)  05/13/22 204 lb 9.6 oz (92.8 kg)     Other studies reviewed: Additional studies/records reviewed today include: summarized above  ASSESSMENT AND PLAN:  Permanent Afib CHA2DS2Vasc is 4, on warfarin, *** monitored and managed by our coumadin clinic  *** Rate controlled No cardiac awareness  HTN *** Looks good  HLD Lipids are monitored and managed with her PMD  4. Edema *** Resolved  5. Secondary hypercoagulable state   Encouraged activity to her ability *** Discussed need for new EP MD, will transition to Dr. Elberta Fortis, they are agreeable   Disposition: *** we will see her back in 27mo, sooner if needed  Current medicines are reviewed at length with the patient today.  The patient did not have any concerns regarding medicines.  Norma Fredrickson, PA-C 02/09/2023 9:51 AM     [facility] [address] [place] [address] [phone] (office)  [phone] (fax)

## 2023-02-11 ENCOUNTER — Encounter: Payer: Self-pay | Admitting: Physician Assistant

## 2023-02-11 ENCOUNTER — Ambulatory Visit: Payer: Medicare Other | Attending: Cardiology | Admitting: Physician Assistant

## 2023-02-11 VITALS — BP 120/70 | HR 59 | Ht 63.0 in | Wt 203.6 lb

## 2023-02-11 DIAGNOSIS — I4821 Permanent atrial fibrillation: Secondary | ICD-10-CM

## 2023-02-11 DIAGNOSIS — D6869 Other thrombophilia: Secondary | ICD-10-CM

## 2023-02-11 DIAGNOSIS — I10 Essential (primary) hypertension: Secondary | ICD-10-CM

## 2023-02-11 NOTE — Patient Instructions (Signed)
Medication Instructions:  No changes *If you need a refill on your cardiac medications before your next appointment, please call your pharmacy*   Lab Work: none If you have labs (blood work) drawn today and your tests are completely normal, you will receive your results only by: MyChart Message (if you have MyChart) OR A paper copy in the mail If you have any lab test that is abnormal or we need to change your treatment, we will call you to review the results.   Testing/Procedures: none   Follow-Up: At [facility], you and your health needs are our priority.  As part of our continuing mission to provide you with exceptional heart care, we have created designated Provider Care Teams.  These Care Teams include your primary Cardiologist (physician) and Advanced Practice Providers (APPs -  Physician Assistants and Nurse Practitioners) who all work together to provide you with the care you need, when you need it.  Your next appointment:   6 month(s)  Provider:   Nobie Putnam, MD

## 2023-02-19 ENCOUNTER — Ambulatory Visit: Payer: Medicare Other | Attending: Cardiovascular Disease | Admitting: *Deleted

## 2023-02-19 DIAGNOSIS — Z5181 Encounter for therapeutic drug level monitoring: Secondary | ICD-10-CM

## 2023-02-19 DIAGNOSIS — I4891 Unspecified atrial fibrillation: Secondary | ICD-10-CM

## 2023-02-19 LAB — POCT INR: INR: 2.2 (ref 2.0–3.0)

## 2023-02-19 NOTE — Patient Instructions (Signed)
  Description   Continue taking Warfarin 1 tablet everyday except 1/2 tablet on Sundays.  Stay consistent with greens (3 per week).  Recheck INR in 6 weeks.  [phone] or [phone] Coumadin clinic. Main # [phone].

## 2023-02-26 ENCOUNTER — Ambulatory Visit: Payer: Medicare Other | Admitting: *Deleted

## 2023-02-26 DIAGNOSIS — Z0000 Encounter for general adult medical examination without abnormal findings: Secondary | ICD-10-CM | POA: Diagnosis not present

## 2023-02-26 NOTE — Patient Instructions (Signed)
Jennifer House , Thank you for taking time to come for your Medicare Wellness Visit. I appreciate your ongoing commitment to your health goals. Please review the following plan we discussed and let me know if I can assist you in the future.   Screening recommendations/referrals: Colonoscopy: no longer required Mammogram: no longer required Bone Density: up to date Recommended yearly ophthalmology/optometry visit for glaucoma screening and checkup Recommended yearly dental visit for hygiene and checkup  Vaccinations: Influenza vaccine: up to date Pneumococcal vaccine: up to date Tdap vaccine: up to date     Preventive Care 65 Years and Older, Female Preventive care refers to lifestyle choices and visits with your health care provider that can promote health and wellness. What does preventive care include? A yearly physical exam. This is also called an annual well check. Dental exams once or twice a year. Routine eye exams. Ask your health care provider how often you should have your eyes checked. Personal lifestyle choices, including: Daily care of your teeth and gums. Regular physical activity. Eating a healthy diet. Avoiding tobacco and drug use. Limiting alcohol use. Practicing safe sex. Taking low-dose aspirin every day. Taking vitamin and mineral supplements as recommended by your health care provider. What happens during an annual well check? The services and screenings done by your health care provider during your annual well check will depend on your age, overall health, lifestyle risk factors, and family history of disease. Counseling  Your health care provider may ask you questions about your: Alcohol use. Tobacco use. Drug use. Emotional well-being. Home and relationship well-being. Sexual activity. Eating habits. History of falls. Memory and ability to understand (cognition). Work and work Astronomer. Reproductive health. Screening  You may have the following  tests or measurements: Height, weight, and BMI. Blood pressure. Lipid and cholesterol levels. These may be checked every 5 years, or more frequently if you are over 76 years old. Skin check. Lung cancer screening. You may have this screening every year starting at age 29 if you have a 30-pack-year history of smoking and currently smoke or have quit within the past 15 years. Fecal occult blood test (FOBT) of the stool. You may have this test every year starting at age 25. Flexible sigmoidoscopy or colonoscopy. You may have a sigmoidoscopy every 5 years or a colonoscopy every 10 years starting at age 37. Hepatitis C blood test. Hepatitis B blood test. Sexually transmitted disease (STD) testing. Diabetes screening. This is done by checking your blood sugar (glucose) after you have not eaten for a while (fasting). You may have this done every 1-3 years. Bone density scan. This is done to screen for osteoporosis. You may have this done starting at age 67. Mammogram. This may be done every 1-2 years. Talk to your health care provider about how often you should have regular mammograms. Talk with your health care provider about your test results, treatment options, and if necessary, the need for more tests. Vaccines  Your health care provider may recommend certain vaccines, such as: Influenza vaccine. This is recommended every year. Tetanus, diphtheria, and acellular pertussis (Tdap, Td) vaccine. You may need a Td booster every 10 years. Zoster vaccine. You may need this after age 61. Pneumococcal 13-valent conjugate (PCV13) vaccine. One dose is recommended after age 33. Pneumococcal polysaccharide (PPSV23) vaccine. One dose is recommended after age 53. Talk to your health care provider about which screenings and vaccines you need and how often you need them. This information is not intended to replace  advice given to you by your health care provider. Make sure you discuss any questions you have with  your health care provider. Document Released: 03/31/2015 Document Revised: 11/22/2015 Document Reviewed: 01/03/2015 Elsevier Interactive Patient Education  2017 [employer].  Fall Prevention in the Home Falls can cause injuries. They can happen to people of all ages. There are many things you can do to make your home safe and to help prevent falls. What can I do on the outside of my home? Regularly fix the edges of walkways and driveways and fix any cracks. Remove anything that might make you trip as you walk through a door, such as a raised step or threshold. Trim any bushes or trees on the path to your home. Use bright outdoor lighting. Clear any walking paths of anything that might make someone trip, such as rocks or tools. Regularly check to see if handrails are loose or broken. Make sure that both sides of any steps have handrails. Any raised decks and porches should have guardrails on the edges. Have any leaves, snow, or ice cleared regularly. Use sand or salt on walking paths during winter. Clean up any spills in your garage right away. This includes oil or grease spills. What can I do in the bathroom? Use night lights. Install grab bars by the toilet and in the tub and shower. Do not use towel bars as grab bars. Use non-skid mats or decals in the tub or shower. If you need to sit down in the shower, use a plastic, non-slip stool. Keep the floor dry. Clean up any water that spills on the floor as soon as it happens. Remove soap buildup in the tub or shower regularly. Attach bath mats securely with double-sided non-slip rug tape. Do not have throw rugs and other things on the floor that can make you trip. What can I do in the bedroom? Use night lights. Make sure that you have a light by your bed that is easy to reach. Do not use any sheets or blankets that are too big for your bed. They should not hang down onto the floor. Have a firm chair that has side arms. You can use this  for support while you get dressed. Do not have throw rugs and other things on the floor that can make you trip. What can I do in the kitchen? Clean up any spills right away. Avoid walking on wet floors. Keep items that you use a lot in easy-to-reach places. If you need to reach something above you, use a strong step stool that has a grab bar. Keep electrical cords out of the way. Do not use floor polish or wax that makes floors slippery. If you must use wax, use non-skid floor wax. Do not have throw rugs and other things on the floor that can make you trip. What can I do with my stairs? Do not leave any items on the stairs. Make sure that there are handrails on both sides of the stairs and use them. Fix handrails that are broken or loose. Make sure that handrails are as long as the stairways. Check any carpeting to make sure that it is firmly attached to the stairs. Fix any carpet that is loose or worn. Avoid having throw rugs at the top or bottom of the stairs. If you do have throw rugs, attach them to the floor with carpet tape. Make sure that you have a light switch at the top of the stairs and the bottom  of the stairs. If you do not have them, ask someone to add them for you. What else can I do to help prevent falls? Wear shoes that: Do not have high heels. Have rubber bottoms. Are comfortable and fit you well. Are closed at the toe. Do not wear sandals. If you use a stepladder: Make sure that it is fully opened. Do not climb a closed stepladder. Make sure that both sides of the stepladder are locked into place. Ask someone to hold it for you, if possible. Clearly mark and make sure that you can see: Any grab bars or handrails. First and last steps. Where the edge of each step is. Use tools that help you move around (mobility aids) if they are needed. These include: Canes. Walkers. Scooters. Crutches. Turn on the lights when you go into a dark area. Replace any light bulbs as  soon as they burn out. Set up your furniture so you have a clear path. Avoid moving your furniture around. If any of your floors are uneven, fix them. If there are any pets around you, be aware of where they are. Review your medicines with your doctor. Some medicines can make you feel dizzy. This can increase your chance of falling. Ask your doctor what other things that you can do to help prevent falls. This information is not intended to replace advice given to you by your health care provider. Make sure you discuss any questions you have with your health care provider. Document Released: 12/29/2008 Document Revised: 08/10/2015 Document Reviewed: 04/08/2014 Elsevier Interactive Patient Education  2017 [employer].

## 2023-02-26 NOTE — Progress Notes (Signed)
Subjective:   Jennifer House is a 77 y.o. female who presents for Medicare Annual (Subsequent) preventive examination.  Visit Complete: Virtual I connected with  Jennifer House on 02/26/23 by a audio enabled telemedicine application and verified that I am speaking with the correct person using two identifiers.  Patient Location: Home  Provider Location: Home Office  I discussed the limitations of evaluation and management by telemedicine. The patient expressed understanding and agreed to proceed.  Vital Signs: Because this visit was a virtual/telehealth visit, some criteria may be missing or patient reported. Any vitals not documented were not able to be obtained and vitals that have been documented are patient reported.  Cardiac Risk Factors include: advanced age (>13men, >48 women);hypertension     Objective:    There were no vitals filed for this visit. There is no height or weight on file to calculate BMI.     02/26/2023   10:57 AM 03/12/2022   10:14 AM 10/12/2020    9:59 AM 12/29/2019    2:20 PM 08/28/2017   12:49 PM 09/19/2016    9:54 AM 01/19/2013   10:37 AM  Advanced Directives  Does Patient Have a Medical Advance Directive? Yes Yes No No No No Patient does not have advance directive  Type of Advance Directive Healthcare Power of Attorney Living will       Does patient want to make changes to medical advance directive?  No - Patient declined       Copy of Healthcare Power of Attorney in Chart? Yes - validated most recent copy scanned in chart (See row information)        Would patient like information on creating a medical advance directive?    Yes (MAU/Ambulatory/Procedural Areas - Information given) No - Patient declined Yes (MAU/Ambulatory/Procedural Areas - Information given) Advance directive packet given  Pre-existing out of facility DNR order (yellow form or pink MOST form)       No    Current Medications (verified) Outpatient Encounter Medications as  of 02/26/2023  Medication Sig   amLODipine (NORVASC) 10 MG tablet Take 1 tablet (10 mg total) by mouth daily.   Ascorbic Acid (VITAMIN C PO) Take by mouth.   atorvastatin (LIPITOR) 20 MG tablet Take 1 tablet (20 mg total) by mouth daily.   benazepril (LOTENSIN) 20 MG tablet TAKE 1 AND 1/2 TABLETS(30 MG) BY MOUTH DAILY   calcium carbonate (OSCAL) 1500 (600 Ca) MG TABS tablet Take 3 tablets by mouth daily.   cholecalciferol (VITAMIN D) 1000 UNITS tablet Take 1,000 Units by mouth daily.   escitalopram (LEXAPRO) 20 MG tablet Take 1 tablet (20 mg total) by mouth daily.   ezetimibe (ZETIA) 10 MG tablet TAKE 1 TABLET(10 MG) BY MOUTH DAILY   furosemide (LASIX) 20 MG tablet TAKE 1 TABLET BY MOUTH EVERY MONDAY, WEDNESDAY AND FRIDAY   gabapentin (NEURONTIN) 100 MG capsule 1 cap in morning and 2 caps at night   HYDROcodone-acetaminophen (NORCO/VICODIN) 5-325 MG tablet Take 1 tablet by mouth 2 (two) times daily as needed for moderate pain (pain score 4-6).   levothyroxine (SYNTHROID) 75 MCG tablet TAKE 1 TABLET(75 MCG) BY MOUTH DAILY. Monday- Saturday. 1.5 tabs on Sunday.   metoprolol tartrate (LOPRESSOR) 50 MG tablet Take 0.5 tablets (25 mg total) by mouth 2 (two) times daily.   Multiple Vitamins-Minerals (CENTRUM SILVER) tablet Take 1 tablet by mouth daily.   nitroGLYCERIN (NITROSTAT) 0.4 MG SL tablet PLACE 1 TABLET UNDER THE TONGUE EVERY 5 MINUTES AS  NEEDED FOR CHEST PAIN,(MAX 3 TABLETS)   PARoxetine (PAXIL) 20 MG tablet TAKE 1 TABLET(20 MG) BY MOUTH DAILY   potassium chloride SA (KLOR-CON M) 20 MEQ tablet 1 tab p.o. on days taking Lasix   traZODone (DESYREL) 100 MG tablet TAKE 1 TABLET(100 MG) BY MOUTH AT BEDTIME   vitamin B-12 (CYANOCOBALAMIN) 1000 MCG tablet Take 1,000 mcg by mouth daily.   warfarin (COUMADIN) 5 MG tablet TAKE 1 TABLET BY MOUTH DAILY AS DIRECTED BY COUMADIN CLINIC   [START ON 04/28/2023] HYDROcodone-acetaminophen (NORCO/VICODIN) 5-325 MG tablet Take 1 tablet by mouth 2 (two) times  daily as needed for moderate pain (pain score 4-6). (Patient not taking: Reported on 02/26/2023)   No facility-administered encounter medications on file as of 02/26/2023.    Allergies (verified) Ibandronate sodium, Pravachol [pravastatin sodium], Nsaids, and Ultram [tramadol hcl]   History: Past Medical History:  Diagnosis Date   AKI (acute kidney injury) (HCC) 10/19/2020   Anxiety    Atrial fibrillation (HCC)    longstanding persistent   Carcinoma of bladder (HCC) 2000   transitional cell hx; Dr. Isabel Caprice   Closed displaced fracture of left femoral neck (HCC) 08/28/2017   Colon polyps    Depression    GERD (gastroesophageal reflux disease)    Headache(784.0)    hx of migraines   Hematuria    hx   HLD (hyperlipidemia)    mixed   HTN (hypertension)    Hypothyroidism    Insomnia    Liver function test abnormality    hx fatty liver   Lobar pneumonia (HCC) 10/19/2020   Medicare annual wellness visit, subsequent 08/25/2015   Murmur, cardiac    Nonalcoholic fatty liver disease    Obesity    Osteoarthritis    low back pain with left sciatica   Osteopenia    dexa 2013   Renal calculus    hx   RENAL CALCULUS, HX OF 01/31/2009   Qualifier: Diagnosis of  By: Susette Racer CMA, Jewel     Restless leg syndrome    Past Surgical History:  Procedure Laterality Date   APPENDECTOMY     CARDIAC CATHETERIZATION  1/12   revealed normal cors and LV function   COLONOSCOPY     cystocopy  10/17/03   left stent placement, TURBT   FOOT SURGERY     LUMBAR LAMINECTOMY/DECOMPRESSION MICRODISCECTOMY N/A 01/19/2013   Procedure: LUMBAR THREE FOUR LUMBAR LAMINECTOMY/DECOMPRESSION MICRODISCECTOMY 1 LEVEL;  Surgeon: Reinaldo Meeker, MD;  Location: [facility];  Service: Neurosurgery;  Laterality: N/A;   TOTAL ABDOMINAL HYSTERECTOMY  1985   TOTAL HIP ARTHROPLASTY Left 08/28/2017   Procedure: TOTAL HIP ARTHROPLASTY ANTERIOR APPROACH;  Surgeon: Samson Frederic, MD;  Location: [facility];  Service: Orthopedics;   Laterality: Left;   Family History  Problem Relation Age of Onset   Heart failure Mother    Heart attack Mother    Hypertension Mother    Thyroid cancer Mother    Stroke Father    Hypertension Father    Stroke Brother    Breast cancer Neg Hx    Colon cancer Neg Hx    Social History   Socioeconomic History   Marital status: Married    Spouse name: Not on file   Number of children: Not on file   Years of education: Not on file   Highest education level: Not on file  Occupational History   Not on file  Tobacco Use   Smoking status: Former    Current packs/day: 0.00  Types: Cigarettes    Quit date: 03/18/1985    Years since quitting: 37.9   Smokeless tobacco: Never   Tobacco comments:    denies   Vaping Use   Vaping status: Never Used  Substance and Sexual Activity   Alcohol use: No   Drug use: No   Sexual activity: Never  Other Topics Concern   Not on file  Social History Narrative   Lives in [place], with husband Renae Fickle.   Has 4 adult children , highest level of education is seventh grade.   Former smoker, denies recreational drugs or alcohol use.   Patient drink caffeine beverages, takes a daily vitamin   Patient was her seatbelt , she has dentures , she has a smoke detector in the home , there are guns in the home a  Locked case    Patient feels safe in her relationships       Social Determinants of Health   Financial Resource Strain: Low Risk  (02/26/2023)   Overall Financial Resource Strain (CARDIA)    Difficulty of Paying Living Expenses: Not hard at all  Food Insecurity: No Food Insecurity (02/26/2023)   Hunger Vital Sign    Worried About Running Out of Food in the Last Year: Never true    Ran Out of Food in the Last Year: Never true  Transportation Needs: No Transportation Needs (02/26/2023)   PRAPARE - Administrator, Civil Service (Medical): No    Lack of Transportation (Non-Medical): No  Physical Activity: Inactive (02/26/2023)    Exercise Vital Sign    Days of Exercise per Week: 0 days    Minutes of Exercise per Session: 0 min  Stress: No Stress Concern Present (02/26/2023)   [employer] of Occupational Health - Occupational Stress Questionnaire    Feeling of Stress : Not at all  Social Connections: Moderately Isolated (02/26/2023)   Social Connection and Isolation Panel [NHANES]    Frequency of Communication with Friends and Family: Three times a week    Frequency of Social Gatherings with Friends and Family: Three times a week    Attends Religious Services: Never    Active Member of Clubs or Organizations: No    Attends Engineer, structural: Never    Marital Status: Married    Tobacco Counseling Counseling given: Not Answered Tobacco comments: denies    Clinical Intake:  Pre-visit preparation completed: Yes  Pain : No/denies pain     Diabetes: No  How often do you need to have someone help you when you read instructions, pamphlets, or other written materials from your doctor or pharmacy?: 1 - Never  Interpreter Needed?: No  Information entered by :: Remi Haggard LPN   Activities of Daily Living    02/26/2023   11:07 AM 03/12/2022   10:12 AM  In your present state of health, do you have any difficulty performing the following activities:  Hearing? 0 0  Vision? 0 0  Difficulty concentrating or making decisions? 1 1  Walking or climbing stairs? 0 1  Dressing or bathing? 0 0  Doing errands, shopping? 1 0  Preparing Food and eating ? N N  Using the Toilet? N N  In the past six months, have you accidently leaked urine? N N  Do you have problems with loss of bowel control? N N  Managing your Medications? N N  Managing your Finances? N Y  Housekeeping or managing your Housekeeping? N N    Patient Care Team:  Natalia Leatherwood, DO as PCP - General (Family Medicine) Nobie Putnam, MD as PCP - Cardiology (Cardiology) Hillis Range, MD (Inactive) as Attending Physician  (Cardiology) Bernette Redbird, MD as Consulting Physician (Gastroenterology) Barron Alvine, MD (Inactive) as Consulting Physician (Urology) Tanya Nones, OD (Optometry) Lanier Prude, MD as Consulting Physician (Cardiology)  Indicate any recent Medical Services you may have received from other than [facility] providers in the past year (date may be approximate).     Assessment:   This is a routine wellness examination for Aleta.  Hearing/Vision screen Hearing Screening - Comments:: No trouble hearing Vision Screening - Comments:: Up to date [facility]   Goals Addressed             This Visit's Progress    Patient Stated       Continue current lifestyle       Depression Screen    02/26/2023   11:02 AM 02/04/2023    9:16 AM 08/21/2022   11:05 AM 02/27/2022    8:35 AM 09/26/2021    8:06 AM 04/25/2021   10:27 AM 01/03/2021    1:42 PM  PHQ 2/9 Scores  PHQ - 2 Score 0 1 2 1  0 0 0  PHQ- 9 Score 3 5 6 2   0     Fall Risk    02/26/2023   10:55 AM 08/21/2022   11:04 AM 02/27/2022    8:25 AM 01/03/2021    1:33 PM 12/29/2019    2:20 PM  Fall Risk   Falls in the past year? 0 1 0 0 0  Number falls in past yr: 0 0 0 0 0  Injury with Fall? 0 0 0 0 0  Risk for fall due to : Impaired balance/gait No Fall Risks No Fall Risks    Follow up Falls evaluation completed;Education provided;Falls prevention discussed Falls evaluation completed Falls evaluation completed Falls evaluation completed;Falls prevention discussed Falls prevention discussed    MEDICARE RISK AT HOME: Medicare Risk at Home Any stairs in or around the home?: No If so, are there any without handrails?: No Home free of loose throw rugs in walkways, pet beds, electrical cords, etc?: Yes Adequate lighting in your home to reduce risk of falls?: Yes Life alert?: No Use of a cane, walker or w/c?: Yes Grab bars in the bathroom?: Yes Elevated toilet seat or a handicapped toilet?: Yes  TIMED UP AND GO:  Was the  test performed?  No    Cognitive Function:    09/19/2016    9:57 AM  MMSE - Mini Mental State Exam  Orientation to time 5  Orientation to Place 5  Registration 3  Attention/ Calculation 2  Recall 2  Language- name 2 objects 2  Language- repeat 1  Language- follow 3 step command 3  Language- read & follow direction 1  Write a sentence 0  Copy design 1  Total score 25        02/26/2023   10:58 AM 03/12/2022   10:15 AM  6CIT Screen  What Year? 4 points 4 points  What month? 0 points 3 points  What time? 0 points 0 points  Count back from 20 4 points 0 points  Months in reverse 4 points 0 points  Repeat phrase 6 points 10 points  Total Score 18 points 17 points    Immunizations Immunization History  Administered Date(s) Administered   Fluad Quad(high Dose 65+) 11/30/2018, 01/13/2020, 04/25/2021, 02/27/2022   Fluad Trivalent(High Dose 65+)  02/04/2023   Influenza, High Dose Seasonal PF 01/02/2017, 01/12/2018   Influenza,inj,Quad PF,6+ Mos 01/23/2015, 12/15/2015   Pneumococcal Conjugate-13 08/18/2014   Pneumococcal Polysaccharide-23 02/27/2009, 08/25/2015   Tdap 02/22/2015    TDAP status: Up to date  Flu Vaccine status: Up to date  Pneumococcal vaccine status: Up to date  Covid-19 vaccine status: Declined, Education has been provided regarding the importance of this vaccine but patient still declined. Advised may receive this vaccine at local pharmacy or Health Dept.or vaccine clinic. Aware to provide a copy of the vaccination record if obtained from local pharmacy or Health Dept. Verbalized acceptance and understanding.  Qualifies for Shingles Vaccine? Yes   Zostavax completed No   Shingrix Completed?: No.    Education has been provided regarding the importance of this vaccine. Patient has been advised to call insurance company to determine out of pocket expense if they have not yet received this vaccine. Advised may also receive vaccine at local pharmacy or Health  Dept. Verbalized acceptance and understanding.  Screening Tests Health Maintenance  Topic Date Due   Medicare Annual Wellness (AWV)  02/26/2024   DTaP/Tdap/Td (2 - Td or Tdap) 02/21/2025   Pneumonia Vaccine 65+ Years old  Completed   INFLUENZA VACCINE  Completed   DEXA SCAN  Completed   Hepatitis C Screening  Completed   HPV VACCINES  Aged Out   Colonoscopy  Discontinued   COVID-19 Vaccine  Discontinued   Zoster Vaccines- Shingrix  Discontinued    Health Maintenance  There are no preventive care reminders to display for this patient.  Colorectal cancer screening: No longer required.   Mammogram status: No longer required due to  .  Bone Density status: Completed 2020. Results reflect: Bone density results: OSTEOPENIA. Repeat every 5 years.  Lung Cancer Screening: (Low Dose CT Chest recommended if Age 49-80 years, 20 pack-year currently smoking OR have quit w/in 15years.) does not qualify.   Lung Cancer Screening Referral:   Additional Screening:  Hepatitis C Screening: does not qualify; Completed 2017  Vision Screening: Recommended annual ophthalmology exams for early detection of glaucoma and other disorders of the eye. Is the patient up to date with their annual eye exam?  Yes  Who is the provider or what is the name of the office in which the patient attends annual eye exams? [facility] If pt is not established with a provider, would they like to be referred to a provider to establish care? No .   Dental Screening: Recommended annual dental exams for proper oral hygiene  Community Resource Referral / Chronic Care Management: CRR required this visit?  No   CCM required this visit?  No     Plan:     I have personally reviewed and noted the following in the patient's chart:   Medical and social history Use of alcohol, tobacco or illicit drugs  Current medications and supplements including opioid prescriptions. Patient is currently taking opioid  prescriptions. Information provided to patient regarding non-opioid alternatives. Patient advised to discuss non-opioid treatment plan with their provider. Functional ability and status Nutritional status Physical activity Advanced directives List of other physicians Hospitalizations, surgeries, and ER visits in previous 12 months Vitals Screenings to include cognitive, depression, and falls Referrals and appointments  In addition, I have reviewed and discussed with patient certain preventive protocols, quality metrics, and best practice recommendations. A written personalized care plan for preventive services as well as general preventive health recommendations were provided to patient.  Remi Haggard, LPN   [id number]   After Visit Summary: (MyChart) Due to this being a telephonic visit, the after visit summary with patients personalized plan was offered to patient via MyChart   Nurse Notes:

## 2023-04-02 ENCOUNTER — Ambulatory Visit: Payer: Medicare Other

## 2023-04-08 ENCOUNTER — Ambulatory Visit: Payer: Medicare Other

## 2023-04-18 ENCOUNTER — Ambulatory Visit: Payer: Medicare Other | Attending: Cardiology | Admitting: *Deleted

## 2023-04-18 DIAGNOSIS — I4891 Unspecified atrial fibrillation: Secondary | ICD-10-CM

## 2023-04-18 DIAGNOSIS — Z5181 Encounter for therapeutic drug level monitoring: Secondary | ICD-10-CM

## 2023-04-18 LAB — POCT INR: INR: 2.6 (ref 2.0–3.0)

## 2023-04-18 NOTE — Patient Instructions (Signed)
  Description   Continue taking Warfarin 1 tablet everyday except 1/2 tablet on Sundays.  Stay consistent with greens (3 per week).  Recheck INR in 6 weeks.  [phone] or [phone] Coumadin clinic. Main # [phone].

## 2023-04-27 IMAGING — DX DG CHEST 1V PORT
1 series · 1 of 1 positions shown · non-contrast
Comparison: 04/22/2019

CLINICAL DATA: Shortness of breath

EXAM:
PORTABLE CHEST 1 VIEW

[chest]
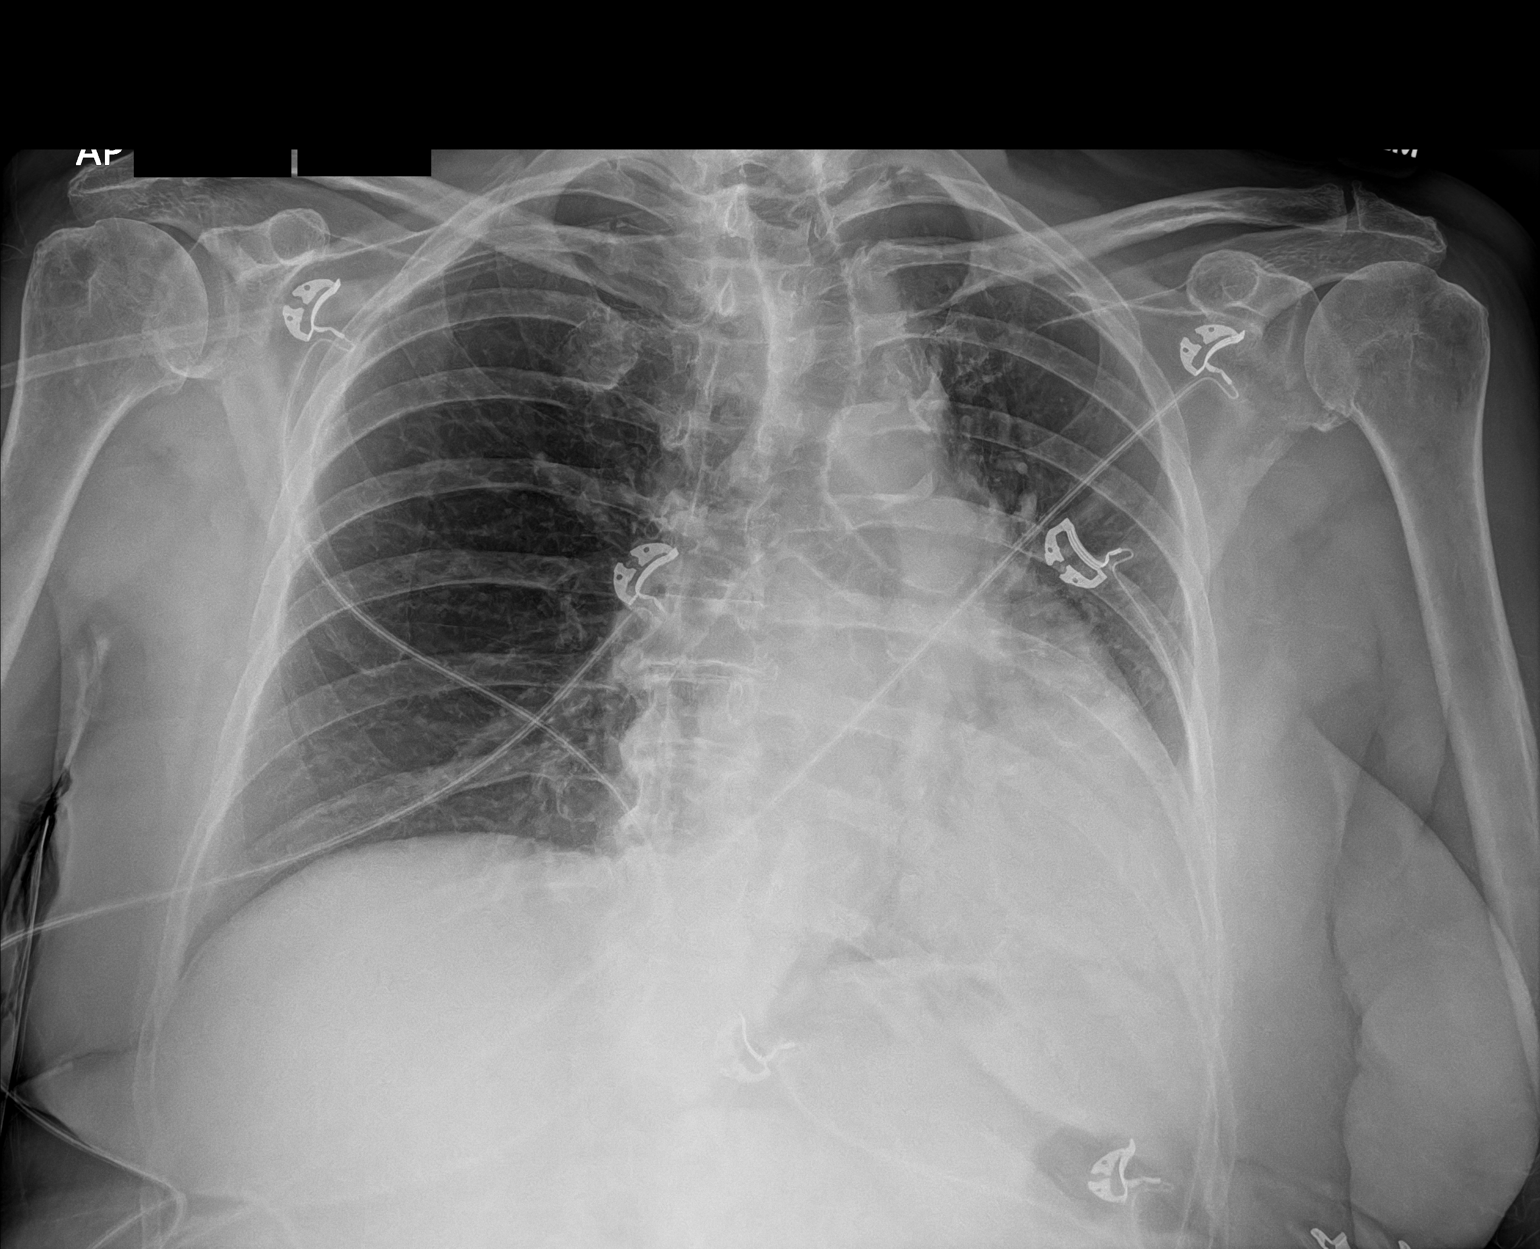

[1 of 1 positions shown; findings below may reference images not displayed]

FINDINGS: Heart is borderline enlarged. Aortic atherosclerosis. Left lower
lobe atelectasis or infiltrate. Right lung clear. No effusions or
acute bony abnormality.
IMPRESSION: Borderline cardiomegaly.  Aortic atherosclerosis.

Left lower lobe atelectasis or infiltrate.

## 2023-05-05 ENCOUNTER — Other Ambulatory Visit: Payer: Self-pay | Admitting: Family Medicine

## 2023-05-05 NOTE — Telephone Encounter (Signed)
Copied from CRM [id number]. Topic: Clinical - Medication Refill >> May 05, 2023  9:37 AM Irine Seal wrote: Most Recent Primary Care Visit:  Provider: Laurey Arrow  Department: [facility]  Visit Type: MEDICARE AWV, SEQUENTIAL  Date: 02/26/2023  Medication: atorvastatin (LIPITOR) 20 MG tablet  Has the patient contacted their pharmacy? Yes (Agent: If no, request that the patient contact the pharmacy for the refill. If patient does not wish to contact the pharmacy document the reason why and proceed with request.) (Agent: If yes, when and what did the pharmacy advise?) pharmacy stated to contact PCP   Is this the correct pharmacy for this prescription? Yes If no, delete pharmacy and type the correct one.  This is the patient's preferred pharmacy:    [id number] - [place], [place] - [address] [place] [address] Phone: [phone] Fax: [phone]     Has the prescription been filled recently? Yes  Is the patient out of the medication? Yes out for over a week   Has the patient been seen for an appointment in the last year OR does the patient have an upcoming appointment? Yes  Can we respond through MyChart? Yes  Agent: Please be advised that Rx refills may take up to 3 business days. We ask that you follow-up with your pharmacy.

## 2023-05-13 ENCOUNTER — Other Ambulatory Visit: Payer: Self-pay

## 2023-05-13 MED ORDER — FUROSEMIDE 20 MG PO TABS: ORAL_TABLET | ORAL | 0 refills | Status: DC

## 2023-05-30 ENCOUNTER — Ambulatory Visit: Payer: Medicare Other | Attending: Cardiology | Admitting: *Deleted

## 2023-05-30 DIAGNOSIS — I4891 Unspecified atrial fibrillation: Secondary | ICD-10-CM

## 2023-05-30 DIAGNOSIS — Z5181 Encounter for therapeutic drug level monitoring: Secondary | ICD-10-CM

## 2023-05-30 LAB — POCT INR: INR: 2.8 (ref 2.0–3.0)

## 2023-05-30 NOTE — Patient Instructions (Addendum)
 Description   Continue taking Warfarin 1 tablet everyday except 1/2 tablet on Sundays.  Stay consistent with greens (3 per week).  Recheck INR in 7 weeks.  [phone] or [phone] Coumadin clinic. Main # [phone].

## 2023-07-18 ENCOUNTER — Ambulatory Visit

## 2023-07-19 ENCOUNTER — Other Ambulatory Visit: Payer: Self-pay | Admitting: Physician Assistant

## 2023-07-19 DIAGNOSIS — I4891 Unspecified atrial fibrillation: Secondary | ICD-10-CM

## 2023-07-22 ENCOUNTER — Encounter: Payer: Self-pay | Admitting: Family Medicine

## 2023-07-22 ENCOUNTER — Ambulatory Visit: Payer: Medicare Other | Admitting: Family Medicine

## 2023-07-22 VITALS — BP 106/68 | HR 53 | Temp 97.7°F | Wt 208.8 lb

## 2023-07-22 DIAGNOSIS — I482 Chronic atrial fibrillation, unspecified: Secondary | ICD-10-CM | POA: Diagnosis not present

## 2023-07-22 DIAGNOSIS — M4316 Spondylolisthesis, lumbar region: Secondary | ICD-10-CM | POA: Diagnosis not present

## 2023-07-22 DIAGNOSIS — F330 Major depressive disorder, recurrent, mild: Secondary | ICD-10-CM

## 2023-07-22 DIAGNOSIS — F5101 Primary insomnia: Secondary | ICD-10-CM | POA: Diagnosis not present

## 2023-07-22 DIAGNOSIS — I10 Essential (primary) hypertension: Secondary | ICD-10-CM | POA: Diagnosis not present

## 2023-07-22 DIAGNOSIS — E213 Hyperparathyroidism, unspecified: Secondary | ICD-10-CM

## 2023-07-22 DIAGNOSIS — R7303 Prediabetes: Secondary | ICD-10-CM | POA: Diagnosis not present

## 2023-07-22 DIAGNOSIS — M47816 Spondylosis without myelopathy or radiculopathy, lumbar region: Secondary | ICD-10-CM

## 2023-07-22 DIAGNOSIS — E785 Hyperlipidemia, unspecified: Secondary | ICD-10-CM | POA: Diagnosis not present

## 2023-07-22 DIAGNOSIS — M4724 Other spondylosis with radiculopathy, thoracic region: Secondary | ICD-10-CM

## 2023-07-22 DIAGNOSIS — E559 Vitamin D deficiency, unspecified: Secondary | ICD-10-CM | POA: Diagnosis not present

## 2023-07-22 DIAGNOSIS — E063 Autoimmune thyroiditis: Secondary | ICD-10-CM

## 2023-07-22 DIAGNOSIS — I700 Atherosclerosis of aorta: Secondary | ICD-10-CM

## 2023-07-22 DIAGNOSIS — I2510 Atherosclerotic heart disease of native coronary artery without angina pectoris: Secondary | ICD-10-CM | POA: Diagnosis not present

## 2023-07-22 DIAGNOSIS — D6869 Other thrombophilia: Secondary | ICD-10-CM

## 2023-07-22 DIAGNOSIS — M8589 Other specified disorders of bone density and structure, multiple sites: Secondary | ICD-10-CM

## 2023-07-22 DIAGNOSIS — G8929 Other chronic pain: Secondary | ICD-10-CM

## 2023-07-22 LAB — POCT GLYCOSYLATED HEMOGLOBIN (HGB A1C)
HbA1c POC (<> result, manual entry): 5.8 % (ref 4.0–5.6)
HbA1c, POC (controlled diabetic range): 5.8 % (ref 0.0–7.0)
HbA1c, POC (prediabetic range): 5.8 % (ref 5.7–6.4)
Hemoglobin A1C: 5.8 % — AB (ref 4.0–5.6)

## 2023-07-22 MED ORDER — AMLODIPINE BESYLATE 10 MG PO TABS: 10.0000 mg | ORAL_TABLET | Freq: Every day | ORAL | 1 refills | Status: DC

## 2023-07-22 MED ORDER — METOPROLOL TARTRATE 50 MG PO TABS: 25.0000 mg | ORAL_TABLET | Freq: Two times a day (BID) | ORAL | 1 refills | Status: DC

## 2023-07-22 MED ORDER — GABAPENTIN 100 MG PO CAPS: ORAL_CAPSULE | ORAL | 5 refills | Status: DC

## 2023-07-22 MED ORDER — HYDROCODONE-ACETAMINOPHEN 5-325 MG PO TABS
1.0000 | ORAL_TABLET | Freq: Two times a day (BID) | ORAL | 0 refills | Status: DC | PRN
Start: 2023-10-07 — End: 2024-01-06

## 2023-07-22 MED ORDER — PAROXETINE HCL 20 MG PO TABS: ORAL_TABLET | ORAL | 1 refills | Status: DC

## 2023-07-22 MED ORDER — ESCITALOPRAM OXALATE 10 MG PO TABS: 10.0000 mg | ORAL_TABLET | Freq: Every day | ORAL | 1 refills | Status: DC

## 2023-07-22 MED ORDER — HYDROCODONE-ACETAMINOPHEN 5-325 MG PO TABS
1.0000 | ORAL_TABLET | Freq: Two times a day (BID) | ORAL | 0 refills | Status: DC | PRN
Start: 2023-07-22 — End: 2024-01-05

## 2023-07-22 MED ORDER — TRAZODONE HCL 100 MG PO TABS: ORAL_TABLET | ORAL | 1 refills | Status: DC

## 2023-07-22 MED ORDER — BENAZEPRIL HCL 20 MG PO TABS: ORAL_TABLET | ORAL | 1 refills | Status: DC

## 2023-07-22 MED ORDER — FUROSEMIDE 20 MG PO TABS: ORAL_TABLET | ORAL | 1 refills | Status: DC

## 2023-07-22 MED ORDER — POTASSIUM CHLORIDE CRYS ER 20 MEQ PO TBCR: EXTENDED_RELEASE_TABLET | ORAL | 5 refills | Status: DC

## 2023-07-22 NOTE — Progress Notes (Signed)
 Jennifer House , [birth year], 78 y.o., female MRN: [id number] Patient Care Team    Relationship Specialty Notifications Start End  Mariel Shope, DO PCP - General Family Medicine  01/23/15   Lanita Pitman, MD Consulting Physician Gastroenterology  08/25/15   Ann Barnacle, MD (Inactive) Consulting Physician Urology  08/25/15   Verta Goring, OD  Optometry  08/25/15    Comment: "Jullie Oiler" at [facility]  Ardeen Kohler, MD Consulting Physician Clinical Cardiac Electrophysiology  06/25/23     Chief Complaint  Patient presents with   Hypertension    Pt is fasting.      Subjective:Jennifer House is a 78 y.o. female present for chronic condition management.All past medical history, surgical history, allergies, family history, immunizations and social history was obtained from the patient today and entered into the electronic medical record.  Hypertension/hyperlipidemia/hypertriglyceridemia/A. fib: Pt reports compliance with Benzapril 30 mg, amlodipine  10 mg, metoprolol  25 mg twice a day, Lasix  20 mg (MWF) today.  Patient denies chest pain, shortness of breath, dizziness or lower extremity edema.   Pt is on warfarin therapy per cardiology. Pt is prescribed Zetia  and tolerating statin.  Diet: Low-sodium Exercise: Tries to exercise  RF: Hypertension, hyperlipidemia, family history of heart disease and stroke, personal history A. Fib, obesity, former smoker  Depression/insomnia: Patient reports compliance with Paxil  20 mg, Lexapro  20 mg and trazodone  100 mg .  She feels this regimen is working well for her, but would like to consider decreasing her dose.   Her husband passed away 2020/12/04 and her daughter has been helping her/emotional support.      Encounter for chronic pain/lumbar arthritis: tramadol  was not effective.  Vicodin is working well for her and helping her remain active.  She does not routinely take the daytime dose, but does take a few times a week.  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 Pain contract signed (Y/N): Y Date narcotic database last reviewed (include red flags): 07/22/23       07/22/2023    8:41 AM 02/26/2023   11:02 AM 02/04/2023    9:16 AM 08/21/2022   11:05 AM 02/27/2022    8:35 AM  Depression screen PHQ 2/9  Decreased Interest 0 0 1 1 1   Down, Depressed, Hopeless 0 0 0 1 0  PHQ - 2 Score 0 0 1 2 1   Altered sleeping 1 0 0 2 1  Tired, decreased energy 1 0 1 2 0  Change in appetite 0 0 0 0 0  Feeling bad or failure about yourself  0 0 3 0 0  Trouble concentrating 0 3 0 0 0  Moving slowly or fidgety/restless 0 0 0 0 0  Suicidal thoughts 0 0 0 0 0  PHQ-9 Score 2 3 5 6 2   Difficult doing work/chores Not difficult at all Not difficult at all Not difficult at all      Allergies  Allergen Reactions   Ibandronate Sodium Other (See Comments)    Bones hurt   Pravachol [Pravastatin Sodium] Other (See Comments)    Myalgias    Nsaids     anticoagulated   Ultram  [Tramadol  Hcl] Nausea Only   Social History   Social History Narrative  Lives in [place], with husband Donavon Fudge.   Has 4 adult children , highest level of education is seventh grade.   Former smoker, denies recreational drugs or alcohol use.   Patient drink caffeine beverages, takes a daily vitamin   Patient was her seatbelt , she has dentures , she has a smoke detector in the home , there are guns in the home a  Locked case    Patient feels safe in her relationships       Past Medical History:  Diagnosis Date   AKI (acute kidney injury) (HCC) 10/19/2020   Anxiety    Atrial fibrillation (HCC)    longstanding persistent   Carcinoma of bladder (HCC) 2000   transitional cell hx; Dr. Bosie Bye   Closed  displaced fracture of left femoral neck (HCC) 08/28/2017   Colon polyps    Depression    GERD (gastroesophageal reflux disease)    Headache(784.0)    hx of migraines   Hematuria    hx   HLD (hyperlipidemia)    mixed   HTN (hypertension)    Hypothyroidism    Insomnia    Liver function test abnormality    hx fatty liver   Lobar pneumonia (HCC) 10/19/2020   Medicare annual wellness visit, subsequent 08/25/2015   Murmur, cardiac    Nonalcoholic fatty liver disease    Obesity    Osteoarthritis    low back pain with left sciatica   Osteopenia    dexa 2013   Renal calculus    hx   RENAL CALCULUS, HX OF 01/31/2009   Qualifier: Diagnosis of  By: Sallyanne Creamer CMA, Jewel     Restless leg syndrome    Past Surgical History:  Procedure Laterality Date   APPENDECTOMY     CARDIAC CATHETERIZATION  1/12   revealed normal cors and LV function   COLONOSCOPY     cystocopy  10/17/03   left stent placement, TURBT   FOOT SURGERY     LUMBAR LAMINECTOMY/DECOMPRESSION MICRODISCECTOMY N/A 01/19/2013   Procedure: LUMBAR THREE FOUR LUMBAR LAMINECTOMY/DECOMPRESSION MICRODISCECTOMY 1 LEVEL;  Surgeon: Augustine Blocker, MD;  Location: [facility];  Service: Neurosurgery;  Laterality: N/A;   TOTAL ABDOMINAL HYSTERECTOMY  1985   TOTAL HIP ARTHROPLASTY Left 08/28/2017   Procedure: TOTAL HIP ARTHROPLASTY ANTERIOR APPROACH;  Surgeon: Adonica Hoose, MD;  Location: [facility];  Service: Orthopedics;  Laterality: Left;   Family History  Problem Relation Age of Onset   Heart failure Mother    Heart attack Mother    Hypertension Mother    Thyroid  cancer Mother    Stroke Father    Hypertension Father    Stroke Brother    Breast cancer Neg Hx    Colon cancer Neg Hx    Allergies as of 07/22/2023       Reactions   Ibandronate Sodium Other (See Comments)   Bones hurt   Pravachol [pravastatin Sodium] Other (See Comments)   Myalgias   Nsaids    anticoagulated   Ultram  [tramadol  Hcl] Nausea Only        Medication List         Accurate as of Jul 22, 2023 12:59 PM. If you have any questions, ask your nurse or doctor.          amLODipine  10 MG tablet Commonly known as: NORVASC  Take 1 tablet (10 mg total) by mouth daily.   atorvastatin  20 MG tablet Commonly known as: LIPITOR Take 1 tablet (20 mg total) by mouth daily.  benazepril  20 MG tablet Commonly known as: LOTENSIN  TAKE 1 AND 1/2 TABLETS(30 MG) BY MOUTH DAILY   calcium  carbonate 1500 (600 Ca) MG Tabs tablet Commonly known as: OSCAL Take 3 tablets by mouth daily.   Centrum Silver tablet Take 1 tablet by mouth daily.   cholecalciferol 1000 units tablet Commonly known as: VITAMIN D  Take 1,000 Units by mouth daily.   cyanocobalamin  1000 MCG tablet Commonly known as: VITAMIN B12 Take 1,000 mcg by mouth daily.   escitalopram  10 MG tablet Commonly known as: LEXAPRO  Take 1 tablet (10 mg total) by mouth daily. What changed:  medication strength how much to take Changed by: Napolean Backbone   ezetimibe  10 MG tablet Commonly known as: ZETIA  TAKE 1 TABLET(10 MG) BY MOUTH DAILY   furosemide  20 MG tablet Commonly known as: LASIX  TAKE 1 TABLET BY MOUTH EVERY MONDAY, WEDNESDAY AND FRIDAY   gabapentin  100 MG capsule Commonly known as: NEURONTIN  1 cap in morning and 2 caps at night   HYDROcodone -acetaminophen  5-325 MG tablet Commonly known as: NORCO/VICODIN Take 1 tablet by mouth 2 (two) times daily as needed for moderate pain (pain score 4-6). What changed: Another medication with the same name was changed. Make sure you understand how and when to take each. Changed by: Masiyah Jorstad   HYDROcodone -acetaminophen  5-325 MG tablet Commonly known as: NORCO/VICODIN Take 1 tablet by mouth 2 (two) times daily as needed for moderate pain (pain score 4-6). Start taking on: October 07, 2023 What changed: These instructions start on October 07, 2023. If you are unsure what to do until then, ask your doctor or other care provider. Changed by: Ginelle Bays   levothyroxine  75 MCG tablet Commonly known as: SYNTHROID  TAKE 1 TABLET(75 MCG) BY MOUTH DAILY. Monday- Saturday. 1.5 tabs on Sunday.   metoprolol  tartrate 50 MG tablet Commonly known as: LOPRESSOR  Take 0.5 tablets (25 mg total) by mouth 2 (two) times daily.   nitroGLYCERIN  0.4 MG SL tablet Commonly known as: NITROSTAT  PLACE 1 TABLET UNDER THE TONGUE EVERY 5 MINUTES AS NEEDED FOR CHEST PAIN,(MAX 3 TABLETS)   PARoxetine  20 MG tablet Commonly known as: PAXIL  TAKE 1 TABLET(20 MG) BY MOUTH DAILY   potassium chloride  SA 20 MEQ tablet Commonly known as: KLOR-CON  M 1 tab p.o. on days taking Lasix    traZODone  100 MG tablet Commonly known as: DESYREL  TAKE 1 TABLET(100 MG) BY MOUTH AT BEDTIME   VITAMIN C PO Take by mouth.   warfarin 5 MG tablet Commonly known as: COUMADIN  Take as directed by the anticoagulation clinic. If you are unsure how to take this medication, talk to your nurse or doctor. Original instructions: TAKE 1 TABLET BY MOUTH DAILY AS DIRECTED BY COUMADIN  CLINIC        All past medical history, surgical history, allergies, family history, immunizations andmedications were updated in the EMR today and reviewed under the history and medication portions of their EMR.     ROS: Negative, with the exception of above mentioned in HPI   Objective:  BP 106/68   Pulse (!) 53   Temp 97.7 F (36.5 C)   Wt 208 lb 12.8 oz (94.7 kg)   SpO2 96%   BMI 36.99 kg/m  Body mass index is 36.99 kg/m. Physical Exam Vitals and nursing note reviewed.  Constitutional:      General: She is not in acute distress.    Appearance: Normal appearance. She is not ill-appearing, toxic-appearing or diaphoretic.  HENT:     Head: Normocephalic and  atraumatic.     Mouth/Throat:     Mouth: Mucous membranes are moist.  Eyes:     General: No scleral icterus.       Right eye: No discharge.        Left eye: No discharge.     Extraocular Movements: Extraocular movements intact.      Conjunctiva/sclera: Conjunctivae normal.     Pupils: Pupils are equal, round, and reactive to light.  Cardiovascular:     Rate and Rhythm: Normal rate and regular rhythm.     Heart sounds: No murmur heard. Pulmonary:     Effort: Pulmonary effort is normal. No respiratory distress.     Breath sounds: Normal breath sounds. No wheezing, rhonchi or rales.  Abdominal:     General: Bowel sounds are normal. There is no distension.     Palpations: Abdomen is soft.     Tenderness: There is no abdominal tenderness. There is no right CVA tenderness, left CVA tenderness or rebound.  Musculoskeletal:     Right lower leg: No edema.     Left lower leg: No edema.  Skin:    General: Skin is warm.     Findings: No rash.  Neurological:     Mental Status: She is alert and oriented to person, place, and time. Mental status is at baseline.     Motor: No weakness.     Gait: Gait normal.  Psychiatric:        Mood and Affect: Mood normal.        Behavior: Behavior normal.        Thought Content: Thought content normal.        Judgment: Judgment normal.     Results for orders placed or performed in visit on 07/22/23 (from the past 48 hours)  POCT HgB A1C     Status: Abnormal   Collection Time: 07/22/23  8:46 AM  Result Value Ref Range   Hemoglobin A1C 5.8 (A) 4.0 - 5.6 %   HbA1c POC (<> result, manual entry) 5.8 4.0 - 5.6 %   HbA1c, POC (prediabetic range) 5.8 5.7 - 6.4 %   HbA1c, POC (controlled diabetic range) 5.8 0.0 - 7.0 %     Assessment/Plan: Kristianne Barsoum is a 78 y.o. female present for OV for chronic medical conditions management Arthritis, lumbar spine/Osteoarthritis of thoracic spine, unspecified spinal osteoarthritis complication status/Spondylolisthesis at L3-L4 level/Encounter for chronic pain/morbid obesity Stable Medication has increased her quality of life and is able to keep her active.  - Chronic pain script: continue  hydrocodone  5-325 mg BID PRn  #180 supplied.  -  UDS-UTD - contract signed  -   controlled substance database reviewed 07/22/23 -Continue gabapentin  100 mg 1 tab morning and 2 tabs before bed. - she is tolerating.  Patient may want to start to decrease his medication next visit. - Continue Vicodin 5-325 twice daily as needed x 2 scripts   Essential hypertension, benign/hyperlipidemia/A. Fib/ obesity/Atrial fibrillation, unspecified type (HCC)/Aortic atherosclerosis (HCC)/acquired thrombophilia/morbid obesity Stable Continue benzapril to 30 mg daily Continue amlodipine  10  Continue Lasix  MWF Continue metoprolol  25 BID  Continue Zetia  10 mg daily Continue statin- tolerating.  - Continue follow-up with cardiology for atrial fibrillation and coumadin  management - low salt diet, increase exercise.  Labs due next visit  Predm A1c>6.1>  5.8 today  Hypothyroid: Continue levothyroxine  75mcg daily, with an extra half a tablet on Sundays  Hypocalcemia/Osteopenia, unspecified location/hyperparathyroidism - takes vit d 1000u daily. Last dexa 2013 -  under the care of  endocrine.  -Vitamin D  levels up-to-date  Insomnia, unspecified type/ Moderate episode of recurrent major depressive disorder (HCC) Stable Continue trazodone  100 mg nightly Decrease lexapro  20 mg daily> 10 mg daily for 4 weeks, then can discontinue if desired. Continue Paxil  to 20 mg daily   Return in about 24 weeks (around 01/06/2024) for Routine chronic condition follow-up.  Reviewed expectations re: course of current medical issues. Discussed self-management of symptoms. Outlined signs and symptoms indicating need for more acute intervention. Patient verbalized understanding and all questions were answered. Patient received an After-Visit Summary.  52 minutes spent during patient encounter covering multiple chronic conditions and new hematuria in a patient with a history of bladder cancer.  Orders Placed This Encounter  Procedures   POCT HgB A1C     Meds ordered this encounter  Medications   amLODipine  (NORVASC ) 10 MG tablet    Sig: Take 1 tablet (10 mg total) by mouth daily.    Dispense:  90 tablet    Refill:  1   benazepril  (LOTENSIN ) 20 MG tablet    Sig: TAKE 1 AND 1/2 TABLETS(30 MG) BY MOUTH DAILY    Dispense:  135 tablet    Refill:  1   escitalopram  (LEXAPRO ) 10 MG tablet    Sig: Take 1 tablet (10 mg total) by mouth daily.    Dispense:  90 tablet    Refill:  1   gabapentin  (NEURONTIN ) 100 MG capsule    Sig: 1 cap in morning and 2 caps at night    Dispense:  270 capsule    Refill:  5   HYDROcodone -acetaminophen  (NORCO/VICODIN) 5-325 MG tablet    Sig: Take 1 tablet by mouth 2 (two) times daily as needed for moderate pain (pain score 4-6).    Dispense:  180 tablet    Refill:  0   metoprolol  tartrate (LOPRESSOR ) 50 MG tablet    Sig: Take 0.5 tablets (25 mg total) by mouth 2 (two) times daily.    Dispense:  90 tablet    Refill:  1   PARoxetine  (PAXIL ) 20 MG tablet    Sig: TAKE 1 TABLET(20 MG) BY MOUTH DAILY    Dispense:  90 tablet    Refill:  1   potassium chloride  SA (KLOR-CON  M) 20 MEQ tablet    Sig: 1 tab p.o. on days taking Lasix     Dispense:  30 tablet    Refill:  5   traZODone  (DESYREL ) 100 MG tablet    Sig: TAKE 1 TABLET(100 MG) BY MOUTH AT BEDTIME    Dispense:  90 tablet    Refill:  1   furosemide  (LASIX ) 20 MG tablet    Sig: TAKE 1 TABLET BY MOUTH EVERY MONDAY, WEDNESDAY AND FRIDAY    Dispense:  36 tablet    Refill:  1   HYDROcodone -acetaminophen  (NORCO/VICODIN) 5-325 MG tablet    Sig: Take 1 tablet by mouth 2 (two) times daily as needed for moderate pain (pain score 4-6).    Dispense:  180 tablet    Refill:  0      Note is dictated utilizing voice recognition software. Although note has been proof read prior to signing, occasional typographical errors still can be missed. If any questions arise, please do not hesitate to call for verification.   electronically signed by:  Napolean Backbone, DO   [facility] - [place]

## 2023-07-22 NOTE — Patient Instructions (Signed)
 Return in about 24 weeks (around 01/06/2024) for Routine chronic condition follow-up.        Great to see you today.  I have refilled the medication(s) we provide.   If labs were collected or images ordered, we will inform you of  results once we have received them and reviewed. We will contact you either by echart message, or telephone call.  Please give ample time to the testing facility, and our office to run,  receive and review results. Please do not call inquiring of results, even if you can see them in your chart. We will contact you as soon as we are able. If it has been over 1 week since the test was completed, and you have not yet heard from us , then please call us .    - echart message- for normal results that have been seen by the patient already.   - telephone call: abnormal results or if patient has not viewed results in their echart.  If a referral to a specialist was entered for you, please call us  in 2 weeks if you have not heard from the specialist office to schedule.

## 2023-07-25 ENCOUNTER — Ambulatory Visit: Attending: Cardiology | Admitting: *Deleted

## 2023-07-25 DIAGNOSIS — Z5181 Encounter for therapeutic drug level monitoring: Secondary | ICD-10-CM | POA: Diagnosis not present

## 2023-07-25 DIAGNOSIS — I4891 Unspecified atrial fibrillation: Secondary | ICD-10-CM

## 2023-07-25 LAB — POCT INR: INR: 2.7 (ref 2.0–3.0)

## 2023-07-25 NOTE — Patient Instructions (Addendum)
 Description   Continue taking Warfarin 1 tablet everyday except 1/2 tablet on Sundays.  Stay consistent with greens (3 per week).  Recheck INR in 8 weeks.  # [phone] Coumadin  clinic. Main # [phone].

## 2023-08-13 NOTE — Telephone Encounter (Signed)
 Erroneous encounter

## 2023-08-14 NOTE — Progress Notes (Signed)
 Electrophysiology Office Note:   Date:  08/15/2023  ID:  Jennifer, House [birth year], MRN [id number]  Primary Cardiologist: None Electrophysiologist: Ardeen Kohler, MD      History of Present Illness:   Jennifer House is a 78 y.o. female with h/o HTN, HLD, hypothyroidism, obesity, and permanent Afib who is being seen today for to re-establish care with MD.   Discussed the use of AI scribe software for clinical note transcription with the patient, who gave verbal consent to proceed.  History of Present Illness Jennifer House is a 78 year old female with atrial fibrillation who presents for a routine checkup. She is accompanied by family who assists with her medical care. She has chronic atrial fibrillation and remains in atrial fibrillation continuously with controlled rates. She experiences no symptoms related to her atrial fibrillation and is on warfarin therapy to prevent stroke. Her INR levels are consistently within the therapeutic range. No issues with warfarin, such as bleeding or falls. She experiences occasional leg swelling, which she manages with a diuretic taken three times a week on Monday, Wednesday, and Friday. She also takes potassium supplements on the days she takes the diuretic. No shortness of breath or increased fatigue with activities. No new or acute complaints today.  Review of systems complete and found to be negative unless listed in HPI.   EP Information / Studies Reviewed:    EKG is ordered today. Personal review as below. Atrial fibrillation with controlled ventricular rates.     Echo 08/08/23:   1. Left ventricular ejection fraction, by estimation, is 55 to 60%. The  left ventricle has normal function. The left ventricle has no regional  wall motion abnormalities. Left ventricular diastolic function could not  be evaluated.   2. Right ventricular systolic function is normal. The right ventricular  size is normal. There is normal pulmonary  artery systolic pressure.   3. Left atrial size was severely dilated.   4. Right atrial size was severely dilated.   5. The mitral valve is normal in structure. Mild mitral valve  regurgitation. No evidence of mitral stenosis.   6. Tricuspid valve regurgitation is moderate.   7. The aortic valve is tricuspid. Aortic valve regurgitation is mild. No  aortic stenosis is present.   8. The inferior vena cava is normal in size with greater than 50%  respiratory variability, suggesting right atrial pressure of 3 mmHg.    Physical Exam:   VS:  BP 138/64   Pulse 61   Ht 5\' 2"  (1.575 m)   Wt 197 lb (89.4 kg)   SpO2 96%   BMI 36.03 kg/m    Wt Readings from Last 3 Encounters:  08/15/23 197 lb (89.4 kg)  07/22/23 208 lb 12.8 oz (94.7 kg)  02/11/23 203 lb 9.6 oz (92.4 kg)     GEN: Well nourished, well developed in no acute distress NECK: No JVD CARDIAC: Normal rate, irregular rhythm.  RESPIRATORY:  Clear to auscultation without rales, wheezing or rhonchi  ABDOMEN: Soft, non-distended EXTREMITIES:  No edema; No deformity   ASSESSMENT AND PLAN:    #. Permanent atrial fibrillation: Asymptomatic. Controlled rates. #. Secondary hypercoagulable state due to atrial fibrillation: CHADSVASC score of 4.  -Continue warfarin,  monitored and managed by our coumadin  clinic  -Continue metoprolol  tartrate 25mg  BID.    #Hypertension -At goal today.  Recommend checking blood pressures 1-2 times per week at home and recording the values.  Recommend bringing these recordings to the primary  care physician.   #. HLD - Lipids are monitored and managed with her PCP.   #. Lower extremity edema: Likely combination of diastolic HF and venous insufficiency. Managed well with lasix .  - Continue lasix  as ordered by PCP, supplement potassium on lasix  days.    Follow up with Dr. Daneil Dunker in 12 months, sooner if needed.  Signed, Ardeen Kohler, MD

## 2023-08-15 ENCOUNTER — Ambulatory Visit: Attending: Cardiology | Admitting: Cardiology

## 2023-08-15 ENCOUNTER — Encounter: Payer: Self-pay | Admitting: Cardiology

## 2023-08-15 VITALS — BP 138/64 | HR 61 | Ht 62.0 in | Wt 197.0 lb

## 2023-08-15 DIAGNOSIS — R600 Localized edema: Secondary | ICD-10-CM

## 2023-08-15 DIAGNOSIS — D6869 Other thrombophilia: Secondary | ICD-10-CM

## 2023-08-15 DIAGNOSIS — I10 Essential (primary) hypertension: Secondary | ICD-10-CM | POA: Diagnosis not present

## 2023-08-15 DIAGNOSIS — I4821 Permanent atrial fibrillation: Secondary | ICD-10-CM

## 2023-08-15 DIAGNOSIS — E785 Hyperlipidemia, unspecified: Secondary | ICD-10-CM | POA: Diagnosis not present

## 2023-09-18 ENCOUNTER — Ambulatory Visit: Attending: Cardiology | Admitting: *Deleted

## 2023-09-18 DIAGNOSIS — I4891 Unspecified atrial fibrillation: Secondary | ICD-10-CM | POA: Diagnosis not present

## 2023-09-18 DIAGNOSIS — Z5181 Encounter for therapeutic drug level monitoring: Secondary | ICD-10-CM

## 2023-09-18 LAB — POCT INR: POC INR: 2.4

## 2023-09-18 NOTE — Patient Instructions (Signed)
 Description   Continue taking Warfarin 1 tablet everyday except 1/2 tablet on Sundays.  Stay consistent with greens (3 per week).  Recheck INR in 8 weeks.  # [phone] Coumadin  clinic. Main # [phone].

## 2023-09-18 NOTE — Progress Notes (Signed)
Please see anticoagulation encounter.

## 2023-11-13 ENCOUNTER — Ambulatory Visit: Attending: Cardiology | Admitting: Pharmacist

## 2023-11-13 ENCOUNTER — Encounter

## 2023-11-13 DIAGNOSIS — I4891 Unspecified atrial fibrillation: Secondary | ICD-10-CM

## 2023-11-13 DIAGNOSIS — D6869 Other thrombophilia: Secondary | ICD-10-CM | POA: Diagnosis not present

## 2023-11-13 LAB — POCT INR: INR: 2 (ref 2.0–3.0)

## 2023-11-13 NOTE — Patient Instructions (Signed)
 Description   Continue taking Warfarin 1 tablet everyday except 1/2 tablet on Sundays.  Stay consistent with greens (3 per week).  Recheck INR in 8 weeks.  # [phone] Coumadin  clinic. Main # [phone].

## 2023-11-13 NOTE — Progress Notes (Signed)
 INR 2.0  Please see anticoagulation encounter   Description   Continue taking Warfarin 1 tablet everyday except 1/2 tablet on Sundays.  Stay consistent with greens (3 per week).  Recheck INR in 8 weeks.  # [phone] Coumadin  clinic. Main # [phone].

## 2024-01-06 ENCOUNTER — Encounter: Payer: Self-pay | Admitting: Family Medicine

## 2024-01-06 ENCOUNTER — Ambulatory Visit: Admitting: Family Medicine

## 2024-01-06 VITALS — BP 116/78 | HR 69 | Temp 97.9°F | Wt 210.6 lb

## 2024-01-06 DIAGNOSIS — M4724 Other spondylosis with radiculopathy, thoracic region: Secondary | ICD-10-CM

## 2024-01-06 DIAGNOSIS — I10 Essential (primary) hypertension: Secondary | ICD-10-CM

## 2024-01-06 DIAGNOSIS — I482 Chronic atrial fibrillation, unspecified: Secondary | ICD-10-CM

## 2024-01-06 DIAGNOSIS — Z23 Encounter for immunization: Secondary | ICD-10-CM

## 2024-01-06 DIAGNOSIS — M8589 Other specified disorders of bone density and structure, multiple sites: Secondary | ICD-10-CM

## 2024-01-06 DIAGNOSIS — G8929 Other chronic pain: Secondary | ICD-10-CM | POA: Diagnosis not present

## 2024-01-06 DIAGNOSIS — I700 Atherosclerosis of aorta: Secondary | ICD-10-CM

## 2024-01-06 DIAGNOSIS — F5101 Primary insomnia: Secondary | ICD-10-CM

## 2024-01-06 DIAGNOSIS — Z8551 Personal history of malignant neoplasm of bladder: Secondary | ICD-10-CM

## 2024-01-06 DIAGNOSIS — M47816 Spondylosis without myelopathy or radiculopathy, lumbar region: Secondary | ICD-10-CM | POA: Diagnosis not present

## 2024-01-06 DIAGNOSIS — E213 Hyperparathyroidism, unspecified: Secondary | ICD-10-CM

## 2024-01-06 DIAGNOSIS — E785 Hyperlipidemia, unspecified: Secondary | ICD-10-CM

## 2024-01-06 DIAGNOSIS — I2510 Atherosclerotic heart disease of native coronary artery without angina pectoris: Secondary | ICD-10-CM

## 2024-01-06 DIAGNOSIS — E063 Autoimmune thyroiditis: Secondary | ICD-10-CM | POA: Diagnosis not present

## 2024-01-06 DIAGNOSIS — E559 Vitamin D deficiency, unspecified: Secondary | ICD-10-CM | POA: Diagnosis not present

## 2024-01-06 DIAGNOSIS — M4316 Spondylolisthesis, lumbar region: Secondary | ICD-10-CM | POA: Diagnosis not present

## 2024-01-06 DIAGNOSIS — D6869 Other thrombophilia: Secondary | ICD-10-CM

## 2024-01-06 DIAGNOSIS — R319 Hematuria, unspecified: Secondary | ICD-10-CM

## 2024-01-06 DIAGNOSIS — F330 Major depressive disorder, recurrent, mild: Secondary | ICD-10-CM

## 2024-01-06 LAB — COMPREHENSIVE METABOLIC PANEL WITH GFR
ALT: 11 U/L (ref 0–35)
AST: 10 U/L (ref 0–37)
Albumin: 4.3 g/dL (ref 3.5–5.2)
Alkaline Phosphatase: 79 U/L (ref 39–117)
BUN: 12 mg/dL (ref 6–23)
CO2: 31 meq/L (ref 19–32)
Calcium: 9.2 mg/dL (ref 8.4–10.5)
Chloride: 103 meq/L (ref 96–112)
Creatinine, Ser: 0.85 mg/dL (ref 0.40–1.20)
GFR: 64.74 mL/min (ref >60.00)
Glucose, Bld: 105 mg/dL — ABNORMAL HIGH (ref 70–99)
Potassium: 4.3 meq/L (ref 3.5–5.1)
Sodium: 141 meq/L (ref 135–145)
Total Bilirubin: 0.8 mg/dL (ref 0.2–1.2)
Total Protein: 6.7 g/dL (ref 6.0–8.3)

## 2024-01-06 LAB — URINALYSIS, ROUTINE W REFLEX MICROSCOPIC
Bilirubin Urine: NEGATIVE
Hgb urine dipstick: NEGATIVE
Ketones, ur: NEGATIVE
Nitrite: NEGATIVE
RBC / HPF: NONE SEEN (ref 0–?)
Specific Gravity, Urine: 1.015 (ref 1.000–1.030)
Urine Glucose: NEGATIVE
Urobilinogen, UA: 0.2 (ref 0.0–1.0)
pH: 7 (ref 5.0–8.0)

## 2024-01-06 LAB — LIPID PANEL
Cholesterol: 133 mg/dL (ref 0–200)
HDL: 50.1 mg/dL (ref >39.00)
LDL Cholesterol: 57 mg/dL (ref 0–99)
NonHDL: 83.25
Total CHOL/HDL Ratio: 3
Triglycerides: 133 mg/dL (ref 0.0–149.0)
VLDL: 26.6 mg/dL (ref 0.0–40.0)

## 2024-01-06 LAB — TSH: TSH: 3.78 u[IU]/mL (ref 0.35–5.50)

## 2024-01-06 LAB — CBC
HCT: 39.4 % (ref 36.0–46.0)
Hemoglobin: 12.7 g/dL (ref 12.0–15.0)
MCHC: 32.3 g/dL (ref 30.0–36.0)
MCV: 80.4 fl (ref 78.0–100.0)
Platelets: 186 K/uL (ref 150.0–400.0)
RBC: 4.9 Mil/uL (ref 3.87–5.11)
RDW: 15.8 % — ABNORMAL HIGH (ref 11.5–15.5)
WBC: 6.2 K/uL (ref 4.0–10.5)

## 2024-01-06 LAB — T4, FREE: Free T4: 1.03 ng/dL (ref 0.60–1.60)

## 2024-01-06 LAB — VITAMIN D 25 HYDROXY (VIT D DEFICIENCY, FRACTURES): VITD: 49.59 ng/mL (ref 30.00–100.00)

## 2024-01-06 LAB — HEMOGLOBIN A1C: Hgb A1c MFr Bld: 6.3 % (ref 4.6–6.5)

## 2024-01-06 MED ORDER — POTASSIUM CHLORIDE CRYS ER 20 MEQ PO TBCR
EXTENDED_RELEASE_TABLET | ORAL | 5 refills | Status: AC
Start: 2024-01-06 — End: ?

## 2024-01-06 MED ORDER — ESCITALOPRAM OXALATE 10 MG PO TABS: 10.0000 mg | ORAL_TABLET | Freq: Every day | ORAL | 1 refills | Status: AC

## 2024-01-06 MED ORDER — EZETIMIBE 10 MG PO TABS: ORAL_TABLET | ORAL | 3 refills | Status: AC

## 2024-01-06 MED ORDER — AMLODIPINE BESYLATE 10 MG PO TABS: 10.0000 mg | ORAL_TABLET | Freq: Every day | ORAL | 1 refills | Status: AC

## 2024-01-06 MED ORDER — BENAZEPRIL HCL 20 MG PO TABS: ORAL_TABLET | ORAL | 1 refills | Status: AC

## 2024-01-06 MED ORDER — HYDROCODONE-ACETAMINOPHEN 5-325 MG PO TABS: 1.0000 | ORAL_TABLET | Freq: Two times a day (BID) | ORAL | 0 refills | Status: AC | PRN

## 2024-01-06 MED ORDER — FUROSEMIDE 20 MG PO TABS: ORAL_TABLET | ORAL | 1 refills | Status: AC

## 2024-01-06 MED ORDER — PAROXETINE HCL 20 MG PO TABS: ORAL_TABLET | ORAL | 1 refills | Status: AC

## 2024-01-06 MED ORDER — METOPROLOL TARTRATE 50 MG PO TABS: 25.0000 mg | ORAL_TABLET | Freq: Two times a day (BID) | ORAL | 1 refills | Status: AC

## 2024-01-06 MED ORDER — TRAZODONE HCL 100 MG PO TABS: ORAL_TABLET | ORAL | 1 refills | Status: AC

## 2024-01-06 MED ORDER — ATORVASTATIN CALCIUM 20 MG PO TABS: 20.0000 mg | ORAL_TABLET | Freq: Every day | ORAL | 3 refills | Status: AC

## 2024-01-06 NOTE — Progress Notes (Signed)
 Jennifer House , 1945/04/03, 78 y.o., female MRN: [id number] Patient Care Team    Relationship Specialty Notifications Start End  Catherine Charlies LABOR, DO PCP - General Family Medicine  01/23/15   Rudy Greig GAILS, OD  Optometry  08/25/15    Comment: [facility] at [facility]  Gastroenterology, [place]    01/06/24    Comment: Prior Dr. Donnald patient  Pa, [facility]    01/06/24    Comment: Prior Dr. Alline patient  Kennyth Chew, MD Consulting Physician Cardiology  01/06/24     Chief Complaint  Patient presents with   Chronic condition management    Pt is fasting.  Influenza vaccine- given     Subjective:Jennifer House is a 78 y.o. female present for chronic condition management.All past medical history, surgical history, allergies, family history, immunizations and social history was obtained from the patient today and entered into the electronic medical record.  Hypertension/hyperlipidemia/hypertriglyceridemia/A. fib: Pt reports compliance with Benzapril 30 mg, amlodipine  10 mg, metoprolol  25 mg twice a day, Lasix  20 mg (MWF) today.  Patient denies chest pain, shortness of breath, dizziness or lower extremity edema.   Pt is on warfarin therapy per cardiology. Pt is prescribed Zetia  and tolerating statin.  Diet: Low-sodium Exercise: Tries to exercise  RF: Hypertension, hyperlipidemia, family history of heart disease and stroke, personal history A. Fib, obesity, former smoker  Depression/insomnia: Patient reports compliance with Paxil  20 mg,Lexapro  10 mg and trazodone  100 mg .  She feels this regimen is working well for he her. Her husband passed away 01-05-21 and her daughter has been helping her/emotional support.      Encounter for chronic pain/lumbar arthritis: tramadol  was not effective.   Vicodin is working well for her and helping her remain active.  She does not routinely take the daytime dose, but does take a few times a week.  She takes medication  only when she really needs it- uses tylenol  most days. 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     01/06/2024    8:32 AM 07/22/2023    8:41 AM 02/26/2023   11:02 AM  Depression screen PHQ 2/9  Decreased Interest 1 0 0  Down, Depressed, Hopeless 0 0 0  PHQ - 2 Score 1 0 0  Altered sleeping 0 1 0  Tired, decreased energy 1 1 0  Change in appetite 0 0 0  Feeling bad or failure about yourself  0 0 0  Trouble concentrating 1 0 3  Moving slowly or fidgety/restless 0 0 0  Suicidal thoughts 0 0 0  PHQ-9 Score 3 2 3   Difficult doing work/chores Not difficult at all Not difficult at all Not difficult at all      02/27/2022    8:25 AM 08/21/2022   11:04 AM 02/26/2023   10:55 AM 07/22/2023    8:41 AM 01/06/2024    8:32 AM  Fall Risk  Falls in the past year? 0 1 0 0 0  Was there an injury with Fall? 0 0 0  0  Fall Risk Category Calculator 0 1 0  0  Fall Risk Category (Retired) Low       (  RETIRED) Patient Fall Risk Level Low fall risk       Patient at Risk for Falls Due to No Fall Risks No Fall Risks Impaired balance/gait    Fall risk Follow up Falls evaluation completed  Falls evaluation completed Falls evaluation completed;Education provided;Falls prevention discussed Falls evaluation completed Falls evaluation completed     Data saved with a previous flowsheet row definition        01/06/2024    8:32 AM 07/22/2023    8:41 AM 02/26/2023   11:02 AM 02/04/2023    9:16 AM 08/21/2022   11:05 AM  Depression screen PHQ 2/9  Decreased Interest 1 0 0 1 1  Down, Depressed, Hopeless 0 0 0 0 1  PHQ - 2 Score 1 0 0 1 2  Altered sleeping 0 1 0 0 2  Tired,  decreased energy 1 1 0 1 2  Change in appetite 0 0 0 0 0  Feeling bad or failure about yourself  0 0 0 3 0  Trouble concentrating 1 0 3 0 0  Moving slowly or fidgety/restless 0 0 0 0 0  Suicidal thoughts 0 0 0 0 0  PHQ-9 Score 3 2 3 5 6   Difficult doing work/chores Not difficult at all Not difficult at all Not difficult at all Not difficult at all     Allergies  Allergen Reactions   Ibandronate Sodium Other (See Comments)    Bones hurt   Pravachol [Pravastatin Sodium] Other (See Comments)    Myalgias    Nsaids     anticoagulated   Ultram  [Tramadol  Hcl] Nausea Only   Social History   Social History Narrative   Lives in [place], with husband [place].   Has 4 adult children , highest level of education is seventh grade.   Former smoker, denies recreational drugs or alcohol use.   Patient drink caffeine beverages, takes a daily vitamin   Patient was her seatbelt , she has dentures , she has a smoke detector in the home , there are guns in the home a  Locked case    Patient feels safe in her relationships       Past Medical History:  Diagnosis Date   AKI (acute kidney injury) 10/19/2020   Anxiety    Atrial fibrillation (HCC)    longstanding persistent   Carcinoma of bladder (HCC) 2000   transitional cell hx; Dr. Alline   Closed displaced fracture of left femoral neck (HCC) 08/28/2017   Colon polyps    Depression    GERD (gastroesophageal reflux disease)    Headache(784.0)    hx of migraines   Hematuria    hx   HLD (hyperlipidemia)    mixed   HTN (hypertension)    Hypothyroidism    Insomnia    Liver function test abnormality    hx fatty liver   Lobar pneumonia 10/19/2020   Medicare annual wellness visit, subsequent 08/25/2015   Murmur, cardiac    Nonalcoholic fatty liver disease    Obesity    Osteoarthritis    low back pain with left sciatica   Osteopenia    dexa 2013   Renal calculus    hx   RENAL CALCULUS, HX OF 01/31/2009   Qualifier: Diagnosis of  By: Lawernce  CMA, Jewel     Restless leg syndrome    Past Surgical History:  Procedure Laterality Date   APPENDECTOMY     CARDIAC CATHETERIZATION  1/12   revealed normal cors and LV function  COLONOSCOPY     cystocopy  10/17/03   left stent placement, TURBT   FOOT SURGERY     LUMBAR LAMINECTOMY/DECOMPRESSION MICRODISCECTOMY N/A 01/19/2013   Procedure: LUMBAR THREE FOUR LUMBAR LAMINECTOMY/DECOMPRESSION MICRODISCECTOMY 1 LEVEL;  Surgeon: Darina MALVA Boehringer, MD;  Location: [facility];  Service: Neurosurgery;  Laterality: N/A;   TOTAL ABDOMINAL HYSTERECTOMY  1985   TOTAL HIP ARTHROPLASTY Left 08/28/2017   Procedure: TOTAL HIP ARTHROPLASTY ANTERIOR APPROACH;  Surgeon: Fidel Rogue, MD;  Location: [facility] OR;  Service: Orthopedics;  Laterality: Left;   Family History  Problem Relation Age of Onset   Heart failure Mother    Heart attack Mother    Hypertension Mother    Thyroid  cancer Mother    Stroke Father    Hypertension Father    Stroke Brother    Breast cancer Neg Hx    Colon cancer Neg Hx    Allergies as of 01/06/2024       Reactions   Ibandronate Sodium Other (See Comments)   Bones hurt   Pravachol [pravastatin Sodium] Other (See Comments)   Myalgias   Nsaids    anticoagulated   Ultram  [tramadol  Hcl] Nausea Only        Medication List        Accurate as of January 06, 2024  2:14 PM. If you have any questions, ask your nurse or doctor.          STOP taking these medications    gabapentin  100 MG capsule Commonly known as: NEURONTIN  Stopped by: Charlies Bellini       TAKE these medications    amLODipine  10 MG tablet Commonly known as: NORVASC  Take 1 tablet (10 mg total) by mouth daily.   atorvastatin  20 MG tablet Commonly known as: LIPITOR Take 1 tablet (20 mg total) by mouth daily.   benazepril  20 MG tablet Commonly known as: LOTENSIN  TAKE 1 AND 1/2 TABLETS(30 MG) BY MOUTH DAILY   calcium  carbonate 1500 (600 Ca) MG Tabs tablet Commonly known as: OSCAL Take 3  tablets by mouth daily.   Centrum Silver tablet Take 1 tablet by mouth daily.   cholecalciferol 1000 units tablet Commonly known as: VITAMIN D  Take 1,000 Units by mouth daily.   cyanocobalamin  1000 MCG tablet Commonly known as: VITAMIN B12 Take 1,000 mcg by mouth daily.   escitalopram  10 MG tablet Commonly known as: LEXAPRO  Take 1 tablet (10 mg total) by mouth daily.   ezetimibe  10 MG tablet Commonly known as: ZETIA  TAKE 1 TABLET(10 MG) BY MOUTH DAILY   furosemide  20 MG tablet Commonly known as: LASIX  TAKE 1 TABLET BY MOUTH EVERY MONDAY, WEDNESDAY AND FRIDAY   HYDROcodone -acetaminophen  5-325 MG tablet Commonly known as: NORCO/VICODIN Take 1 tablet by mouth 2 (two) times daily as needed for moderate pain (pain score 4-6). What changed: Another medication with the same name was changed. Make sure you understand how and when to take each. Changed by: Elijha Dedman   HYDROcodone -acetaminophen  5-325 MG tablet Commonly known as: NORCO/VICODIN Take 1 tablet by mouth 2 (two) times daily as needed for moderate pain (pain score 4-6). Start taking on: March 23, 2024 What changed: These instructions start on March 23, 2024. If you are unsure what to do until then, ask your doctor or other care provider. Changed by: Salote Weidmann   levothyroxine  75 MCG tablet Commonly known as: SYNTHROID  TAKE 1 TABLET(75 MCG) BY MOUTH DAILY. Monday- Saturday. 1.5 tabs on Sunday.   metoprolol  tartrate 50 MG tablet Commonly known  as: LOPRESSOR  Take 0.5 tablets (25 mg total) by mouth 2 (two) times daily.   nitroGLYCERIN  0.4 MG SL tablet Commonly known as: NITROSTAT  PLACE 1 TABLET UNDER THE TONGUE EVERY 5 MINUTES AS NEEDED FOR CHEST PAIN,(MAX 3 TABLETS)   PARoxetine  20 MG tablet Commonly known as: PAXIL  TAKE 1 TABLET(20 MG) BY MOUTH DAILY   potassium chloride  SA 20 MEQ tablet Commonly known as: KLOR-CON  M 1 tab p.o. on days taking Lasix    traZODone  100 MG tablet Commonly known as:  DESYREL  TAKE 1 TABLET(100 MG) BY MOUTH AT BEDTIME   VITAMIN C PO Take by mouth.   warfarin 5 MG tablet Commonly known as: COUMADIN  Take as directed by the anticoagulation clinic. If you are unsure how to take this medication, talk to your nurse or doctor. Original instructions: TAKE 1 TABLET BY MOUTH DAILY AS DIRECTED BY COUMADIN  CLINIC        All past medical history, surgical history, allergies, family history, immunizations andmedications were updated in the EMR today and reviewed under the history and medication portions of their EMR.     ROS: Negative, with the exception of above mentioned in HPI   Objective:  BP 116/78   Pulse 69   Temp 97.9 F (36.6 C)   Wt 210 lb 9.6 oz (95.5 kg)   SpO2 96%   BMI 38.52 kg/m  Body mass index is 38.52 kg/m. Physical Exam Vitals and nursing note reviewed.  Constitutional:      General: She is not in acute distress.    Appearance: Normal appearance. She is not ill-appearing, toxic-appearing or diaphoretic.  HENT:     Head: Normocephalic and atraumatic.     Mouth/Throat:     Mouth: Mucous membranes are moist.  Eyes:     General: No scleral icterus.       Right eye: No discharge.        Left eye: No discharge.     Extraocular Movements: Extraocular movements intact.     Conjunctiva/sclera: Conjunctivae normal.     Pupils: Pupils are equal, round, and reactive to light.  Cardiovascular:     Rate and Rhythm: Normal rate and regular rhythm.     Heart sounds: No murmur heard. Pulmonary:     Effort: Pulmonary effort is normal. No respiratory distress.     Breath sounds: Normal breath sounds. No wheezing, rhonchi or rales.  Abdominal:     General: Bowel sounds are normal. There is no distension.     Palpations: Abdomen is soft.     Tenderness: There is no abdominal tenderness. There is no right CVA tenderness, left CVA tenderness or rebound.  Musculoskeletal:     Cervical back: Neck supple.     Right lower leg: No edema.      Left lower leg: No edema.  Skin:    General: Skin is warm.     Findings: No rash.  Neurological:     Mental Status: She is alert and oriented to person, place, and time. Mental status is at baseline.     Motor: No weakness.     Gait: Gait normal.  Psychiatric:        Mood and Affect: Mood normal.        Behavior: Behavior normal.        Thought Content: Thought content normal.        Judgment: Judgment normal.     No results found for this or any previous visit (from the past 48 hours).  Assessment/Plan: Jennifer House is a 78 y.o. female present for OV for chronic medical conditions management Arthritis, lumbar spine/Osteoarthritis of thoracic spine, unspecified spinal osteoarthritis complication status/Spondylolisthesis at L3-L4 level/Encounter for chronic pain Stable Medication has increased her quality of life and is able to keep her active.  - Chronic pain script: continue  hydrocodone  5-325 mg BID PRn  #180 supplied.  - UDS-collected today - contract signed  - [place]  controlled substance [place] reviewed 01/06/24 - dc'd gaba - Continue Vicodin 5-325 twice daily as needed x 2 scripts   Essential hypertension, benign/hyperlipidemia/A. Fib (HCC)/morbid obesity/Aortic atherosclerosis (HCC)/acquired thrombophilia/morbid/atherosclerosis of coronary artery obesity Stable Continue benzapril to 30 mg daily Continue amlodipine  10  Continue Lasix  MWF Continue metoprolol  25 BID  Continue Zetia  10 mg daily Continue e statin- tolerating.  - Continue follow-up with cardiology for atrial fibrillation and coumadin  management - low salt diet, increase exercise.  Labs collected today  Predm A1c>6.1>  5.8> collected today  Hypothyroid: Continue levothyroxine  75mcg daily, with an extra half a tablet on Sundays. refills will be provided in appropriate dose based on lab result today TSH, t4 free collected  Hypocalcemia/Osteopenia, unspecified  location/hyperparathyroidism/vitamin D  deficiency - takes vit d 1000u daily. Last dexa 2013 - under the care of  endocrine.  -Vitamin D  levels-collected  Insomnia, unspecified type/ Moderate episode of recurrent major depressive disorder (HCC) Stable Continue trazodone  100 mg nightly Continue lexapro  10 mg daily Continue Paxil  to 20 mg daily (Dc'd gaba)  Hematuria/History of bladder cancer - urinalysis   Influenza vaccine: provided today  Return in about 24 weeks (around 06/22/2024) for Routine chronic condition follow-up.  Reviewed expectations re: course of current medical issues. Discussed self-management of symptoms. Outlined signs and symptoms indicating need for more acute intervention. Patient verbalized understanding and all questions were answered. Patient received an After-Visit Summary.    Orders Placed This Encounter  Procedures   Flu vaccine HIGH DOSE PF(Fluzone Trivalent)   CBC   Comp Met (CMET)   TSH   T4, free   Vitamin D  (25 hydroxy)   PTH, Intact and Calcium    Lipid panel   Hemoglobin A1c   Drug Monitoring Panel [phone] , Urine   Urinalysis, Routine w reflex microscopic    Meds ordered this encounter  Medications   amLODipine  (NORVASC ) 10 MG tablet    Sig: Take 1 tablet (10 mg total) by mouth daily.    Dispense:  90 tablet    Refill:  1   atorvastatin  (LIPITOR) 20 MG tablet    Sig: Take 1 tablet (20 mg total) by mouth daily.    Dispense:  90 tablet    Refill:  3   benazepril  (LOTENSIN ) 20 MG tablet    Sig: TAKE 1 AND 1/2 TABLETS(30 MG) BY MOUTH DAILY    Dispense:  135 tablet    Refill:  1   ezetimibe  (ZETIA ) 10 MG tablet    Sig: TAKE 1 TABLET(10 MG) BY MOUTH DAILY    Dispense:  90 tablet    Refill:  3   furosemide  (LASIX ) 20 MG tablet    Sig: TAKE 1 TABLET BY MOUTH EVERY MONDAY, WEDNESDAY AND FRIDAY    Dispense:  36 tablet    Refill:  1   HYDROcodone -acetaminophen  (NORCO/VICODIN) 5-325 MG tablet    Sig: Take 1 tablet by mouth 2 (two)  times daily as needed for moderate pain (pain score 4-6).    Dispense:  180 tablet    Refill:  0  metoprolol  tartrate (LOPRESSOR ) 50 MG tablet    Sig: Take 0.5 tablets (25 mg total) by mouth 2 (two) times daily.    Dispense:  90 tablet    Refill:  1   PARoxetine  (PAXIL ) 20 MG tablet    Sig: TAKE 1 TABLET(20 MG) BY MOUTH DAILY    Dispense:  90 tablet    Refill:  1   potassium chloride  SA (KLOR-CON  M) 20 MEQ tablet    Sig: 1 tab p.o. on days taking Lasix     Dispense:  30 tablet    Refill:  5   traZODone  (DESYREL ) 100 MG tablet    Sig: TAKE 1 TABLET(100 MG) BY MOUTH AT BEDTIME    Dispense:  90 tablet    Refill:  1   escitalopram  (LEXAPRO ) 10 MG tablet    Sig: Take 1 tablet (10 mg total) by mouth daily.    Dispense:  90 tablet    Refill:  1   HYDROcodone -acetaminophen  (NORCO/VICODIN) 5-325 MG tablet    Sig: Take 1 tablet by mouth 2 (two) times daily as needed for moderate pain (pain score 4-6).    Dispense:  180 tablet    Refill:  0      Note is dictated utilizing voice recognition software. Although note has been proof read prior to signing, occasional typographical errors still can be missed. If any questions arise, please do not hesitate to call for verification.   electronically signed by:  Charlies Bellini, DO  [facility] [place]

## 2024-01-06 NOTE — Patient Instructions (Addendum)
 Return in about 24 weeks (around 06/22/2024) for Routine chronic condition follow-up.        Great to see you today.  I have refilled the medication(s) we provide.   If labs were collected or images ordered, we will inform you of  results once we have received them and reviewed. We will contact you either by echart message, or telephone call.  Please give ample time to the testing facility, and our office to run,  receive and review results. Please do not call inquiring of results, even if you can see them in your chart. We will contact you as soon as we are able. If it has been over 1 week since the test was completed, and you have not yet heard from us , then please call us .    - echart message- for normal results that have been seen by the patient already.   - telephone call: abnormal results or if patient has not viewed results in their echart.  If a referral to a specialist was entered for you, please call us  in 2 weeks if you have not heard from the specialist office to schedule.

## 2024-01-08 ENCOUNTER — Ambulatory Visit: Payer: Self-pay | Admitting: Family Medicine

## 2024-01-08 ENCOUNTER — Ambulatory Visit: Attending: Cardiology | Admitting: *Deleted

## 2024-01-08 DIAGNOSIS — I4891 Unspecified atrial fibrillation: Secondary | ICD-10-CM

## 2024-01-08 DIAGNOSIS — Z5181 Encounter for therapeutic drug level monitoring: Secondary | ICD-10-CM

## 2024-01-08 LAB — DM TEMPLATE

## 2024-01-08 LAB — DRUG MONITORING PANEL 376104, URINE
Amphetamines: NEGATIVE ng/mL (ref <500)
Barbiturates: NEGATIVE ng/mL (ref <300)
Benzodiazepines: NEGATIVE ng/mL (ref <100)
Cocaine Metabolite: NEGATIVE ng/mL (ref <150)
Desmethyltramadol: NEGATIVE ng/mL (ref <100)
Opiates: NEGATIVE ng/mL (ref <100)
Oxycodone: NEGATIVE ng/mL (ref <100)
Tramadol: NEGATIVE ng/mL (ref <100)

## 2024-01-08 LAB — PTH, INTACT AND CALCIUM
Calcium: 9.5 mg/dL (ref 8.6–10.4)
PTH: 58 pg/mL (ref 16–77)

## 2024-01-08 LAB — POCT INR: POC INR: 1.9

## 2024-01-08 MED ORDER — LEVOTHYROXINE SODIUM 75 MCG PO TABS: ORAL_TABLET | ORAL | 3 refills | Status: AC

## 2024-01-08 NOTE — Patient Instructions (Signed)
 Description   Take 1.5 tablet of warfarin today and then continue taking Warfarin 1 tablet everyday except 1/2 tablet on Sundays.  Stay consistent with greens (3 per week).  Recheck INR in 4 weeks.  # [phone] Coumadin  clinic. Main # [phone].

## 2024-01-08 NOTE — Progress Notes (Signed)
 Lab Results  Component Value Date   INR 1.9 01/08/2024   INR 2.0 11/13/2023   INR 2.4 09/18/2023    Description   Take 1.5 tablet of warfarin today and then continue taking Warfarin 1 tablet everyday except 1/2 tablet on Sundays.  Stay consistent with greens (3 per week).  Recheck INR in 4 weeks.  # [phone] Coumadin  clinic. Main # [phone].

## 2024-02-04 ENCOUNTER — Other Ambulatory Visit: Payer: Self-pay

## 2024-02-06 ENCOUNTER — Ambulatory Visit: Attending: Cardiology

## 2024-02-06 DIAGNOSIS — Z7901 Long term (current) use of anticoagulants: Secondary | ICD-10-CM | POA: Diagnosis not present

## 2024-02-06 DIAGNOSIS — I4891 Unspecified atrial fibrillation: Secondary | ICD-10-CM | POA: Diagnosis not present

## 2024-02-06 LAB — POCT INR
INR: 2.4 (ref 2.0–3.0)
POC INR: 2.4

## 2024-02-06 NOTE — Progress Notes (Addendum)
   Description   Continue taking Warfarin 1 tablet everyday except 1/2 tablet on Sundays.  Stay consistent with greens (3 per week).  Recheck INR in 6 weeks.  # [phone] Coumadin  clinic. Main # [phone].      This SmartLink has not been configured with any valid records.

## 2024-03-15 ENCOUNTER — Other Ambulatory Visit: Payer: Self-pay | Admitting: Physician Assistant

## 2024-03-15 DIAGNOSIS — I4891 Unspecified atrial fibrillation: Secondary | ICD-10-CM

## 2024-03-19 ENCOUNTER — Ambulatory Visit: Attending: Cardiology | Admitting: *Deleted

## 2024-03-19 DIAGNOSIS — Z5181 Encounter for therapeutic drug level monitoring: Secondary | ICD-10-CM | POA: Diagnosis not present

## 2024-03-19 DIAGNOSIS — I4891 Unspecified atrial fibrillation: Secondary | ICD-10-CM

## 2024-03-19 LAB — POCT INR: INR: 2.6 (ref 2.0–3.0)

## 2024-03-19 NOTE — Patient Instructions (Signed)
 Description   INr-2.6; Continue taking Warfarin 1 tablet everyday except 1/2 tablet on Sundays. Stay consistent with greens (3 per week).  Recheck INR in 6 weeks.  # [phone] Coumadin  clinic. Main # [phone].

## 2024-03-19 NOTE — Progress Notes (Signed)
 INR goal 2-3  Description   INr-2.6; Continue taking Warfarin 1 tablet everyday except 1/2 tablet on Sundays. Stay consistent with greens (3 per week).  Recheck INR in 6 weeks.  # [phone] Coumadin  clinic. Main # [phone].

## 2024-04-30 ENCOUNTER — Ambulatory Visit

## 2024-06-22 ENCOUNTER — Ambulatory Visit: Admitting: Family Medicine
